# Patient Record
Sex: Male | Born: 1935 | Race: Black or African American | Hispanic: No | Smoking: Former smoker
Health system: Southern US, Community
[De-identification: ages and names within clinical notes are randomized; demographics above are authoritative.]

## PROBLEM LIST (undated history)

## (undated) DIAGNOSIS — L0390 Cellulitis, unspecified: Secondary | ICD-10-CM

## (undated) DIAGNOSIS — I639 Cerebral infarction, unspecified: Secondary | ICD-10-CM

## (undated) DIAGNOSIS — F0390 Unspecified dementia without behavioral disturbance: Secondary | ICD-10-CM

## (undated) DIAGNOSIS — C61 Malignant neoplasm of prostate: Secondary | ICD-10-CM

## (undated) DIAGNOSIS — M1990 Unspecified osteoarthritis, unspecified site: Secondary | ICD-10-CM

## (undated) DIAGNOSIS — I10 Essential (primary) hypertension: Secondary | ICD-10-CM

## (undated) HISTORY — PX: NECK SURGERY: SHX720

## (undated) HISTORY — PX: OTHER SURGICAL HISTORY: SHX169

## (undated) HISTORY — PX: FOOT SURGERY: SHX648

---

## 1999-05-28 ENCOUNTER — Encounter: Payer: Self-pay | Admitting: Neurosurgery

## 1999-05-29 ENCOUNTER — Inpatient Hospital Stay (HOSPITAL_COMMUNITY): Admission: RE | Admit: 1999-05-29 | Discharge: 1999-05-31 | Payer: Self-pay | Admitting: Neurosurgery

## 1999-05-29 ENCOUNTER — Encounter: Payer: Self-pay | Admitting: Neurosurgery

## 1999-05-29 HISTORY — PX: ANTERIOR CERVICAL DECOMP/DISCECTOMY FUSION: SHX1161

## 1999-05-31 ENCOUNTER — Inpatient Hospital Stay (HOSPITAL_COMMUNITY)
Admission: RE | Admit: 1999-05-31 | Discharge: 1999-06-11 | Payer: Self-pay | Admitting: Physical Medicine and Rehabilitation

## 1999-07-15 ENCOUNTER — Encounter: Admission: RE | Admit: 1999-07-15 | Discharge: 1999-07-15 | Payer: Self-pay | Admitting: Neurosurgery

## 1999-07-15 ENCOUNTER — Encounter: Payer: Self-pay | Admitting: Neurosurgery

## 1999-08-07 ENCOUNTER — Encounter: Admission: RE | Admit: 1999-08-07 | Discharge: 1999-09-13 | Payer: Self-pay | Admitting: Neurosurgery

## 1999-08-29 ENCOUNTER — Encounter: Payer: Self-pay | Admitting: Neurosurgery

## 1999-08-29 ENCOUNTER — Encounter: Admission: RE | Admit: 1999-08-29 | Discharge: 1999-08-29 | Payer: Self-pay | Admitting: Neurosurgery

## 1999-09-27 ENCOUNTER — Ambulatory Visit (HOSPITAL_COMMUNITY): Admission: RE | Admit: 1999-09-27 | Discharge: 1999-09-27 | Payer: Self-pay | Admitting: Neurosurgery

## 1999-09-27 HISTORY — PX: CARPAL TUNNEL RELEASE: SHX101

## 1999-12-08 ENCOUNTER — Emergency Department (HOSPITAL_COMMUNITY): Admission: EM | Admit: 1999-12-08 | Discharge: 1999-12-08 | Payer: Self-pay | Admitting: Emergency Medicine

## 2000-08-12 ENCOUNTER — Ambulatory Visit (HOSPITAL_BASED_OUTPATIENT_CLINIC_OR_DEPARTMENT_OTHER): Admission: RE | Admit: 2000-08-12 | Discharge: 2000-08-12 | Payer: Self-pay | Admitting: *Deleted

## 2000-08-12 HISTORY — PX: CARPAL TUNNEL RELEASE: SHX101

## 2002-01-13 ENCOUNTER — Ambulatory Visit (HOSPITAL_COMMUNITY): Admission: RE | Admit: 2002-01-13 | Discharge: 2002-01-13 | Payer: Self-pay | Admitting: *Deleted

## 2002-01-13 ENCOUNTER — Encounter (INDEPENDENT_AMBULATORY_CARE_PROVIDER_SITE_OTHER): Payer: Self-pay | Admitting: Specialist

## 2002-01-19 ENCOUNTER — Encounter: Payer: Self-pay | Admitting: Internal Medicine

## 2002-01-19 ENCOUNTER — Encounter: Admission: RE | Admit: 2002-01-19 | Discharge: 2002-01-19 | Payer: Self-pay | Admitting: Internal Medicine

## 2002-06-22 ENCOUNTER — Encounter: Payer: Self-pay | Admitting: Emergency Medicine

## 2002-06-22 ENCOUNTER — Emergency Department (HOSPITAL_COMMUNITY): Admission: EM | Admit: 2002-06-22 | Discharge: 2002-06-22 | Payer: Self-pay | Admitting: Emergency Medicine

## 2003-09-27 ENCOUNTER — Ambulatory Visit (HOSPITAL_BASED_OUTPATIENT_CLINIC_OR_DEPARTMENT_OTHER): Admission: RE | Admit: 2003-09-27 | Discharge: 2003-09-27 | Payer: Self-pay | Admitting: Orthopedic Surgery

## 2005-01-23 ENCOUNTER — Encounter: Admission: RE | Admit: 2005-01-23 | Discharge: 2005-01-23 | Payer: Self-pay | Admitting: Internal Medicine

## 2005-05-13 ENCOUNTER — Ambulatory Visit (HOSPITAL_COMMUNITY): Admission: RE | Admit: 2005-05-13 | Discharge: 2005-05-13 | Payer: Self-pay | Admitting: Urology

## 2005-06-13 ENCOUNTER — Ambulatory Visit: Admission: RE | Admit: 2005-06-13 | Discharge: 2005-07-18 | Payer: Self-pay | Admitting: Radiation Oncology

## 2005-06-24 ENCOUNTER — Ambulatory Visit (HOSPITAL_COMMUNITY): Admission: RE | Admit: 2005-06-24 | Discharge: 2005-06-24 | Payer: Self-pay | Admitting: Radiation Oncology

## 2005-07-15 ENCOUNTER — Encounter (INDEPENDENT_AMBULATORY_CARE_PROVIDER_SITE_OTHER): Payer: Self-pay | Admitting: *Deleted

## 2005-07-15 ENCOUNTER — Ambulatory Visit (HOSPITAL_COMMUNITY): Admission: RE | Admit: 2005-07-15 | Discharge: 2005-07-15 | Payer: Self-pay | Admitting: Otolaryngology

## 2005-07-21 ENCOUNTER — Ambulatory Visit: Admission: RE | Admit: 2005-07-21 | Discharge: 2005-10-19 | Payer: Self-pay | Admitting: Radiation Oncology

## 2005-09-02 HISTORY — PX: INSERTION PROSTATE RADIATION SEED: SUR718

## 2005-09-08 ENCOUNTER — Ambulatory Visit (HOSPITAL_BASED_OUTPATIENT_CLINIC_OR_DEPARTMENT_OTHER): Admission: RE | Admit: 2005-09-08 | Discharge: 2005-09-08 | Payer: Self-pay | Admitting: Urology

## 2005-09-09 ENCOUNTER — Emergency Department (HOSPITAL_COMMUNITY): Admission: EM | Admit: 2005-09-09 | Discharge: 2005-09-09 | Payer: Self-pay | Admitting: Emergency Medicine

## 2005-09-27 ENCOUNTER — Encounter: Admission: RE | Admit: 2005-09-27 | Discharge: 2005-09-27 | Payer: Self-pay | Admitting: Internal Medicine

## 2006-05-08 ENCOUNTER — Encounter: Admission: RE | Admit: 2006-05-08 | Discharge: 2006-07-23 | Payer: Self-pay | Admitting: Neurology

## 2006-10-20 ENCOUNTER — Encounter
Admission: RE | Admit: 2006-10-20 | Discharge: 2006-10-20 | Payer: Self-pay | Admitting: Physical Medicine and Rehabilitation

## 2008-09-04 ENCOUNTER — Inpatient Hospital Stay (HOSPITAL_COMMUNITY): Admission: EM | Admit: 2008-09-04 | Discharge: 2008-09-08 | Payer: Self-pay | Admitting: Emergency Medicine

## 2008-09-04 ENCOUNTER — Ambulatory Visit: Payer: Self-pay | Admitting: Cardiology

## 2008-09-05 ENCOUNTER — Encounter (INDEPENDENT_AMBULATORY_CARE_PROVIDER_SITE_OTHER): Payer: Self-pay | Admitting: *Deleted

## 2008-09-05 ENCOUNTER — Ambulatory Visit: Payer: Self-pay | Admitting: Vascular Surgery

## 2008-09-06 ENCOUNTER — Encounter (INDEPENDENT_AMBULATORY_CARE_PROVIDER_SITE_OTHER): Payer: Self-pay | Admitting: *Deleted

## 2008-09-20 ENCOUNTER — Encounter: Payer: Self-pay | Admitting: Internal Medicine

## 2008-09-20 ENCOUNTER — Ambulatory Visit: Payer: Self-pay

## 2008-09-28 DIAGNOSIS — I10 Essential (primary) hypertension: Secondary | ICD-10-CM | POA: Insufficient documentation

## 2008-09-28 DIAGNOSIS — Z8546 Personal history of malignant neoplasm of prostate: Secondary | ICD-10-CM

## 2008-09-29 ENCOUNTER — Ambulatory Visit: Payer: Self-pay | Admitting: Internal Medicine

## 2008-09-29 DIAGNOSIS — R5381 Other malaise: Secondary | ICD-10-CM

## 2008-09-29 DIAGNOSIS — R5383 Other fatigue: Secondary | ICD-10-CM

## 2008-09-29 DIAGNOSIS — I498 Other specified cardiac arrhythmias: Secondary | ICD-10-CM

## 2008-10-03 LAB — CONVERTED CEMR LAB
Calcium: 8.6 mg/dL (ref 8.4–10.5)
GFR calc non Af Amer: 75.54 mL/min (ref >60)
Glucose, Bld: 89 mg/dL (ref 70–99)

## 2008-10-10 ENCOUNTER — Ambulatory Visit (HOSPITAL_BASED_OUTPATIENT_CLINIC_OR_DEPARTMENT_OTHER): Admission: RE | Admit: 2008-10-10 | Discharge: 2008-10-10 | Payer: Self-pay | Admitting: Internal Medicine

## 2008-10-10 ENCOUNTER — Encounter: Payer: Self-pay | Admitting: Pulmonary Disease

## 2008-10-24 ENCOUNTER — Ambulatory Visit: Payer: Self-pay | Admitting: Pulmonary Disease

## 2008-11-16 ENCOUNTER — Telehealth (INDEPENDENT_AMBULATORY_CARE_PROVIDER_SITE_OTHER): Payer: Self-pay | Admitting: *Deleted

## 2008-11-16 DIAGNOSIS — G4730 Sleep apnea, unspecified: Secondary | ICD-10-CM | POA: Insufficient documentation

## 2010-05-25 ENCOUNTER — Encounter: Payer: Self-pay | Admitting: Internal Medicine

## 2010-08-13 LAB — CBC
HCT: 36.9 % — ABNORMAL LOW (ref 39.0–52.0)
HCT: 40.1 % (ref 39.0–52.0)
Hemoglobin: 12.1 g/dL — ABNORMAL LOW (ref 13.0–17.0)
MCHC: 32.8 g/dL (ref 30.0–36.0)
MCHC: 32.9 g/dL (ref 30.0–36.0)
MCV: 79.3 fL (ref 78.0–100.0)
MCV: 79.4 fL (ref 78.0–100.0)
Platelets: 160 10*3/uL (ref 150–400)
RBC: 4.65 MIL/uL (ref 4.22–5.81)
RBC: 5.05 MIL/uL (ref 4.22–5.81)
RDW: 18.7 % — ABNORMAL HIGH (ref 11.5–15.5)
WBC: 5.3 10*3/uL (ref 4.0–10.5)

## 2010-08-13 LAB — BASIC METABOLIC PANEL
BUN: 10 mg/dL (ref 6–23)
BUN: 7 mg/dL (ref 6–23)
CO2: 27 mEq/L (ref 19–32)
Chloride: 104 mEq/L (ref 96–112)
Chloride: 106 mEq/L (ref 96–112)
Creatinine, Ser: 1.14 mg/dL (ref 0.4–1.5)
GFR calc Af Amer: >60 mL/min (ref >60)
Glucose, Bld: 108 mg/dL — ABNORMAL HIGH (ref 70–99)
Potassium: 3.7 mEq/L (ref 3.5–5.1)
Potassium: 3.8 mEq/L (ref 3.5–5.1)

## 2010-08-13 LAB — COMPREHENSIVE METABOLIC PANEL
ALT: 9 U/L (ref 0–53)
AST: 12 U/L (ref 0–37)
Albumin: 2.9 g/dL — ABNORMAL LOW (ref 3.5–5.2)
Alkaline Phosphatase: 61 U/L (ref 39–117)
BUN: 11 mg/dL (ref 6–23)
CO2: 26 mEq/L (ref 19–32)
Calcium: 8.1 mg/dL — ABNORMAL LOW (ref 8.4–10.5)
Chloride: 103 mEq/L (ref 96–112)
Creatinine, Ser: 1.17 mg/dL (ref 0.4–1.5)
GFR calc Af Amer: >60 mL/min (ref >60)
GFR calc non Af Amer: >60 mL/min (ref >60)
Glucose, Bld: 103 mg/dL — ABNORMAL HIGH (ref 70–99)
Potassium: 3.5 mEq/L (ref 3.5–5.1)
Sodium: 135 mEq/L (ref 135–145)
Total Bilirubin: 0.9 mg/dL (ref 0.3–1.2)
Total Protein: 6.5 g/dL (ref 6.0–8.3)

## 2010-08-13 LAB — URINALYSIS, ROUTINE W REFLEX MICROSCOPIC
Bilirubin Urine: NEGATIVE
Glucose, UA: NEGATIVE mg/dL
Hgb urine dipstick: NEGATIVE
Ketones, ur: NEGATIVE mg/dL
Nitrite: NEGATIVE
Protein, ur: NEGATIVE mg/dL
Specific Gravity, Urine: 1.014 (ref 1.005–1.030)
Urobilinogen, UA: 1 mg/dL (ref 0.0–1.0)
pH: 6 (ref 5.0–8.0)

## 2010-08-13 LAB — DIFFERENTIAL
Basophils Absolute: 0.1 10*3/uL (ref 0.0–0.1)
Basophils Relative: 1 % (ref 0–1)
Eosinophils Absolute: 0.1 10*3/uL (ref 0.0–0.7)
Eosinophils Relative: 2 % (ref 0–5)
Lymphocytes Relative: 39 % (ref 12–46)
Lymphs Abs: 2.1 10*3/uL (ref 0.7–4.0)
Monocytes Absolute: 0.4 10*3/uL (ref 0.1–1.0)
Monocytes Relative: 8 % (ref 3–12)
Neutro Abs: 2.7 10*3/uL (ref 1.7–7.7)
Neutrophils Relative %: 50 % (ref 43–77)

## 2010-08-13 LAB — LIPID PANEL
LDL Cholesterol: 122 mg/dL — ABNORMAL HIGH (ref 0–99)
Total CHOL/HDL Ratio: 5.4 RATIO
Triglycerides: 57 mg/dL (ref <150)
VLDL: 11 mg/dL (ref 0–40)

## 2010-08-13 LAB — CARDIAC PANEL(CRET KIN+CKTOT+MB+TROPI): Relative Index: INVALID (ref 0.0–2.5)

## 2010-08-13 LAB — TSH: TSH: 2.263 u[IU]/mL (ref 0.350–4.500)

## 2010-08-13 LAB — CK TOTAL AND CKMB (NOT AT ARMC): Total CK: 51 U/L (ref 7–232)

## 2010-08-13 LAB — APTT: aPTT: 43 seconds — ABNORMAL HIGH (ref 24–37)

## 2010-08-13 LAB — TROPONIN I: Troponin I: 0.01 ng/mL (ref 0.00–0.06)

## 2010-08-13 LAB — PHOSPHORUS: Phosphorus: 3 mg/dL (ref 2.3–4.6)

## 2010-08-13 LAB — BRAIN NATRIURETIC PEPTIDE: Pro B Natriuretic peptide (BNP): 92 pg/mL (ref 0.0–100.0)

## 2010-09-17 NOTE — Discharge Summary (Signed)
NAMERASHIDI, LOH NO.:  [id number]   MEDICAL RECORD NO.:  [id number]          PATIENT TYPE:  INP   LOCATION:  3710                         FACILITY:  [facility]   PHYSICIAN:  Altha Harm, MDDATE OF BIRTH:  10-Jan-1930   DATE OF ADMISSION:  09/04/2008  DATE OF DISCHARGE:  09/08/2008                               DISCHARGE SUMMARY   DISCHARGE DISPOSITION:  Home.   FINAL DISCHARGE DIAGNOSES:  1. Generalized weakness, improved.  2. Uncontrolled hypertension, improving.  3. Bradycardia - likely medication-induced.  4. History of cerebrovascular accident with left-sided weakness in the      past.  5. Gait disturbance.  6. Neuropathy.  7. Benign prostatic hypertrophy.  8. History of prostate cancer.  9. Hyperlipidemia - specifically hypertriglyceridemia.  10.History of brown recluse spider bite in the past.  11.Questionable transient ischemic attack.   DISCHARGE MEDICATIONS:  1. Xanax 0.5 mg p.o. q.8 h.  2. Lisinopril 10 mg p.o. daily.  3. Hydrochlorothiazide 12.5 mg p.o. daily.  4. Norvasc 5 mg p.o. daily  5. Pravastatin 20 mg p.o. q.h.s.  6. Detrol 4 mg p.o. daily.  7. Oxybutynin 10 mg p.o. t.i.d.  8. Gabapentin 800 mg p.o. t.i.d.  9. Aspirin 81 mg p.o. daily.  10.Xanax 0.5 mg p.o. q.8 h.   Discontinued medications include the following:  1. Tenex 2 mg p.o. q.h.s.  2. Metoprolol 50 mg p.o. daily.  3. Tranxene 7.5 mg p.o. daily.   CONSULTANTS:  Dr. Dietrich Pates, cardiology.   PROCEDURES:  None.   DIAGNOSTIC STUDIES:  1. Chest x-ray 2-view done on admission which shows mild cardiomegaly      and tortuous aorta.  No active disease in 1 view.  2. CT of the head without contrast done on admission, which shows no      bleed or mass effect.  The calvarium was intact.  No fluid or      sinuses visualized.  Microvascular white matter changes, several      areas of infarction in the right basil ganglia.  Impression:  No      definite acute findings,  although we have no prior studies      available for comparison.  3. MRI of the brain without contrast, which shows no acute infarct.      Remote right basal ganglia chronic infarct with mild dilatation of      the adjacent ventricle.  Impression:  Moderate small vessel disease-      type changes.  4. MRA of the brain, which shows moderate intracranial atherosclerotic      changes.  5. Carotid duplex, which shows no ICA stenosis.  6. A 2-D echocardiogram, which shows:      a.     Preserved ejection fraction in the range of 60-65% with       increased wall thickness, increased in a pattern of moderate left       ventricular hypertrophy.  Doppler findings consistent with grade 1       systolic dysfunction.      b.     Mitral valve  mildly thickened leaflets.      c.     Left atrium mildly dilated.      d.     Right atrium appears to be extrinsically compressed.      e.     Tricuspid valve with mild regurgitation.      f.     Trivial pericardial effusion identified.   ALLERGIES:  No known drug allergies.   CODE STATUS:  Full code.   PRIMARY CARE PHYSICIAN:  [name], M.D.   CHIEF COMPLAINT:  Generalized weakness.   HISTORY OF PRESENT ILLNESS:  Please refer to the H and P by Dr.  Kathryne Hitch for details of the HPI.  However, in short, this is a  gentleman who according to his family was having progressive generalized  weakness over several weeks.  The patient's daughter stated that while  at home she observed the patient to be looking rather depressed in his  activities and when checking his pulse, she was unable to palpate a  pulse and noted that the blood pressure was elevated.  She called EMS  and the patient was transported to the hospital, where he was admitted  for the same.   HOSPITAL COURSE:  1. Generalized weakness.  The patient had some generalized weakness      and it was difficult to ascertain whether or not he was having some      one-sided weakness given the  fact that the patient does have some      residual weakness from a prior stroke.  He was observed and was not      found to have any new focal deficits and, in fact, radiologic      studies showed no evidence of an acute infarction.  The patient was      also found to be bradycardiac and his rate-limiting medications      were discontinued at that time.  The patient did return to his      baseline of function and with the discontinuation of his rate-      limiting medications.  It was felt that his weakness was likely      caused,  likely the combination of the bradycardia in conjunction      with the systemic defects of the beta and alpha-adrenergic      blockers.  The patient was seen by physical therapy, who felt that      the patient could benefit from ongoing therapy at home.  The      patient states that prior to coming to the hospital he had been      using a cane, however felt that the support he was receiving from      the cane was inadequate.  Here in the hospital the physical      therapist had recommended a rolling walker present and feels that      the patient can likely with therapy gradually to the use of a cane      as an appropriate assistive device.  2. Bradycardia.  As noted above, the patient was found to bradycardic      down into the 30s.  Even with the discontinuation of the beta      blocker and the alpha-adrenergic blocker, the patient still had      heart rates down into the 30s.  Dr. Dietrich Pates from cardiology saw      the patient in consultation and felt that there was no  further      intervention needed at this time except for the discontinuation of      those medications.  Over a period of several days the patient's      heart rate improved up into the 50s to 70s.  A 2-D echocardiogram      was done and showed no structural defects.  The recommendation from      Dr. Tenny Craw was that the patient be seen in consultation for possible      sleep study as an  outpatient.  The patient has an appointment to      follow up with her.  3. Uncontrolled hypertension.  The patient had high blood pressure in      the ranges of 200 systolic to 100 diastolic.  The patient was      started on a new regimen of medications to include lisinopril,      hydrochlorothiazide and Norvasc.  The patient has never been      investigated for secondary causes of his hypertension and will be      seen by Dr. Tenny Craw as an outpatient for renal artery stenosis      evaluation.  If this proves to be negative, a sleep study will be      performed to further evaluate the patient.  With this regimen of      medications the patient has had a moderate decrease in his blood      pressure down into the range of 170s over 90s, and the patient will      likely require further titration as an outpatient.  I would, of      course, allow an acceptable period of time for maximal effect of      these blood pressure medications before titrating the medications      again.  4. Hyperlipidemia.  The patient was found to be hyperlipidemic with      specifically hypertriglyceridemia.  The patient was started on      Zocor in the hospital and is being sent home on pravastatin as an      outpatient.  5. History of coronary artery disease.  The patient has been placed on      an aspirin and statin for independent beneficial effects for a      patient with coronary artery disease.  The patient does not have a      history of a myocardial infarction, and his beta blocker has been      discontinued due to the bradycardia and the effects it has had on      him, both systemically and with his heart rates.  6. Questionable benign prostatic hypertrophy.  The patient is on      oxybutynin and Detrol.  7. Questionable transient ischemic attack.  It is unclear as to      whether or not the patient actually had focal symptoms initially      which resolved or this was just generalized weakness which  improved      as the patient was taken off of the beta blocker.  Nevertheless,      the patient has been risk-modified and did not show any      radiological evidence of a new stroke.  8. Anxiety.  The patient had been on Tranxene for treatment of his      anxiety.  However, he endorsed that it was cost-prohibitive and he      could  not afford the medication.  Thus, Xanax 0.5 mg p.o. t.i.d.      has been substituted for the Tranxene.   DIETARY RESTRICTIONS:  The patient should be on a heart-healthy diet.   PHYSICAL RESTRICTIONS:  The patient should ambulate with a walker and  will receive home health physical therapy as an outpatient.   FOLLOWUP:  The patient is to follow up with Dr. Nicholos Johns in  approximately 1-2 weeks.  The patient also has a followup appointment  scheduled with Dr. Dietrich Pates for 5/28 at 9:45 a.m., and he has an  appointment scheduled for a renal ultrasound to be done on 5/19 at 10  a.m.   TOTAL TIME FOR DISCHARGE PROCESS:  1 hour.      Altha Harm, MD  Electronically Signed     MAM/MEDQ  D:  09/08/2008  T:  09/08/2008  Job:  [id number]

## 2010-09-17 NOTE — Procedures (Signed)
NAMECREG, GILMER NO.:  [id number]   MEDICAL RECORD NO.:  [id number]          PATIENT TYPE:  OUT   LOCATION:  SLEEP CENTER                 FACILITY:  [facility]   PHYSICIAN:  Barbaraann Share, MD,FCCPDATE OF BIRTH:  11[birth year]14[birth year]1931   DATE OF STUDY:  10/10/2008                            NOCTURNAL POLYSOMNOGRAM   REFERRING PHYSICIAN:  [name], MD, [facility]   REFERRING PHYSICIAN:  [name], MD, [facility]   INDICATIONS FOR STUDY:  Hypersomnia with sleep apnea.   EPWORTH SCORE:  9.   SLEEP ARCHITECTURE:  The patient had a total sleep time of 119 minutes  with no slow wave sleep and only 21 minutes of REM.  Sleep onset latency  was normal at 16 minutes, and REM onset was prolonged at 160 minutes.  Sleep efficiency was very poor at 33 minutes.   RESPIRATORY DATA:  The patient was found to have 28 obstructive apneas  and 34 obstructive hypopneas giving him an apnea/hypopnea index of 31  events per hour.  All of the events occurred in the supine position, and  there was moderate snoring noted throughout.  The patient did not meet  split night criteria since most of his events occurred after midnight,  and there was not enough time for appropriate CPAP titration.   OXYGEN DATA:  There was O2 desaturation as low as 82% with the patient's  obstructive events.   CARDIAC DATA:  Rare PAC and PVC, but no clinically significant  arrhythmia seen.   MOVEMENT/PARASOMNIA:  The patient was found to have 104 periodic leg  movements, but only 2 resulted in arousal or awakening.  More than  likely, his leg jerks are due to his sleep disordered breathing.   IMPRESSION/RECOMMENDATIONS:  1. Moderate obstructive sleep apnea/hypopnea syndrome with an AHI of      31 events per hour and oxygen desaturation as low as 82%.  The      patient did not meet split night criteria secondary to most of his      events occurring after midnight.  Treatment for this degree of      sleep apnea can  include a trial of weight loss alone if      applicable, upper airway surgery, dental appliance, and also CPAP.      Clinical correlation is suggested.  2. Rare PAC and PVC with no clinically significant arrhythmia seen.      Barbaraann Share, MD,FCCP  Diplomate, American Board of Sleep  Medicine  Electronically Signed     KMC/MEDQ  D:  10/24/2008 15:42:34  T:  10/25/2008 04:19:18  Job:  [id number]

## 2010-09-17 NOTE — Consult Note (Signed)
Manuel King, Manuel King NO.:  [id number]   MEDICAL RECORD NO.:  [id number]          PATIENT TYPE:  INP   LOCATION:  3710                         FACILITY:  [facility]   PHYSICIAN:  [name], MD, FACCDATE OF BIRTH:  02/03/1930   DATE OF CONSULTATION:  09/07/2008  DATE OF DISCHARGE:                                 CONSULTATION   PRIMARY CARE PHYSICIAN:  [name], MD   PRIMARY CARDIOLOGIST:  New, [name], MD, [facility]   REQUESTING PHYSICIAN:  Dr. Ashley Royalty of [facility].   REASON FOR CONSULTATION:  Bradycardia.   HISTORY OF PRESENT ILLNESS:  A 77 year old African American male with  known history of CVA, hyperlipidemia admitted secondary to generalized  weakness.  The patient states he just could not get out of bed.  He felt  weakness all over, especially in his legs.  The patient's family brought  him to the emergency room.  He was found to be very hypertensive with  blood pressures in the 190 systolic.  The patient was admitted.  He was  diagnosed with a TIA.  He did have a CT scan of his head that showed no  acute infarct.  However, he had a history of remote infarct and moderate  atherosclerosis.  The patient was also found to be bradycardic with a  heart rate in his 30s.  His primary care physician discontinued  metoprolol and Tenex, and we are asked to evaluate further for any more  recommendations.  For blood pressure control, he is now been placed on  Altace 5 mg daily and hydrochlorothiazide 12.5 mg daily.  The patient  denies any syncope, chest pain, or shortness of breath.  He admits that  his hypertension has been difficult to control as an outpatient.  Dr.  Brunilda Payor, his oncologist has been adjusting his medications as well as he  has a history of prostate cancer and has recently placed him on the  Tenex.  The patient states that with several different medication  regimens and none of them seem to have helped his blood pressure, he  has  been medically compliant.   REVIEW OF SYSTEMS:  Positive for generalized weakness, lightheadedness  when he stands up too quickly.  He was unable to stand secondary to the  weakness.  All other systems are reviewed and found to be negative.   PAST MEDICAL HISTORY:  1. Hypertension for 13 years.  2. Hypertriglyceridemia.  3. Prostate cancer, being treated by Dr. Brunilda Payor.  4. History of a CVA in 1997 with left-sided weakness.  5. He has a history of being bitten by a brown recluse spider.  6. He has chronic back pain.  7. He was diagnosed with a parotid gland Warthin tumor.   Echocardiogram dated May 2010 revealing EF of 60%, LVH with grade 1  diastolic dysfunction, and mild MV bowing.  IVPN 12 and IVS 17.   PAST SURGICAL HISTORY:  Back surgery, carpal tunnel bilaterally, foot  surgery, and right cataract surgery.   SOCIAL HISTORY:  He lives in [place] alone.  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  He is divorced with 5 children.  He does not smoke.  He  does not drink.  There is no use of drug use.   FAMILY HISTORY:  Mother deceased with CHF at age 24.  Father deceased  from an MI at age 15.  He has 4 brothers and 5 sisters.  He is uncertain  about their history.   CURRENT MEDICATIONS:  1. Clorazepate 7.5 mg p.r.n.  2. Gabapentin 800 mg t.i.d.  3. Detrol 4 mg daily.  4. Oxybutynin 10 mg t.i.d.  5. Altace 5 mg daily.  6. Hydrochlorothiazide 12.5 mg daily for blood pressure control.   ALLERGIES:  No known drug allergies.   CURRENT LABORATORY DATA:  Hemoglobin 12.1, hematocrit 36.9, white blood  cells 5.3, and platelets 160.  Sodium 136, potassium 3.7, chloride 104,  CO2 24, BUN 7, creatinine 0.98, and glucose 108.  Troponin 0.01 and 0.2.  Magnesium 1.9.  Phosphorus 3.0.  PTT 43, PT 15.6, and INR 1.1.  Chest x-  ray revealing mild cardiomegaly.  No active disease.  CT, no acute  finding.  Several areas  of infarction in the right basal ganglia.  EKG  revealing sinus bradycardia, rate of 44 beats per minute with an  hypertrophy.  No evidence of ischemia.   PHYSICAL EXAMINATION:  VITAL SIGNS:  Blood pressure 192/91 (with  systolic blood pressure running between 140 and 192), heart rate 55,  respirations 18, temperature 97.9, and O2 sat 99% on room air.  GENERAL:  He is awake, alert, oriented with a good affect.  HEENT:  Head is normocephalic and atraumatic.  The right eye droops and  closes on its own, difficult to keep his eye open.  His dentition is  poor.  NECK:  Supple with no bruit.  Mild JVD to the chin.  CARDIOVASCULAR:  Bradycardic with a soft S4.  Pulses are 2+ and equal  without bruits.  LUNGS:  Have expiratory wheezes.  ABDOMEN:  Soft, nontender with 2+ bowel sounds.  No hepatomegaly is  noted.  EXTREMITIES:  Without clubbing, cyanosis, or edema.  NEURO:  Left-sided weakness with right eyelid drop, otherwise intact.   IMPRESSION:  1. Asymptomatic bradycardia.  2. Generalized weakness.  3. Transient ischemic attack.  4. History of hypertension, not well controlled on current      medications.  5. History of cerebrovascular accident with left-sided weakness.  6. Prostate carcinoma.   PLAN:  This is a 77 year old African American male with known history of  CVAs and longstanding difficult to control hypertension who was admitted  with generalized weakness and a TIA.  He was noted to be bradycardic  with a heart rate in the 30s.  Echocardiogram was completed revealing  diastolic dysfunction with some mild wall thickening.  The patient has  had his metoprolol and Tenex stopped.  We agree with this as both can  cause bradycardia.   The patient has been seen and examined by myself and Dr. Dietrich Pates.  The bradycardia is not hemodynamically compromising.  It is narrow  complex.  The patient still has hypertension.  Okay with [place] to  discontinue the medications Tenex and  Toprol and follow.  Adding Altace  and hydrochlorothiazide is agreed with and we will add Norvasc 5 mg as  well.  We do not believe that his weakness is due to the bradycardia and  not effect  his treatment.  Tenex started 1 week ago, may have been  contributing.   His hypertension has not been well controlled and it has been adjusted  by primary and his oncologist.  We will plan a renal ultrasound in our  office to rule out renal artery stenosis.  He has a history of snoring  and may need a sleep study if renal ultrasound is negative.   We will add aspirin and increase the Zocor to 40 mg daily.  There is no  cardiac reason for him to stay in the hospital from our standpoint.  We  will arrange a followup visit in our office for the renal artery  ultrasound and a followup appointment with Dr. Tenny Craw.   Renal artery ultrasound has been scheduled for Sep 20, 2008 at 10:00  a.m. and follow up with Dr. Tenny Craw on Sep 29, 2008 at 9:45 a.m.   On behalf of the physicians and providers of [facility], we  would like to thank Dr. Ashley Royalty for allowing [place] to participate in the  care of this patient.      Bettey Mare. Lyman Bishop, NP      [name], MD, [facility]  Electronically Signed    KML/MEDQ  D:  09/07/2008  T:  09/08/2008  Job:  [id number]   cc:   [name], M.D.

## 2010-09-17 NOTE — H&P (Signed)
Manuel King, AVINO NO.:  [id number]   MEDICAL RECORD NO.:  [id number]          PATIENT TYPE:  INP   LOCATION:  3710                         FACILITY:  [facility]   PHYSICIAN:  Monte Fantasia, MD  DATE OF BIRTH:  [birth year]   DATE OF ADMISSION:  09/04/2008  DATE OF DISCHARGE:                              HISTORY & PHYSICAL   PRIMARY CARE PHYSICIAN:  [name], MD   HISTORY OF PRESENT ILLNESS:  The patient is a 77 year old African  American gentleman.  The patient is being admitted today with chief  complaints of generalized weakness.  The patient was brought in by the  daughter, history obtained from the patient.  As per the patient, he  started feeling generalized weakness and unable to get up from bed last  night at around 1 p.m.  The patient does have a history of stroke prior,  which has affected his left side, however, the weakness on the left side  had increased over the night.  The patient's family reported that the  blood pressure was elevated and the pulse rate was nil, and the patient  was given blood pressure medication prior to leaving the hospital.  The  patient denies of any history of loss of consciousness.  No history of  seizure episode.  No history of urinary incontinence or bowel or bladder  incontinence.  History of stroke in the past with significant history of  hypertension.  No complaints of fevers.  No complaints of pain on  passing urine.  No history of cough or shortness of breath.  No  complaints of chest pains or diaphoresis.   REVIEW OF SYSTEMS:  Review of all systems have been negative other than  those mentioned in the HPI.   PAST MEDICAL HISTORY:  1. Hypertension.  2. Hypertriglyceridemia.  3. Prostate CA.  4. Stroke.  5. Brown recluse spider bite.   PAST SURGICAL HISTORY:  Back surgery, carpal tunnel syndrome surgery,  and foot surgery.   SOCIAL HISTORY:  Denies of any history of smoking, drinking, or any IV  drug use.   ALLERGIES:  No known drug allergies.   MEDICATIONS AT HOME:  1. Tenex 2 mg p.o. nightly.  2. Metoprolol 50 mg p.o. once a day.  3. Clorazepate 7.5 mg p.o. p.r.n.  4. Gabapentin 800 mg p.o. 3 times a day.  5. Detrol 4 mg p.o. daily.  6. Oxybutynin chloride 10 mg p.o. 3 times a day.   TRAVEL HISTORY:  No history of recent travel.   FAMILY HISTORY:  No significant family history.   PHYSICAL EXAMINATION:  VITAL SIGNS:  Temperature of 99.0, blood pressure  of 181/82, heart rate of 42, and respiratory rate of 18.  HEENT:  Pupils equal and reacting to light.  Right-sided angle of the  mouth drooping plus no tongue deviation.  No pallor.  No icterus.  NECK:  Supple.  No JVD.  No carotid bruits heard.  CARDIOVASCULAR:  S1 and S2.  Regular rate and rhythm.  RESPIRATORY:  Air entry is bilaterally equal.  No rales, no rhonchi.  ABDOMEN:  Upper abdomen is soft.  No guarding, no rigidity, no  tenderness.  EXTREMITIES:  No edema of feet.  CNS:  The patient is alert, awake, and oriented x2.  Right upper and  lower extremities power 5/5 in all flexors and extensors.  Left upper  extremity power is 3/5 and lower extremity is 4/5 in all flexors and  extensors.  Deep tendon reflexes are equivocal on both sides.  Plantars  are downgoing.  Sensory, the patient was unable to follow commands.  SKIN:  Intact.  No evidence of any skin rashes or skin breakdown.   LABORATORIES DONE DURING STAY IN THE HOSPITAL:  1. Chest x-ray done on Sep 04, 2008.  Impression:  Mild cardiomegaly      and tortuous aorta.  No active disease.  2. CT of the head without contrast done on Sep 04, 2008.  Impression:      Microvascular white matter changes, several areas of infarction in      the right basal ganglia.  No definite acute findings although we do      not have prior studies available for comparison.   LABORATORIES DONE AT THE TIME OF ADMISSION:  Total WBC 5.3, hemoglobin  12.1, hematocrit 36.9, MCV 79.3,  and platelets of 160.  PT 15.6 and INR  1.2.  Sodium 135, potassium 3.5, chloride 103, bicarb 26, glucose 103,  BUN 11, creatinine 1.17, total bilirubin 0.9, alkaline phosphatase 61,  AST 12, ALT 9, total protein 6.5, albumin 2.9, and calcium 8.1.  Total  CK 51, CK-MB 0.5, and troponin 0.01.   ASSESSMENT:  1. Acute cerebrovascular accident.  2. Hypertension, uncontrolled.  3. Prior history of cerebrovascular accident.  4. Hyperlipidemia.   PLAN:  To admit the patient to [facility] Service Team B.  We will  admit the patient to telemetry bed, monitor for the next 24 hours.  The  patient's CT head is showing no evidence of any acute stroke.  We will  have an MRI of the brain with DWI, 2-D echo, and carotid Dopplers for  the same.  The patient at present is bradycardic, however, the patient  has received metoprolol as one of the home medications.  Would continue  to monitor, have cardiac enzymes x3 sets done.  Hold all home  medications for now.  The CT head does not show any evidence of bleed.  The patient is being started on aspirin 325 mg daily, to hold all  antihypertensive medications at present.  If blood pressure is more than  180/120, would give a p.r.n. hydralazine IV.  The patient needs to have  stroke, speech and swallow screen.  We will have physical therapy and  occupational and rehab consults as an inpatient.  Also, we will  give Zocor 40 mg daily for statins.  We will give gentle IV fluid  hydration of normal saline at 50 mL per hour.  We will have aspiration  precautions.  DVT and GI prophylaxis on Lovenox and Protonix.   Total time for admission of an hour.      Monte Fantasia, MD  Electronically Signed     MP/MEDQ  D:  09/04/2008  T:  09/05/2008  Job:  [id number]

## 2010-09-20 NOTE — Op Note (Signed)
   NAME:  DERRECK, WILTSEY NO.:  [id number]   MEDICAL RECORD NO.:  [id number]                   PATIENT TYPE:  AMB   LOCATION:  ENDO                                 FACILITY:  [facility]   PHYSICIAN:  Georgiana Spinner, M.D.                 DATE OF BIRTH:  March 09, 1930   DATE OF PROCEDURE:  01/13/2002  DATE OF DISCHARGE:                                 OPERATIVE REPORT   PROCEDURE PERFORMED:  Colonoscopy.   ENDOSCOPIST:  Georgiana Spinner, M.D.   INDICATIONS FOR PROCEDURE:  Rectal bleeding.  Hemoccult positivity.  Iron  deficiency anemia.   ANESTHESIA:  Demerol 50 mg, Versed 2 mg additional.   DESCRIPTION OF PROCEDURE:  With the patient mildly sedated in the left  lateral decubitus the Olympus video colonoscope was inserted in the rectum  after normal rectal exam and passed under direct vision to the cecum,  identified by the ileocecal valve and appendiceal orifice, both of which  were photographed.  From this point the colonoscope was slowly withdrawn  taking circumferential views of the entire colonic mucosa stopping only in  the rectum which appeared normal on direct and showed hemorrhoids on  retroflex view.  The endoscope was straightened and withdrawn.  The  patient's vital signs and pulse oximeter remained stable.  The patient  tolerated the procedure well without apparent complications.   FINDINGS:  Internal hemorrhoids.  Otherwise unremarkable examination.   PLAN:  See endoscopy note for further details.                                                 Georgiana Spinner, M.D.    GMO/MEDQ  D:  01/13/2002  T:  01/14/2002  Job:  [id number]

## 2010-09-20 NOTE — H&P (Signed)
[place]. [facility]  Patient:    Manuel King                        MRN: [id number] Adm. Date:  [id number] Attending:  Danella Penton                         History and Physical  HISTORY:  Mr. Scioneaux is a patient who was sent to me by Dr. Tomasa Rand, as an emergency last week because of cervical myelopathy.  This gentleman, in the past, had a right cerebral stroke which left him with a residual left hemiparesis. The patient had been complaining of some discomfort, neck pain, weakness in the right arm, and difficulty walking.  He feels that right now the right now the right arm is getting weaker.  He had difficulty with the right leg and he is losing strength. He also complains of numbness.  The patient had an MRI and EMG and he was sent o [place] as an emergency.  PAST MEDICAL HISTORY:  Stroke and back surgery in 1993.  ALLERGIES:  He is not allergic to any medication.  HABITS:  He smokes five cigarettes a day and he drinks socially.  MEDICATIONS:  He is taking Lasix and some medication for his arthritis.  FAMILY HISTORY:  Unremarkable.  REVIEW OF SYSTEMS:  He complains of swelling of the hands, has a history of high blood pressure, indigestion, incoordination, and anxiety.  PHYSICAL EXAMINATION:  GENERAL:  Patient who came to my office with his wife.  He was walking with a cane. His wife was holding him from the left side.  HEENT:  Normal.  NECK:  He is able to flex but extension and lateralization causes some discomfort. There are no bruits in the carotids.  LUNGS:  Rhonchi bilaterally.  HEART:  Heart sounds normal.  ABDOMEN:  Normal.  EXTREMITIES:  Normal pulses.  NEUROLOGIC:  The patient is oriented x 3.  Cranial nerves are equal and reactive. Visual fields seems to be normal, the face symmetrical.  Swallowing is normal.  Hearing is normal.  He is able to move his shoulder.  In the left upper extremity, I found 3/4  weakness of the left upper extremity, as well as the lower, with some spasticity most likely secondary to the stroke.  On the right side, I found the  deltoids are 2/5, the biceps 2/5, with the wrist extension being 2/5 and the triceps being 3/5.  He has a full grip of the right hand.  Reflexes 1+ with probable Babinski on the left side.  Sensation seems to be normal.  He complains of some tingling in the right hand.  He is walking with short steps.  DIAGNOSTIC STUDIES:  The MRI showed that he has a large herniated disk with compression of the spinal cord, already some changes in the spinal cord at the level of C3-C4.  CLINICAL IMPRESSION:  C3-C4 herniated disk with myelopathy.  Status post stroke.  RECOMMENDATIONS:  The patient is being admitted for surgery.  He and his wife knows about the risks such as paralysis and new stroke all over again, infection, need for further surgery, damage to the focal cord, damage to the esophagus, damage o the trachea. DD:  05/29/99 TD:  05/29/99 Job: [id number]

## 2010-09-20 NOTE — Op Note (Signed)
[name]. [facility]  Patient:    Manuel King                        MRN: [id number] Proc. Date: 05/29/99 Adm. Date:  [id number] Attending:  Danella Penton CC:         Mickie Kay, M.D.                           Operative Report  PREOPERATIVE DIAGNOSIS:  C3 and C4 herniated disk with cervical myelopathy.  POSTOPERATIVE DIAGNOSIS:  C3 and C4 herniated disk with cervical myelopathy.  PROCEDURE:  Anterior C3 and C4 diskectomy, decompression of the spinal cord, bone bank graft, Synthes plate and microscope.  SURGEON:  Tanya Nones. Jeral Fruit, M.D.  ASSISTANT:  Danae Orleans. Venetia Maxon, M.D.  CLINICAL HISTORY:  The patient was seen by me last week because of weakness all  over.  The patient has a history of stroke in the past, and he has some left hemiparesis.  Now, he is complaining of difficulty walking and weakness of the right upper extremity.  X-ray showed a bad case of herniated disk at the level f C3 and C4, producing myelopathy with changes within the spinal cord itself. Surgery was advised.  He and his wife knew of the risks such as infection, paralysis, no improvement whatsoever, damage to the vocal cord, damage to the esophagus, trachea, and _______.  DESCRIPTION OF PROCEDURE:  The patient was taken to the operating room and he was positioned in a supine manner.  Intubation was done in a neutral position. Then, the left side of the neck was prepped with Betadine.  A transverse incision through the skin, platysma, and down to the spine was done.  X-ray showed that indeed we were at the level of C3 and C4.  Then, we opened the anterior ligament.  We brought the microscope and total gross diskectomy was achieved.  We found a thick posterior ligament with some herniated disk going below and under the ligament. Decompression proximal and distal was done.  Foraminotomy was achieved. Hemostasis was done with Gelfoam.  Then, official graft  of 7 mm height was inserted between 3 and 4, and a Synthes plate using four screws was done.  X-ray showed good position of the graft.  From then on, the area was irrigated and closed with Vicryl and Steri-Strips. DD:  05/29/99 TD:  05/30/99 Job: [id number]

## 2010-09-20 NOTE — Op Note (Signed)
NAME:  Manuel King, Manuel King NO.:  [id number]   MEDICAL RECORD NO.:  [id number]                   PATIENT TYPE:  AMB   LOCATION:  DSC                                  FACILITY:  [facility]   PHYSICIAN:  Leonides Grills, M.D.                  DATE OF BIRTH:  December 01, 1929   DATE OF PROCEDURE:  09/27/2003  DATE OF DISCHARGE:                                 OPERATIVE REPORT   PREOPERATIVE DIAGNOSIS:  1. Left medial cuneiform spur.  2. Left first metatarsal spur.   POSTOPERATIVE DIAGNOSIS:  1. Left medial cuneiform spur.  2. Left first metatarsal spur.   OPERATION:  1. Partial excision left medial cuneiform.  2. Partial excision left first metatarsal.   ANESTHESIA:  General endotracheal anesthesia.   SURGEON:  Leonides Grills, M.D.   ASSISTANT:  None.   COMPLICATIONS:  None.   DISPOSITION:  Stable to the PR.   INDICATIONS FOR PROCEDURE:  This is a 77 year old male who has had long-  standing progressive dorsal and medial mid foot pain that was interfering  with his life due to a spur over the dorsal aspect of his foot.  All risks  which include infection, neurovascular injury, recurrence of the spur,  persistent pain, worsening pain, stiffness, arthritis of the first tarsal  metatarsal joint were all explained, questions were encouraged and answered.   OPERATION:  The patient was brought to the operating room and placed in  supine position.  After adequate general endotracheal anesthesia was  administered as well as Ancef 1 gram IV piggyback, the left lower extremity  was then prepped and draped in a sterile manner over a proximally placed  thigh tourniquet.  The limb was gravity exsanguinated and the tourniquet was  elevated to 290 mmHg.  A longitudinal incision lateral to the EHL tendon was  then made.  Dissection was carried down through the skin.  The spur was  identified immediately.  This was then meticulously dissected out with a 15  blade scalpel on  either side.  The medial cuneiform was removed with a  curved 1/4 inch osteotome as well as the spur on the base of the first  metatarsal.  Once this was removed, this was rounded off with the rongeur  flush.  This was palpated through the skin and it was adequately  decompressed.  The wound  was copiously irrigated with normal saline.  The tourniquet was deflated,  hemostasis was obtained.  The subcu was closed with 3-0 Vicryl and the skin  was closed with 4-0 nylon.  A sterile dressing was applied.  The patient was  stable to the PR.  Leonides Grills, M.D.    PB/MEDQ  D:  09/27/2003  T:  09/27/2003  Job:  [id number]

## 2010-09-20 NOTE — Op Note (Signed)
Manuel King, Manuel King                ACCOUNT NO.:  [id number]   MEDICAL RECORD NO.:  [id number]          PATIENT TYPE:  AMB   LOCATION:  NESC                         FACILITY:  [facility]   PHYSICIAN:  Lindaann Slough, M.D.  DATE OF BIRTH:  1929/10/09   DATE OF PROCEDURE:  09/08/2005  DATE OF DISCHARGE:  09/09/2005                                 OPERATIVE REPORT   PREOPERATIVE DIAGNOSIS:  Adenocarcinoma of prostate.   POSTOPERATIVE DIAGNOSIS:  Adenocarcinoma of prostate.   PROCEDURE:  I-125 seeds implantation and cystoscopy.   SURGEONS:  1.  Danae Chen, M.D.  2.  Maryln Gottron, M.D.   ANESTHESIA:  General.   INDICATIONS:  The patient is a 77 year old male with Gleason score 6 and 7  prostate cancer.  He chose to have brachytherapy.  He is scheduled at today  for the procedure.   Under general anesthesia, the patient was prepped and draped and placed in  the dorsal lithotomy position.  The transducer was then inserted in the  rectum, then the grid was attached to the transducer,  Two anchor needles  were then placed in the prostate and ultrasound planning was done by Dr.  Dayton Scrape.  Then with the Nucletron robot, a total of 20 needles were used to  insert 64 seeds in the prostate.  The total activity is 23.93 mCi.  There  appears to be good seed distribution by x-ray.   Then the Foley catheter that was previously placed in the bladder was  removed.  A flexible cystoscope was inserted in the bladder.  The anterior  urethra is normal.  There is moderate prostatic hypertrophy.  There are 2  seeds in the bladder.  There is no stone or tumor in the bladder.  The  ureteral orifices are in normal position and shape with clear  efflux.  The seeds were then grasped with a grasping forceps and removed.  There is no evidence of remaining seeds in the bladder.  The cystoscope was  then removed.  The Foley catheter was then reinserted in the bladder.   The patient tolerated the procedure  well and left the OR in satisfactory  condition to post-anesthesia care unit.      Lindaann Slough, M.D.  Electronically Signed     MN/MEDQ  D:  09/08/2005  T:  09/09/2005  Job:  [id number]   cc:   Maryln Gottron, M.D.  Fax: [phone]   [name], M.D.  Fax: [phone]

## 2010-09-20 NOTE — Op Note (Signed)
   NAME:  CURTIES, CONIGLIARO NO.:  [id number]   MEDICAL RECORD NO.:  [id number]                   PATIENT TYPE:  AMB   LOCATION:  ENDO                                 FACILITY:  [facility]   PHYSICIAN:  Georgiana Spinner, M.D.                 DATE OF BIRTH:  07-29-1929   DATE OF PROCEDURE:  01/13/2002  DATE OF DISCHARGE:                                 OPERATIVE REPORT   PROCEDURE:  Upper endoscopy.   INDICATIONS:  Hemoccult positivity.   ANESTHESIA:  Demerol 100 mg, Versed 10 mg.   DESCRIPTION OF PROCEDURE:  With the patient mildly sedated in the left  lateral decubitus position, the Olympus videoscopic endoscope was inserted  in the mouth, passed under direct vision through the esophagus, which  appeared normal, into the stomach.  Fundus, body, and antrum showed multiple  areas of patchy, splotchy erythema, which was photographed and subsequently  biopsied.  The duodenal bulb and second portion of the duodenum appeared  normal.  From this point the endoscope was slowly withdrawn, taking  circumferential views of the entire duodenal mucosa until the endoscope had  been pulled back into the stomach, placed in retroflexion to view the  stomach from below.  The endoscope was then straightened and withdrawn,  taking circumferential views of the remaining gastric and esophageal mucosa.  The patient's vital signs and pulse oximetry remained stable.  The patient  tolerated the procedure well without apparent complications.   FINDINGS:  Splotchy, diffuse, patchy erythema of stomach.   PLAN:  Await biopsy report.  The patient will call me for results and follow  up with me as an outpatient.  Proceed to colonoscopy as planned.                                                Georgiana Spinner, M.D.    GMO/MEDQ  D:  01/13/2002  T:  01/14/2002  Job:  [id number]

## 2010-09-20 NOTE — Consult Note (Signed)
NAMEJARAMIAH, Manuel King NO.:  [id number]   MEDICAL RECORD NO.:  [id number]          PATIENT TYPE:  EMS   LOCATION:  ED                           FACILITY:  [facility]   PHYSICIAN:  Lindaann Slough, M.D.  DATE OF BIRTH:  Jun 17, 1929   DATE OF CONSULTATION:  09/09/2005  DATE OF DISCHARGE:  09/09/2005                                   CONSULTATION   Mr. Jewel had seeds implantation on Sep 08, 2005.  He was instructed  how to  remove the Foley catheter; however, he pulled the Foley with the balloon  inflated, and he came to the emergency room with urethral bleeding.  The  nurses passed the Foley catheter; however, the catheter was inflated in the  urethra.  I removed the Foley catheter and attempted to pass a #16 Foley in  the bladder, but I was unable to get it into the bladder.  I then passed a  #16 coude catheter in the bladder without difficulty and obtained clear  urine.  The catheter was left to straight drainage.  The catheter will be  left in place for a few days and then removed in the office.      Lindaann Slough, M.D.  Electronically Signed     MN/MEDQ  D:  09/09/2005  T:  09/10/2005  Job:  [id number]

## 2010-09-20 NOTE — Op Note (Signed)
[place]. [facility]  Patient:    Manuel King, Manuel King                       MRN: [id number] Proc. Date: 09/27/99 Adm. Date:  [id number] Disc. Date: [id number] Attending:  Danella Penton                           Operative Report  PREOPERATIVE DIAGNOSIS:  Bilateral carpal tunnel syndrome, left worse than the right.  POSTOPERATIVE DIAGNOSIS:  Bilateral carpal tunnel syndrome, left worse than the right.  PROCEDURE:  Decompression of the left median nerve.  ANESTHESIA:  Intravenous sedation plus local.  SURGEON:  Tanya Nones. Jeral Fruit, M.D.  CLINICAL HISTORY:  The patient is a gentleman who underwent anterior cervical diskectomy because of cervical myelopathy.  Now, he is complaining of numbness in both hands.  Nerve conduction velocity showed bilateral carpal tunnel syndrome.  The nerve conduction velocity showed that the right was worse than the left one, but nevertheless the patient failed, and the left one clinically was worse.  Because of that, we proceeded to decompress the left median nerve and later on, will do a right one.  The patient new about the risks such as no improvement whatsoever, weakness, infection, hematoma, and need for further surgery.  DESCRIPTION OF PROCEDURE:  The patient was taken to the operating room and the left arm and hand was prepped with Betadine.  Infiltration along the base of the thumb with 1% Xylocaine, 10 cc was used.  Then, intravenous sedation was given.  Having done this, drapes were applied to the hand.  An incision along the base of the thumb through the skin, volar ligament was done.  We found a thick carpal ligament.  An incision was made along the ulnar aspect of the median nerve.  Decompression proximal and distal was achieved.  Having done this, the area was irrigated.  Hemostasis was done with bipolar. Before we closed, the patient was able to move the fingers including the thumb.  Then, the wound was  closed using nylon.  A bulky dressing was applied to the hand.  From then on, the patient will go to the recovery room and then I will follow him in my office in two days. DD:  09/27/99 TD:  10/01/99 Job: [id number]

## 2010-09-20 NOTE — Op Note (Signed)
[place]. [facility]  Patient:    Manuel King, Manuel King                      MRN: [id number] Proc. Date: 08/12/00 Attending:  Lowell Bouton, M.D.                           Operative Report  PREOPERATIVE DIAGNOSIS:   Right carpal tunnel syndrome.  POSTOPERATIVE DIAGNOSIS:   Right carpal tunnel syndrome.  OPERATION PERFORMED:  Decompression median nerve, right carpal tunnel.  SURGEON:  Lowell Bouton, M.D.  ANESTHESIA:  0.5% Marcaine local with sedation.  OPERATIVE FINDINGS:  The patient had an intact motor branch and no masses in the carpal canal.  DESCRIPTION OF PROCEDURE:  Under 0.5% Marcaine local anesthesia with a tourniquet on the right arm, the right hand was prepped and draped in the usual fashion and after exsanguinating the limb, the tourniquet was inflated to 250 mmHg.  A 3 cm longitudinal incision was made in the palm just ulnar to the thenar crease.  Sharp dissection was carried through the subcutaneous tissues and bleeding points were coagulated.  Blunt dissection was carried down through the superficial palmar fascia distal to the transverse carpal ligament.  A hemostat was placed in the carpal canal up against the hook of the hamate.  The transverse carpal ligament was then divided on the ulnar border of the median nerve with a knife and the proximal end of the ligament was divided with the scissors after dissecting the nerve away from the undersurface of the ligament.  The carpal canal was then palpated and was found to be adequately decompressed.  The nerve was examined and motor branch identified.  The wound was irrigated with saline and the skin was closed with 4-0 nylon sutures.  Sterile dressings were applied followed by a volar wrist splint.  The patient tolerated the procedure well and went to the recovery room awake and stable in good condition. DD:  08/12/00 TD:  08/12/00 Job: [id number]

## 2010-09-20 NOTE — Discharge Summary (Signed)
[place]. [facility]  Patient:    Manuel King                        MRN: [id number] Adm. Date:  [id number] Disc. Date: [id number] Attending:  Evern Core Dictator:   Bynum Bellows Idacavage, P.A.C. CC:         Laurier Nancy, M.D.             Kerry Kass, M.D. [facility]             Tanya Nones. Jeral Fruit, M.D.                           Discharge Summary  DIAGNOSES: 1. Status post anterior cervical diskectomy. 2. History of back surgery in the past. 3. Status post right brain cerebrovascular accident. 4. Hypertension.  HISTORY OF PRESENT ILLNESS AND HOSPITAL COURSE:  The patient is a 77 year old male who was admitted with C3-C4 herniated disk with myelopathy.  He underwent anterior cervical 3-4 diskectomy and fusion by Dr. Jeral Fruit on May 29, 1999.  It was felt that he would require a rehabilitation stay prior to returning home. Therefore, he was admitted to [facility] on May 31, 1999, where e has had the opportunity for greater than three hours per day of physical and occupational therapy.  He has been placed on Elavil and Neurontin for neuropathic pain in his bilateral upper extremities.  He also had problems with high blood pressure, for which he was placed on Cardizem.  Initially his blood pressures were running systolically in the 180s and diastolically in the 90s.  On discharge today, his blood pressure is 150/90.  It is felt that he is a suitable candidate for discharge to home at this time with close follow-up by his primary care physician to monitor his blood pressure.  His current level of activity is:  He is independent in his bed mobility and is supervisory level to minimal assist with  transfers.  He is able to ambulate 60 feet with minimal assistance and a rolling walker.  MEDICATIONS: 1. Cardizem CD 180 mg daily. 2. One enteric-coated aspirin daily. 3. Elavil 25 mg at bedtime. 4.  Neurontin 300 mg t.i.d. 5. Vasotec 10 mg daily. 6. Multivitamin with iron daily. 7. Vicodin 1 every four hours as needed.  FOLLOW-UP:  Primary care physician needs to follow up with blood pressure issues, as well as whether the patient needs to resume his Plavix, for which he is on for stroke prophylaxis.  Currently, he is on aspirin alone for stroke prophylaxis. He will be requested to follow up with Dr. Jeral Fruit in two weeks, with his primary care physician in two to four weeks.  CONDITION ON DISCHARGE:  Stable. DD:  06/11/99 TD:  06/11/99 Job: [id number]

## 2010-11-03 ENCOUNTER — Other Ambulatory Visit: Payer: Self-pay | Admitting: Internal Medicine

## 2010-12-02 ENCOUNTER — Other Ambulatory Visit: Payer: Self-pay | Admitting: Internal Medicine

## 2011-01-03 ENCOUNTER — Other Ambulatory Visit: Payer: Self-pay | Admitting: Internal Medicine

## 2011-01-03 ENCOUNTER — Ambulatory Visit
Admission: RE | Admit: 2011-01-03 | Discharge: 2011-01-03 | Disposition: A | Discharge status: Home / Self Care | Payer: Medicare Other | Source: Ambulatory Visit | Attending: Internal Medicine | Admitting: Internal Medicine

## 2011-01-03 DIAGNOSIS — R410 Disorientation, unspecified: Secondary | ICD-10-CM

## 2011-01-03 DIAGNOSIS — R51 Headache: Secondary | ICD-10-CM

## 2011-01-03 DIAGNOSIS — F0390 Unspecified dementia without behavioral disturbance: Secondary | ICD-10-CM

## 2011-05-10 ENCOUNTER — Inpatient Hospital Stay (HOSPITAL_COMMUNITY)
Admission: EM | Admit: 2011-05-10 | Discharge: 2011-05-13 | DRG: 155 | Disposition: A | Discharge status: Home / Self Care | Payer: Medicare Other | Attending: Internal Medicine | Admitting: Internal Medicine

## 2011-05-10 ENCOUNTER — Encounter: Payer: Self-pay | Admitting: Emergency Medicine

## 2011-05-10 ENCOUNTER — Emergency Department (HOSPITAL_COMMUNITY): Payer: Medicare Other

## 2011-05-10 DIAGNOSIS — K1121 Acute sialoadenitis: Secondary | ICD-10-CM | POA: Diagnosis present

## 2011-05-10 DIAGNOSIS — Z7982 Long term (current) use of aspirin: Secondary | ICD-10-CM

## 2011-05-10 DIAGNOSIS — I498 Other specified cardiac arrhythmias: Secondary | ICD-10-CM | POA: Diagnosis present

## 2011-05-10 DIAGNOSIS — L0390 Cellulitis, unspecified: Secondary | ICD-10-CM

## 2011-05-10 DIAGNOSIS — Z79899 Other long term (current) drug therapy: Secondary | ICD-10-CM

## 2011-05-10 DIAGNOSIS — F0150 Vascular dementia without behavioral disturbance: Secondary | ICD-10-CM | POA: Diagnosis present

## 2011-05-10 DIAGNOSIS — E871 Hypo-osmolality and hyponatremia: Secondary | ICD-10-CM | POA: Diagnosis present

## 2011-05-10 DIAGNOSIS — K1120 Sialoadenitis, unspecified: Secondary | ICD-10-CM

## 2011-05-10 DIAGNOSIS — I672 Cerebral atherosclerosis: Secondary | ICD-10-CM | POA: Diagnosis present

## 2011-05-10 DIAGNOSIS — I10 Essential (primary) hypertension: Secondary | ICD-10-CM | POA: Diagnosis present

## 2011-05-10 DIAGNOSIS — Z8673 Personal history of transient ischemic attack (TIA), and cerebral infarction without residual deficits: Secondary | ICD-10-CM

## 2011-05-10 DIAGNOSIS — E785 Hyperlipidemia, unspecified: Secondary | ICD-10-CM | POA: Diagnosis present

## 2011-05-10 HISTORY — DX: Essential (primary) hypertension: I10

## 2011-05-10 HISTORY — DX: Malignant neoplasm of prostate: C61

## 2011-05-10 HISTORY — DX: Cerebral infarction, unspecified: I63.9

## 2011-05-10 HISTORY — DX: Unspecified dementia, unspecified severity, without behavioral disturbance, psychotic disturbance, mood disturbance, and anxiety: F03.90

## 2011-05-10 LAB — POCT I-STAT, CHEM 8
Calcium, Ion: 1.1 mmol/L — ABNORMAL LOW (ref 1.12–1.32)
Chloride: 105 mEq/L (ref 96–112)
Glucose, Bld: 109 mg/dL — ABNORMAL HIGH (ref 70–99)
HCT: 40 % (ref 39.0–52.0)
Hemoglobin: 13.6 g/dL (ref 13.0–17.0)
TCO2: 24 mmol/L (ref 0–100)

## 2011-05-10 LAB — DIFFERENTIAL
Basophils Absolute: 0 10*3/uL (ref 0.0–0.1)
Basophils Relative: 0 % (ref 0–1)
Eosinophils Absolute: 0.2 10*3/uL (ref 0.0–0.7)
Eosinophils Relative: 3 % (ref 0–5)
Monocytes Absolute: 0.6 10*3/uL (ref 0.1–1.0)
Monocytes Relative: 8 % (ref 3–12)
Neutro Abs: 5.7 10*3/uL (ref 1.7–7.7)

## 2011-05-10 LAB — CBC
HCT: 36.1 % — ABNORMAL LOW (ref 39.0–52.0)
MCH: 26.1 pg (ref 26.0–34.0)
MCHC: 33.2 g/dL (ref 30.0–36.0)
RDW: 17.7 % — ABNORMAL HIGH (ref 11.5–15.5)

## 2011-05-10 MED ORDER — SODIUM CHLORIDE 0.9 % IJ SOLN: 3.0000 mL | Freq: Two times a day (BID) | INTRAMUSCULAR | Status: DC

## 2011-05-10 MED ORDER — SODIUM CHLORIDE 0.9 % IV BOLUS (SEPSIS)
1000.0000 mL | Freq: Once | INTRAVENOUS | Status: AC
  Administered 2011-05-10: 1000 mL via INTRAVENOUS

## 2011-05-10 MED ORDER — MORPHINE SULFATE 2 MG/ML IJ SOLN
2.0000 mg | INTRAMUSCULAR | Status: DC | PRN
  Administered 2011-05-10: 2 mg via INTRAVENOUS
  Filled 2011-05-10: qty 1

## 2011-05-10 MED ORDER — HYDROCODONE-ACETAMINOPHEN 5-325 MG PO TABS
1.0000 | ORAL_TABLET | ORAL | Status: DC | PRN
  Administered 2011-05-10 – 2011-05-11 (×2): 1 via ORAL
  Administered 2011-05-12: 2 via ORAL
  Filled 2011-05-10: qty 2
  Filled 2011-05-10 (×2): qty 1

## 2011-05-10 MED ORDER — GABAPENTIN 800 MG PO TABS
800.0000 mg | ORAL_TABLET | Freq: Three times a day (TID) | ORAL | Status: DC
Start: 2011-05-10 — End: 2011-05-10
  Administered 2011-05-10: 800 mg via ORAL
  Filled 2011-05-10 (×2): qty 1

## 2011-05-10 MED ORDER — HYDROCHLOROTHIAZIDE 25 MG PO TABS
12.5000 mg | ORAL_TABLET | Freq: Every day | ORAL | Status: DC
  Administered 2011-05-10 – 2011-05-11 (×2): 12.5 mg via ORAL
  Filled 2011-05-10 (×2): qty 0.5

## 2011-05-10 MED ORDER — IOHEXOL 300 MG/ML  SOLN
75.0000 mL | Freq: Once | INTRAMUSCULAR | Status: AC | PRN
  Administered 2011-05-10: 75 mL via INTRAVENOUS

## 2011-05-10 MED ORDER — DONEPEZIL HCL 5 MG PO TABS
5.0000 mg | ORAL_TABLET | Freq: Every day | ORAL | Status: DC
  Administered 2011-05-10: 5 mg via ORAL
  Filled 2011-05-10 (×2): qty 1

## 2011-05-10 MED ORDER — ENOXAPARIN SODIUM 40 MG/0.4ML ~~LOC~~ SOLN
40.0000 mg | SUBCUTANEOUS | Status: DC
  Administered 2011-05-10 – 2011-05-12 (×3): 40 mg via SUBCUTANEOUS
  Filled 2011-05-10 (×4): qty 0.4

## 2011-05-10 MED ORDER — AMLODIPINE BESYLATE 5 MG PO TABS
5.0000 mg | ORAL_TABLET | Freq: Every day | ORAL | Status: DC
  Administered 2011-05-10 – 2011-05-13 (×4): 5 mg via ORAL
  Filled 2011-05-10 (×4): qty 1

## 2011-05-10 MED ORDER — ASPIRIN 81 MG PO CHEW
81.0000 mg | CHEWABLE_TABLET | Freq: Every day | ORAL | Status: DC
  Administered 2011-05-10 – 2011-05-13 (×4): 81 mg via ORAL
  Filled 2011-05-10 (×4): qty 1

## 2011-05-10 MED ORDER — FENTANYL CITRATE 0.05 MG/ML IJ SOLN
100.0000 ug | Freq: Once | INTRAMUSCULAR | Status: AC
  Administered 2011-05-10: 100 ug via INTRAVENOUS
  Filled 2011-05-10: qty 2

## 2011-05-10 MED ORDER — GABAPENTIN 400 MG PO CAPS
800.0000 mg | ORAL_CAPSULE | Freq: Three times a day (TID) | ORAL | Status: DC
  Administered 2011-05-10 – 2011-05-13 (×9): 800 mg via ORAL
  Filled 2011-05-10 (×10): qty 2

## 2011-05-10 MED ORDER — SODIUM CHLORIDE 0.9 % IV SOLN
3.0000 g | Freq: Once | INTRAVENOUS | Status: AC
  Administered 2011-05-10: 3 g via INTRAVENOUS
  Filled 2011-05-10: qty 3

## 2011-05-10 MED ORDER — SODIUM CHLORIDE 0.9 % IV SOLN
1500.0000 mg | INTRAVENOUS | Status: DC
  Administered 2011-05-11 – 2011-05-13 (×3): 1500 mg via INTRAVENOUS
  Filled 2011-05-10 (×3): qty 1500

## 2011-05-10 MED ORDER — VANCOMYCIN HCL IN DEXTROSE 1-5 GM/200ML-% IV SOLN
1000.0000 mg | Freq: Once | INTRAVENOUS | Status: AC
  Administered 2011-05-10: 1000 mg via INTRAVENOUS
  Filled 2011-05-10: qty 200

## 2011-05-10 MED ORDER — LISINOPRIL 10 MG PO TABS
10.0000 mg | ORAL_TABLET | Freq: Every day | ORAL | Status: DC
  Administered 2011-05-11 – 2011-05-13 (×3): 10 mg via ORAL
  Filled 2011-05-10 (×3): qty 1

## 2011-05-10 MED ORDER — SODIUM CHLORIDE 0.9 % IJ SOLN: 3.0000 mL | INTRAMUSCULAR | Status: DC | PRN

## 2011-05-10 MED ORDER — DEXAMETHASONE SODIUM PHOSPHATE 10 MG/ML IJ SOLN
10.0000 mg | Freq: Three times a day (TID) | INTRAMUSCULAR | Status: AC
  Administered 2011-05-10 – 2011-05-11 (×3): 10 mg via INTRAVENOUS
  Filled 2011-05-10 (×3): qty 1

## 2011-05-10 MED ORDER — SIMVASTATIN 20 MG PO TABS
20.0000 mg | ORAL_TABLET | Freq: Every day | ORAL | Status: DC
  Administered 2011-05-10 – 2011-05-12 (×3): 20 mg via ORAL
  Filled 2011-05-10 (×4): qty 1

## 2011-05-10 MED ORDER — DOXYCYCLINE HYCLATE 100 MG IV SOLR: 100.0000 mg | Freq: Two times a day (BID) | INTRAVENOUS | Status: DC

## 2011-05-10 MED ORDER — OXYBUTYNIN CHLORIDE 5 MG PO TABS
5.0000 mg | ORAL_TABLET | Freq: Three times a day (TID) | ORAL | Status: DC
  Administered 2011-05-10 – 2011-05-13 (×10): 5 mg via ORAL
  Filled 2011-05-10 (×11): qty 1

## 2011-05-10 MED ORDER — SENNA 8.6 MG PO TABS
1.0000 | ORAL_TABLET | Freq: Every day | ORAL | Status: DC | PRN
  Administered 2011-05-11: 8.6 mg via ORAL
  Filled 2011-05-10: qty 1

## 2011-05-10 MED ORDER — DEXAMETHASONE SODIUM PHOSPHATE 4 MG/ML IJ SOLN
8.0000 mg | Freq: Once | INTRAMUSCULAR | Status: AC
  Administered 2011-05-10: 8 mg via INTRAVENOUS
  Filled 2011-05-10: qty 2

## 2011-05-10 MED ORDER — DOXYCYCLINE HYCLATE 100 MG IV SOLR
100.0000 mg | Freq: Two times a day (BID) | INTRAVENOUS | Status: DC
  Administered 2011-05-10: 100 mg via INTRAVENOUS
  Filled 2011-05-10 (×2): qty 100

## 2011-05-10 MED ORDER — DEXAMETHASONE SODIUM PHOSPHATE 10 MG/ML IJ SOLN: 10.0000 mg | Freq: Three times a day (TID) | INTRAMUSCULAR | Status: DC

## 2011-05-10 MED ORDER — SODIUM CHLORIDE 0.9 % IV SOLN
3.0000 g | Freq: Four times a day (QID) | INTRAVENOUS | Status: DC
  Administered 2011-05-10 – 2011-05-13 (×10): 3 g via INTRAVENOUS
  Filled 2011-05-10 (×13): qty 3

## 2011-05-10 MED ORDER — DIVALPROEX SODIUM 250 MG PO DR TAB
250.0000 mg | DELAYED_RELEASE_TABLET | Freq: Two times a day (BID) | ORAL | Status: DC
  Administered 2011-05-10 (×2): 250 mg via ORAL
  Administered 2011-05-11: 500 mg via ORAL
  Administered 2011-05-11 – 2011-05-12 (×2): 250 mg via ORAL
  Filled 2011-05-10 (×7): qty 2

## 2011-05-10 MED ORDER — ALLOPURINOL 100 MG PO TABS
100.0000 mg | ORAL_TABLET | Freq: Every day | ORAL | Status: DC
  Administered 2011-05-10 – 2011-05-13 (×4): 100 mg via ORAL
  Filled 2011-05-10 (×4): qty 1

## 2011-05-10 MED ORDER — SODIUM CHLORIDE 0.9 % IV SOLN: 250.0000 mL | INTRAVENOUS | Status: DC | PRN

## 2011-05-10 MED ORDER — SODIUM CHLORIDE 0.9 % IV SOLN
INTRAVENOUS | Status: DC
  Administered 2011-05-10 – 2011-05-11 (×2): via INTRAVENOUS
  Administered 2011-05-12: 75 mL/h via INTRAVENOUS

## 2011-05-10 MED ORDER — PREDNISONE 20 MG PO TABS
40.0000 mg | ORAL_TABLET | Freq: Every day | ORAL | Status: DC
  Administered 2011-05-11 – 2011-05-13 (×3): 40 mg via ORAL
  Filled 2011-05-10 (×4): qty 2

## 2011-05-10 NOTE — Consults (Signed)
Manuel King, Manuel King [id number] June 29, 1929 [name], MD  Reason for Consult: left acute parotitis  HPI: 78 yo male who has had decreased PO intake recently and left parotid swelling. He was treated with Augmentin and the swelling has progressed. CT neck with contrast was obtained that I personally reviewed. This demonstrates a left parotid that is inflamed and enlarged diffusely with two 1-2 cm hypodense area representing either necrotis intraparotid nodes/small abscesses or both. There is some edema of the adjacent masseter muscle and some scattered reactive left cervical lymphadenopathy. 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 ENT is consulted for recommendations for treatment of the acute parotitis.  Allergies: No Known Allergies  ROS: left facial pain and swelling, trismus, otherwise negative x 10 systems except per the HPI  PMH:  Past Medical History  Diagnosis Date  . Hypertension   . CVA (cerebral vascular accident)   . Dementia   . Prostate cancer     FH: No family history on file.  SH:  History   Social History  . Marital Status: Divorced    Spouse Name: N/A    Number of Children: N/A  . Years of Education: N/A   Occupational History  . Not on file.   Social History Main Topics  . Smoking status: Never Smoker   . Smokeless tobacco: Not on file  . Alcohol Use: No  . Drug Use: No  . Sexually Active:    Other Topics Concern  . Not on file   Social History Narrative  . No narrative on file    PSH:  Past Surgical History  Procedure Date  . Neck surgery     Physical  Exam: CN 2-12 grossly intact and symmetric. Facial nerve is House-Brackmann 1/6 bilaterally in all divisions. EAC/TMs normal BL. Oral cavity, lips, gums, ororpharynx normal with no masses or lesions. No purulence is expressed from the left Stenson's duct. He has  moderate trismus to ~ 5 cm. Skin warm and dry. Nasal cavity without polyps or purulence. External nose and ears without masses or lesions. EOMI, PERRLA. The left parotid gland is exquisitely tender with diffuse, firm edema.  A/P: acute left parotitis likely secondary to dehydration. This is quite common in elderly patients with dehydration. This is frequently due to MRSA so antibiotic coverage should cover MRSA. I recommend double coverage antibiotics with IV Vancomycin and IV or PO doxycycline BID along with IV Decadron 10 mg IV Q 8 hours x 3 doses transitioning to a PO 40 mg prednisone taper. He should be hydrated intravenously and have warm compresses/massage to the left parotid as well as sialogogues (lemon wedges/sour candy, etc.) to encourage salivary flow. The diffuse soft tissue enlargement and tenderness will take several days to resolve clinically so I advised the patient and family not to expect immediate clinical resolution. Recommend admission to the medical service and monitoring for clinical improvement.   Manuel King 05/10/2011 10:17 AM

## 2011-05-10 NOTE — H&P (Addendum)
PCP:  No primary provider on file.   DOA:  05/10/2011  5:26 AM  Chief Complaint:  Pain and swelling of left side of the face since 2 weeks  HPI: 78 y/o male with hx of HTN, CVA, vascular  dementia, prostate ca and active smoker who presented with 2 week hx of pain and swelling of left cheek with difficulty chewing and moving his jaw . Patient denies any fever or chills, denies any trauma,, denies any insect bite or recent change in medciations. He denies any difficulty breathing, nausea , vomiting . denies any headache or earache. deneis blurring or vision. A maxillofacial CT done in ED showed Left parotiditis with inflammation extending into the adjacent musculature and submandibular gland with an early developing deep tissue abscess. patient admitted to medical floor for further management.   Allergies: No Known Allergies  Prior to Admission medications   Medication Sig Start Date End Date Taking? Authorizing Provider  allopurinol (ZYLOPRIM) 100 MG tablet Take 100 mg by mouth daily.     Yes Historical Provider, MD  amLODipine (NORVASC) 5 MG tablet Take 5 mg by mouth daily.     Yes Historical Provider, MD  aspirin 81 MG chewable tablet Chew 81 mg by mouth daily.     Yes Historical Provider, MD  divalproex (DEPAKOTE) 250 MG DR tablet Take 250-500 mg by mouth 2 (two) times daily. 1 tablet every morning and 2 tablets every evening    Yes Historical Provider, MD  donepezil (ARICEPT) 5 MG tablet Take 5 mg by mouth at bedtime.     Yes Historical Provider, MD  gabapentin (NEURONTIN) 800 MG tablet Take 800 mg by mouth 3 (three) times daily.     Yes Historical Provider, MD  hydrochlorothiazide (HYDRODIURIL) 25 MG tablet Take 12.5 mg by mouth daily.     Yes Historical Provider, MD  lisinopril (PRINIVIL,ZESTRIL) 10 MG tablet Take 10 mg by mouth daily.     Yes Historical Provider, MD  oxybutynin (DITROPAN) 5 MG tablet Take 5 mg by mouth 3 (three) times daily.     Yes Historical Provider, MD    pravastatin (PRAVACHOL) 40 MG tablet TAKE ONE TABLET BY MOUTH EVERY DAY(PT NEEDS OFFICE VISIT FOR MORE REFILLS) 12/02/10  Yes [name], MD    Past Medical History  Diagnosis Date  . Hypertension   . CVA (cerebral vascular accident)   . Dementia   . Prostate cancer     Past Surgical History  Procedure Date  . Neck surgery   . Back surgery     Social History:  reports that he has been smoking 2 Cigars daily.   He reports that he drinks about .6 ounces of alcohol per week. He reports that he does not use illicit drugs.  Family History  Problem Relation Age of Onset  . Hypotension Mother   . Hypotension Father     Review of Systems:  Constitutional: Denies fever, chills, diaphoresis, appetite change and fatigue.  HEENT: positive for swelling over  left cheek with pain and difficulty opening the mouth and chewing. Denies photophobia, eye pain, redness, hearing loss, ear pain, congestion, sore throat, rhinorrhea, sneezing, mouth sores, neck pain, neck stiffness and tinnitus.   Respiratory: Denies SOB, DOE, cough, chest tightness,  and wheezing.   Cardiovascular: Denies chest pain, palpitations and leg swelling.  Gastrointestinal: Denies nausea, vomiting, abdominal pain, diarrhea, constipation, blood in stool and abdominal distention.  Genitourinary: Denies dysuria, urgency, frequency, hematuria, flank pain and difficulty urinating.  Musculoskeletal: Denies myalgias, back pain, joint swelling, arthralgias and gait problem.  Skin: Denies pallor, rash and wound.  Neurological: Denies dizziness, seizures, syncope, weakness, light-headedness, numbness and headaches.  Hematological: Denies adenopathy. Easy bruising, personal or family bleeding history  Psychiatric/Behavioral: Denies suicidal ideation, mood changes, confusion, nervousness, sleep disturbance and agitation   Physical Exam:  Filed Vitals:   05/10/11 0945 05/10/11 1000 05/10/11 1115 05/10/11 1215  BP: 155/67 154/69  151/71 139/69  Pulse: 74 71 65 72  Temp:    99.8 F (37.7 C)  TempSrc:    Oral  Resp: 16 17 18 20   Height:    6\' 4"  (1.93 m)  Weight:    95.709 kg (211 lb)  SpO2: 99% 98% 98% 97%    Constitutional: Vital signs reviewed.  Patient is a well-developed and well-nourished in no acute distress and cooperative with exam. Alert and oriented x3.  HEENT: swelling over left parotid with some warmth and tenderness. Oral cavity wnl qwith dry mucosa, no pallor, no icterus, no LAD Cardiovascular: RRR, S1 normal, S2 normal, no MRG, pulses symmetric and intact bilaterally Pulmonary/Chest: CTAB, no wheezes, rales, or rhonchi Abdominal: Soft. Non-tender, non-distended, bowel sounds are normal, no masses, organomegaly, or guarding present.  GU: no CVA tenderness Musculoskeletal: No joint deformities, erythema, or stiffness, ROM full and no nontender Ext: no edema and no cyanosis, pulses palpable bilaterally (DP and PT) Hematology: no cervical, inginal, or axillary adenopathy.  Neurological: A&O x2, Strenght is normal and symmetric bilaterally, cranial nerve II-XII are grossly intact, no focal motor deficit, sensory intact to light touch bilaterally.  Skin: Warm, dry and intact. No rash, cyanosis, or clubbing.  Psychiatric: Normal mood and affect. speech and behavior is normal. Judgment and thought content normal. Cognition and memory are normal.   Labs on Admission:  Results for orders placed during the hospital encounter of 05/10/11 (from the past 48 hour(s))  CBC     Status: Abnormal   Collection Time   05/10/11  6:05 AM      Component Value Range Comment   WBC 7.9  4.0 - 10.5 (K/uL)    RBC 4.60  4.22 - 5.81 (MIL/uL)    Hemoglobin 12.0 (*) 13.0 - 17.0 (g/dL)    HCT 78.2 (*) 95.6 - 52.0 (%)    MCV 78.5  78.0 - 100.0 (fL)    MCH 26.1  26.0 - 34.0 (pg)    MCHC 33.2  30.0 - 36.0 (g/dL)    RDW 21.3 (*) 08.6 - 15.5 (%)    Platelets 209  150 - 400 (K/uL)   DIFFERENTIAL     Status: Normal   Collection  Time   05/10/11  6:05 AM      Component Value Range Comment   Neutrophils Relative 73  43 - 77 (%)    Neutro Abs 5.7  1.7 - 7.7 (K/uL)    Lymphocytes Relative 16  12 - 46 (%)    Lymphs Abs 1.3  0.7 - 4.0 (K/uL)    Monocytes Relative 8  3 - 12 (%)    Monocytes Absolute 0.6  0.1 - 1.0 (K/uL)    Eosinophils Relative 3  0 - 5 (%)    Eosinophils Absolute 0.2  0.0 - 0.7 (K/uL)    Basophils Relative 0  0 - 1 (%)    Basophils Absolute 0.0  0.0 - 0.1 (K/uL)   POCT I-STAT, CHEM 8     Status: Abnormal   Collection Time   05/10/11  6:20 AM      Component Value Range Comment   Sodium 137  135 - 145 (mEq/L)    Potassium 4.5  3.5 - 5.1 (mEq/L)    Chloride 105  96 - 112 (mEq/L)    BUN 28 (*) 6 - 23 (mg/dL)    Creatinine, Ser 1.61  0.50 - 1.35 (mg/dL)    Glucose, Bld 096 (*) 70 - 99 (mg/dL)    Calcium, Ion 0.45 (*) 1.12 - 1.32 (mmol/L)    TCO2 24  0 - 100 (mmol/L)    Hemoglobin 13.6  13.0 - 17.0 (g/dL)    HCT 40.9  81.1 - 91.4 (%)     Radiological Exams on Admission: No results found.  Assessment/Plan 78 y/o AA male with hx of HTN, CVA with vascular demntia presnted with 2 wks hx of pain and swelling of left parotidw ith findings of acute parotitidis.  Plan:  Acute left parotitis  patient admitted to medial floor  CT maxillofaical shows left parotitidis with inflammation extending into the adjacent  musculature and submandibular gland. There are two small lesions  within the left parotid gland which represent either developing  abscesses or necrotic lymph nodes. 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   patient given a dose of IV decadron in the ED and a dose of IV vanco and unasyn  seen by ENT consult in ED  and recommend continuigng IV abx ( continued on IV vanco and unasyn) , 3 doses of IV decadron follwoed by prednisone po and taper, warm compress and sialogogues to improve salivery flow. Monitor improvement in symptoms Clear liquid diet with straw until  able to chew food properly Will order ensure supplements Cont pain control  HTN cont home meds  Hx of CVA  cont ASA and statins cotn depakote   vascular dementia Cont aricept  DVT Prophylaxis  Full code  Time Spent on Admission: 45 minutes  Asaf Elmquist 05/10/2011, 4:24 PM

## 2011-05-10 NOTE — ED Notes (Signed)
Pt was put on Amoxicillin and Lemon drops by PCP.

## 2011-05-10 NOTE — ED Notes (Signed)
Patient returned from CT

## 2011-05-10 NOTE — ED Notes (Signed)
PT. REPORTS LEFT JAW PAIN / SWELLING X 1 WEEK - SEEN BY PCP 2 DAYS AGO PRESCRIBED WITH ORAL ANTIBIOTIC " INFECTED GLAND OR MUMPS", DENIES FEVER OR CHILLS.

## 2011-05-10 NOTE — ED Provider Notes (Signed)
History     CSN: [id number]  Arrival date & time 05/10/11  0123   First MD Initiated Contact with Patient 05/10/11 0543      Chief Complaint  Patient presents with  . Jaw Pain    (Consider location/radiation/quality/duration/timing/severity/associated sxs/prior treatment) Patient is a 78 y.o. male presenting with general illness. The history is provided by the patient and the spouse. No language interpreter was used.  Illness  The current episode started more than 1 week ago. The onset was gradual. The problem occurs continuously. The problem has been gradually worsening. The problem is severe. The symptoms are relieved by nothing. The symptoms are aggravated by nothing. Associated symptoms include swollen glands and neck pain. Pertinent negatives include no fever, no decreased vision, no double vision, no eye itching, no photophobia, no abdominal pain, no constipation, no nausea, no vomiting, no congestion, no ear discharge, no ear pain, no headaches, no hearing loss, no mouth sores, no rhinorrhea, no sore throat, no stridor, no neck stiffness, no cough, no URI, no rash, no eye discharge, no eye pain and no eye redness. Associated symptoms comments: Facial and neck lesion with swelling and trismus. He has been drinking less than usual and eating less than usual. There were no sick contacts. Recently, medical care has been given by the PCP. Services received include medications given.    Past Medical History  Diagnosis Date  . Hypertension   . CVA (cerebral vascular accident)   . Dementia   . Prostate cancer     Past Surgical History  Procedure Date  . Neck surgery     No family history on file.  History  Substance Use Topics  . Smoking status: Never Smoker   . Smokeless tobacco: Not on file  . Alcohol Use: No      Review of Systems  Constitutional: Negative for fever.  HENT: Positive for facial swelling and neck pain. Negative for hearing loss, ear pain, congestion, sore  throat, rhinorrhea, mouth sores and ear discharge.   Eyes: Negative for double vision, photophobia, pain, discharge, redness and itching.  Respiratory: Negative for cough and stridor.   Cardiovascular: Negative for chest pain.  Gastrointestinal: Negative for nausea, vomiting, abdominal pain, constipation and abdominal distention.  Genitourinary: Negative.   Skin: Negative for rash.  Neurological: Negative for headaches.  Hematological: Positive for adenopathy. Does not bruise/bleed easily.  Psychiatric/Behavioral: Negative.     Allergies  Review of patient's allergies indicates no known allergies.  Home Medications   Current Outpatient Rx  Name Route Sig Dispense Refill  . ALLOPURINOL 100 MG PO TABS Oral Take 100 mg by mouth daily.      Marland Kitchen AMLODIPINE BESYLATE 5 MG PO TABS Oral Take 5 mg by mouth daily.      . ASPIRIN 81 MG PO CHEW Oral Chew 81 mg by mouth daily.      Marland Kitchen DIVALPROEX SODIUM 250 MG PO TBEC Oral Take 250-500 mg by mouth 2 (two) times daily. 1 tablet every morning and 2 tablets every evening     . DONEPEZIL HCL 5 MG PO TABS Oral Take 5 mg by mouth at bedtime.      Marland Kitchen GABAPENTIN 800 MG PO TABS Oral Take 800 mg by mouth 3 (three) times daily.      Marland Kitchen HYDROCHLOROTHIAZIDE 25 MG PO TABS Oral Take 12.5 mg by mouth daily.      Marland Kitchen LISINOPRIL 10 MG PO TABS Oral Take 10 mg by mouth daily.      Marland Kitchen  OXYBUTYNIN CHLORIDE 5 MG PO TABS Oral Take 5 mg by mouth 3 (three) times daily.      Marland Kitchen PRAVASTATIN SODIUM 40 MG PO TABS  TAKE ONE TABLET BY MOUTH EVERY DAY(PT NEEDS OFFICE VISIT FOR MORE REFILLS) 30 tablet 6    BP 145/84  Pulse 70  Temp(Src) 99.8 F (37.7 C) (Oral)  Resp 18  SpO2 97%  Physical Exam  Constitutional: He is oriented to person, place, and time. He appears well-developed and well-nourished.  HENT:  Head:    Mouth/Throat: Oropharynx is clear and moist. No oropharyngeal exudate.  Eyes: EOM are normal. Pupils are equal, round, and reactive to light.  Neck: Neck supple.    Cardiovascular: Normal rate and regular rhythm.   Pulmonary/Chest: Effort normal and breath sounds normal. No stridor. No respiratory distress.  Abdominal: Soft. Bowel sounds are normal. There is no tenderness.  Musculoskeletal: Normal range of motion.  Lymphadenopathy:    He has cervical adenopathy.  Neurological: He is alert and oriented to person, place, and time.  Skin: Skin is warm and dry. He is not diaphoretic.  Psychiatric: Thought content normal.    ED Course  Procedures (including critical care time)  Labs Reviewed  CBC - Abnormal; Notable for the following:    Hemoglobin 12.0 (*)    HCT 36.1 (*)    RDW 17.7 (*)    All other components within normal limits  POCT I-STAT, CHEM 8 - Abnormal; Notable for the following:    BUN 28 (*)    Glucose, Bld 109 (*)    Calcium, Ion 1.10 (*)    All other components within normal limits  DIFFERENTIAL  I-STAT, CHEM 8   No results found.   No diagnosis found.    MDM  Case d/w Dr. Emeline Darling who will see patient in ED MDM Reviewed: vitals and nursing note Interpretation: CT scan and labs Consults: admitting MD          Ethanael Veith K Mi Balla-Rasch, MD 05/10/11 [phone]

## 2011-05-10 NOTE — ED Notes (Signed)
2 attempts to start IV unsuccessful--IV team paged.

## 2011-05-10 NOTE — ED Notes (Signed)
Patient transported to CT 

## 2011-05-10 NOTE — ED Notes (Signed)
Pt back from out of the department; placed back on monitor, continuous pulse oximetry and blood pressure cuff; family at bedside

## 2011-05-10 NOTE — Progress Notes (Signed)
ANTIBIOTIC CONSULT NOTE - INITIAL  Pharmacy Consult for Vancomycin/Unasyn Indication: acute left parotitis  No Known Allergies  Patient Measurements: Height: 6\' 4"  (193 cm) Weight: 211 lb (95.709 kg) IBW/kg (Calculated) : 86.8   Vital Signs: Temp: 99.8 F (37.7 C) (01/05 1215) Temp src: Oral (01/05 1215) BP: 139/69 mmHg (01/05 1215) Pulse Rate: 72  (01/05 1215) Intake/Output from previous day:   Intake/Output from this shift:    Labs:  Basename 05/10/11 0620 05/10/11 0605  WBC -- 7.9  HGB 13.6 12.0*  PLT -- 209  LABCREA -- --  CREATININE 1.30 --   Estimated Creatinine Clearance: 56.6 ml/min (by C-G formula based on Cr of 1.3). No results found for this basename: VANCOTROUGH:2,VANCOPEAK:2,VANCORANDOM:2,GENTTROUGH:2,GENTPEAK:2,GENTRANDOM:2,TOBRATROUGH:2,TOBRAPEAK:2,TOBRARND:2,AMIKACINPEAK:2,AMIKACINTROU:2,AMIKACIN:2, in the last 72 hours   Microbiology: No results found for this or any previous visit (from the past 720 hour(s)).  Medical History: Past Medical History  Diagnosis Date  . Hypertension   . CVA (cerebral vascular accident)   . Dementia   . Prostate cancer     Medications:  Prescriptions prior to admission  Medication Sig Dispense Refill  . allopurinol (ZYLOPRIM) 100 MG tablet Take 100 mg by mouth daily.        Marland Kitchen amLODipine (NORVASC) 5 MG tablet Take 5 mg by mouth daily.        Marland Kitchen aspirin 81 MG chewable tablet Chew 81 mg by mouth daily.        . divalproex (DEPAKOTE) 250 MG DR tablet Take 250-500 mg by mouth 2 (two) times daily. 1 tablet every morning and 2 tablets every evening       . donepezil (ARICEPT) 5 MG tablet Take 5 mg by mouth at bedtime.        . gabapentin (NEURONTIN) 800 MG tablet Take 800 mg by mouth 3 (three) times daily.        . hydrochlorothiazide (HYDRODIURIL) 25 MG tablet Take 12.5 mg by mouth daily.        Marland Kitchen lisinopril (PRINIVIL,ZESTRIL) 10 MG tablet Take 10 mg by mouth daily.        Marland Kitchen oxybutynin (DITROPAN) 5 MG tablet Take 5 mg  by mouth 3 (three) times daily.        . pravastatin (PRAVACHOL) 40 MG tablet TAKE ONE TABLET BY MOUTH EVERY DAY(PT NEEDS OFFICE VISIT FOR MORE REFILLS)  30 tablet  6   Assessment: 78 y/o male patient admitted with acute left parotitis requiring broad spectrum antibiotics. Received vanc 1g and unasyn 3g in ED. To continue with the addition of doxy.  Goal of Therapy:  Vancomycin trough level 10-15 mcg/ml  Plan:  Vancomycin 1.5g IV q24h , unasyn 3g iv q6h. Will monitor renal fxn.  Measure antibiotic drug levels at steady state  [place], [place] M 05/10/2011,2:59 PM

## 2011-05-10 NOTE — ED Notes (Signed)
Admitting MD at bedside.

## 2011-05-11 LAB — BASIC METABOLIC PANEL
BUN: 23 mg/dL (ref 6–23)
CO2: 19 mEq/L (ref 19–32)
Chloride: 96 mEq/L (ref 96–112)
Creatinine, Ser: 1.05 mg/dL (ref 0.50–1.35)
Glucose, Bld: 144 mg/dL — ABNORMAL HIGH (ref 70–99)
Potassium: 4.4 mEq/L (ref 3.5–5.1)

## 2011-05-11 MED ORDER — MEMANTINE HCL 5 MG PO TABS
5.0000 mg | ORAL_TABLET | Freq: Every day | ORAL | Status: DC
  Administered 2011-05-11 – 2011-05-13 (×3): 5 mg via ORAL
  Filled 2011-05-11 (×3): qty 1

## 2011-05-11 MED ORDER — ENSURE PUDDING PO PUDG
1.0000 | Freq: Two times a day (BID) | ORAL | Status: DC
  Administered 2011-05-11: 1 via ORAL

## 2011-05-11 NOTE — Progress Notes (Signed)
Subjective: Patient seen and examined this am. informs his left parotid swelling to be improving slowly  Objective:  Vital signs in last 24 hours:  Filed Vitals:   05/10/11 1215 05/10/11 2100 05/11/11 0525 05/11/11 1339  BP: 139/69 155/75 122/53 124/57  Pulse: 72 55 52 45  Temp: 99.8 F (37.7 C) 97.2 F (36.2 C) 98.3 F (36.8 C) 97.6 F (36.4 C)  TempSrc: Oral Oral Oral Oral  Resp: 20 20 29 20   Height: 6\' 4"  (1.93 m)     Weight: 95.709 kg (211 lb)     SpO2: 97% 96% 100% 97%    Intake/Output from previous day:   Intake/Output Summary (Last 24 hours) at 05/11/11 1634 Last data filed at 05/11/11 1340  Gross per 24 hour  Intake   1935 ml  Output      0 ml  Net   1935 ml    Physical Exam:  HEENT: swelling over left parotid which has improved from admission. Oral cavity wnl with dry mucosa, no pallor, no icterus, no LAD  Cardiovascular: RRR, S1 normal, S2 normal, no MRG, Pulmonary/Chest: CTAB, no wheezes, rales, or rhonchi  Abdominal: Soft. Non-tender, non-distended, bowel sounds are normal, no masses, organomegaly, or guarding present.  GU: no CVA tenderness Musculoskeletal: No joint deformities, erythema, or stiffness, ROM full and no nontender Ext: no edema and no cyanosis, pulses palpable bilaterally (DP and PT)  Hematology: no cervical, inginal, or axillary adenopathy.  Neurological: A&O x3, Strenght is normal and symmetric bilaterally, cranial nerve II-XII are grossly intact, no focal motor deficit, sensory intact to light touch bilaterally.    Lab Results:  Basic Metabolic Panel:    Component Value Date/Time   NA 128* 05/11/2011 0620   K 4.4 05/11/2011 0620   CL 96 05/11/2011 0620   CO2 19 05/11/2011 0620   BUN 23 05/11/2011 0620   CREATININE 1.05 05/11/2011 0620   GLUCOSE 144* 05/11/2011 0620   CALCIUM 8.0* 05/11/2011 0620   CBC:    Component Value Date/Time   WBC 7.9 05/10/2011 0605   HGB 13.6 05/10/2011 0620   HCT 40.0 05/10/2011 0620   PLT 209 05/10/2011 0605   MCV  78.5 05/10/2011 0605   NEUTROABS 5.7 05/10/2011 0605   LYMPHSABS 1.3 05/10/2011 0605   MONOABS 0.6 05/10/2011 0605   EOSABS 0.2 05/10/2011 0605   BASOSABS 0.0 05/10/2011 0605    No results found for this or any previous visit (from the past 240 hour(s)).  Studies/Results: Ct Soft Tissue Neck W Contrast  05/10/2011  *RADIOLOGY REPORT*  Clinical Data:  left mandible and neck swelling  CT NECK WITH CONTRAST  Technique:  Multidetector CT imaging of the neck was performed with intravenous contrast.  Contrast: 75mL OMNIPAQUE IOHEXOL 300 MG/ML IV SOLN  Comparison: Head CT dated January 03, 2011  Findings: There is diffuse swelling of the left parotid gland and left submandibular gland, as well as the adjacent masseter muscle and platysma muscle, along with the subcutaneous fat.  There are two small necrotic lesions within the left parotid gland which measure 2.1 cm and 0.9 cm which could represent developing abscesses or necrotic lymph nodes.  Small reactive lymph nodes are present throughout the left neck.  There is a small amount of mass effect on the left in the left parapharyngeal space secondary to the inflammation.  No abnormal soft tissue gas or salivary gland calculi are identified.  Incidental note is made of a 1.9 cm oval soft tissue mass within the  posterior right parotid gland which is most consistent with a benign Warthin's tumor and was present and unchanged compared to the PET scan from June 24, 2005.  Small right basal ganglia lacuna infarct is again noted.  The orbits and calvarium are unremarkable.  There are emphysematous changes at the lung apices.  Cervical spondylosis is present with uncinate process and facet joint hypertrophy.  IMPRESSION: Left parotiditis with inflammation extending into the adjacent musculature and submandibular gland.  There are two small lesions within the left parotid gland which represent either developing abscesses or necrotic lymph nodes.  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  Original Report Authenticated By: Brandon Melnick, M.D.    Medications: Scheduled Meds:   . allopurinol  100 mg Oral Daily  . amLODipine  5 mg Oral Daily  . ampicillin-sulbactam (UNASYN) IV  3 g Intravenous Q6H  . aspirin  81 mg Oral Daily  . dexamethasone  10 mg Intravenous Q8H  . divalproex  250-500 mg Oral BID  . donepezil  5 mg Oral QHS  . enoxaparin  40 mg Subcutaneous Q24H  . feeding supplement  1 Container Oral BID BM  . gabapentin  800 mg Oral TID  . lisinopril  10 mg Oral Daily  . oxybutynin  5 mg Oral TID  . predniSONE  40 mg Oral Q breakfast  . simvastatin  20 mg Oral q1800  . sodium chloride  3 mL Intravenous Q12H  . vancomycin  1,500 mg Intravenous Q24H  . DISCONTD: gabapentin  800 mg Oral TID  . DISCONTD: hydrochlorothiazide  12.5 mg Oral Daily   Continuous Infusions:   . sodium chloride 75 mL/hr at 05/10/11 2000   PRN Meds:.sodium chloride, HYDROcodone-acetaminophen, morphine, senna, sodium chloride  Assessment 78 y/o AA male with hx of HTN, CVA with vascular demntia presnted with 2 wks hx of pain and swelling of left parotidw ith findings of acute parotitidis.   Plan:   Acute left parotitis  patient admitted to medial floor  CT maxillofaical shows left parotitidis with inflammation extending into the adjacent  musculature and submandibular gland. There are two small lesions  within the left parotid gland which represent either developing  abscesses or necrotic lymph nodes. 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  patient given a dose of IV decadron in the ED and a dose of IV vanco and unasyn  seen by ENT consult in ED and recommend continuigng IV abx ( continued on IV vanco and unasyn) , 3 doses of IV decadron follwoed by prednisone po and taper, warm compress and sialogogues to improve salivery flow.  Patient improving today Will advance to soft diet as  tolerated Switched to po prednisone Will order ensure supplements  Cont pain control    Hyponatremia  Na dropped to 128 today Cont  IV hydration  d/c HCTZ Recheck in am  HTN  cont amlodipine Hold HCTZ   Bradycardia  noted for HR in mid 40s to 50s. Not on BB D/c morphine i will switch aricept to namenda as  Bradycardia is one of common side effects of aricept  Hx of CVA  cont ASA and statins  cotn depakote   vascular dementia  Aricept swiched to namenda   DVT Prophylaxis  Full code    LOS: 1 day   Devlyn Retter 05/11/2011, 4:34 PM

## 2011-05-11 NOTE — Progress Notes (Signed)
Subjective: Admitted yesterday for acute left parotitis. Patient reports left facial pain and swelling improved since yesterday and trismus improved as well.  Objective: Vital signs in last 24 hours: Temp:  [97.2 F (36.2 C)-99.8 F (37.7 C)] 98.3 F (36.8 C) (01/06 0525) Pulse Rate:  [52-72] 52  (01/06 0525) Resp:  [18-29] 29  (01/06 0525) BP: (122-155)/(53-75) 122/53 mmHg (01/06 0525) SpO2:  [96 %-100 %] 100 % (01/06 0525) Weight:  [95.709 kg (211 lb)] 211 lb (95.709 kg) (01/05 1215)  Left parotid gland with continued tenderness and edema/erythema, but improved since yesterday. Trismus is decreased with mouth opening to ~ 5-6 cm. Facial nerve is House Brackmann 1/6 bilaterally in all divisions.   Basename 05/11/11 0620 05/10/11 0620  NA 128* 137  K 4.4 4.5  CL 96 105  CO2 19 --  GLUCOSE 144* 109*  BUN 23 28*  CREATININE 1.05 1.30  CALCIUM 8.0* --    Medications: IV unasyn, prednisone, IV Vancomycin  Assessment/Plan: Clinically improved left acute parotitis. Recommend IV/PO hydration, continuing IV antibiotics, warm compresses/massage to the left parotid gland. Would recommend PO doxycycline and prednisone taper when discharged.   LOS: 1 day   [name] 05/11/2011, 10:55 AM

## 2011-05-12 ENCOUNTER — Other Ambulatory Visit: Payer: Self-pay

## 2011-05-12 LAB — BASIC METABOLIC PANEL
BUN: 21 mg/dL (ref 6–23)
CO2: 22 mEq/L (ref 19–32)
Chloride: 97 mEq/L (ref 96–112)
Creatinine, Ser: 1.03 mg/dL (ref 0.50–1.35)
GFR calc Af Amer: 78 mL/min — ABNORMAL LOW (ref >90)
Glucose, Bld: 138 mg/dL — ABNORMAL HIGH (ref 70–99)

## 2011-05-12 MED ORDER — DIVALPROEX SODIUM 500 MG PO DR TAB
500.0000 mg | DELAYED_RELEASE_TABLET | Freq: Every day | ORAL | Status: DC
  Administered 2011-05-12 (×2): 500 mg via ORAL
  Filled 2011-05-12 (×2): qty 1

## 2011-05-12 MED ORDER — DIVALPROEX SODIUM 250 MG PO DR TAB
250.0000 mg | DELAYED_RELEASE_TABLET | Freq: Every day | ORAL | Status: DC
  Administered 2011-05-13: 250 mg via ORAL
  Filled 2011-05-12: qty 1

## 2011-05-12 NOTE — Progress Notes (Signed)
Utilization review completed.  

## 2011-05-12 NOTE — Progress Notes (Signed)
Subjective: Patient seen and examined  this am. Parotid swelling and pain better  Objective:  Vital signs in last 24 hours:  Filed Vitals:   05/11/11 1339 05/11/11 2225 05/12/11 0548 05/12/11 1514  BP: 124/57 153/81 142/70 141/61  Pulse: 45 62 63 49  Temp: 97.6 F (36.4 C) 97.9 F (36.6 C) 98.4 F (36.9 C) 98.2 F (36.8 C)  TempSrc: Oral Oral Oral   Resp: 20 18 18 20   Height:      Weight:      SpO2: 97% 98% 98% 97%    Intake/Output from previous day:   Intake/Output Summary (Last 24 hours) at 05/12/11 1642 Last data filed at 05/12/11 1500  Gross per 24 hour  Intake   1400 ml  Output      0 ml  Net   1400 ml    Physical Exam:  HEENT: swelling over left parotid which has much improved from admission. Oral cavity wnl with dry mucosa, no pallor, no icterus, no lymphadenopathy  Cardiovascular: RRR, S1 normal, S2 normal, no MRG,  Pulmonary/Chest: CTAB, no wheezes, rales, or rhonchi  Abdominal: Soft. Non-tender, non-distended, bowel sounds are normal, no masses, organomegaly, or guarding present.  GU: no CVA tenderness Musculoskeletal: No joint deformities, erythema, or stiffness, ROM full and no nontender Ext: no edema and no cyanosis, pulses palpable bilaterally (DP and PT)  Hematology: no cervical, inginal, or axillary adenopathy.  Neurological: A&O x3, Strenght is normal and symmetric bilaterally, cranial nerve II-XII are grossly intact, no focal motor deficit, sensory intact to light touch bilaterally.    Lab Results:  Basic Metabolic Panel:    Component Value Date/Time   NA 130* 05/12/2011 0545   K 4.1 05/12/2011 0545   CL 97 05/12/2011 0545   CO2 22 05/12/2011 0545   BUN 21 05/12/2011 0545   CREATININE 1.03 05/12/2011 0545   GLUCOSE 138* 05/12/2011 0545   CALCIUM 7.9* 05/12/2011 0545   CBC:    Component Value Date/Time   WBC 7.9 05/10/2011 0605   HGB 13.6 05/10/2011 0620   HCT 40.0 05/10/2011 0620   PLT 209 05/10/2011 0605   MCV 78.5 05/10/2011 0605   NEUTROABS 5.7 05/10/2011  0605   LYMPHSABS 1.3 05/10/2011 0605   MONOABS 0.6 05/10/2011 0605   EOSABS 0.2 05/10/2011 0605   BASOSABS 0.0 05/10/2011 0605    No results found for this or any previous visit (from the past 240 hour(s)).  Studies/Results: No results found.  Medications: Scheduled Meds:   . allopurinol  100 mg Oral Daily  . amLODipine  5 mg Oral Daily  . ampicillin-sulbactam (UNASYN) IV  3 g Intravenous Q6H  . aspirin  81 mg Oral Daily  . divalproex  250 mg Oral Daily  . divalproex  500 mg Oral QHS  . enoxaparin  40 mg Subcutaneous Q24H  . feeding supplement  1 Container Oral BID BM  . gabapentin  800 mg Oral TID  . lisinopril  10 mg Oral Daily  . memantine  5 mg Oral Daily  . oxybutynin  5 mg Oral TID  . predniSONE  40 mg Oral Q breakfast  . simvastatin  20 mg Oral q1800  . sodium chloride  3 mL Intravenous Q12H  . vancomycin  1,500 mg Intravenous Q24H  . DISCONTD: divalproex  250-500 mg Oral BID  . DISCONTD: donepezil  5 mg Oral QHS   Continuous Infusions:   . sodium chloride 75 mL/hr (05/12/11 1614)   PRN Meds:.sodium chloride, HYDROcodone-acetaminophen, senna,  sodium chloride, DISCONTD: morphine   Assessment  78 y/o AA male with hx of HTN, CVA with vascular demntia presnted with 2 wks hx of pain and swelling of left parotidw ith findings of acute parotitidis.   Plan:   Acute left parotitis  patient admitted to medial floor  CT maxillofaical shows left parotitidis with inflammation extending into the adjacent  musculature and submandibular gland. There are two small lesions  within the left parotid gland which represent either developing  abscesses or necrotic lymph nodes. 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  patient given a dose of IV decadron in the ED and a dose of IV vanco and unasyn  seen by ENT consult in ED and recommend continuigng IV abx ( continued on IV vanco and unasyn) , 3 doses of IV decadron follwoed by prednisone po and taper, warm  compress and sialogogues to improve salivery flow.  Patient improving today  Will advance to soft diet as tolerated  Switched to po prednisone  Advance diet Cont pain control   Hyponatremia  Na dropped to 128 , now improving to 130 Cont IV hydration  d/c HCTZ   HTN  cont amlodipine  Hold HCTZ   Bradycardia  noted for HR in mid 40s to 50s. Not on BB  D/c morphine  Switched  aricept to namenda as Bradycardia is one of common side effects of aricept  Will check EKG  Hx of CVA  cont ASA and statins  cotn depakote   vascular dementia  Aricept swiched to namenda   DVT Prophylaxis   Full code        LOS: 2 days   Madasyn Heath 05/12/2011, 4:42 PM

## 2011-05-13 LAB — BASIC METABOLIC PANEL
BUN: 20 mg/dL (ref 6–23)
CO2: 22 mEq/L (ref 19–32)
Calcium: 7.9 mg/dL — ABNORMAL LOW (ref 8.4–10.5)
Chloride: 102 mEq/L (ref 96–112)
Creatinine, Ser: 1.09 mg/dL (ref 0.50–1.35)
Glucose, Bld: 89 mg/dL (ref 70–99)

## 2011-05-13 MED ORDER — AMLODIPINE BESYLATE 5 MG PO TABS: 10.0000 mg | ORAL_TABLET | Freq: Every day | ORAL | Status: DC

## 2011-05-13 MED ORDER — LISINOPRIL 10 MG PO TABS: 20.0000 mg | ORAL_TABLET | Freq: Every day | ORAL | Status: DC

## 2011-05-13 MED ORDER — MEMANTINE HCL 5 MG PO TABS: 5.0000 mg | ORAL_TABLET | Freq: Every day | ORAL | Status: DC

## 2011-05-13 MED ORDER — DOXYCYCLINE HYCLATE 100 MG PO TABS: 100.0000 mg | ORAL_TABLET | Freq: Two times a day (BID) | ORAL | Status: AC

## 2011-05-13 MED ORDER — PREDNISONE 20 MG PO TABS: 20.0000 mg | ORAL_TABLET | Freq: Every day | ORAL | Status: DC

## 2011-05-13 MED ORDER — HYDROCODONE-ACETAMINOPHEN 5-325 MG PO TABS: 1.0000 | ORAL_TABLET | Freq: Four times a day (QID) | ORAL | Status: AC | PRN

## 2011-05-13 MED ORDER — PREDNISONE 20 MG PO TABS: 20.0000 mg | ORAL_TABLET | Freq: Every day | ORAL | Status: AC

## 2011-05-13 NOTE — Discharge Summary (Signed)
Patient ID: Manuel King MRN: [id number] DOB/AGE: 05-22-1929 78 y.o.  Admit date: 05/10/2011 Discharge date: 05/13/2011  Primary Care Physician:  [name], MD, MD  Discharge Diagnoses:    Present on Admission:   Principal Problem:  *Acute parotitis ( left)  Active Problems:  HYPERLIPIDEMIA-MIXED  HYPERTENSION  History of CVA (cerebrovascular accident)  Vascular dementia Hyponatremia  sinus bradycardia  Current Discharge Medication List    START taking these medications   Details  HYDROcodone-acetaminophen (NORCO) 5-325 MG per tablet Take 1 tablet by mouth every 6 (six) hours as needed. Qty: 20 tablet, Refills: 0    memantine (NAMENDA) 5 MG tablet Take 1 tablet (5 mg total) by mouth daily. Qty: 30 tablet, Refills: 0    predniSONE (DELTASONE) 20 MG tablet Take 1 tablet (20 mg total) by mouth daily with breakfast. Qty: 14 tablet, Refills: 0  Take 40 mg daily for 3 days, then 30 mg daily for next 3 days, then 20 mg daily for next 3 days, then 10 mg daily for next 3 days and then stop  Tab doxycycline 100 mg  Tablet               Take 1 tablet po 2 times a day for 10 days      CONTINUE these medications which have CHANGED   Details  amLODipine (NORVASC) 5 MG tablet Take 2 tablets (10 mg total) by mouth daily. Qty: 30 tablet, Refills: 0    lisinopril (PRINIVIL,ZESTRIL) 10 MG tablet Take 2 tablets (20 mg total) by mouth daily. Qty: 30 tablet, Refills: 0      CONTINUE these medications which have NOT CHANGED   Details  allopurinol (ZYLOPRIM) 100 MG tablet Take 100 mg by mouth daily.      aspirin 81 MG chewable tablet Chew 81 mg by mouth daily.      divalproex (DEPAKOTE) 250 MG DR tablet Take 250-500 mg by mouth 2 (two) times daily. 1 tablet every morning and 2 tablets every evening     gabapentin (NEURONTIN) 800 MG tablet Take 800 mg by mouth 3 (three) times daily.      oxybutynin (DITROPAN) 5 MG tablet Take 5 mg by mouth 3 (three) times daily.        pravastatin (PRAVACHOL) 40 MG tablet TAKE ONE TABLET BY MOUTH EVERY DAY(PT NEEDS OFFICE VISIT FOR MORE REFILLS) Qty: 30 tablet, Refills: 6      STOP taking these medications     donepezil (ARICEPT) 5 MG tablet      hydrochlorothiazide (HYDRODIURIL) 25 MG tablet         Disposition and Follow-up:  Follow up with PCP in 1 wek  follow up with ENT in 2 weeks  Consults:  [name] ( ENT)  Significant Diagnostic Studies:  Ct Soft Tissue Neck W Contrast  05/10/2011  *RADIOLOGY REPORT*  Clinical Data:  left mandible and neck swelling  CT NECK WITH CONTRAST  Technique:  Multidetector CT imaging of the neck was performed with intravenous contrast.  Contrast: 75mL OMNIPAQUE IOHEXOL 300 MG/ML IV SOLN  Comparison: Head CT dated January 03, 2011  Findings: There is diffuse swelling of the left parotid gland and left submandibular gland, as well as the adjacent masseter muscle and platysma muscle, along with the subcutaneous fat.  There are two small necrotic lesions within the left parotid gland which measure 2.1 cm and 0.9 cm which could represent developing abscesses or necrotic lymph nodes.  Small reactive lymph nodes are present throughout the  left neck.  There is a small amount of mass effect on the left in the left parapharyngeal space secondary to the inflammation.  No abnormal soft tissue gas or salivary gland calculi are identified.  Incidental note is made of a 1.9 cm oval soft tissue mass within the posterior right parotid gland which is most consistent with a benign Warthin's tumor and was present and unchanged compared to the PET scan from June 24, 2005.  Small right basal ganglia lacuna infarct is again noted.  The orbits and calvarium are unremarkable.  There are emphysematous changes at the lung apices.  Cervical spondylosis is present with uncinate process and facet joint hypertrophy.  IMPRESSION: Left parotiditis with inflammation extending into the adjacent musculature and  submandibular gland.  There are two small lesions within the left parotid gland which represent either developing abscesses or necrotic lymph nodes.  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  Original Report Authenticated By: Brandon Melnick, M.D.    Brief H and P: For complete details please refer to admission H and P, but in brief 78 y/o male with hx of HTN, CVA, vascular dementia, prostate ca and active smoker who presented with 2 week hx of pain and swelling of left cheek with difficulty chewing and moving his jaw . Patient denies any fever or chills, denies any trauma,, denies any insect bite or recent change in medciations. He denies any difficulty breathing, nausea , vomiting . denies any headache or earache. deneis blurring or vision. A maxillofacial CT done in ED showed Left parotiditis with inflammation extending into the adjacent musculature and submandibular gland with an early developing deep tissue abscess. patient admitted to medical floor for further management.      Physical Exam on Discharge:  Filed Vitals:   05/12/11 0548 05/12/11 1514 05/12/11 2200 05/13/11 0600  BP: 142/70 141/61 151/57 166/69  Pulse: 63 49 53 51  Temp: 98.4 F (36.9 C) 98.2 F (36.8 C) 98.3 F (36.8 C) 98.3 F (36.8 C)  TempSrc: Oral  Oral Oral  Resp: 18 20 18 18   Height:      Weight:      SpO2: 98% 97% 96% 98%     Intake/Output Summary (Last 24 hours) at 05/13/11 1028 Last data filed at 05/13/11 0900  Gross per 24 hour  Intake   1867 ml  Output      0 ml  Net   1867 ml    HEENT: swelling over left parotid which has much reduced and  improved from admission.  Non tender. Oral cavity wnl with dry mucosa, no pallor, no icterus, no lymphadenopathy  Cardiovascular: RRR, S1 normal, S2 normal, no MRG,  Pulmonary/Chest: CTAB, no wheezes, rales, or rhonchi  Abdominal: Soft. Non-tender, non-distended, bowel sounds are normal, no masses,  organomegaly, or guarding present.  GU: no CVA tenderness Musculoskeletal: No joint deformities, erythema, or stiffness, ROM full and no nontender Ext: no edema and no cyanosis, pulses palpable bilaterally (DP and PT)  Hematology: no cervical, inginal, or axillary adenopathy.  Neurological: A&O x3, NON FOCAL CBC:    Component Value Date/Time   WBC 7.9 05/10/2011 0605   HGB 13.6 05/10/2011 0620   HCT 40.0 05/10/2011 0620   PLT 209 05/10/2011 0605   MCV 78.5 05/10/2011 0605   NEUTROABS 5.7 05/10/2011 0605   LYMPHSABS 1.3 05/10/2011 0605   MONOABS 0.6 05/10/2011 0605   EOSABS 0.2 05/10/2011 0605   BASOSABS 0.0 05/10/2011  1610    Basic Metabolic Panel:    Component Value Date/Time   NA 135 05/13/2011 0635   K 4.1 05/13/2011 0635   CL 102 05/13/2011 0635   CO2 22 05/13/2011 0635   BUN 20 05/13/2011 0635   CREATININE 1.09 05/13/2011 0635   GLUCOSE 89 05/13/2011 0635   CALCIUM 7.9* 05/13/2011 0635    Hospital Course:   Acute left parotitis  patient admitted to medial floor  CT maxillofaical showed left parotitidis with inflammation extending into the adjacent  musculature and submandibular gland. There are two small lesions  within the left parotid gland which represent either developing  abscesses or necrotic lymph nodes. 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  patient given a dose of IV decadron in the ED and a dose of IV vanco and unasyn  seen by ENT consult in ED and recommend continuigng IV abx ( continued on IV vanco and unasyn) , 3 doses of IV decadron followed by prednisone po and taper, warm compress and sialogogues to improve salivery flow.  Patient improving and left parotid swelling much reduced and non tender while on pain medications  -he is tolerating advanced diet Switched to po prednisone and will discharged on a tapered dose He will be transitioned to po doxycycline and will be discharged on it to complete a 2 week course  Hyponatremia  Na dropped to 128 , now  improved. Given IV fluids HCTZ was held for hyponatremia. It can be resumed as outpatient, if Na level remains stable  HTN  Increased dose of amlodipine and lisinopril Hold HCTZ given hyponatremia  Bradycardia  noted for HR in mid 40s to 50s. Not on BB  D/ced morphine  Switched aricept to namenda as Bradycardia is one of common side effects of aricept  EKG shows sinus brady with PACs and LVH  Hx of CVA  cont ASA and statins  cotn depakote   vascular dementia  Aricept swiched to namenda   Patient clinically;y stable for discharge home with outpatient follow up.   Time spent on Discharge: 45 minutes  Signed: [name] 05/13/2011, 10:28 AM

## 2011-05-13 NOTE — Progress Notes (Signed)
DC home with daughter. DC instructions  Verbally understood by family

## 2011-05-13 NOTE — Progress Notes (Signed)
   CARE MANAGEMENT NOTE 05/13/2011  Patient:  Manuel King, Manuel King   Account Number:  [id number]  Date Initiated:  05/13/2011  Documentation initiated by:  Letha Cape  Subjective/Objective Assessment:   dx acute parotiis  admit     Action/Plan:   Anticipated DC Date:  05/13/2011   Anticipated DC Plan:  HOME/SELF CARE      DC Planning Services  CM consult      Choice offered to / List presented to:             Status of service:  Completed, signed off Medicare Important Message given?   (If response is "NO", the following Medicare IM given date fields will be blank) Date Medicare IM given:   Date Additional Medicare IM given:    Discharge Disposition:  HOME/SELF CARE  Per UR Regulation:    Comments:  05/13/11 15:36 Letha Cape RN, BSN [phone] patient for dc today, pta independent.

## 2011-05-13 NOTE — Progress Notes (Signed)
Subjective: Admitted for left acute left parotitis. Doing well, afebrile with significantly improved left parotid edema, erythema. Patient feels much better.  Objective: Vital signs in last 24 hours: Temp:  [98.2 F (36.8 C)-98.3 F (36.8 C)] 98.3 F (36.8 C) (01/08 0600) Pulse Rate:  [49-53] 51  (01/08 0600) Resp:  [18-20] 18  (01/08 0600) BP: (141-166)/(57-69) 166/69 mmHg (01/08 0600) SpO2:  [96 %-98 %] 98 % (01/08 0600)  Facial nerve is House-Brackmann 1/6 bilaterally in all divisions, left parotid edema/erythema is significantly improved with a residual 4-5 cm area in the tail of parotid that continues to be firm and indurated, but tenderness to palpation is greatly improved.  Basename 05/13/11 0635 05/12/11 0545  NA 135 130*  K 4.1 4.1  CL 102 97  CO2 22 22  GLUCOSE 89 138*  BUN 20 21  CREATININE 1.09 1.03  CALCIUM 7.9* 7.9*      Assessment/Plan: Improving left acute parotitis. Continue IV antibiotics and monitor for continued clinical improvement. Recommend transition to prednisone taper and PO Doxycycline when discharged.   LOS: 3 days   [name] 05/13/2011, 10:10 AM

## 2011-07-16 ENCOUNTER — Other Ambulatory Visit: Payer: Self-pay | Admitting: Internal Medicine

## 2011-07-16 NOTE — Telephone Encounter (Signed)
Patient needs appt

## 2011-07-28 ENCOUNTER — Other Ambulatory Visit: Payer: Self-pay | Admitting: Internal Medicine

## 2011-09-09 ENCOUNTER — Emergency Department (HOSPITAL_COMMUNITY)
Admission: EM | Admit: 2011-09-09 | Discharge: 2011-09-09 | Disposition: A | Discharge status: Home Health | Payer: Medicare Other | Source: Home / Self Care | Attending: Emergency Medicine | Admitting: Emergency Medicine

## 2011-09-09 ENCOUNTER — Encounter (HOSPITAL_COMMUNITY): Payer: Self-pay | Admitting: *Deleted

## 2011-09-09 ENCOUNTER — Emergency Department (HOSPITAL_COMMUNITY): Payer: Medicare Other

## 2011-09-09 DIAGNOSIS — R21 Rash and other nonspecific skin eruption: Secondary | ICD-10-CM | POA: Insufficient documentation

## 2011-09-09 DIAGNOSIS — M79609 Pain in unspecified limb: Secondary | ICD-10-CM

## 2011-09-09 DIAGNOSIS — I10 Essential (primary) hypertension: Secondary | ICD-10-CM | POA: Insufficient documentation

## 2011-09-09 DIAGNOSIS — L02419 Cutaneous abscess of limb, unspecified: Secondary | ICD-10-CM | POA: Insufficient documentation

## 2011-09-09 DIAGNOSIS — R0989 Other specified symptoms and signs involving the circulatory and respiratory systems: Secondary | ICD-10-CM

## 2011-09-09 DIAGNOSIS — M25473 Effusion, unspecified ankle: Secondary | ICD-10-CM | POA: Insufficient documentation

## 2011-09-09 DIAGNOSIS — M25579 Pain in unspecified ankle and joints of unspecified foot: Secondary | ICD-10-CM | POA: Insufficient documentation

## 2011-09-09 DIAGNOSIS — Z8546 Personal history of malignant neoplasm of prostate: Secondary | ICD-10-CM | POA: Insufficient documentation

## 2011-09-09 DIAGNOSIS — F0390 Unspecified dementia without behavioral disturbance: Secondary | ICD-10-CM | POA: Insufficient documentation

## 2011-09-09 DIAGNOSIS — Z8673 Personal history of transient ischemic attack (TIA), and cerebral infarction without residual deficits: Secondary | ICD-10-CM | POA: Insufficient documentation

## 2011-09-09 DIAGNOSIS — M7989 Other specified soft tissue disorders: Secondary | ICD-10-CM

## 2011-09-09 DIAGNOSIS — M25476 Effusion, unspecified foot: Secondary | ICD-10-CM | POA: Insufficient documentation

## 2011-09-09 DIAGNOSIS — L0390 Cellulitis, unspecified: Secondary | ICD-10-CM

## 2011-09-09 DIAGNOSIS — Z7982 Long term (current) use of aspirin: Secondary | ICD-10-CM | POA: Insufficient documentation

## 2011-09-09 DIAGNOSIS — Z79899 Other long term (current) drug therapy: Secondary | ICD-10-CM | POA: Insufficient documentation

## 2011-09-09 LAB — DIFFERENTIAL
Basophils Absolute: 0 10*3/uL (ref 0.0–0.1)
Eosinophils Absolute: 0.2 10*3/uL (ref 0.0–0.7)
Eosinophils Relative: 2 % (ref 0–5)
Monocytes Absolute: 0.7 10*3/uL (ref 0.1–1.0)

## 2011-09-09 LAB — CBC
HCT: 40 % (ref 39.0–52.0)
MCH: 26.3 pg (ref 26.0–34.0)
MCHC: 33 g/dL (ref 30.0–36.0)
MCV: 79.8 fL (ref 78.0–100.0)
Platelets: 204 10*3/uL (ref 150–400)
RDW: 18.1 % — ABNORMAL HIGH (ref 11.5–15.5)

## 2011-09-09 LAB — BASIC METABOLIC PANEL
CO2: 25 mEq/L (ref 19–32)
Calcium: 9.2 mg/dL (ref 8.4–10.5)
Creatinine, Ser: 1.34 mg/dL (ref 0.50–1.35)
GFR calc non Af Amer: 49 mL/min — ABNORMAL LOW (ref >90)
Sodium: 136 mEq/L (ref 135–145)

## 2011-09-09 MED ORDER — TRAMADOL HCL 50 MG PO TABS
50.0000 mg | ORAL_TABLET | Freq: Once | ORAL | Status: AC
  Administered 2011-09-09: 50 mg via ORAL
  Filled 2011-09-09: qty 1

## 2011-09-09 MED ORDER — SULFAMETHOXAZOLE-TMP DS 800-160 MG PO TABS
2.0000 | ORAL_TABLET | Freq: Once | ORAL | Status: AC
  Administered 2011-09-09: 2 via ORAL
  Filled 2011-09-09: qty 2

## 2011-09-09 MED ORDER — CEPHALEXIN 500 MG PO CAPS: 500.0000 mg | ORAL_CAPSULE | Freq: Four times a day (QID) | ORAL | Status: DC

## 2011-09-09 MED ORDER — TRAMADOL HCL 50 MG PO TABS: 50.0000 mg | ORAL_TABLET | Freq: Four times a day (QID) | ORAL | Status: AC | PRN

## 2011-09-09 MED ORDER — SULFAMETHOXAZOLE-TMP DS 800-160 MG PO TABS: 2.0000 | ORAL_TABLET | Freq: Two times a day (BID) | ORAL | Status: DC

## 2011-09-09 MED ORDER — CEPHALEXIN 250 MG PO CAPS
250.0000 mg | ORAL_CAPSULE | Freq: Once | ORAL | Status: AC
  Administered 2011-09-09: 250 mg via ORAL
  Filled 2011-09-09: qty 1

## 2011-09-09 NOTE — Progress Notes (Signed)
VASCULAR LAB PRELIMINARY  PRELIMINARY  PRELIMINARY  PRELIMINARY  Right lower extremity venous duplex completed.    Preliminary report:  Right:  No evidence of DVT, superficial thrombosis, or Baker's cyst.  Terance Hart, RVT 09/09/2011, 4:04 PM

## 2011-09-09 NOTE — Discharge Instructions (Signed)

## 2011-09-09 NOTE — ED Provider Notes (Addendum)
History     CSN: [id number]  Arrival date & time 09/09/11  1301   First MD Initiated Contact with Patient 09/09/11 1513      Chief Complaint  Patient presents with  . Ankle Pain  . Joint Swelling    (Consider location/radiation/quality/duration/timing/severity/associated sxs/prior treatment) HPI Patient is a 78 year old male who presents today complaining of right ankle pain and swelling since he woke this morning. Patient has a history of gout but has not been on allopurinol for some time. Family denies fevers, nausea, or vomiting. Patient has no other symptoms. Right foot is warmer to the touch and erythematous in comparison with the left foot. Patient does not have any obvious point of entry for infection but does have multiple skin cracks on the foot. Patient has no history of trauma or deformity. Patient has no history of DVT or PE. He reports pain is an 8/10. He has not taken anything for this prior to arrival. Patient is not a diabetic and has no history of immunocompromise.There are no other associated or modifying factors.  Past Medical History  Diagnosis Date  . Hypertension   . CVA (cerebral vascular accident)   . Dementia   . Prostate cancer     Past Surgical History  Procedure Date  . Neck surgery   . Back surgery     Family History  Problem Relation Age of Onset  . Hypotension Mother   . Hypotension Father     History  Substance Use Topics  . Smoking status: Current Everyday Smoker -- 15 years    Types: Cigars  . Smokeless tobacco: Not on file  . Alcohol Use: 0.6 oz/week    1 Glasses of wine per week      Review of Systems  Constitutional: Negative.   HENT: Negative.   Eyes: Negative.   Respiratory: Negative.   Cardiovascular: Negative.   Gastrointestinal: Negative.   Genitourinary: Negative.   Musculoskeletal:       See history of present illness  Skin: Positive for rash.  Neurological: Negative.   Hematological: Negative.     Psychiatric/Behavioral: Negative.   All other systems reviewed and are negative.    Allergies  Review of patient's allergies indicates no known allergies.  Home Medications   Current Outpatient Rx  Name Route Sig Dispense Refill  . AMLODIPINE BESYLATE 5 MG PO TABS Oral Take 5 mg by mouth daily.    . ASPIRIN EC 81 MG PO TBEC Oral Take 81 mg by mouth daily.    Marland Kitchen DIVALPROEX SODIUM 250 MG PO TBEC Oral Take 250 mg by mouth 3 (three) times daily.     . DONEPEZIL HCL 5 MG PO TABS Oral Take 5 mg by mouth daily.    Marland Kitchen GABAPENTIN 800 MG PO TABS Oral Take 800 mg by mouth 3 (three) times daily.      Marland Kitchen HYDROCHLOROTHIAZIDE 25 MG PO TABS Oral Take 12.5 mg by mouth daily.    Marland Kitchen LISINOPRIL 10 MG PO TABS Oral Take 10 mg by mouth daily.    . OXYBUTYNIN CHLORIDE ER 10 MG PO TB24 Oral Take 10 mg by mouth 3 (three) times daily.    Marland Kitchen PRAVASTATIN SODIUM 40 MG PO TABS  TAKE ONE TABLET BY MOUTH EVERY DAY NEEDS  OFFICE  VISIT  FOR  MORE  REFILLS 30 tablet 3    BP 134/67  Pulse 69  Temp(Src) 98 F (36.7 C) (Oral)  SpO2 100%  Physical Exam  Nursing note and vitals  reviewed. GEN: Well-developed, well-nourished male in no distress HEENT: Atraumatic, normocephalic. Oropharynx clear without erythema EYES: PERRLA BL, no scleral icterus. NECK: Trachea midline, no meningismus CV: regular rate and rhythm. No murmurs, rubs, or gallops PULM: No respiratory distress.  No crackles, wheezes, or rales. GI: soft, non-tender. No guarding, rebound, or tenderness. + bowel sounds  GU: deferred Neuro: cranial nerves 2-12 intact, no abnormalities of strength or sensation, A and O x 3 MSK: Patient moves all 4 extremities symmetrically, no deformity. Right foot and ankle are slightly more erythematous and warmer to the touch compared to left foot. Patient has 1+ DP and PT pulses. There is no palpable joint effusion or deformity. Erythema extends just proximal to the ankle by 2 inches. Patient has no calf tenderness on the  right. Skin: No rashes petechiae, purpura, or jaundice aside from findings noted in musculoskeletal exam on the right foot. Psych: no abnormality of mood   ED Course  Procedures (including critical care time)  Labs Reviewed  CBC - Abnormal; Notable for the following:    RDW 18.1 (*)    All other components within normal limits  DIFFERENTIAL  BASIC METABOLIC PANEL   Dg Ankle Complete Right  09/09/2011  *RADIOLOGY REPORT*  Clinical Data: Pain  RIGHT ANKLE - COMPLETE 3+ VIEW  Comparison: None.  Findings: There is no evidence of bone, joint, or soft tissue abnormality.  IMPRESSION: Negative right ankle.  Original Report Authenticated By: Brandon Melnick, M.D.   Dg Foot Complete Right  09/09/2011  *RADIOLOGY REPORT*  Clinical Data: Right-sided foot pain radiating through metatarsals. Ankle swelling.No injury.  RIGHT FOOT COMPLETE - 3+ VIEW  Comparison: None.  Findings: Mild hallux valgus with moderate first MTP joint osteoarthritis.  Diffuse osteopenia is present.  Dystrophic proximal phalanx of the small toe incidentally noted, of likely no clinical significance.  Mild midfoot osteoarthritis is present. The no fracture. Dystrophic great toe toenail, likely an onychomycosis.  IMPRESSION: Osteopenia and first MTP joint osteoarthritis.  No acute osseous abnormality.  Original Report Authenticated By: Andreas Newport, M.D.     1. Cellulitis       MDM  Patient had no history of trauma and negative plain films.  Left lower extremity was definitely warmer and more erythematous then left.  Doppler was performed and showed no evidence of clot.  Patient was provided with some IV hydration here in the ED given elevated BUN/Cr ratio.  Keflex and Bactrim were given and ultram was prescribed for pain.  Patient was discharged home with family in good condition and patient ad family were comfortable with plan.        Cyndra Numbers, MD 09/10/11 1113  Cyndra Numbers, MD 09/10/11 1113

## 2011-09-09 NOTE — ED Notes (Signed)
Per EMS- pt in c/o right ankle pain and swelling since this am, states he turned over in bed and pain started, denies known injury or fall

## 2011-09-12 ENCOUNTER — Encounter (HOSPITAL_COMMUNITY): Payer: Self-pay | Admitting: *Deleted

## 2011-09-12 ENCOUNTER — Emergency Department (HOSPITAL_COMMUNITY): Payer: Medicare Other

## 2011-09-12 ENCOUNTER — Inpatient Hospital Stay (HOSPITAL_COMMUNITY)
Admission: EM | Admit: 2011-09-12 | Discharge: 2011-09-17 | DRG: 194 | Disposition: A | Discharge status: Skilled Nursing Facility | Payer: Medicare Other | Attending: Internal Medicine | Admitting: Internal Medicine

## 2011-09-12 DIAGNOSIS — I739 Peripheral vascular disease, unspecified: Secondary | ICD-10-CM | POA: Diagnosis present

## 2011-09-12 DIAGNOSIS — F29 Unspecified psychosis not due to a substance or known physiological condition: Secondary | ICD-10-CM | POA: Diagnosis not present

## 2011-09-12 DIAGNOSIS — R5381 Other malaise: Secondary | ICD-10-CM

## 2011-09-12 DIAGNOSIS — I672 Cerebral atherosclerosis: Secondary | ICD-10-CM | POA: Diagnosis present

## 2011-09-12 DIAGNOSIS — J189 Pneumonia, unspecified organism: Principal | ICD-10-CM

## 2011-09-12 DIAGNOSIS — N179 Acute kidney failure, unspecified: Secondary | ICD-10-CM

## 2011-09-12 DIAGNOSIS — E860 Dehydration: Secondary | ICD-10-CM

## 2011-09-12 DIAGNOSIS — R296 Repeated falls: Secondary | ICD-10-CM

## 2011-09-12 DIAGNOSIS — E785 Hyperlipidemia, unspecified: Secondary | ICD-10-CM

## 2011-09-12 DIAGNOSIS — R6251 Failure to thrive (child): Secondary | ICD-10-CM

## 2011-09-12 DIAGNOSIS — Z8546 Personal history of malignant neoplasm of prostate: Secondary | ICD-10-CM

## 2011-09-12 DIAGNOSIS — K1121 Acute sialoadenitis: Secondary | ICD-10-CM

## 2011-09-12 DIAGNOSIS — Z9181 History of falling: Secondary | ICD-10-CM

## 2011-09-12 DIAGNOSIS — R4182 Altered mental status, unspecified: Secondary | ICD-10-CM

## 2011-09-12 DIAGNOSIS — F0150 Vascular dementia without behavioral disturbance: Secondary | ICD-10-CM | POA: Diagnosis present

## 2011-09-12 DIAGNOSIS — F17200 Nicotine dependence, unspecified, uncomplicated: Secondary | ICD-10-CM | POA: Diagnosis present

## 2011-09-12 DIAGNOSIS — M6282 Rhabdomyolysis: Secondary | ICD-10-CM | POA: Diagnosis present

## 2011-09-12 DIAGNOSIS — W19XXXA Unspecified fall, initial encounter: Secondary | ICD-10-CM

## 2011-09-12 DIAGNOSIS — M79609 Pain in unspecified limb: Secondary | ICD-10-CM | POA: Diagnosis present

## 2011-09-12 DIAGNOSIS — L02619 Cutaneous abscess of unspecified foot: Secondary | ICD-10-CM | POA: Diagnosis present

## 2011-09-12 DIAGNOSIS — M109 Gout, unspecified: Secondary | ICD-10-CM

## 2011-09-12 DIAGNOSIS — I498 Other specified cardiac arrhythmias: Secondary | ICD-10-CM

## 2011-09-12 DIAGNOSIS — Z8673 Personal history of transient ischemic attack (TIA), and cerebral infarction without residual deficits: Secondary | ICD-10-CM

## 2011-09-12 DIAGNOSIS — I10 Essential (primary) hypertension: Secondary | ICD-10-CM

## 2011-09-12 DIAGNOSIS — G4730 Sleep apnea, unspecified: Secondary | ICD-10-CM

## 2011-09-12 DIAGNOSIS — L03119 Cellulitis of unspecified part of limb: Secondary | ICD-10-CM | POA: Diagnosis present

## 2011-09-12 DIAGNOSIS — N289 Disorder of kidney and ureter, unspecified: Secondary | ICD-10-CM

## 2011-09-12 DIAGNOSIS — R627 Adult failure to thrive: Secondary | ICD-10-CM | POA: Diagnosis present

## 2011-09-12 HISTORY — DX: Cellulitis, unspecified: L03.90

## 2011-09-12 LAB — CBC
HCT: 33.1 % — ABNORMAL LOW (ref 39.0–52.0)
Hemoglobin: 11 g/dL — ABNORMAL LOW (ref 13.0–17.0)
MCH: 26.4 pg (ref 26.0–34.0)
MCH: 26.5 pg (ref 26.0–34.0)
MCHC: 33.2 g/dL (ref 30.0–36.0)
MCV: 79.6 fL (ref 78.0–100.0)
MCV: 79.4 fL (ref 78.0–100.0)
Platelets: 188 10*3/uL (ref 150–400)
RBC: 4.12 MIL/uL — ABNORMAL LOW (ref 4.22–5.81)
RDW: 18.1 % — ABNORMAL HIGH (ref 11.5–15.5)
RDW: 18 % — ABNORMAL HIGH (ref 11.5–15.5)

## 2011-09-12 LAB — COMPREHENSIVE METABOLIC PANEL
Albumin: 2.9 g/dL — ABNORMAL LOW (ref 3.5–5.2)
BUN: 48 mg/dL — ABNORMAL HIGH (ref 6–23)
Calcium: 8.2 mg/dL — ABNORMAL LOW (ref 8.4–10.5)
Chloride: 104 mEq/L (ref 96–112)
Creatinine, Ser: 2.74 mg/dL — ABNORMAL HIGH (ref 0.50–1.35)
Total Bilirubin: 0.2 mg/dL — ABNORMAL LOW (ref 0.3–1.2)

## 2011-09-12 LAB — DIFFERENTIAL
Basophils Relative: 0 % (ref 0–1)
Eosinophils Absolute: 0.1 10*3/uL (ref 0.0–0.7)
Eosinophils Relative: 1 % (ref 0–5)
Monocytes Absolute: 0.5 10*3/uL (ref 0.1–1.0)
Monocytes Relative: 6 % (ref 3–12)
Neutro Abs: 5.5 10*3/uL (ref 1.7–7.7)

## 2011-09-12 LAB — TROPONIN I: Troponin I: <0.3 ng/mL (ref <0.30)

## 2011-09-12 LAB — URINALYSIS, ROUTINE W REFLEX MICROSCOPIC
Glucose, UA: NEGATIVE mg/dL
Leukocytes, UA: NEGATIVE
Protein, ur: NEGATIVE mg/dL
Specific Gravity, Urine: 1.022 (ref 1.005–1.030)
Urobilinogen, UA: 0.2 mg/dL (ref 0.0–1.0)

## 2011-09-12 MED ORDER — SIMVASTATIN 5 MG PO TABS
5.0000 mg | ORAL_TABLET | Freq: Every day | ORAL | Status: DC
Start: 2011-09-13 — End: 2011-09-17
  Administered 2011-09-13 – 2011-09-16 (×4): 5 mg via ORAL
  Filled 2011-09-12 (×5): qty 1

## 2011-09-12 MED ORDER — TRAMADOL HCL 50 MG PO TABS
50.0000 mg | ORAL_TABLET | Freq: Two times a day (BID) | ORAL | Status: DC | PRN
  Administered 2011-09-12 – 2011-09-14 (×3): 50 mg via ORAL
  Filled 2011-09-12 (×3): qty 1

## 2011-09-12 MED ORDER — ACETAMINOPHEN 325 MG PO TABS
650.0000 mg | ORAL_TABLET | Freq: Four times a day (QID) | ORAL | Status: DC | PRN
  Administered 2011-09-13 – 2011-09-16 (×6): 650 mg via ORAL
  Filled 2011-09-12 (×6): qty 2

## 2011-09-12 MED ORDER — PREDNISONE 20 MG PO TABS
20.0000 mg | ORAL_TABLET | Freq: Two times a day (BID) | ORAL | Status: DC
  Administered 2011-09-12 – 2011-09-13 (×3): 20 mg via ORAL
  Filled 2011-09-12 (×5): qty 1

## 2011-09-12 MED ORDER — ENOXAPARIN SODIUM 40 MG/0.4ML ~~LOC~~ SOLN
30.0000 mg | SUBCUTANEOUS | Status: DC
  Administered 2011-09-12: 30 mg via SUBCUTANEOUS
  Filled 2011-09-12 (×2): qty 0.4

## 2011-09-12 MED ORDER — IPRATROPIUM BROMIDE 0.02 % IN SOLN
0.5000 mg | Freq: Four times a day (QID) | RESPIRATORY_TRACT | Status: DC
  Administered 2011-09-12: 0.5 mg via RESPIRATORY_TRACT
  Filled 2011-09-12: qty 2.5

## 2011-09-12 MED ORDER — ACETAMINOPHEN 650 MG RE SUPP: 650.0000 mg | Freq: Four times a day (QID) | RECTAL | Status: DC | PRN

## 2011-09-12 MED ORDER — SODIUM CHLORIDE 0.9 % IV SOLN
INTRAVENOUS | Status: DC
  Administered 2011-09-12 – 2011-09-15 (×4): via INTRAVENOUS

## 2011-09-12 MED ORDER — CIPROFLOXACIN IN D5W 400 MG/200ML IV SOLN
400.0000 mg | INTRAVENOUS | Status: DC
  Administered 2011-09-12: 400 mg via INTRAVENOUS
  Filled 2011-09-12: qty 200

## 2011-09-12 MED ORDER — ONDANSETRON HCL 4 MG PO TABS: 4.0000 mg | ORAL_TABLET | Freq: Four times a day (QID) | ORAL | Status: DC | PRN

## 2011-09-12 MED ORDER — SODIUM CHLORIDE 0.9 % IJ SOLN
3.0000 mL | Freq: Two times a day (BID) | INTRAMUSCULAR | Status: DC
  Administered 2011-09-12 – 2011-09-14 (×2): 3 mL via INTRAVENOUS

## 2011-09-12 MED ORDER — OXYBUTYNIN CHLORIDE ER 10 MG PO TB24
10.0000 mg | ORAL_TABLET | Freq: Three times a day (TID) | ORAL | Status: DC
  Administered 2011-09-12 – 2011-09-17 (×14): 10 mg via ORAL
  Filled 2011-09-12 (×16): qty 1

## 2011-09-12 MED ORDER — ONDANSETRON HCL 4 MG/2ML IJ SOLN: 4.0000 mg | Freq: Four times a day (QID) | INTRAMUSCULAR | Status: DC | PRN

## 2011-09-12 MED ORDER — SODIUM CHLORIDE 0.9 % IV BOLUS (SEPSIS)
1000.0000 mL | Freq: Once | INTRAVENOUS | Status: AC
  Administered 2011-09-12: 1000 mL via INTRAVENOUS

## 2011-09-12 MED ORDER — ALBUTEROL SULFATE (5 MG/ML) 0.5% IN NEBU
2.5000 mg | INHALATION_SOLUTION | RESPIRATORY_TRACT | Status: DC | PRN
  Administered 2011-09-12: 2.5 mg via RESPIRATORY_TRACT
  Filled 2011-09-12: qty 0.5

## 2011-09-12 NOTE — ED Notes (Signed)
[id number]<JY> Expected date:09/12/11<BR> Expected time:<BR> Means of arrival:<BR> Comments:<BR> EMS 33 GC - gi bleed

## 2011-09-12 NOTE — Progress Notes (Signed)
Noted CM consult from admission Rn spoke with pt, wife and daughter who states pt ordered a medication for his bladder by Dr Brunilda Payor that has cost $45 with [facility] medicare complete coverage.  Left message for Dr Brunilda Payor but no return call at this time to change medication, etc. CM reviewed [employer] medication tier program and general cost, need to have medication changed to lower tier, possible assist with "green card" from cancer center or assist from [employer] (CPR) program. All questions answered Provided written information for [employer] (CPR) Voiced understanding and appreciation of services and will follow up with Dr Brunilda Payor, cancer center, [facility] and [employer] (CPR)

## 2011-09-12 NOTE — H&P (Signed)
Manuel King MRN: [id number] DOB/AGE: Jun 17, 1929 78 y.o. Primary Care Physician:RAMACHANDRAN,[name], MD, MD Admit date: 09/12/2011  PRIMARY CARE PHYSICIAN: [name], MD  PRIMARY CARDIOLOGIST: New, [name], MD, [facility] Chief Complaint:  Weakness   HPI:78 y/o male with hx of HTN, CVA, vascular dementia, prostate ca , gout and active smoker who presented with history of feeling weak, inability to ambulate. He was in the ER on Tuesday with right foot pain, he was treated for cellulitis and was prescribed Keflex and Bactrim and Ultram for pain. He has taken it for 3 days. Over the course of the last 3 days the patient has had increasing weakness, generalized tremors, low-grade fever at home. He also has been answering questions inappropriately. He was seen by his primary care provider today, will follow the patient should be evaluated in the ED for possibility of stroke. The patient was on allopurinol up until January and it was discontinued by his primary care provider.  Past Medical History  Diagnosis Date  . Hypertension   . CVA (cerebral vascular accident)   . Dementia   . Prostate cancer   . Cellulitis currently    right foot    Past Surgical History  Procedure Date  . Neck surgery   . Back surgery   . Spider bite surgery     on left leg  . Prostate seed implants     Prior to Admission medications   Medication Sig Start Date End Date Taking? Authorizing Provider  allopurinol (ZYLOPRIM) 100 MG tablet Take 100 mg by mouth daily.   Yes Historical Provider, MD  amLODipine (NORVASC) 5 MG tablet Take 5 mg by mouth daily.   Yes Historical Provider, MD  aspirin EC 81 MG tablet Take 81 mg by mouth daily.   Yes Historical Provider, MD  divalproex (DEPAKOTE) 250 MG DR tablet Take 250 mg by mouth 2 (two) times daily. Take one tablet in the am and 2 tablets in the pm   Yes Historical Provider, MD  donepezil (ARICEPT) 5 MG tablet Take 5 mg by mouth daily.   Yes Historical Provider,  MD  gabapentin (NEURONTIN) 800 MG tablet Take 800 mg by mouth 3 (three) times daily.     Yes Historical Provider, MD  hydrochlorothiazide (HYDRODIURIL) 25 MG tablet Take 12.5 mg by mouth daily. Take 1/2 tablet by mouth once daily   Yes Historical Provider, MD  lisinopril (PRINIVIL,ZESTRIL) 10 MG tablet Take 10 mg by mouth daily.   Yes Historical Provider, MD  multivitamin [facility]) per tablet Take 1 tablet by mouth daily.   Yes Historical Provider, MD  oxybutynin (DITROPAN-XL) 10 MG 24 hr tablet Take 10 mg by mouth 3 (three) times daily.   Yes Historical Provider, MD  pravastatin (PRAVACHOL) 40 MG tablet TAKE ONE TABLET BY MOUTH EVERY DAY NEEDS  OFFICE  VISIT  FOR  MORE  REFILLS 07/28/11  Yes [name], MD  traMADol (ULTRAM) 50 MG tablet Take 1 tablet (50 mg total) by mouth every 6 (six) hours as needed for pain. 09/09/11 09/19/11 Yes [name], MD    Allergies: No Known Allergies  Family History  Problem Relation Age of Onset  . Hypotension Mother   . Hypotension Father     Social History:  reports that he has been smoking Cigars.  He has never used smokeless tobacco. He reports that he drinks about .6 ounces of alcohol per week. He reports that he does not use illicit drugs.  [id number]: Denies fever, chills, diaphoresis, appetite change and fatigue.  HEENT: HENT: Negative for neck pain, hearing loss, ear pain, congestion, sore throat, rhinorrhea, sneezing, mouth sores, neck pain, neck stiffness and tinnitus.  Respiratory: Denies SOB, DOE, cough, chest tightness, and wheezing.  Cardiovascular: Denies chest pain, palpitations and leg swelling.  Gastrointestinal: Denies nausea, vomiting, abdominal pain, diarrhea, constipation, blood in stool and abdominal distention.  Genitourinary: Denies dysuria, urgency, frequency, hematuria, flank pain and difficulty urinating.  Musculoskeletal: Denies myalgias, back pain, joint swelling, arthralgias and gait problem.  Skin:  Denies pallor, rash and wound.  Neurological: Denies dizziness, seizures, syncope, positive for weakness, light-headedness, numbness and headaches.  Hematological: Denies adenopathy. Easy bruising, personal or family bleeding history  Psychiatric/Behavioral: Denies suicidal ideation, mood changes, confusion, nervousness, sleep disturbance and agitation   PHYSICAL EXAM: Blood pressure 119/78, pulse 76, temperature 98.1 F (36.7 C), temperature source Oral, resp. rate 17, height 6\' 4"  (1.93 m), weight 91 kg (200 lb 9.9 oz), SpO2 95.00%. Nursing note and vitals reviewed.  Constitutional: He appears well-developed and well-nourished.  HENT:  Head: Atraumatic.  Nose: Nose normal.  Mouth/Throat: Oropharynx is clear and moist.  Eyes: Conjunctivae are normal. Pupils are equal, round, and reactive to light. No scleral icterus.  Neck: Normal range of motion. Neck supple. No tracheal deviation present.  Cardiovascular: Normal rate, regular rhythm, normal heart sounds and intact distal pulses.  Pulmonary/Chest: Effort normal and breath sounds normal. No accessory muscle usage. No respiratory distress.  Abdominal: Soft. Bowel sounds are normal. He exhibits no distension and no mass. There is no tenderness. There is no rebound and no guarding.  Genitourinary:  No cva tenderness  Musculoskeletal: Normal range of motion. He exhibits no edema and no tenderness.  Distal pulses palp. No obvious cellulitis of foot.  Neurological: He is alert. No cranial nerve deficit.  Oriented to person and place. Motor intact bil. No pronator drift.  Skin: Skin is warm and dry. He is not diaphoretic.  Psychiatric: He has a normal mood and affect.    No results found for this or any previous visit (from the past 240 hour(s)).   Results for orders placed during the hospital encounter of 09/12/11 (from the past 48 hour(s))  CBC     Status: Abnormal   Collection Time   09/12/11  1:29 PM      Component Value Range  Comment   WBC 7.2  4.0 - 10.5 (K/uL)    RBC 4.16 (*) 4.22 - 5.81 (MIL/uL)    Hemoglobin 11.0 (*) 13.0 - 17.0 (g/dL)    HCT 47.8 (*) 29.5 - 52.0 (%)    MCV 79.6  78.0 - 100.0 (fL)    MCH 26.4  26.0 - 34.0 (pg)    MCHC 33.2  30.0 - 36.0 (g/dL)    RDW 62.1 (*) 30.8 - 15.5 (%)    Platelets 183  150 - 400 (K/uL)   DIFFERENTIAL     Status: Normal   Collection Time   09/12/11  1:29 PM      Component Value Range Comment   Neutrophils Relative 76  43 - 77 (%)    Neutro Abs 5.5  1.7 - 7.7 (K/uL)    Lymphocytes Relative 17  12 - 46 (%)    Lymphs Abs 1.2  0.7 - 4.0 (K/uL)    Monocytes Relative 6  3 - 12 (%)    Monocytes Absolute 0.5  0.1 - 1.0 (K/uL)    Eosinophils Relative 1  0 -  5 (%)    Eosinophils Absolute 0.1  0.0 - 0.7 (K/uL)    Basophils Relative 0  0 - 1 (%)    Basophils Absolute 0.0  0.0 - 0.1 (K/uL)   COMPREHENSIVE METABOLIC PANEL     Status: Abnormal   Collection Time   09/12/11  1:29 PM      Component Value Range Comment   Sodium 137  135 - 145 (mEq/L)    Potassium 4.5  3.5 - 5.1 (mEq/L)    Chloride 104  96 - 112 (mEq/L)    CO2 21  19 - 32 (mEq/L)    Glucose, Bld 96  70 - 99 (mg/dL)    BUN 48 (*) 6 - 23 (mg/dL)    Creatinine, Ser 9.60 (*) 0.50 - 1.35 (mg/dL)    Calcium 8.2 (*) 8.4 - 10.5 (mg/dL)    Total Protein 7.2  6.0 - 8.3 (g/dL)    Albumin 2.9 (*) 3.5 - 5.2 (g/dL)    AST 48 (*) 0 - 37 (U/L)    ALT 9  0 - 53 (U/L)    Alkaline Phosphatase 55  39 - 117 (U/L)    Total Bilirubin 0.2 (*) 0.3 - 1.2 (mg/dL)    GFR calc non Af Amer 21 (*) >90 (mL/min)    GFR calc Af Amer 24 (*) >90 (mL/min)   TROPONIN I     Status: Normal   Collection Time   09/12/11  1:29 PM      Component Value Range Comment   Troponin I <0.30  <0.30 (ng/mL)   VALPROIC ACID LEVEL     Status: Normal   Collection Time   09/12/11  1:30 PM      Component Value Range Comment   Valproic Acid Lvl 52.8  50.0 - 100.0 (ug/mL)   URINALYSIS, ROUTINE W REFLEX MICROSCOPIC     Status: Abnormal   Collection Time    09/12/11  3:40 PM      Component Value Range Comment   Color, Urine YELLOW  YELLOW     APPearance CLOUDY (*) CLEAR     Specific Gravity, Urine 1.022  1.005 - 1.030     pH 6.0  5.0 - 8.0     Glucose, UA NEGATIVE  NEGATIVE (mg/dL)    Hgb urine dipstick LARGE (*) NEGATIVE     Bilirubin Urine NEGATIVE  NEGATIVE     Ketones, ur NEGATIVE  NEGATIVE (mg/dL)    Protein, ur NEGATIVE  NEGATIVE (mg/dL)    Urobilinogen, UA 0.2  0.0 - 1.0 (mg/dL)    Nitrite NEGATIVE  NEGATIVE     Leukocytes, UA NEGATIVE  NEGATIVE    URINE MICROSCOPIC-ADD ON     Status: Normal   Collection Time   09/12/11  3:40 PM      Component Value Range Comment   Urine-Other        Value: NO FORMED ELEMENTS SEEN ON URINE MICROSCOPIC EXAMINATION    Dg Chest 2 View  09/12/2011  *RADIOLOGY REPORT*  Clinical Data: Weakness, shortness of breath, smoker  CHEST - 2 VIEW  Comparison: 09/04/2008  Findings: Normal heart size and pulmonary vascularity. Calcified tortuous aorta. Lungs clear. No pleural effusion or pneumothorax. Questionable nodular density lower left chest. Bones unremarkable.  IMPRESSION: Questionable nodular density lower left chest, could potentially represent a nipple shadow; recommend upright PA chest radiograph with nipple markers to assess.  Original Report Authenticated By: Lollie Marrow, M.D.   Dg Ankle Complete Right  09/09/2011  *  RADIOLOGY REPORT*  Clinical Data: Pain  RIGHT ANKLE - COMPLETE 3+ VIEW  Comparison: None.  Findings: There is no evidence of bone, joint, or soft tissue abnormality.  IMPRESSION: Negative right ankle.  Original Report Authenticated By: Brandon Melnick, M.D.   Ct Head Wo Contrast  09/12/2011  *RADIOLOGY REPORT*  Clinical Data: Increasing weakness.  Altered level of consciousness. Dementia.  High blood pressure.  Prostate cancer.  CT HEAD WITHOUT CONTRAST  Technique:  Contiguous axial images were obtained from the base of the skull through the vertex without contrast.  Comparison: 01/03/2011 head  CT.  05/10/2011 neck CT.  Findings: No intracranial hemorrhage.  Remote infarct right caudate head/superior aspect of the right basal ganglia.  Prominent small vessel disease type changes.  No CT evidence of large acute infarct.  Global atrophy without hydrocephalus.  Vascular calcifications.  No intracranial mass lesion detected on this unenhanced exam.  No sclerotic focus of the calvarium.  Visualized paranasal sinuses and mastoid air cells clear.  IMPRESSION: No acute abnormality.  Please see above.  Original Report Authenticated By: Fuller Canada, M.D.   Dg Foot Complete Right  09/09/2011  *RADIOLOGY REPORT*  Clinical Data: Right-sided foot pain radiating through metatarsals. Ankle swelling.No injury.  RIGHT FOOT COMPLETE - 3+ VIEW  Comparison: None.  Findings: Mild hallux valgus with moderate first MTP joint osteoarthritis.  Diffuse osteopenia is present.  Dystrophic proximal phalanx of the small toe incidentally noted, of likely no clinical significance.  Mild midfoot osteoarthritis is present. The no fracture. Dystrophic great toe toenail, likely an onychomycosis.  IMPRESSION: Osteopenia and first MTP joint osteoarthritis.  No acute osseous abnormality.  Original Report Authenticated By: Andreas Newport, M.D.    Impression:  Active Problems:  SLEEP APNEA  PROSTATE CANCER, HX OF  History of CVA (cerebrovascular accident)  Vascular dementia  AKI (acute kidney injury)  Falls  possible gout     Plan: #1 falls/weakness this is multifactorial, differential diagnosis includes infection, dehydration, gout, polymyalgia rheumatica? We'll check an ESR Check CPK Uric acid level PT OT evaluation  #2 acute kidney injury in the setting of use of antihypertensive medications including ACE inhibitors, Bactrim, dehydration versus prerenal, versus post obstructive Patient has a Foley catheter in place Start IV fluids  #3 peripheral vascular disease will check ABI in the morning  #4 dementia/CVA  we'll obtain a PT OT consultation I doubt that the patient has had a CVA again. He has no focal neurologic deficits.  #5 right foot pain combination of flare of gout versus cellulitis versus claudication secondary to peripheral vascular disease, will start IV ciprofloxacin, as well as by mouth prednisone  He is a full code      [facility] 09/12/2011, 6:32 PM

## 2011-09-12 NOTE — ED Provider Notes (Signed)
History     CSN: [id number]  Arrival date & time 09/12/11  1308   First MD Initiated Contact with Patient 09/12/11 1319      Chief Complaint  Patient presents with  . Weakness    (Consider location/radiation/quality/duration/timing/severity/associated sxs/prior treatment) Patient is a 78 y.o. male presenting with weakness. The history is provided by the patient and a relative.  Weakness Primary symptoms do not include headaches, fever or vomiting.  Additional symptoms include weakness.  pt from pcp office w c/o generalized weakness for past few days. Has fallen x 2 at home, pt cannot say/recall what caused him to fall and family unsure. At baseline uses walker, mild dementia. Family notes seems slower to respond than normal. No change in speech. Pt denies headache. Denies change in speech or vision. No numbness/weakness. Pt denies gu c/o. Denies change in meds, states is compliant w meds. Pt denies pain or injury w falls. Denies fainting, but unsure. Pt was in ed 5/7 with right foot pain, had hx gout, put was felt to have cellulitis on that visit, placed on keflex and bactrim. Currently denies foot pain, no redness or increased swelling. Denies any current or recent chest discomfort.   Past Medical History  Diagnosis Date  . Hypertension   . CVA (cerebral vascular accident)   . Dementia   . Prostate cancer     Past Surgical History  Procedure Date  . Neck surgery   . Back surgery     Family History  Problem Relation Age of Onset  . Hypotension Mother   . Hypotension Father     History  Substance Use Topics  . Smoking status: Current Everyday Smoker -- 15 years    Types: Cigars  . Smokeless tobacco: Not on file  . Alcohol Use: 0.6 oz/week    1 Glasses of wine per week      Review of Systems  Constitutional: Negative for fever and chills.  HENT: Negative for neck pain.   Eyes: Negative for pain and visual disturbance.  Respiratory: Negative for cough and  shortness of breath.   Cardiovascular: Negative for chest pain.  Gastrointestinal: Negative for vomiting, abdominal pain and diarrhea.  Genitourinary: Negative for dysuria and flank pain.  Musculoskeletal: Negative for back pain.  Skin: Negative for rash.  Neurological: Positive for weakness. Negative for numbness and headaches.  Hematological: Does not bruise/bleed easily.  Psychiatric/Behavioral: Negative for agitation.    Allergies  Review of patient's allergies indicates no known allergies.  Home Medications   Current Outpatient Rx  Name Route Sig Dispense Refill  . ALLOPURINOL 100 MG PO TABS Oral Take 100 mg by mouth daily.    Marland Kitchen AMLODIPINE BESYLATE 5 MG PO TABS Oral Take 5 mg by mouth daily.    . ASPIRIN EC 81 MG PO TBEC Oral Take 81 mg by mouth daily.    Marland Kitchen DIVALPROEX SODIUM 250 MG PO TBEC Oral Take 250 mg by mouth 2 (two) times daily. Take one tablet in the am and 2 tablets in the pm    . DONEPEZIL HCL 5 MG PO TABS Oral Take 5 mg by mouth daily.    Marland Kitchen GABAPENTIN 800 MG PO TABS Oral Take 800 mg by mouth 3 (three) times daily.      Marland Kitchen HYDROCHLOROTHIAZIDE 25 MG PO TABS Oral Take 12.5 mg by mouth daily. Take 1/2 tablet by mouth once daily    . LISINOPRIL 10 MG PO TABS Oral Take 10 mg by mouth daily.    Marland Kitchen  MULTIVITAMINS PO TABS Oral Take 1 tablet by mouth daily.    . OXYBUTYNIN CHLORIDE ER 10 MG PO TB24 Oral Take 10 mg by mouth 3 (three) times daily.    Marland Kitchen PRAVASTATIN SODIUM 40 MG PO TABS  TAKE ONE TABLET BY MOUTH EVERY DAY NEEDS  OFFICE  VISIT  FOR  MORE  REFILLS 30 tablet 3  . TRAMADOL HCL 50 MG PO TABS Oral Take 1 tablet (50 mg total) by mouth every 6 (six) hours as needed for pain. 30 tablet 0    BP 107/52  Pulse 52  Temp(Src) 98.2 F (36.8 C) (Oral)  Resp 16  SpO2 96%  Physical Exam  Nursing note and vitals reviewed. Constitutional: He appears well-developed and well-nourished.  HENT:  Head: Atraumatic.  Nose: Nose normal.  Mouth/Throat: Oropharynx is clear and moist.    Eyes: Conjunctivae are normal. Pupils are equal, round, and reactive to light. No scleral icterus.  Neck: Normal range of motion. Neck supple. No tracheal deviation present.  Cardiovascular: Normal rate, regular rhythm, normal heart sounds and intact distal pulses.   Pulmonary/Chest: Effort normal and breath sounds normal. No accessory muscle usage. No respiratory distress.  Abdominal: Soft. Bowel sounds are normal. He exhibits no distension and no mass. There is no tenderness. There is no rebound and no guarding.  Genitourinary:       No cva tenderness  Musculoskeletal: Normal range of motion. He exhibits no edema and no tenderness.       Distal pulses palp. No obvious cellulitis of foot.   Neurological: He is alert. No cranial nerve deficit.       Oriented to person and place. Motor intact bil. No pronator drift.   Skin: Skin is warm and dry. He is not diaphoretic.  Psychiatric: He has a normal mood and affect.    ED Course  Procedures (including critical care time)   Labs Reviewed  CBC  DIFFERENTIAL  COMPREHENSIVE METABOLIC PANEL  URINALYSIS, ROUTINE W REFLEX MICROSCOPIC  TROPONIN I  VALPROIC ACID LEVEL   Results for orders placed during the hospital encounter of 09/12/11  CBC      Component Value Range   WBC 7.2  4.0 - 10.5 (K/uL)   RBC 4.16 (*) 4.22 - 5.81 (MIL/uL)   Hemoglobin 11.0 (*) 13.0 - 17.0 (g/dL)   HCT 45.4 (*) 09.8 - 52.0 (%)   MCV 79.6  78.0 - 100.0 (fL)   MCH 26.4  26.0 - 34.0 (pg)   MCHC 33.2  30.0 - 36.0 (g/dL)   RDW 11.9 (*) 14.7 - 15.5 (%)   Platelets 183  150 - 400 (K/uL)  DIFFERENTIAL      Component Value Range   Neutrophils Relative 76  43 - 77 (%)   Neutro Abs 5.5  1.7 - 7.7 (K/uL)   Lymphocytes Relative 17  12 - 46 (%)   Lymphs Abs 1.2  0.7 - 4.0 (K/uL)   Monocytes Relative 6  3 - 12 (%)   Monocytes Absolute 0.5  0.1 - 1.0 (K/uL)   Eosinophils Relative 1  0 - 5 (%)   Eosinophils Absolute 0.1  0.0 - 0.7 (K/uL)   Basophils Relative 0  0 - 1  (%)   Basophils Absolute 0.0  0.0 - 0.1 (K/uL)  COMPREHENSIVE METABOLIC PANEL      Component Value Range   Sodium 137  135 - 145 (mEq/L)   Potassium 4.5  3.5 - 5.1 (mEq/L)   Chloride 104  96 -  112 (mEq/L)   CO2 21  19 - 32 (mEq/L)   Glucose, Bld 96  70 - 99 (mg/dL)   BUN 48 (*) 6 - 23 (mg/dL)   Creatinine, Ser 8.29 (*) 0.50 - 1.35 (mg/dL)   Calcium 8.2 (*) 8.4 - 10.5 (mg/dL)   Total Protein 7.2  6.0 - 8.3 (g/dL)   Albumin 2.9 (*) 3.5 - 5.2 (g/dL)   AST 48 (*) 0 - 37 (U/L)   ALT 9  0 - 53 (U/L)   Alkaline Phosphatase 55  39 - 117 (U/L)   Total Bilirubin 0.2 (*) 0.3 - 1.2 (mg/dL)   GFR calc non Af Amer 21 (*) >90 (mL/min)   GFR calc Af Amer 24 (*) >90 (mL/min)  TROPONIN I      Component Value Range   Troponin I <0.30  <0.30 (ng/mL)   Dg Chest 2 View  09/12/2011  *RADIOLOGY REPORT*  Clinical Data: Weakness, shortness of breath, smoker  CHEST - 2 VIEW  Comparison: 09/04/2008  Findings: Normal heart size and pulmonary vascularity. Calcified tortuous aorta. Lungs clear. No pleural effusion or pneumothorax. Questionable nodular density lower left chest. Bones unremarkable.  IMPRESSION: Questionable nodular density lower left chest, could potentially represent a nipple shadow; recommend upright PA chest radiograph with nipple markers to assess.  Original Report Authenticated By: Lollie Marrow, M.D.   Dg Ankle Complete Right  09/09/2011  *RADIOLOGY REPORT*  Clinical Data: Pain  RIGHT ANKLE - COMPLETE 3+ VIEW  Comparison: None.  Findings: There is no evidence of bone, joint, or soft tissue abnormality.  IMPRESSION: Negative right ankle.  Original Report Authenticated By: Brandon Melnick, M.D.   Ct Head Wo Contrast  09/12/2011  *RADIOLOGY REPORT*  Clinical Data: Increasing weakness.  Altered level of consciousness. Dementia.  High blood pressure.  Prostate cancer.  CT HEAD WITHOUT CONTRAST  Technique:  Contiguous axial images were obtained from the base of the skull through the vertex without  contrast.  Comparison: 01/03/2011 head CT.  05/10/2011 neck CT.  Findings: No intracranial hemorrhage.  Remote infarct right caudate head/superior aspect of the right basal ganglia.  Prominent small vessel disease type changes.  No CT evidence of large acute infarct.  Global atrophy without hydrocephalus.  Vascular calcifications.  No intracranial mass lesion detected on this unenhanced exam.  No sclerotic focus of the calvarium.  Visualized paranasal sinuses and mastoid air cells clear.  IMPRESSION: No acute abnormality.  Please see above.  Original Report Authenticated By: Fuller Canada, M.D.   Dg Foot Complete Right  09/09/2011  *RADIOLOGY REPORT*  Clinical Data: Right-sided foot pain radiating through metatarsals. Ankle swelling.No injury.  RIGHT FOOT COMPLETE - 3+ VIEW  Comparison: None.  Findings: Mild hallux valgus with moderate first MTP joint osteoarthritis.  Diffuse osteopenia is present.  Dystrophic proximal phalanx of the small toe incidentally noted, of likely no clinical significance.  Mild midfoot osteoarthritis is present. The no fracture. Dystrophic great toe toenail, likely an onychomycosis.  IMPRESSION: Osteopenia and first MTP joint osteoarthritis.  No acute osseous abnormality.  Original Report Authenticated By: Andreas Newport, M.D.       MDM  Iv ns. Labs. Ct.   Creatinine elev compared to prior, family notes decreased po intake.  Iv ns bolus.     Date: 09/12/2011  Rate: 61  Rhythm: normal sinus rhythm  QRS Axis: normal  Intervals: normal  ST/T Wave abnormalities: nonspecific ST changes  Conduction Disutrbances:none  Narrative Interpretation:   Old EKG Reviewed: unchanged  Pt/family report pt has been urinating/wetting his diapers normally the past few days.  i placed foley catheter using sterile technique, 800 cc cloudy urine out. Will cx.   Given urine continuing to drain, ?etiol renal insuff, dehydration vs obstructive uropathy.  Given falls, weakness,  elevated creatinine/arf -  Will admit, triad paged.   Suzi Roots, MD 09/12/11 [phone]

## 2011-09-12 NOTE — ED Notes (Signed)
Per [facility] pt in from [facility] due to increased weakness, foot pain, cellulitis and changes in mental status.

## 2011-09-12 NOTE — ED Notes (Signed)
Returned from XR 

## 2011-09-12 NOTE — ED Notes (Signed)
Patient transported to CT 

## 2011-09-12 NOTE — ED Notes (Signed)
Attempted to do the in and out cath. Could not pass the catheter to the bladder.

## 2011-09-13 DIAGNOSIS — M109 Gout, unspecified: Secondary | ICD-10-CM

## 2011-09-13 DIAGNOSIS — N179 Acute kidney failure, unspecified: Secondary | ICD-10-CM

## 2011-09-13 DIAGNOSIS — R4182 Altered mental status, unspecified: Secondary | ICD-10-CM

## 2011-09-13 LAB — CARDIAC PANEL(CRET KIN+CKTOT+MB+TROPI)
CK, MB: 9.1 ng/mL (ref 0.3–4.0)
Relative Index: 0.4 (ref 0.0–2.5)
Troponin I: <0.3 ng/mL (ref <0.30)
Troponin I: <0.3 ng/mL (ref <0.30)

## 2011-09-13 LAB — CBC
Hemoglobin: 9.9 g/dL — ABNORMAL LOW (ref 13.0–17.0)
MCH: 26.4 pg (ref 26.0–34.0)
MCHC: 33.1 g/dL (ref 30.0–36.0)
MCV: 79.7 fL (ref 78.0–100.0)

## 2011-09-13 LAB — BASIC METABOLIC PANEL
BUN: 37 mg/dL — ABNORMAL HIGH (ref 6–23)
CO2: 22 mEq/L (ref 19–32)
GFR calc non Af Amer: 32 mL/min — ABNORMAL LOW (ref >90)
Glucose, Bld: 120 mg/dL — ABNORMAL HIGH (ref 70–99)
Potassium: 5.4 mEq/L — ABNORMAL HIGH (ref 3.5–5.1)

## 2011-09-13 MED ORDER — ENOXAPARIN SODIUM 40 MG/0.4ML ~~LOC~~ SOLN
40.0000 mg | SUBCUTANEOUS | Status: DC
  Administered 2011-09-13 – 2011-09-16 (×4): 40 mg via SUBCUTANEOUS
  Filled 2011-09-13 (×5): qty 0.4

## 2011-09-13 MED ORDER — COLCHICINE 0.6 MG PO TABS
0.6000 mg | ORAL_TABLET | Freq: Every day | ORAL | Status: DC
  Administered 2011-09-13: 0.6 mg via ORAL
  Filled 2011-09-13 (×2): qty 1

## 2011-09-13 MED ORDER — ALBUTEROL SULFATE (5 MG/ML) 0.5% IN NEBU: 2.5000 mg | INHALATION_SOLUTION | RESPIRATORY_TRACT | Status: DC | PRN

## 2011-09-13 MED ORDER — IPRATROPIUM BROMIDE 0.02 % IN SOLN: 0.5000 mg | RESPIRATORY_TRACT | Status: DC | PRN

## 2011-09-13 NOTE — Progress Notes (Signed)
Subjective: Significantly better than yesterday  Objective: Vital signs in last 24 hours: Filed Vitals:   09/12/11 1651 09/12/11 1805 09/12/11 2108 09/13/11 0700  BP: 156/63 119/78 113/57 146/66  Pulse: 70 76 88 74  Temp:  98.1 F (36.7 C) 97.9 F (36.6 C) 98.2 F (36.8 C)  TempSrc:  Oral Oral Axillary  Resp: 18 17 18 18   Height:  6\' 4"  (1.93 m)    Weight:  91 kg (200 lb 9.9 oz)    SpO2: 98% 95% 97% 97%    Intake/Output Summary (Last 24 hours) at 09/13/11 1159 Last data filed at 09/13/11 0900  Gross per 24 hour  Intake 1582.5 ml  Output   1950 ml  Net -367.5 ml    Weight change:  HENT:  Head: Atraumatic.  Nose: Nose normal.  Mouth/Throat: Oropharynx is clear and moist.  Eyes: Conjunctivae are normal. Pupils are equal, round, and reactive to light. No scleral icterus.  Neck: Normal range of motion. Neck supple. No tracheal deviation present.  Cardiovascular: Normal rate, regular rhythm, normal heart sounds and intact distal pulses.  Pulmonary/Chest: Effort normal and breath sounds normal. No accessory muscle usage. No respiratory distress.  Abdominal: Soft. Bowel sounds are normal. He exhibits no distension and no mass. There is no tenderness. There is no rebound and no guarding.  Genitourinary:  No cva tenderness  Musculoskeletal: Normal range of motion. He exhibits no edema and no tenderness.  Distal pulses palp. No obvious cellulitis of foot.  Neurological: He is alert. No cranial nerve deficit.  Oriented to person and place. Motor intact bil. No pronator drift.  Skin: Skin is warm and dry. He is not diaphoretic.  Psychiatric: He has a normal mood and affect.       Lab Results: Results for orders placed during the hospital encounter of 09/12/11 (from the past 24 hour(s))  CBC     Status: Abnormal   Collection Time   09/12/11  1:29 PM      Component Value Range   WBC 7.2  4.0 - 10.5 (K/uL)   RBC 4.16 (*) 4.22 - 5.81 (MIL/uL)   Hemoglobin 11.0 (*) 13.0 - 17.0  (g/dL)   HCT 16.1 (*) 09.6 - 52.0 (%)   MCV 79.6  78.0 - 100.0 (fL)   MCH 26.4  26.0 - 34.0 (pg)   MCHC 33.2  30.0 - 36.0 (g/dL)   RDW 04.5 (*) 40.9 - 15.5 (%)   Platelets 183  150 - 400 (K/uL)  DIFFERENTIAL     Status: Normal   Collection Time   09/12/11  1:29 PM      Component Value Range   Neutrophils Relative 76  43 - 77 (%)   Neutro Abs 5.5  1.7 - 7.7 (K/uL)   Lymphocytes Relative 17  12 - 46 (%)   Lymphs Abs 1.2  0.7 - 4.0 (K/uL)   Monocytes Relative 6  3 - 12 (%)   Monocytes Absolute 0.5  0.1 - 1.0 (K/uL)   Eosinophils Relative 1  0 - 5 (%)   Eosinophils Absolute 0.1  0.0 - 0.7 (K/uL)   Basophils Relative 0  0 - 1 (%)   Basophils Absolute 0.0  0.0 - 0.1 (K/uL)  COMPREHENSIVE METABOLIC PANEL     Status: Abnormal   Collection Time   09/12/11  1:29 PM      Component Value Range   Sodium 137  135 - 145 (mEq/L)   Potassium 4.5  3.5 - 5.1 (mEq/L)  Chloride 104  96 - 112 (mEq/L)   CO2 21  19 - 32 (mEq/L)   Glucose, Bld 96  70 - 99 (mg/dL)   BUN 48 (*) 6 - 23 (mg/dL)   Creatinine, Ser 1.61 (*) 0.50 - 1.35 (mg/dL)   Calcium 8.2 (*) 8.4 - 10.5 (mg/dL)   Total Protein 7.2  6.0 - 8.3 (g/dL)   Albumin 2.9 (*) 3.5 - 5.2 (g/dL)   AST 48 (*) 0 - 37 (U/L)   ALT 9  0 - 53 (U/L)   Alkaline Phosphatase 55  39 - 117 (U/L)   Total Bilirubin 0.2 (*) 0.3 - 1.2 (mg/dL)   GFR calc non Af Amer 21 (*) >90 (mL/min)   GFR calc Af Amer 24 (*) >90 (mL/min)  TROPONIN I     Status: Normal   Collection Time   09/12/11  1:29 PM      Component Value Range   Troponin I <0.30  <0.30 (ng/mL)  VALPROIC ACID LEVEL     Status: Normal   Collection Time   09/12/11  1:30 PM      Component Value Range   Valproic Acid Lvl 52.8  50.0 - 100.0 (ug/mL)  URINALYSIS, ROUTINE W REFLEX MICROSCOPIC     Status: Abnormal   Collection Time   09/12/11  3:40 PM      Component Value Range   Color, Urine YELLOW  YELLOW    APPearance CLOUDY (*) CLEAR    Specific Gravity, Urine 1.022  1.005 - 1.030    pH 6.0  5.0 - 8.0      Glucose, UA NEGATIVE  NEGATIVE (mg/dL)   Hgb urine dipstick LARGE (*) NEGATIVE    Bilirubin Urine NEGATIVE  NEGATIVE    Ketones, ur NEGATIVE  NEGATIVE (mg/dL)   Protein, ur NEGATIVE  NEGATIVE (mg/dL)   Urobilinogen, UA 0.2  0.0 - 1.0 (mg/dL)   Nitrite NEGATIVE  NEGATIVE    Leukocytes, UA NEGATIVE  NEGATIVE   URINE MICROSCOPIC-ADD ON     Status: Normal   Collection Time   09/12/11  3:40 PM      Component Value Range   Urine-Other       Value: NO FORMED ELEMENTS SEEN ON URINE MICROSCOPIC EXAMINATION  CARDIAC PANEL(CRET KIN+CKTOT+MB+TROPI)     Status: Abnormal   Collection Time   09/12/11  7:15 PM      Component Value Range   Total CK 3208 (*) 7 - 232 (U/L)   CK, MB 12.7 (*) 0.3 - 4.0 (ng/mL)   Troponin I <0.30  <0.30 (ng/mL)   Relative Index 0.4  0.0 - 2.5   VALPROIC ACID LEVEL     Status: Abnormal   Collection Time   09/12/11  7:15 PM      Component Value Range   Valproic Acid Lvl 48.1 (*) 50.0 - 100.0 (ug/mL)  CBC     Status: Abnormal   Collection Time   09/12/11  7:15 PM      Component Value Range   WBC 7.1  4.0 - 10.5 (K/uL)   RBC 4.12 (*) 4.22 - 5.81 (MIL/uL)   Hemoglobin 10.9 (*) 13.0 - 17.0 (g/dL)   HCT 09.6 (*) 04.5 - 52.0 (%)   MCV 79.4  78.0 - 100.0 (fL)   MCH 26.5  26.0 - 34.0 (pg)   MCHC 33.3  30.0 - 36.0 (g/dL)   RDW 40.9 (*) 81.1 - 15.5 (%)   Platelets 188  150 - 400 (K/uL)  MAGNESIUM  Status: Normal   Collection Time   09/12/11  7:15 PM      Component Value Range   Magnesium 2.3  1.5 - 2.5 (mg/dL)  TSH     Status: Normal   Collection Time   09/12/11  7:15 PM      Component Value Range   TSH 2.013  0.350 - 4.500 (uIU/mL)  URIC ACID     Status: Abnormal   Collection Time   09/12/11  7:15 PM      Component Value Range   Uric Acid, Serum 12.0 (*) 4.0 - 7.8 (mg/dL)  SEDIMENTATION RATE     Status: Abnormal   Collection Time   09/12/11  7:15 PM      Component Value Range   Sed Rate 58 (*) 0 - 16 (mm/hr)  CARDIAC PANEL(CRET KIN+CKTOT+MB+TROPI)      Status: Abnormal   Collection Time   09/12/11 11:32 PM      Component Value Range   Total CK 2868 (*) 7 - 232 (U/L)   CK, MB 11.1 (*) 0.3 - 4.0 (ng/mL)   Troponin I <0.30  <0.30 (ng/mL)   Relative Index 0.4  0.0 - 2.5   CARDIAC PANEL(CRET KIN+CKTOT+MB+TROPI)     Status: Abnormal   Collection Time   09/13/11  5:26 AM      Component Value Range   Total CK 2606 (*) 7 - 232 (U/L)   CK, MB 9.1 (*) 0.3 - 4.0 (ng/mL)   Troponin I <0.30  <0.30 (ng/mL)   Relative Index 0.3  0.0 - 2.5   BASIC METABOLIC PANEL     Status: Abnormal   Collection Time   09/13/11  5:26 AM      Component Value Range   Sodium 129 (*) 135 - 145 (mEq/L)   Potassium 5.4 (*) 3.5 - 5.1 (mEq/L)   Chloride 98  96 - 112 (mEq/L)   CO2 22  19 - 32 (mEq/L)   Glucose, Bld 120 (*) 70 - 99 (mg/dL)   BUN 37 (*) 6 - 23 (mg/dL)   Creatinine, Ser 1.61 (*) 0.50 - 1.35 (mg/dL)   Calcium 7.7 (*) 8.4 - 10.5 (mg/dL)   GFR calc non Af Amer 32 (*) >90 (mL/min)   GFR calc Af Amer 38 (*) >90 (mL/min)  CBC     Status: Abnormal   Collection Time   09/13/11  5:26 AM      Component Value Range   WBC 6.7  4.0 - 10.5 (K/uL)   RBC 3.75 (*) 4.22 - 5.81 (MIL/uL)   Hemoglobin 9.9 (*) 13.0 - 17.0 (g/dL)   HCT 09.6 (*) 04.5 - 52.0 (%)   MCV 79.7  78.0 - 100.0 (fL)   MCH 26.4  26.0 - 34.0 (pg)   MCHC 33.1  30.0 - 36.0 (g/dL)   RDW 40.9 (*) 81.1 - 15.5 (%)   Platelets 159  150 - 400 (K/uL)    Micro: No results found for this or any previous visit (from the past 240 hour(s)).  Studies/Results: Dg Chest 2 View  09/12/2011  *RADIOLOGY REPORT*  Clinical Data: Weakness, shortness of breath, smoker  CHEST - 2 VIEW  Comparison: 09/04/2008  Findings: Normal heart size and pulmonary vascularity. Calcified tortuous aorta. Lungs clear. No pleural effusion or pneumothorax. Questionable nodular density lower left chest. Bones unremarkable.  IMPRESSION: Questionable nodular density lower left chest, could potentially represent a nipple shadow; recommend  upright PA chest radiograph with nipple markers to assess.  Original Report  Authenticated By: Lollie Marrow, M.D.   Ct Head Wo Contrast  09/12/2011  *RADIOLOGY REPORT*  Clinical Data: Increasing weakness.  Altered level of consciousness. Dementia.  High blood pressure.  Prostate cancer.  CT HEAD WITHOUT CONTRAST  Technique:  Contiguous axial images were obtained from the base of the skull through the vertex without contrast.  Comparison: 01/03/2011 head CT.  05/10/2011 neck CT.  Findings: No intracranial hemorrhage.  Remote infarct right caudate head/superior aspect of the right basal ganglia.  Prominent small vessel disease type changes.  No CT evidence of large acute infarct.  Global atrophy without hydrocephalus.  Vascular calcifications.  No intracranial mass lesion detected on this unenhanced exam.  No sclerotic focus of the calvarium.  Visualized paranasal sinuses and mastoid air cells clear.  IMPRESSION: No acute abnormality.  Please see above.  Original Report Authenticated By: Fuller Canada, M.D.    Medications:  Scheduled Meds:   . ciprofloxacin  400 mg Intravenous Q24H  . enoxaparin  30 mg Subcutaneous Q24H  . oxybutynin  10 mg Oral TID  . predniSONE  20 mg Oral BID  . simvastatin  5 mg Oral q1800  . sodium chloride  1,000 mL Intravenous Once  . sodium chloride  3 mL Intravenous Q12H  . DISCONTD: ipratropium  0.5 mg Nebulization Q6H   Continuous Infusions:   . sodium chloride 100 mL/hr at 09/13/11 1031   PRN Meds:.acetaminophen, acetaminophen, albuterol, ipratropium, ondansetron (ZOFRAN) IV, ondansetron, traMADol, DISCONTD: albuterol   Assessment: Active Problems:  SLEEP APNEA  PROSTATE CANCER, HX OF  History of CVA (cerebrovascular accident)  Vascular dementia  AKI (acute kidney injury)  Falls   Plan:  #1 falls/weakness this is multifactorial, differential diagnosis includes infection, dehydration, gout, polymyalgia rheumatica? Rhabdomyolysis ESR is elevated Check  CPK  Uric acid elevated  PT OT evaluation   #2 acute kidney injury in the setting of use of antihypertensive medications including ACE inhibitors, Bactrim, dehydration versus prerenal, versus post obstructive  Patient has a Foley catheter in place  Start IV fluids  Improving  #3 peripheral vascular disease ABI shows moderate to severe disease on the right, discussed with family. At this time they're not interested in having a vascular surgical consult   #4 dementia/CVA we'll obtain a PT OT consultation I doubt that the patient has had a CVA again. He has no focal neurologic deficits.   #5 right foot pain most likely secondary to acute gout versus claudication secondary to peripheral vascular disease, discontinue Cipro, continue prednisone, start colchicine He is a full code      LOS: 1 day   [facility] 09/13/2011, 11:59 AM

## 2011-09-13 NOTE — Progress Notes (Signed)
VASCULAR LAB PRELIMINARY  ARTERIAL  ABI completed: Right ABI indicates moderate to severe reduction in arterial flow.  Left ABI indicates moderate reduction in arterial flow. Abnormal waveforms, bilaterally.    RIGHT    LEFT    PRESSURE WAVEFORM  PRESSURE WAVEFORM  BRACHIAL 121 T BRACHIAL 113 T  DP   DP    AT 64 DM AT 83 DM  PT 67 DM PT 84 DM  PER   PER    GREAT TOE  NA GREAT TOE  NA    RIGHT LEFT  ABI 0.55 0.69     Sherren Kerns Renee, 09/13/2011, 10:17 AM

## 2011-09-13 NOTE — Progress Notes (Signed)
Spoke with patient regarding cpap.  Pt stated he does not wear cpap at home and does not have a machine at home.  RN aware and present in room.

## 2011-09-13 NOTE — Progress Notes (Signed)
CRITICAL VALUE ALERT  Critical value received:  CK MB 12.7  Date of notification: 09-12-2011  Time of notification:  2030  Critical value read back:yes  Nurse who received alert:  C. Gerilyn Pilgrim  MD notified (1st page):  M. Lynch  Time of first page:  2045  MD notified (2nd page):  Time of second page:  Responding MD:  M. Lynch  Time MD responded:  2050

## 2011-09-14 ENCOUNTER — Inpatient Hospital Stay (HOSPITAL_COMMUNITY): Payer: Medicare Other

## 2011-09-14 DIAGNOSIS — R4182 Altered mental status, unspecified: Secondary | ICD-10-CM

## 2011-09-14 DIAGNOSIS — N179 Acute kidney failure, unspecified: Secondary | ICD-10-CM

## 2011-09-14 DIAGNOSIS — M109 Gout, unspecified: Secondary | ICD-10-CM

## 2011-09-14 LAB — BASIC METABOLIC PANEL
CO2: 19 mEq/L (ref 19–32)
Chloride: 100 mEq/L (ref 96–112)
GFR calc non Af Amer: 52 mL/min — ABNORMAL LOW (ref >90)
Glucose, Bld: 138 mg/dL — ABNORMAL HIGH (ref 70–99)
Potassium: 4.7 mEq/L (ref 3.5–5.1)
Sodium: 128 mEq/L — ABNORMAL LOW (ref 135–145)

## 2011-09-14 LAB — URINE CULTURE: Culture: NO GROWTH

## 2011-09-14 MED ORDER — MOXIFLOXACIN HCL IN NACL 400 MG/250ML IV SOLN
400.0000 mg | INTRAVENOUS | Status: DC
  Administered 2011-09-14 – 2011-09-16 (×3): 400 mg via INTRAVENOUS
  Filled 2011-09-14 (×4): qty 250

## 2011-09-14 MED ORDER — DEMECLOCYCLINE HCL 150 MG PO TABS
300.0000 mg | ORAL_TABLET | Freq: Two times a day (BID) | ORAL | Status: DC
  Administered 2011-09-14 – 2011-09-17 (×7): 300 mg via ORAL
  Filled 2011-09-14 (×8): qty 2

## 2011-09-14 MED ORDER — COLCHICINE 0.6 MG PO TABS
0.6000 mg | ORAL_TABLET | Freq: Two times a day (BID) | ORAL | Status: DC
  Administered 2011-09-14 – 2011-09-17 (×6): 0.6 mg via ORAL
  Filled 2011-09-14 (×7): qty 1

## 2011-09-14 MED ORDER — OLANZAPINE 2.5 MG PO TABS
2.5000 mg | ORAL_TABLET | Freq: Every day | ORAL | Status: DC
  Administered 2011-09-14 – 2011-09-16 (×3): 2.5 mg via ORAL
  Filled 2011-09-14 (×4): qty 1

## 2011-09-14 MED ORDER — TRAZODONE 25 MG HALF TABLET
25.0000 mg | ORAL_TABLET | Freq: Every day | ORAL | Status: DC
  Administered 2011-09-14 – 2011-09-16 (×3): 25 mg via ORAL
  Filled 2011-09-14 (×4): qty 1

## 2011-09-14 NOTE — Evaluation (Signed)
Physical Therapy Evaluation Patient Details Name: Manuel King MRN: [id number] DOB: May 21, 1929 Today's Date: 09/14/2011 Time: 8657-8469 PT Time Calculation (min): 32 min  PT Assessment / Plan / Recommendation Clinical Impression  Pt presents with diagnosis of falls, weakness. Pt was living alone PTA. Family very supportive. Long discussion with family memebers about d/c options. Explained to family that pt will require 24 hour supervison with +2 assist for safety with mobility. Pt is not safe to d/c home alone. Recommended ST rehab at SNF however family does not want this and neither does pt (per family).  Family is undecided at time of eval and will benefit from CM and SW consult. If pt discharges home he will likely need HHPT, home health aide, hospital bed, [facility] and ambulance transport home.      PT Assessment  Patient needs continued PT services    Follow Up Recommendations  Skilled nursing facility;Supervision/Assistance - 24 hour (HHPT if family decides to take pt home)    Barriers to Discharge        lEquipment Recommendations  Hospital bed;3 in 1 bedside comode (Home Health Aide);ambulance transport home likely    Recommendations for Other Services OT consult   Frequency Min 3X/week    Precautions / Restrictions Precautions Precautions: Fall Restrictions Weight Bearing Restrictions: No   Pertinent Vitals/Pain       Mobility  Bed Mobility Bed Mobility: Supine to Sit Supine to Sit: HOB elevated;2: Max assist Details for Bed Mobility Assistance: Assist for trunk to upright and bil LEs off bed. Increased time. Multimodal cues for safety, technique, hand placement Transfers Transfers: Sit to Stand;Stand to Sit Sit to Stand: From elevated surface;With upper extremity assist;From bed;1: +2 Total assist Sit to Stand: Patient Percentage: 70% Stand to Sit: With upper extremity assist;To chair/3-in-1 Details for Transfer Assistance: Multimodal cues for safety, technique, hand  placement. Assist to rise, stabilize, control descent. Sat EOB for 2-3 minutes so pt could stabilize and relax. Ambulation/Gait Ambulation/Gait Assistance: 1: +2 Total assist Ambulation/Gait: Patient Percentage: 70% Ambulation Distance (Feet): 20 Feet Assistive device: Rolling walker Ambulation/Gait Assistance Details: Assist to stabilize throughout ambulation and to navigate with RW. Very unsteady.  Gait Pattern: Step-through pattern;Decreased step length - left;Decreased step length - right;Decreased stride length    Exercises     PT Diagnosis: Difficulty walking;Abnormality of gait;Generalized weakness;Altered mental status  PT Problem List: Decreased strength;Decreased activity tolerance;Decreased balance;Decreased cognition;Decreased knowledge of use of DME;Decreased safety awareness;Decreased knowledge of precautions PT Treatment Interventions: DME instruction;Gait training;Functional mobility training;Therapeutic activities;Therapeutic exercise;Balance training;Patient/family education   PT Goals Acute Rehab PT Goals PT Goal Formulation: With family Time For Goal Achievement: 09/28/11 Potential to Achieve Goals: Fair Pt will go Supine/Side to Sit: with min assist PT Goal: Supine/Side to Sit - Progress: Goal set today Pt will go Sit to Supine/Side: with min assist PT Goal: Sit to Supine/Side - Progress: Goal set today Pt will go Sit to Stand: with min assist PT Goal: Sit to Stand - Progress: Goal set today Pt will go Stand to Sit: with min assist PT Goal: Stand to Sit - Progress: Goal set today Pt will Transfer Bed to Chair/Chair to Bed: with min assist PT Transfer Goal: Bed to Chair/Chair to Bed - Progress: Goal set today Pt will Ambulate: 51 - 150 feet;with min assist;with least restrictive assistive device PT Goal: Ambulate - Progress: Goal set today  Visit Information  Last PT Received On: 09/14/11 Assistance Needed: +2    Subjective Data  Subjective: "Lying  here....nothing is hurting" Patient Stated Goal: Home. Family undecided about d/c plans at this time.    Prior Functioning  Home Living Lives With: Alone Available Help at Discharge: Family Type of Home: House Home Access: Level entry Home Layout: One level Firefighter: Standard Home Adaptive Equipment: Walker - rolling;Straight cane;Wheelchair - manual Additional Comments: will need hospital bed, BSC. Will also likely need ambulance transport home if family decides to take pt home. Prior Function Level of Independence: Independent Able to Take Stairs?: No Driving: No Comments: Living alone and modified independent up until 1 week ago (was ambulating with cane) Communication Communication: No difficulties    Cognition  Overall Cognitive Status: History of cognitive impairments - at baseline Area of Impairment: Attention;Following commands;Safety/judgement;Awareness of deficits;Problem solving Arousal/Alertness: Awake/alert Orientation Level: Disoriented to;Time;Situation Behavior During Session: [facility] for tasks performed Current Attention Level: Alternating Following Commands: Follows one step commands with increased time Safety/Judgement: Decreased safety judgement for tasks assessed;Decreased awareness of need for assistance Problem Solving: requires cues/increased time for problem-solving Cognition - Other Comments: can become easily agitated, especially during some discussion with family    Extremity/Trunk Assessment Right Lower Extremity Assessment RLE ROM/Strength/Tone: Deficits RLE ROM/Strength/Tone Deficits: Strength: 3+ knee ext, 3- hip flex, 3 DF/PF Left Lower Extremity Assessment LLE ROM/Strength/Tone: Deficits LLE ROM/Strength/Tone Deficits: Strength 3 hip flex, 3+ knee ext, 2 DF/PF Trunk Assessment Trunk Assessment: Normal   Balance Balance Balance Assessed: Yes Static Sitting Balance Static Sitting - Balance Support: Bilateral upper extremity supported;Feet  supported Static Sitting - Level of Assistance: 4: Min assist Static Sitting - Comment/# of Minutes: Pt leaning posteriorly and demonstrating difficulty maintaining static stability.  Dynamic Sitting Balance Dynamic Sitting - Balance Support: Bilateral upper extremity supported;Feet supported Dynamic Sitting - Level of Assistance: 3: Mod assist Dynamic Sitting - Comments: Difficulty with anterior weight-shifting and with mobilizing LEs for MMT(falls posteriorly)  End of Session PT - End of Session Equipment Utilized During Treatment: Gait belt Activity Tolerance: Patient tolerated treatment well Patient left: in chair;with call bell/phone within reach;with family/visitor present   Rebeca Alert [facility] 09/14/2011, 3:30 PM [phone]

## 2011-09-14 NOTE — Progress Notes (Signed)
Subjective: Confused last night Pulled out his IV  alert now  Objective: Vital signs in last 24 hours: Filed Vitals:   09/13/11 0700 09/13/11 1427 09/13/11 2327 09/14/11 0525  BP: 146/66 126/50 141/57 156/64  Pulse: 74 54 54 51  Temp: 98.2 F (36.8 C) 98.1 F (36.7 C) 97.9 F (36.6 C) 98.1 F (36.7 C)  TempSrc: Axillary Oral Oral Oral  Resp: 18 19 18 18   Height:      Weight:      SpO2: 97% 98% 95% 96%    Intake/Output Summary (Last 24 hours) at 09/14/11 1318 Last data filed at 09/14/11 1610  Gross per 24 hour  Intake 2427.08 ml  Output   5280 ml  Net -2852.92 ml    Weight change:   HENT:  Head: Atraumatic.  Nose: Nose normal.  Mouth/Throat: Oropharynx is clear and moist.  Eyes: Conjunctivae are normal. Pupils are equal, round, and reactive to light. No scleral icterus.  Neck: Normal range of motion. Neck supple. No tracheal deviation present.  Cardiovascular: Normal rate, regular rhythm, normal heart sounds and intact distal pulses.  Pulmonary/Chest: Effort normal and breath sounds normal. No accessory muscle usage. No respiratory distress.  Abdominal: Soft. Bowel sounds are normal. He exhibits no distension and no mass. There is no tenderness. There is no rebound and no guarding.  Genitourinary:  No cva tenderness  Musculoskeletal: Normal range of motion. He exhibits no edema and no tenderness.  Distal pulses palp. No obvious cellulitis of foot.  Neurological: He is alert. No cranial nerve deficit.  Oriented to person and place. Motor intact bil. No pronator drift.  Skin: Skin is warm and dry. He is not diaphoretic.  Psychiatric: He has a normal mood and affect.       Lab Results: Results for orders placed during the hospital encounter of 09/12/11 (from the past 24 hour(s))  BASIC METABOLIC PANEL     Status: Abnormal   Collection Time   09/14/11  5:30 AM      Component Value Range   Sodium 128 (*) 135 - 145 (mEq/L)   Potassium 4.7  3.5 - 5.1 (mEq/L)   Chloride 100  96 - 112 (mEq/L)   CO2 19  19 - 32 (mEq/L)   Glucose, Bld 138 (*) 70 - 99 (mg/dL)   BUN 27 (*) 6 - 23 (mg/dL)   Creatinine, Ser 9.60  0.50 - 1.35 (mg/dL)   Calcium 7.9 (*) 8.4 - 10.5 (mg/dL)   GFR calc non Af Amer 52 (*) >90 (mL/min)   GFR calc Af Amer 60 (*) >90 (mL/min)     Micro: Recent Results (from the past 240 hour(s))  CULTURE, BLOOD (ROUTINE X 2)     Status: Normal (Preliminary result)   Collection Time   09/12/11  7:00 PM      Component Value Range Status Comment   Specimen Description BLOOD RIGHT HAND   Final    Special Requests BOTTLES DRAWN AEROBIC AND ANAEROBIC 10CC   Final    Culture  Setup Time [id number]   Final    Culture     Final    Value:        BLOOD CULTURE RECEIVED NO GROWTH TO DATE CULTURE WILL BE HELD FOR 5 DAYS BEFORE ISSUING A FINAL NEGATIVE REPORT   Report Status PENDING   Incomplete   CULTURE, BLOOD (ROUTINE X 2)     Status: Normal (Preliminary result)   Collection Time   09/12/11  7:15 PM  Component Value Range Status Comment   Specimen Description BLOOD RIGHT ARM   Final    Special Requests BOTTLES DRAWN AEROBIC ONLY   Final    Culture  Setup Time [id number]   Final    Culture     Final    Value:        BLOOD CULTURE RECEIVED NO GROWTH TO DATE CULTURE WILL BE HELD FOR 5 DAYS BEFORE ISSUING A FINAL NEGATIVE REPORT   Report Status PENDING   Incomplete     Studies/Results: Dg Chest 2 View  09/12/2011  *RADIOLOGY REPORT*  Clinical Data: Weakness, shortness of breath, smoker  CHEST - 2 VIEW  Comparison: 09/04/2008  Findings: Normal heart size and pulmonary vascularity. Calcified tortuous aorta. Lungs clear. No pleural effusion or pneumothorax. Questionable nodular density lower left chest. Bones unremarkable.  IMPRESSION: Questionable nodular density lower left chest, could potentially represent a nipple shadow; recommend upright PA chest radiograph with nipple markers to assess.  Original Report Authenticated By: Lollie Marrow, M.D.     Ct Head Wo Contrast  09/12/2011  *RADIOLOGY REPORT*  Clinical Data: Increasing weakness.  Altered level of consciousness. Dementia.  High blood pressure.  Prostate cancer.  CT HEAD WITHOUT CONTRAST  Technique:  Contiguous axial images were obtained from the base of the skull through the vertex without contrast.  Comparison: 01/03/2011 head CT.  05/10/2011 neck CT.  Findings: No intracranial hemorrhage.  Remote infarct right caudate head/superior aspect of the right basal ganglia.  Prominent small vessel disease type changes.  No CT evidence of large acute infarct.  Global atrophy without hydrocephalus.  Vascular calcifications.  No intracranial mass lesion detected on this unenhanced exam.  No sclerotic focus of the calvarium.  Visualized paranasal sinuses and mastoid air cells clear.  IMPRESSION: No acute abnormality.  Please see above.  Original Report Authenticated By: Fuller Canada, M.D.    Medications:  Scheduled Meds:   . colchicine  0.6 mg Oral BID  . demeclocycline  300 mg Oral Q12H  . enoxaparin (LOVENOX) injection  40 mg Subcutaneous Q24H  . oxybutynin  10 mg Oral TID  . simvastatin  5 mg Oral q1800  . sodium chloride  3 mL Intravenous Q12H  . DISCONTD: colchicine  0.6 mg Oral Daily  . DISCONTD: enoxaparin  30 mg Subcutaneous Q24H  . DISCONTD: predniSONE  20 mg Oral BID   Continuous Infusions:   . sodium chloride 100 mL/hr at 09/14/11 1145   PRN Meds:.acetaminophen, acetaminophen, albuterol, ipratropium, ondansetron (ZOFRAN) IV, ondansetron, traMADol  Assessment: Active Problems:  SLEEP APNEA  PROSTATE CANCER, HX OF  History of CVA (cerebrovascular accident)  Vascular dementia  AKI (acute kidney injury)  Falls   Plan: #1 falls/weakness this is multifactorial, differential diagnosis includes infection, dehydration, gout, polymyalgia rheumatica? Rhabdomyolysis  ESR is elevated  CBC elevated Uric acid elevated  PT OT evaluation  , refuses placement  #2 acute  kidney injury in the setting of use of antihypertensive medications including ACE inhibitors, Bactrim, dehydration versus prerenal, versus post obstructive  Patient has a Foley catheter in place  Start IV fluids  Improving   #3 peripheral vascular disease ABI shows moderate to severe disease on the right, discussed with family. At this time they're not interested in having a vascular surgical consult   #4 dementia/CVA we'll obtain a PT OT consultation I doubt that the patient has had a CVA again. He has no focal neurologic deficits.   #5 right foot pain most likely  secondary to acute gout primarily versus claudication secondary to peripheral vascular disease, discontinue Cipro, discontinue prednisone do to confusion, started colchicine  He is a full code        LOS: 2 days   [facility] 09/14/2011, 1:18 PM

## 2011-09-14 NOTE — Progress Notes (Signed)
*  PRELIMINARY RESULTS* Echocardiogram 2D Echocardiogram has been performed.  Al Corpus 09/14/2011, 10:08 AM

## 2011-09-14 NOTE — Progress Notes (Signed)
Called to patient room because he pulled foley catheter out w/ bleeding.  Pt bed changed, cleaned up and penis cleansed with NS.  Small amount blood continues to leak out of penis.  MD notified and order received for bilateral wrist restraints.  Bil wrist restraints applied and family notified.  Family intends to send someone up to sit with him overnight.  Pt remains w/ episodes of confusion but is a&o and appropriate with responses when asked direct questions.

## 2011-09-15 DIAGNOSIS — N179 Acute kidney failure, unspecified: Secondary | ICD-10-CM

## 2011-09-15 DIAGNOSIS — R4182 Altered mental status, unspecified: Secondary | ICD-10-CM

## 2011-09-15 DIAGNOSIS — M109 Gout, unspecified: Secondary | ICD-10-CM

## 2011-09-15 LAB — BASIC METABOLIC PANEL
CO2: 20 mEq/L (ref 19–32)
Chloride: 102 mEq/L (ref 96–112)
GFR calc non Af Amer: 55 mL/min — ABNORMAL LOW (ref >90)
Glucose, Bld: 93 mg/dL (ref 70–99)
Potassium: 3.6 mEq/L (ref 3.5–5.1)
Sodium: 133 mEq/L — ABNORMAL LOW (ref 135–145)

## 2011-09-15 NOTE — Progress Notes (Signed)
Physical Therapy Treatment Patient Details Name: Manuel King MRN: [id number] DOB: [birth year] Today's Date: 09/15/2011 Time: 2130-8657 PT Time Calculation (min): 18 min  PT Assessment / Plan / Recommendation Comments on Treatment Session  Pt continues to demonstrate poor cognition and overall deconditioning. Family present and state they plan to take pt home. Continue to recommend  24 hour supervision/assist with +2 for mobility. Made all equipment recommendations in evaluation. Family may need to consider ambulance transport home.     Follow Up Recommendations  Skilled nursing facility (family refusing);Supervision/Assistance - 24 hour with Home health PT    Barriers to Discharge        Equipment Recommendations       Recommendations for Other Services OT consult  Frequency Min 3X/week   Plan Discharge plan remains appropriate    Precautions / Restrictions Precautions Precautions: Fall Restrictions Weight Bearing Restrictions: No   Pertinent Vitals/Pain     Mobility  Bed Mobility Bed Mobility: Supine to Sit Supine to Sit: 3: Mod assist Details for Bed Mobility Assistance: Assist for trunk to upright. Multimodal cues for safety, technique, hand placement. Increased time.  Transfers Transfers: Sit to Stand;Stand to Sit Sit to Stand: From elevated surface;With upper extremity assist;From bed Sit to Stand: Patient Percentage: 70% Stand to Sit: To chair/3-in-1;With upper extremity assist;With armrests;1: +2 Total assist Stand to Sit: Patient Percentage: 70% Details for Transfer Assistance: Multimodal cues for safety, technique, hand placement. Repeated  cues for pt to wait for therapist's instructions. Assist to rise, stabilize, control descent.  Ambulation/Gait Ambulation/Gait Assistance: 1: +2 Total assist Ambulation/Gait: Patient Percentage: 70% Ambulation Distance (Feet): 100 Feet Assistive device: Rolling walker Ambulation/Gait Assistance Details: Assist to stabiilze  throughout ambulation and to navigate with RW. Remains unsteady. Intermittent leaning to R side. Followed with recliner.  Gait Pattern: Step-through pattern;Trunk flexed;Decreased step length - right;Decreased stride length;Decreased step length - left    Exercises     PT Diagnosis:    PT Problem List:   PT Treatment Interventions:     PT Goals Acute Rehab PT Goals PT Goal: Supine/Side to Sit - Progress: Progressing toward goal PT Goal: Sit to Stand - Progress: Progressing toward goal PT Goal: Stand to Sit - Progress: Progressing toward goal PT Goal: Ambulate - Progress: Progressing toward goal  Visit Information  Last PT Received On: 09/15/11 Assistance Needed: +2    Subjective Data  Subjective: "Alright...Marland KitchenI'll get up" Patient Stated Goal: None stated by pt. Family present-they will take pt home   Cognition  Overall Cognitive Status: Impaired Area of Impairment: Attention;Following commands;Safety/judgement;Awareness of errors;Awareness of deficits;Problem solving Arousal/Alertness: Awake/alert Orientation Level: Disoriented to;Situation;Time Behavior During Session: [facility] for tasks performed Current Attention Level: Alternating Following Commands: Follows one step commands with increased time Safety/Judgement: Decreased awareness of safety precautions;Decreased safety judgement for tasks assessed;Decreased awareness of need for assistance;Impulsive    Balance     End of Session PT - End of Session Equipment Utilized During Treatment: Gait belt Activity Tolerance: Patient tolerated treatment well Patient left: in chair;with call bell/phone within reach;with chair alarm set;with family/visitor present    Rebeca Alert [facility] 09/15/2011, 3:20 PM [phone]

## 2011-09-15 NOTE — Progress Notes (Signed)
Talked to patient's daughter about DCP; patient wants to go home at discharge and family members wants to arrange for [facility] to see the patient at home; family member Elease Hashimoto chose [facility]; Norberta Keens RN with [facility] called for arrangements; Attending MD please write orders for HHRN/ PT/ OT/ nurses aide/ SW; DME - hospital bed, over bed table, bedside commode; [employer], BSN, [place].

## 2011-09-15 NOTE — Progress Notes (Signed)
DR. Susie Cassette, HAS BEEN NOTIFIED GAVE NEW ORDER TO D/C RESTRAINTS.  WILL CONTINUE TO MONITOR AND OBSERVE PT.

## 2011-09-15 NOTE — Progress Notes (Addendum)
Confused last night  pna cxr Notified family in the room  Objective: Vital signs in last 24 hours: Filed Vitals:   09/14/11 1442 09/14/11 2230 09/15/11 0719 09/15/11 1456  BP: 169/79 166/80 172/90 157/96  Pulse: 67 81 100 112  Temp: 99.1 F (37.3 C) 97.6 F (36.4 C) 97.8 F (36.6 C) 98.8 F (37.1 C)  TempSrc:  Oral Oral Oral  Resp: 19 19 19 20   Height:      Weight:      SpO2: 99% 98% 96% 98%    Intake/Output Summary (Last 24 hours) at 09/15/11 1500 Last data filed at 09/15/11 1351  Gross per 24 hour  Intake   5360 ml  Output    700 ml  Net   4660 ml    Weight change:    Head: Atraumatic.  Nose: Nose normal.  Mouth/Throat: Oropharynx is clear and moist.  Eyes: Conjunctivae are normal. Pupils are equal, round, and reactive to light. No scleral icterus.  Neck: Normal range of motion. Neck supple. No tracheal deviation present.  Cardiovascular: Normal rate, regular rhythm, normal heart sounds and intact distal pulses.  Pulmonary/Chest: Effort normal and breath sounds normal. No accessory muscle usage. No respiratory distress.  Abdominal: Soft. Bowel sounds are normal. He exhibits no distension and no mass. There is no tenderness. There is no rebound and no guarding.  Genitourinary:  No cva tenderness  Musculoskeletal: Normal range of motion. He exhibits no edema and no tenderness.  Distal pulses palp. No obvious cellulitis of foot.  Neurological: He is alert. No cranial nerve deficit.  Oriented to person and place. Motor intact bil. No pronator drift.  Skin: Skin is warm and dry. He is not diaphoretic.  Psychiatric: He has a normal mood and affect.      Lab Results: Results for orders placed during the hospital encounter of 09/12/11 (from the past 24 hour(s))  CK     Status: Abnormal   Collection Time   09/15/11  5:10 AM      Component Value Range   Total CK 555 (*) 7 - 232 (U/L)  BASIC METABOLIC PANEL     Status: Abnormal   Collection Time   09/15/11  5:10 AM        Component Value Range   Sodium 133 (*) 135 - 145 (mEq/L)   Potassium 3.6  3.5 - 5.1 (mEq/L)   Chloride 102  96 - 112 (mEq/L)   CO2 20  19 - 32 (mEq/L)   Glucose, Bld 93  70 - 99 (mg/dL)   BUN 19  6 - 23 (mg/dL)   Creatinine, Ser 4.54  0.50 - 1.35 (mg/dL)   Calcium 8.2 (*) 8.4 - 10.5 (mg/dL)   GFR calc non Af Amer 55 (*) >90 (mL/min)   GFR calc Af Amer 64 (*) >90 (mL/min)     Micro: Recent Results (from the past 240 hour(s))  URINE CULTURE     Status: Normal   Collection Time   09/12/11  4:17 PM      Component Value Range Status Comment   Specimen Description URINE, CATHETERIZED   Final    Special Requests NONE   Final    Culture  Setup Time [id number]   Final    Colony Count NO GROWTH   Final    Culture NO GROWTH   Final    Report Status 09/14/2011 FINAL   Final   CULTURE, BLOOD (ROUTINE X 2)     Status: Normal (Preliminary result)  Collection Time   09/12/11  7:00 PM      Component Value Range Status Comment   Specimen Description BLOOD RIGHT HAND   Final    Special Requests BOTTLES DRAWN AEROBIC AND ANAEROBIC 10CC   Final    Culture  Setup Time [id number]   Final    Culture     Final    Value:        BLOOD CULTURE RECEIVED NO GROWTH TO DATE CULTURE WILL BE HELD FOR 5 DAYS BEFORE ISSUING A FINAL NEGATIVE REPORT   Report Status PENDING   Incomplete   CULTURE, BLOOD (ROUTINE X 2)     Status: Normal (Preliminary result)   Collection Time   09/12/11  7:15 PM      Component Value Range Status Comment   Specimen Description BLOOD RIGHT ARM   Final    Special Requests BOTTLES DRAWN AEROBIC ONLY   Final    Culture  Setup Time [id number]   Final    Culture     Final    Value:        BLOOD CULTURE RECEIVED NO GROWTH TO DATE CULTURE WILL BE HELD FOR 5 DAYS BEFORE ISSUING A FINAL NEGATIVE REPORT   Report Status PENDING   Incomplete     Studies/Results: Dg Chest Port 1 View  09/14/2011  *RADIOLOGY REPORT*  Clinical Data: Pneumonia.Short of breath.  PORTABLE CHEST - 1  VIEW  Comparison: 09/12/2011.  Findings: Stable tortuosity of the thoracic aorta. Cardiopericardial silhouette is within normal limits for projection.  There is diffuse interstitial prominence and more focal opacity in the right upper lobe.  This may represent multifocal pneumonia.  The symmetry suggests an element of interstitial pulmonary edema.  The more focal right upper lobe opacity potentially could represent asymmetric/atypical pulmonary edema. Monitoring leads are projected over the chest.  Previously seen tiny pulmonary nodule in the left lung is not visualized.  Follow-up to ensure radiographic clearing recommended.  Clearing is usually observed at 4-6 weeks.  IMPRESSION: New interstitial and alveolar airspace opacity, most prominent in the right upper lobe.  This may represent pulmonary edema with an asymmetric/atypical component or pneumonia or both.  Original Report Authenticated By: Andreas Newport, M.D.    Medications:  Scheduled Meds:   . colchicine  0.6 mg Oral BID  . demeclocycline  300 mg Oral Q12H  . enoxaparin (LOVENOX) injection  40 mg Subcutaneous Q24H  . moxifloxacin  400 mg Intravenous Q24H  . OLANZapine  2.5 mg Oral QHS  . oxybutynin  10 mg Oral TID  . simvastatin  5 mg Oral q1800  . sodium chloride  3 mL Intravenous Q12H  . traZODone  25 mg Oral QHS   Continuous Infusions:   . sodium chloride 50 mL/hr at 09/15/11 1000   PRN Meds:.acetaminophen, acetaminophen, albuterol, ipratropium, ondansetron (ZOFRAN) IV, ondansetron, traMADol   Assessment: Active Problems:  SLEEP APNEA  PROSTATE CANCER, HX OF  History of CVA (cerebrovascular accident)  Vascular dementia  AKI (acute kidney injury)  Falls   Plan: #1 falls/weakness this is multifactorial, differential diagnosis includes PNA , dehydration, gout, polymyalgia rheumatica? Rhabdomyolysis  ESR is elevated  CBC elevated  Uric acid elevated  PT OT evaluation , refuses placement   #2 acute kidney injury in the  setting of use of antihypertensive medications including ACE inhibitors, Bactrim, dehydration versus prerenal, versus post obstructive  Patient has a Foley catheter in place  Decrease  IV fluids  Improving   #3 peripheral vascular  disease ABI shows moderate to severe disease on the right, discussed with family. At this time they're not interested in having a vascular surgical consult   #4 dementia/CVA we'll obtain a PT OT consultation I doubt that the patient has had a CVA again. He has no focal neurologic deficits.   #5 right foot pain most likely secondary to acute gout primarily versus claudication secondary to peripheral vascular disease, discontinue Cipro, discontinue prednisone do to confusion, started colchicine   6. PNA likely present upon admission, did not show up due to dehydration ,will teach as CAP   He is a full code         LOS: 3 days   [facility] 09/15/2011, 3:00 PM

## 2011-09-15 NOTE — Progress Notes (Signed)
09-15-11 NSG:  Pt remains very confused, restless, vss, family has remained at bedside.  Pt has been able to void, mostly incontinently bloody urine QS, denies urinary pain but has been noted to grimace when voiding.  VSS.  Bed alarm remains on, pt is now in a room close to the RN station.  Will continue to monitor

## 2011-09-15 NOTE — Progress Notes (Signed)
INITIAL ADULT NUTRITION ASSESSMENT Date: 09/15/2011   Time: 6:56 PM Reason for Assessment: Low Braden  ASSESSMENT: Male 78 y.o.  Dx: PNA, acute gout,PVD, Acute Kidney Injury, Falls and weekness  Hx:  Past Medical History  Diagnosis Date  . Hypertension   . CVA (cerebral vascular accident)   . Dementia   . Prostate cancer   . Cellulitis currently    right foot   Past Surgical History  Procedure Date  . Neck surgery   . Back surgery   . Spider bite surgery     on left leg  . Prostate seed implants     Related Meds:  Scheduled Meds:   . colchicine  0.6 mg Oral BID  . demeclocycline  300 mg Oral Q12H  . enoxaparin (LOVENOX) injection  40 mg Subcutaneous Q24H  . moxifloxacin  400 mg Intravenous Q24H  . OLANZapine  2.5 mg Oral QHS  . oxybutynin  10 mg Oral TID  . simvastatin  5 mg Oral q1800  . sodium chloride  3 mL Intravenous Q12H  . traZODone  25 mg Oral QHS   Continuous Infusions:   . sodium chloride 50 mL/hr at 09/15/11 1000   PRN Meds:.acetaminophen, acetaminophen, albuterol, ipratropium, ondansetron (ZOFRAN) IV, ondansetron, traMADol   Ht: 6\' 4"  (193 cm)  Wt: 200 lb 9.9 oz (91 kg)  Ideal Wt: 86kg % Ideal Wt: 106  Usual Wt:  Wt Readings from Last 15 Encounters:  09/12/11 200 lb 9.9 oz (91 kg)  05/10/11 211 lb (95.709 kg)  09/29/08 192 lb (87.091 kg)    % Usual Wt: 95 from 4 months ago  Body mass index is 24.42 kg/(m^2).  Food/Nutrition Related Hx: Spoke with grandson who cares for pt.  Pt usually ate very well at home.  Had an occasional day of not eating much.  Dislikes milk but tolerates dairy products.  Labs:  CMP     Component Value Date/Time   NA 133* 09/15/2011 0510   K 3.6 09/15/2011 0510   CL 102 09/15/2011 0510   CO2 20 09/15/2011 0510   GLUCOSE 93 09/15/2011 0510   BUN 19 09/15/2011 0510   CREATININE 1.21 09/15/2011 0510   CALCIUM 8.2* 09/15/2011 0510   PROT 7.2 09/12/2011 1329   ALBUMIN 2.9* 09/12/2011 1329   AST 48* 09/12/2011 1329   ALT 9 09/12/2011 1329   ALKPHOS 55 09/12/2011 1329   BILITOT 0.2* 09/12/2011 1329   GFRNONAA 55* 09/15/2011 0510   GFRAA 64* 09/15/2011 0510    I/O last 3 completed shifts: In: 5760 [P.O.:1960; I.V.:3600; IV Piggyback:200] Out: 5980 [Urine:5980] Total I/O In: 1230 [P.O.:480; I.V.:500; IV Piggyback:250] Out: -    Diet Order: Dysphagia  3- eating 25-40% meals  Supplements/Tube Feeding:none IVF:    sodium chloride Last Rate: 50 mL/hr at 09/15/11 1000    Estimated Nutritional Needs:p[er day   Kcal: 2100-2200 Protein: 100-110g  Fluid: >2.1L  NUTRITION DIAGNOSIS: -Inadequate oral intake (NI-2.1).  Status: Ongoing  RELATED TO: illness  AS EVIDENCE BY: documented poor intake  MONITORING/EVALUATION(Goals): Monitor:  Intake, weight, labs Goal:  Meals and supplements to meet >75% estimated needs.  EDUCATION NEEDS: -No education needs identified at this time  INTERVENTION: Continue Current diet.  Add Magic cup bid to supplement calories and protein 280 kcal, 8 g protein/4 oz.  Dietitian [phone]  DOCUMENTATION CODES Per approved criteria  -Not Applicable    Jeoffrey Massed 09/15/2011, 6:56 PM

## 2011-09-15 NOTE — Progress Notes (Signed)
PT. IS STILL OUT OF RESTRAINTS AND DOING WELL, FAMILY IS STAYING WITH PT. AT THIS TIME, WILL CONTINUE TO MONITOR AND OBSERVE PT.

## 2011-09-15 NOTE — Progress Notes (Signed)
PT/OT/ST Cancellation Note  ___Treatment cancelled today due to medical issues with patient which prohibited therapy  ___ Treatment cancelled today due to patient receiving procedure or test   ___ Treatment cancelled today due to patient's refusal to participate   _x__ Treatment cancelled today due to family member declining PT/OT at this time-"He's sleeping. He needs to rest. He was up all night." Will check back another day. Thanks.     Signature: Rebeca Alert, PT [phone]  Judithann Sauger OTR/L [phone] 09/15/2011

## 2011-09-15 NOTE — Progress Notes (Signed)
GOING TO TRY PT. OUT OF RESTRAINTS, FAMILY IS AVAILABLE AT THIS TIME, PT. IS VERY CALM AND COOPERATIVE, NOTIFIED DR. ABROL AWAITING A CALL BACK.  WILL CONTINUE TO MONITOR AND OBSERVE PT.

## 2011-09-15 NOTE — Progress Notes (Signed)
   CARE MANAGEMENT NOTE 09/15/2011  Patient:  Manuel King, Manuel King   Account Number:  [id number]  Date Initiated:  09/15/2011  Documentation initiated by:  Jiles Crocker  Subjective/Objective Assessment:   ADMITTED WITH WEAKNESS, AKI, PROSTATE CA     Action/Plan:   Primary Care Physician:RAMACHANDRAN, LIVES AT HOME ALONE   Anticipated DC Date:  09/22/2011   Anticipated DC Plan:  SKILLED NURSING FACILITY  In-house referral  Clinical Social Worker                  Status of service:  In process, will continue to follow Medicare Important Message given?  NA - LOS <3 / Initial given by admissions (If response is "NO", the following Medicare IM given date fields will be blank)  Per UR Regulation:  Reviewed for med. necessity/level of care/duration of stay  Comments:  09/15/2011- B Lilinoe Acklin RN,BSN,MHA

## 2011-09-16 DIAGNOSIS — N179 Acute kidney failure, unspecified: Secondary | ICD-10-CM

## 2011-09-16 DIAGNOSIS — J189 Pneumonia, unspecified organism: Secondary | ICD-10-CM

## 2011-09-16 DIAGNOSIS — R4182 Altered mental status, unspecified: Secondary | ICD-10-CM

## 2011-09-16 DIAGNOSIS — M109 Gout, unspecified: Secondary | ICD-10-CM

## 2011-09-16 LAB — COMPREHENSIVE METABOLIC PANEL
ALT: 14 U/L (ref 0–53)
Alkaline Phosphatase: 61 U/L (ref 39–117)
CO2: 20 mEq/L (ref 19–32)
Calcium: 7.7 mg/dL — ABNORMAL LOW (ref 8.4–10.5)
GFR calc Af Amer: 73 mL/min — ABNORMAL LOW (ref >90)
GFR calc non Af Amer: 63 mL/min — ABNORMAL LOW (ref >90)
Glucose, Bld: 109 mg/dL — ABNORMAL HIGH (ref 70–99)
Potassium: 3.7 mEq/L (ref 3.5–5.1)
Sodium: 133 mEq/L — ABNORMAL LOW (ref 135–145)

## 2011-09-16 LAB — CBC
Hemoglobin: 11.9 g/dL — ABNORMAL LOW (ref 13.0–17.0)
MCH: 26.4 pg (ref 26.0–34.0)
RBC: 4.5 MIL/uL (ref 4.22–5.81)

## 2011-09-16 MED ORDER — GUAIFENESIN-DM 100-10 MG/5ML PO SYRP: 5.0000 mL | ORAL_SOLUTION | Freq: Three times a day (TID) | ORAL | Status: AC | PRN

## 2011-09-16 MED ORDER — GABAPENTIN 600 MG PO TABS: 300.0000 mg | ORAL_TABLET | Freq: Two times a day (BID) | ORAL | Status: DC

## 2011-09-16 MED ORDER — IPRATROPIUM-ALBUTEROL 18-103 MCG/ACT IN AERO: 2.0000 | INHALATION_SPRAY | Freq: Four times a day (QID) | RESPIRATORY_TRACT | Status: DC | PRN

## 2011-09-16 MED ORDER — COLCHICINE 0.6 MG PO TABS: 0.6000 mg | ORAL_TABLET | Freq: Two times a day (BID) | ORAL | Status: DC

## 2011-09-16 MED ORDER — MOXIFLOXACIN HCL 400 MG PO TABS: 400.0000 mg | ORAL_TABLET | Freq: Every day | ORAL | Status: AC

## 2011-09-16 NOTE — Plan of Care (Signed)
Problem: Phase I Progression Outcomes Goal: Voiding-avoid urinary catheter unless indicated Outcome: Completed/Met Date Met:  09/16/11 Applied condom catheter

## 2011-09-16 NOTE — Progress Notes (Signed)
HHC arranged with [facility] also DME; see note from 09/15/2011; Abelino Derrick RN, BSN, [employer]

## 2011-09-16 NOTE — Progress Notes (Signed)
Clinical Social Work Department BRIEF PSYCHOSOCIAL ASSESSMENT 09/16/2011  Patient:  Manuel King, Manuel King     Account Number:  [id number]     Admit date:  09/14/2011  Clinical Social Worker:  Orpah Greek  Date/Time:  09/16/2011 04:36 PM  Referred by:  Physician  Date Referred:  09/16/2011 Referred for  SNF Placement   Other Referral:   Interview type:  Family Other interview type:    PSYCHOSOCIAL DATA Living Status:  ALONE Admitted from facility:   Level of care:   Primary support name:  Wylie Hail (daughter) h#: [phone] c#: [phone] Primary support relationship to patient:  CHILD, ADULT Degree of support available:   good    CURRENT CONCERNS Current Concerns  Post-Acute Placement   Other Concerns:    SOCIAL WORK ASSESSMENT / PLAN CSW spoke with patient's daughters, Wylie Hail & Sharion Dove (h#: [phone] c#: [phone]) re: discharge planning. Patient was set to discharge home but family feels that he would benefit from a short stay at [facility].   Assessment/plan status:  Information/Referral to [facility] Other assessment/ plan:   Information/referral to community resources:   CSW completed FL2 and faxed information to [employer], as requested by family as they have other family members currently @ [facility].    PATIENT'S/FAMILY'S RESPONSE TO PLAN OF CARE: Family seemed agreeable to SNF plan, stated they don't want him thinking it's a "nursing home" or long-term/permanent, just short-term for strength building to return home.        Unice Bailey, LCSWA [phone]

## 2011-09-16 NOTE — Discharge Summary (Signed)
Physician Discharge Summary  Manuel King MRN: [id number] DOB/AGE: 1929/11/06 78 y.o.  PCP: [name], MD, MD   Admit date: 09/12/2011 Discharge date: 09/16/2011  Discharge Diagnoses:   Community-acquired pneumonia Acute gout  SLEEP APNEA  PROSTATE CANCER, HX OF  History of CVA (cerebrovascular accident)  Vascular dementia  AKI (acute kidney injury)  Falls   Medication List  As of 09/16/2011  2:31 PM   STOP taking these medications         lisinopril 10 MG tablet         TAKE these medications         albuterol-ipratropium 18-103 MCG/ACT inhaler   Commonly known as: COMBIVENT   Inhale 2 puffs into the lungs every 6 (six) hours as needed for wheezing.      allopurinol 100 MG tablet   Commonly known as: ZYLOPRIM   Take 100 mg by mouth daily.      amLODipine 5 MG tablet   Commonly known as: NORVASC   Take 5 mg by mouth daily.      aspirin EC 81 MG tablet   Take 81 mg by mouth daily.      colchicine 0.6 MG tablet   Take 1 tablet (0.6 mg total) by mouth 2 (two) times daily.      divalproex 250 MG DR tablet   Commonly known as: DEPAKOTE   Take 250 mg by mouth 2 (two) times daily. Take one tablet in the am and 2 tablets in the pm      donepezil 5 MG tablet   Commonly known as: ARICEPT   Take 5 mg by mouth daily.      gabapentin 600 MG tablet   Commonly known as: NEURONTIN   Take 0.5 tablets (300 mg total) by mouth 2 (two) times daily.      guaiFENesin-dextromethorphan 100-10 MG/5ML syrup   Commonly known as: ROBITUSSIN DM   Take 5 mLs by mouth 3 (three) times daily as needed for cough.      hydrochlorothiazide 25 MG tablet   Commonly known as: HYDRODIURIL   Take 12.5 mg by mouth daily. Take 1/2 tablet by mouth once daily      moxifloxacin 400 MG tablet   Commonly known as: AVELOX   Take 1 tablet (400 mg total) by mouth daily.      multivitamin per tablet   Take 1 tablet by mouth daily.      oxybutynin 10 MG 24 hr tablet   Commonly  known as: DITROPAN-XL   Take 10 mg by mouth 3 (three) times daily.      pravastatin 40 MG tablet   Commonly known as: PRAVACHOL   TAKE ONE TABLET BY MOUTH EVERY DAY NEEDS  OFFICE  VISIT  FOR  MORE  REFILLS      traMADol 50 MG tablet   Commonly known as: ULTRAM   Take 1 tablet (50 mg total) by mouth every 6 (six) hours as needed for pain.            Discharge Condition: Stable Disposition: 06-Home-Health Care Svc   Consults: PT OT   Significant Diagnostic Studies: Dg Chest 2 View  09/12/2011  *RADIOLOGY REPORT*  Clinical Data: Weakness, shortness of breath, smoker  CHEST - 2 VIEW  Comparison: 09/04/2008  Findings: Normal heart size and pulmonary vascularity. Calcified tortuous aorta. Lungs clear. No pleural effusion or pneumothorax. Questionable nodular density lower left chest. Bones unremarkable.  IMPRESSION: Questionable nodular density lower left chest,  could potentially represent a nipple shadow; recommend upright PA chest radiograph with nipple markers to assess.  Original Report Authenticated By: Lollie Marrow, M.D.   Dg Ankle Complete Right  09/09/2011  *RADIOLOGY REPORT*  Clinical Data: Pain  RIGHT ANKLE - COMPLETE 3+ VIEW  Comparison: None.  Findings: There is no evidence of bone, joint, or soft tissue abnormality.  IMPRESSION: Negative right ankle.  Original Report Authenticated By: Brandon Melnick, M.D.   Ct Head Wo Contrast  09/12/2011  *RADIOLOGY REPORT*  Clinical Data: Increasing weakness.  Altered level of consciousness. Dementia.  High blood pressure.  Prostate cancer.  CT HEAD WITHOUT CONTRAST  Technique:  Contiguous axial images were obtained from the base of the skull through the vertex without contrast.  Comparison: 01/03/2011 head CT.  05/10/2011 neck CT.  Findings: No intracranial hemorrhage.  Remote infarct right caudate head/superior aspect of the right basal ganglia.  Prominent small vessel disease type changes.  No CT evidence of large acute infarct.  Global  atrophy without hydrocephalus.  Vascular calcifications.  No intracranial mass lesion detected on this unenhanced exam.  No sclerotic focus of the calvarium.  Visualized paranasal sinuses and mastoid air cells clear.  IMPRESSION: No acute abnormality.  Please see above.  Original Report Authenticated By: Fuller Canada, M.D.   Dg Chest Port 1 View  09/14/2011  *RADIOLOGY REPORT*  Clinical Data: Pneumonia.Short of breath.  PORTABLE CHEST - 1 VIEW  Comparison: 09/12/2011.  Findings: Stable tortuosity of the thoracic aorta. Cardiopericardial silhouette is within normal limits for projection.  There is diffuse interstitial prominence and more focal opacity in the right upper lobe.  This may represent multifocal pneumonia.  The symmetry suggests an element of interstitial pulmonary edema.  The more focal right upper lobe opacity potentially could represent asymmetric/atypical pulmonary edema. Monitoring leads are projected over the chest.  Previously seen tiny pulmonary nodule in the left lung is not visualized.  Follow-up to ensure radiographic clearing recommended.  Clearing is usually observed at 4-6 weeks.  IMPRESSION: New interstitial and alveolar airspace opacity, most prominent in the right upper lobe.  This may represent pulmonary edema with an asymmetric/atypical component or pneumonia or both.  Original Report Authenticated By: Andreas Newport, M.D.   Dg Foot Complete Right  09/09/2011  *RADIOLOGY REPORT*  Clinical Data: Right-sided foot pain radiating through metatarsals. Ankle swelling.No injury.  RIGHT FOOT COMPLETE - 3+ VIEW  Comparison: None.  Findings: Mild hallux valgus with moderate first MTP joint osteoarthritis.  Diffuse osteopenia is present.  Dystrophic proximal phalanx of the small toe incidentally noted, of likely no clinical significance.  Mild midfoot osteoarthritis is present. The no fracture. Dystrophic great toe toenail, likely an onychomycosis.  IMPRESSION: Osteopenia and first MTP  joint osteoarthritis.  No acute osseous abnormality.  Original Report Authenticated By: Andreas Newport, M.D.     Microbiology: Recent Results (from the past 240 hour(s))  URINE CULTURE     Status: Normal   Collection Time   09/12/11  4:17 PM      Component Value Range Status Comment   Specimen Description URINE, CATHETERIZED   Final    Special Requests NONE   Final    Culture  Setup Time [id number]   Final    Colony Count NO GROWTH   Final    Culture NO GROWTH   Final    Report Status 09/14/2011 FINAL   Final   CULTURE, BLOOD (ROUTINE X 2)     Status: Normal (Preliminary result)  Collection Time   09/12/11  7:00 PM      Component Value Range Status Comment   Specimen Description BLOOD RIGHT HAND   Final    Special Requests BOTTLES DRAWN AEROBIC AND ANAEROBIC 10CC   Final    Culture  Setup Time [id number]   Final    Culture     Final    Value:        BLOOD CULTURE RECEIVED NO GROWTH TO DATE CULTURE WILL BE HELD FOR 5 DAYS BEFORE ISSUING A FINAL NEGATIVE REPORT   Report Status PENDING   Incomplete   CULTURE, BLOOD (ROUTINE X 2)     Status: Normal (Preliminary result)   Collection Time   09/12/11  7:15 PM      Component Value Range Status Comment   Specimen Description BLOOD RIGHT ARM   Final    Special Requests BOTTLES DRAWN AEROBIC ONLY   Final    Culture  Setup Time [id number]   Final    Culture     Final    Value:        BLOOD CULTURE RECEIVED NO GROWTH TO DATE CULTURE WILL BE HELD FOR 5 DAYS BEFORE ISSUING A FINAL NEGATIVE REPORT   Report Status PENDING   Incomplete      Labs: Results for orders placed during the hospital encounter of 09/12/11 (from the past 48 hour(s))  CK     Status: Abnormal   Collection Time   09/15/11  5:10 AM      Component Value Range Comment   Total CK 555 (*) 7 - 232 (U/L)   BASIC METABOLIC PANEL     Status: Abnormal   Collection Time   09/15/11  5:10 AM      Component Value Range Comment   Sodium 133 (*) 135 - 145 (mEq/L)    Potassium  3.6  3.5 - 5.1 (mEq/L)    Chloride 102  96 - 112 (mEq/L)    CO2 20  19 - 32 (mEq/L)    Glucose, Bld 93  70 - 99 (mg/dL)    BUN 19  6 - 23 (mg/dL)    Creatinine, Ser 2.13  0.50 - 1.35 (mg/dL)    Calcium 8.2 (*) 8.4 - 10.5 (mg/dL)    GFR calc non Af Amer 55 (*) >90 (mL/min)    GFR calc Af Amer 64 (*) >90 (mL/min)   CBC     Status: Abnormal   Collection Time   09/16/11  9:55 AM      Component Value Range Comment   WBC 8.7  4.0 - 10.5 (K/uL)    RBC 4.50  4.22 - 5.81 (MIL/uL)    Hemoglobin 11.9 (*) 13.0 - 17.0 (g/dL)    HCT 08.6 (*) 57.8 - 52.0 (%)    MCV 77.3 (*) 78.0 - 100.0 (fL)    MCH 26.4  26.0 - 34.0 (pg)    MCHC 34.2  30.0 - 36.0 (g/dL)    RDW 46.9 (*) 62.9 - 15.5 (%)    Platelets 214  150 - 400 (K/uL)   COMPREHENSIVE METABOLIC PANEL     Status: Abnormal   Collection Time   09/16/11  9:55 AM      Component Value Range Comment   Sodium 133 (*) 135 - 145 (mEq/L)    Potassium 3.7  3.5 - 5.1 (mEq/L)    Chloride 101  96 - 112 (mEq/L)    CO2 20  19 - 32 (mEq/L)    Glucose,  Bld 109 (*) 70 - 99 (mg/dL)    BUN 16  6 - 23 (mg/dL)    Creatinine, Ser 4.09  0.50 - 1.35 (mg/dL)    Calcium 7.7 (*) 8.4 - 10.5 (mg/dL)    Total Protein 6.7  6.0 - 8.3 (g/dL)    Albumin 2.6 (*) 3.5 - 5.2 (g/dL)    AST 22  0 - 37 (U/L)    ALT 14  0 - 53 (U/L)    Alkaline Phosphatase 61  39 - 117 (U/L)    Total Bilirubin 0.5  0.3 - 1.2 (mg/dL)    GFR calc non Af Amer 63 (*) >90 (mL/min)    GFR calc Af Amer 73 (*) >90 (mL/min)      HPI 78 y/o male with hx of HTN, CVA, vascular dementia, prostate ca , gout and active smoker who presented with history of feeling weak, inability to ambulate. He was in the ER on Tuesday with right foot pain, he was treated for cellulitis and was prescribed Keflex and Bactrim and Ultram for pain. He has taken it for 3 days. Over the course of the last 3 days the patient has had increasing weakness, generalized tremors, low-grade fever at home. He also has been answering questions  inappropriately. He was seen by his primary care provider and was referred to be evaluated in the ED for possibility of stroke.  The patient was on allopurinol up until January and it was discontinued by his primary care provider.   HOSPITAL COURSE:   #1 acute gout the patient was initially treated with prednisone in the setting of his renal insufficiency, subsequently changed quarter seen twice a day His allopurinol has been resumed. His uric acid was 12.   #2 acute kidney injury the setting of rhabdomyolysis with a CK of 3208 upon admission. Could also be prerenal,cr 2.74, cr 1.08, not improved. Repeat CK was 555  #3 community-acquired pneumonia the patient was started on Avelox his pulmonary status has been stable. We will continue with Avelox for another 7 days  #4 disposition advanced dementia the patient has declined SNF placement home health PT and OT is being set up   Discharge Exam:  Blood pressure 169/94, pulse 108, temperature 98.5 F (36.9 C), temperature source Oral, resp. rate 20, height 6\' 4"  (1.93 m), weight 91 kg (200 lb 9.9 oz), SpO2 96.00%.   Head: Atraumatic.  Nose: Nose normal.  Mouth/Throat: Oropharynx is clear and moist.  Eyes: Conjunctivae are normal. Pupils are equal, round, and reactive to light. No scleral icterus.  Neck: Normal range of motion. Neck supple. No tracheal deviation present.  Cardiovascular: Normal rate, regular rhythm, normal heart sounds and intact distal pulses.  Pulmonary/Chest: Effort normal and breath sounds normal. No accessory muscle usage. No respiratory distress.  Abdominal: Soft. Bowel sounds are normal. He exhibits no distension and no mass. There is no tenderness. There is no rebound and no guarding.  Genitourinary:  No cva tenderness  Musculoskeletal: Normal range of motion. He exhibits no edema and no tenderness.  Distal pulses palp. No obvious cellulitis of foot.  Neurological: He is alert. No cranial nerve deficit.  Oriented  to person and place. Motor intact bil. No pronator drift.  Skin: Skin is warm and dry. He is not diaphoretic.  Psychiatric: He has a normal mood and affect.      Follow-up Information    Follow up with [facility], [place]. (bmp, cbc one week, neurology referral for dementia)    Contact  information:   [address], [address] [place] [address] [phone]          Signed: Richarda Overlie 09/16/2011, 2:31 PM

## 2011-09-16 NOTE — Evaluation (Signed)
Occupational Therapy Evaluation Patient Details Name: Manuel King MRN: [id number] DOB: 1929-12-31 Today's Date: 09/16/2011 Time: 1035-1100 OT Time Calculation (min): 25 min  OT Assessment / Plan / Recommendation Clinical Impression  78 y/o male with hx of HTN, CVA, vascular dementia, prostate ca , gout and active smoker who presented with history of feeling weak, inability to ambulate. Displays decreased strength, functional mobility and will benefit from skilled OT services to improver his independence with self care tasks.     OT Assessment  Patient needs continued OT Services    Follow Up Recommendations  Would benefit from Skilled nursing facility; per notes, family wanting to take pt home. If home, recommend Home health OT and [facility] aide.   Barriers to Discharge      Equipment Recommendations  Hospital bed;3 in 1 bedside comode;Other (comment) (HH aide)    Recommendations for Other Services    Frequency  Min 2X/week    Precautions / Restrictions Precautions Precautions: Fall Restrictions Weight Bearing Restrictions: No        ADL  Eating/Feeding: Not assessed Grooming: Simulated;Wash/dry hands;Supervision/safety;Set up Where Assessed - Grooming: Unsupported sitting Upper Body Bathing: Simulated;Chest;Right arm;Left arm;Abdomen;Supervision/safety;Set up Where Assessed - Upper Body Bathing: Sitting, bed;Unsupported Lower Body Bathing: Simulated;+2 Total assistance;Comment for patient %;Other (comment) (pt 60%) Where Assessed - Lower Body Bathing: Sit to stand from bed Upper Body Dressing: Simulated;Minimal assistance Where Assessed - Upper Body Dressing: Sitting, bed;Unsupported Lower Body Dressing: Simulated;+2 Total assistance;Comment for patient %;Other (comment) (pt 50%) Where Assessed - Lower Body Dressing: Sit to stand from bed Toilet Transfer: Simulated;+2 Total assistance;Comment for patient %;Other (comment) (pt 70%) Toilet Transfer Method: Stand  pivot Toileting - Clothing Manipulation: Simulated;+2 Total assistance;Comment for patient %;Other (comment) (pt 60%) Where Assessed - Toileting Clothing Manipulation: Standing Toileting - Hygiene: Simulated;+2 Total assistance;Comment for patient %;Other (comment) (pt 60%) Where Assessed - Toileting Hygiene: Standing Tub/Shower Transfer: Not assessed Tub/Shower Transfer Method: Not assessed Equipment Used: Gait belt    OT Diagnosis: Generalized weakness  OT Problem List: Decreased strength;Decreased activity tolerance;Decreased knowledge of use of DME or AE OT Treatment Interventions: Self-care/ADL training;Therapeutic activities;DME and/or AE instruction;Patient/family education   OT Goals Acute Rehab OT Goals OT Goal Formulation: With patient/family Time For Goal Achievement: 09/30/11 Potential to Achieve Goals: Good ADL Goals Pt Will Perform Grooming: with mod assist;Standing at sink ADL Goal: Grooming - Progress: Goal set today Pt Will Perform Lower Body Bathing: with mod assist;Sit to stand from chair;Sit to stand from bed ADL Goal: Lower Body Bathing - Progress: Goal set today Pt Will Perform Lower Body Dressing: with mod assist;Sit to stand from chair;Sit to stand from bed ADL Goal: Lower Body Dressing - Progress: Goal set today Pt Will Transfer to Toilet: with mod assist;3-in-1;with DME;Ambulation ADL Goal: Toilet Transfer - Progress: Goal set today Pt Will Perform Toileting - Clothing Manipulation: with mod assist;Standing ADL Goal: Toileting - Clothing Manipulation - Progress: Goal set today  Visit Information  Last OT Received On: 09/16/11 Assistance Needed: +2    Subjective Data  Subjective: pt says hi when greeted Patient Stated Goal: agreeable up to chair. none stated independently. "I'll try"   Prior Functioning  Home Living Lives With: Alone Available Help at Discharge: Family Type of Home: House Home Access: Level entry Home Layout: One level Bathroom  Shower/Tub: Engineer, manufacturing systems: Standard Home Adaptive Equipment: Walker - rolling;Straight cane;Wheelchair - manual;Shower chair with back Additional Comments: will need hospital bed, BSC. Will also likely need ambulance transport  home if family decides to take pt home. Prior Function Level of Independence: Independent;Needs assistance Needs Assistance: Light Housekeeping;Meal Prep Meal Prep: Total Light Housekeeping: Total Able to Take Stairs?: No Driving: No Communication Communication: No difficulties    Cognition  Overall Cognitive Status: Impaired Area of Impairment: Attention;Following commands;Safety/judgement;Awareness of deficits Arousal/Alertness: Awake/alert Orientation Level: Time;Situation Behavior During Session: [facility] for tasks performed Following Commands: Follows one step commands with increased time Safety/Judgement: Decreased awareness of safety precautions;Decreased safety judgement for tasks assessed    Extremity/Trunk Assessment Right Upper Extremity Assessment RUE ROM/Strength/Tone: Within functional levels Left Upper Extremity Assessment LUE ROM/Strength/Tone: Within functional levels   Mobility Bed Mobility Bed Mobility: Supine to Sit Supine to Sit: 3: Mod assist;With rails;HOB elevated Details for Bed Mobility Assistance: assist for trunk to upright, multimodal cues for hand placement, technique. Increased time and several commands to initiate. Transfers Transfers: Sit to Stand;Stand to Sit Sit to Stand: 1: +2 Total assist;With upper extremity assist Sit to Stand: Patient Percentage: 70% Stand to Sit: 1: +2 Total assist;With upper extremity assist Stand to Sit: Patient Percentage: 70% Details for Transfer Assistance: multimodal cues for safety, hand placement, backing up to chair completely before sitting, aligning self with chair properly   Exercise    Balance    End of Session OT - End of Session Equipment Utilized During Treatment:  Gait belt Activity Tolerance: Patient tolerated treatment well Patient left: in chair;with call bell/phone within reach;with chair alarm set;with family/visitor present   Lennox Laity [phone] 09/16/2011, 11:44 AM

## 2011-09-16 NOTE — Progress Notes (Signed)
Patient set to discharge to [facility] tomorrow. Patient's daughters agreeable & Dr. Susie Cassette aware.   Clinical Social Work Department CLINICAL SOCIAL WORK PLACEMENT NOTE 09/16/2011  Patient:  Manuel King, Manuel King  Account Number:  [id number] Admit date:  09/14/2011  Clinical Social Worker:  Orpah Greek  Date/time:  09/16/2011 04:40 PM  Clinical Social Work is seeking post-discharge placement for this patient at the following level of care:   SKILLED NURSING   (*CSW will update this form in Epic as items are completed)   09/16/2011  Patient/family provided with [facility] Department of Clinical Social Work's list of facilities offering this level of care within the geographic area requested by the patient (or if unable, by the patient's family).  09/16/2011  Patient/family informed of their freedom to choose among providers that offer the needed level of care, that participate in Medicare, Medicaid or managed care program needed by the patient, have an available bed and are willing to accept the patient.  09/16/2011  Patient/family informed of [facility]' ownership interest in [facility], as well as of the fact that they are under no obligation to receive care at this facility.  PASARR submitted to EDS on 09/16/2011 PASARR number received from [employer] on 09/17/2011  FL2 transmitted to all facilities in geographic area requested by pt/family on  09/16/2011 FL2 transmitted to all facilities within larger geographic area on   Patient informed that his/her managed care company has contracts with or will negotiate with  certain facilities, including the following:     Patient/family informed of bed offers received:  09/16/2011 Patient chooses bed at [facility] Physician recommends and patient chooses bed at    Patient to be transferred to [facility] on  09/17/2011 Patient to be transferred to facility by [facility]  The following  physician request were entered in Epic:   Additional Comments:    Unice Bailey, LCSWA [phone]

## 2011-09-16 NOTE — Progress Notes (Signed)
Pt has safety mitts on at time.  Pt's brother and nephew are at the bedside and monitoring pt as well.

## 2011-09-17 DIAGNOSIS — M109 Gout, unspecified: Secondary | ICD-10-CM

## 2011-09-17 DIAGNOSIS — N179 Acute kidney failure, unspecified: Secondary | ICD-10-CM

## 2011-09-17 DIAGNOSIS — R4182 Altered mental status, unspecified: Secondary | ICD-10-CM

## 2011-09-17 NOTE — Progress Notes (Signed)
Patient set to discharge to [facility] today. Patient & daughter at bedside aware. [facility] called for transport.   Unice Bailey, LCSWA [phone]

## 2011-09-17 NOTE — Discharge Instructions (Signed)

## 2011-09-17 NOTE — Discharge Summary (Signed)
Patient ID: Manuel King MRN: [id number] DOB/AGE: January 11, 1930 78 y.o.  Admit date: 09/12/2011 Discharge date: 09/17/2011  Primary Care Physician:  [name], MD, MD  Discharge Diagnoses:  Community acquired pneumonia, high risk fall with recurrent falls, vascular dementia, progressive failure to thrive  Present on Admission:  Active Problems:   Community acquired pneumonia   Falls   AKI (acute kidney injury)   PROSTATE CANCER, HX OF   History of CVA (cerebrovascular accident)   Vascular dementia   Acute gout   SLEEP APNEA  Medication List  As of 09/17/2011  8:53 AM   STOP taking these medications         lisinopril 10 MG tablet         TAKE these medications         albuterol-ipratropium 18-103 MCG/ACT inhaler   Commonly known as: COMBIVENT   Inhale 2 puffs into the lungs every 6 (six) hours as needed for wheezing.      allopurinol 100 MG tablet   Commonly known as: ZYLOPRIM   Take 100 mg by mouth daily.      amLODipine 5 MG tablet   Commonly known as: NORVASC   Take 5 mg by mouth daily.      aspirin EC 81 MG tablet   Take 81 mg by mouth daily.      colchicine 0.6 MG tablet   Take 1 tablet (0.6 mg total) by mouth 2 (two) times daily.      divalproex 250 MG DR tablet   Commonly known as: DEPAKOTE   Take 250 mg by mouth 2 (two) times daily. Take one tablet in the am and 2 tablets in the pm      donepezil 5 MG tablet   Commonly known as: ARICEPT   Take 5 mg by mouth daily.      gabapentin 600 MG tablet   Commonly known as: NEURONTIN   Take 0.5 tablets (300 mg total) by mouth 2 (two) times daily.      guaiFENesin-dextromethorphan 100-10 MG/5ML syrup   Commonly known as: ROBITUSSIN DM   Take 5 mLs by mouth 3 (three) times daily as needed for cough.      hydrochlorothiazide 25 MG tablet   Commonly known as: HYDRODIURIL   Take 12.5 mg by mouth daily. Take 1/2 tablet by mouth once daily      moxifloxacin 400 MG tablet   Commonly known as:  AVELOX   Take 1 tablet (400 mg total) by mouth daily.      multivitamin per tablet   Take 1 tablet by mouth daily.      oxybutynin 10 MG 24 hr tablet   Commonly known as: DITROPAN-XL   Take 10 mg by mouth 3 (three) times daily.      pravastatin 40 MG tablet   Commonly known as: PRAVACHOL   TAKE ONE TABLET BY MOUTH EVERY DAY NEEDS  OFFICE  VISIT  FOR  MORE  REFILLS      traMADol 50 MG tablet   Commonly known as: ULTRAM   Take 1 tablet (50 mg total) by mouth every 6 (six) hours as needed for pain.            Disposition and Follow-up: Pt stable for discharge and will need to see PCP in 1-2 weeks post discharge. Will need to have BMP, CBC obtained to make sure that electrolytes and Hg/Hct are stable and at pt's baseline.  Consults:  none  Significant Diagnostic Studies:  CXR 09/14/2011   IMPRESSION:  New interstitial and alveolar airspace opacity, most prominent in the right upper lobe.  This may represent pulmonary edema with an asymmetric/atypical component or pneumonia or both.   Dg Chest 2 View 09/12/2011    IMPRESSION:  Questionable nodular density lower left chest, could potentially represent a nipple shadow; recommend upright PA chest radiograph with nipple markers to assess.    Ct Head Wo Contrast 09/12/2011     IMPRESSION:  No acute abnormality.    Dg Ankle Complete Right  09/09/2011   IMPRESSION:  Negative right ankle.   Dg Foot Complete Right  09/09/2011   IMPRESSION:  Osteopenia and first MTP joint osteoarthritis.  No acute osseous abnormality.   Brief H and P: Pt is 78 y/o male with hx of HTN, CVA, vascular dementia, prostate ca , gout and active smoker who presented with history of feeling weak, inability to ambulate. He was in the ER 3 days prior to admission with right foot pain, he was treated for cellulitis and was prescribed Keflex and Bactrim and Ultram for pain. He has taken it for 3 days. Over the course of the last 3 days the patient has had  increasing weakness, generalized tremors, low-grade fever at home. He also has been answering questions inappropriately. He was seen by his primary care provider earlier in the day of the admission, and was sent back to the hospital ED to be evaluated for possibility of stroke.   Physical Exam on Discharge:  Filed Vitals:   09/16/11 0500 09/16/11 1440 09/16/11 2215 09/17/11 0636  BP: 169/94 146/89 164/76 171/84  Pulse: 97 97 76 69  Temp: 98.5 F (36.9 C) 99.8 F (37.7 C) 98.9 F (37.2 C) 99.6 F (37.6 C)  SpO2: 96% 98% 98% 98%    Intake/Output Summary (Last 24 hours) at 09/17/11 0853 Last data filed at 09/17/11 0630  Gross per 24 hour  Intake   2240 ml  Output   1176 ml  Net   1064 ml    General: Alert, awake, oriented x 2 (name and place), in no acute distress. HEENT: No bruits, no goiter. Heart: Regular rate and rhythm, without murmurs, rubs, gallops. Lungs: Clear to auscultation bilaterally with slightly decreased breath sounds at bases Abdomen: Soft, nontender, nondistended, positive bowel sounds. Extremities: No clubbing cyanosis or edema with positive pedal pulses. Neuro: Grossly intact, nonfocal.  Lab Results:  Lab 09/16/11 0955 09/13/11 0526 09/12/11 1915 09/12/11 1329  WBC 8.7 6.7 7.1 7.2  HGB 11.9* 9.9* 10.9* 11.0*  HCT 34.8* 29.9* 32.7* 33.1*  PLT 214 159 188 183   Lab 09/16/11 0955 09/15/11 0510 09/14/11 0530 09/13/11 0526 09/12/11 1329  NA 133* 133* 128* 129* 137  K 3.7 3.6 4.7 5.4* 4.5  CL 101 102 100 98 104  CO2 20 20 19 22 21   GLUCOSE 109* 93 138* 120* 96  BUN 16 19 27* 37* 48*  CREATININE 1.08 1.21 1.28 1.88* 2.74*  CALCIUM 7.7* 8.2* 7.9* 7.7* 8.2*  MG -- -- -- -- 2.3   Cardiac markers:  Lab 09/13/11 0526 09/12/11 2332 09/12/11 1915  CKMB 9.1* 11.1* 12.7*  TROPONINI <0.30 <0.30 <0.30  MYOGLOBIN -- -- --    Urine Culture: 09/12/2011 --> negative Blood Culture: 09/12/5011 --> no growth to date  Hospital Course:  HOSPITAL COURSE:   #1  acute gout - the patient was initially treated with prednisone, he has responded well to the medical therapy and this is now at baseline  and resolved. His allopurinol has been resumed. His uric acid was 12.  #2 acute kidney injury - the setting of rhabdomyolysis with a CK of 3208 upon admission. Could also be prerenal,cr 2.74, cr 1.08 currently and now this is within normal limits. Repeat CK was 555  #3 community-acquired pneumonia - the patient was started on Avelox and has responded well to the therapy,  his pulmonary status has been stable. We will continue with Avelox for another 7 days  #4 disposition advanced dementia - this is progressive in nature and pt will be discharged to SNF for further rehabilitation.  Time spent on Discharge: Over 30 minutes  Signed: Debbora Presto 09/17/2011, 8:53 AM  [facility], pager #: [phone] Main office number: [phone]

## 2011-09-19 LAB — CULTURE, BLOOD (ROUTINE X 2)
Culture  Setup Time: 201305110301
Culture: NO GROWTH

## 2011-12-18 ENCOUNTER — Other Ambulatory Visit: Payer: Self-pay | Admitting: Internal Medicine

## 2012-04-08 ENCOUNTER — Other Ambulatory Visit: Payer: Self-pay | Admitting: Internal Medicine

## 2012-04-14 NOTE — Telephone Encounter (Signed)
Called the pt, and his daughter as requested to make him an appointment before refill can be sent

## 2012-04-24 ENCOUNTER — Other Ambulatory Visit: Payer: Self-pay | Admitting: Internal Medicine

## 2012-07-13 ENCOUNTER — Other Ambulatory Visit: Payer: Self-pay | Admitting: Internal Medicine

## 2012-08-10 ENCOUNTER — Other Ambulatory Visit: Payer: Self-pay | Admitting: Internal Medicine

## 2012-09-12 ENCOUNTER — Other Ambulatory Visit: Payer: Self-pay | Admitting: Internal Medicine

## 2012-10-14 ENCOUNTER — Other Ambulatory Visit: Payer: Medicare Other

## 2012-10-14 ENCOUNTER — Other Ambulatory Visit: Payer: Self-pay | Admitting: Internal Medicine

## 2012-10-14 ENCOUNTER — Ambulatory Visit
Admission: RE | Admit: 2012-10-14 | Discharge: 2012-10-14 | Disposition: A | Discharge status: Home / Self Care | Payer: Medicare Other | Source: Ambulatory Visit | Attending: Internal Medicine | Admitting: Internal Medicine

## 2012-10-14 DIAGNOSIS — R531 Weakness: Secondary | ICD-10-CM

## 2012-10-14 DIAGNOSIS — R410 Disorientation, unspecified: Secondary | ICD-10-CM

## 2012-10-19 ENCOUNTER — Other Ambulatory Visit (HOSPITAL_COMMUNITY): Payer: Self-pay | Admitting: Internal Medicine

## 2012-10-19 ENCOUNTER — Ambulatory Visit (HOSPITAL_COMMUNITY): Payer: Medicare Other | Attending: Internal Medicine | Admitting: Radiology

## 2012-10-19 DIAGNOSIS — R55 Syncope and collapse: Secondary | ICD-10-CM

## 2012-10-19 NOTE — Progress Notes (Signed)
Echocardiogram performed.  

## 2012-10-21 ENCOUNTER — Encounter (INDEPENDENT_AMBULATORY_CARE_PROVIDER_SITE_OTHER): Payer: Medicare Other

## 2012-10-21 DIAGNOSIS — I6529 Occlusion and stenosis of unspecified carotid artery: Secondary | ICD-10-CM

## 2014-02-01 ENCOUNTER — Inpatient Hospital Stay (HOSPITAL_COMMUNITY)
Admission: EM | Admit: 2014-02-01 | Discharge: 2014-02-04 | DRG: 683 | Disposition: A | Discharge status: Home / Self Care | Payer: Medicare Other | Attending: Internal Medicine | Admitting: Internal Medicine

## 2014-02-01 ENCOUNTER — Encounter (HOSPITAL_COMMUNITY): Payer: Self-pay | Admitting: Emergency Medicine

## 2014-02-01 ENCOUNTER — Emergency Department (HOSPITAL_COMMUNITY): Payer: Medicare Other

## 2014-02-01 DIAGNOSIS — N390 Urinary tract infection, site not specified: Secondary | ICD-10-CM | POA: Diagnosis present

## 2014-02-01 DIAGNOSIS — Z87891 Personal history of nicotine dependence: Secondary | ICD-10-CM | POA: Diagnosis not present

## 2014-02-01 DIAGNOSIS — Z7982 Long term (current) use of aspirin: Secondary | ICD-10-CM | POA: Diagnosis not present

## 2014-02-01 DIAGNOSIS — F0390 Unspecified dementia without behavioral disturbance: Secondary | ICD-10-CM | POA: Diagnosis present

## 2014-02-01 DIAGNOSIS — E785 Hyperlipidemia, unspecified: Secondary | ICD-10-CM | POA: Diagnosis present

## 2014-02-01 DIAGNOSIS — K5900 Constipation, unspecified: Secondary | ICD-10-CM | POA: Diagnosis present

## 2014-02-01 DIAGNOSIS — I10 Essential (primary) hypertension: Secondary | ICD-10-CM | POA: Diagnosis present

## 2014-02-01 DIAGNOSIS — I129 Hypertensive chronic kidney disease with stage 1 through stage 4 chronic kidney disease, or unspecified chronic kidney disease: Secondary | ICD-10-CM | POA: Diagnosis present

## 2014-02-01 DIAGNOSIS — D500 Iron deficiency anemia secondary to blood loss (chronic): Secondary | ICD-10-CM | POA: Diagnosis present

## 2014-02-01 DIAGNOSIS — N179 Acute kidney failure, unspecified: Secondary | ICD-10-CM | POA: Diagnosis present

## 2014-02-01 DIAGNOSIS — L03115 Cellulitis of right lower limb: Secondary | ICD-10-CM | POA: Diagnosis present

## 2014-02-01 DIAGNOSIS — N182 Chronic kidney disease, stage 2 (mild): Secondary | ICD-10-CM | POA: Diagnosis present

## 2014-02-01 DIAGNOSIS — R627 Adult failure to thrive: Secondary | ICD-10-CM | POA: Diagnosis present

## 2014-02-01 DIAGNOSIS — R5381 Other malaise: Secondary | ICD-10-CM | POA: Diagnosis present

## 2014-02-01 DIAGNOSIS — Z79899 Other long term (current) drug therapy: Secondary | ICD-10-CM

## 2014-02-01 DIAGNOSIS — R001 Bradycardia, unspecified: Secondary | ICD-10-CM | POA: Diagnosis present

## 2014-02-01 DIAGNOSIS — Z8673 Personal history of transient ischemic attack (TIA), and cerebral infarction without residual deficits: Secondary | ICD-10-CM

## 2014-02-01 DIAGNOSIS — E440 Moderate protein-calorie malnutrition: Secondary | ICD-10-CM | POA: Insufficient documentation

## 2014-02-01 DIAGNOSIS — I498 Other specified cardiac arrhythmias: Secondary | ICD-10-CM

## 2014-02-01 DIAGNOSIS — Z8546 Personal history of malignant neoplasm of prostate: Secondary | ICD-10-CM | POA: Diagnosis not present

## 2014-02-01 HISTORY — DX: Unspecified osteoarthritis, unspecified site: M19.90

## 2014-02-01 LAB — URINE MICROSCOPIC-ADD ON

## 2014-02-01 LAB — CBC WITH DIFFERENTIAL/PLATELET
Basophils Absolute: 0 10*3/uL (ref 0.0–0.1)
Basophils Relative: 0 % (ref 0–1)
Eosinophils Absolute: 0.2 10*3/uL (ref 0.0–0.7)
Eosinophils Relative: 4 % (ref 0–5)
HCT: 33.2 % — ABNORMAL LOW (ref 39.0–52.0)
HEMOGLOBIN: 11.1 g/dL — AB (ref 13.0–17.0)
LYMPHS ABS: 2 10*3/uL (ref 0.7–4.0)
Lymphocytes Relative: 37 % (ref 12–46)
MCH: 27 pg (ref 26.0–34.0)
MCHC: 33.4 g/dL (ref 30.0–36.0)
MCV: 80.8 fL (ref 78.0–100.0)
MONOS PCT: 6 % (ref 3–12)
Monocytes Absolute: 0.3 10*3/uL (ref 0.1–1.0)
NEUTROS ABS: 2.8 10*3/uL (ref 1.7–7.7)
NEUTROS PCT: 53 % (ref 43–77)
Platelets: 188 10*3/uL (ref 150–400)
RBC: 4.11 MIL/uL — AB (ref 4.22–5.81)
RDW: 17.6 % — ABNORMAL HIGH (ref 11.5–15.5)
WBC: 5.5 10*3/uL (ref 4.0–10.5)

## 2014-02-01 LAB — URINALYSIS, ROUTINE W REFLEX MICROSCOPIC
BILIRUBIN URINE: NEGATIVE
Glucose, UA: NEGATIVE mg/dL
KETONES UR: NEGATIVE mg/dL
Nitrite: NEGATIVE
Protein, ur: 30 mg/dL — AB
Specific Gravity, Urine: 1.017 (ref 1.005–1.030)
UROBILINOGEN UA: 1 mg/dL (ref 0.0–1.0)
pH: 7 (ref 5.0–8.0)

## 2014-02-01 LAB — APTT: aPTT: 34 seconds (ref 24–37)

## 2014-02-01 LAB — VALPROIC ACID LEVEL: Valproic Acid Lvl: 38.9 ug/mL — ABNORMAL LOW (ref 50.0–100.0)

## 2014-02-01 LAB — BASIC METABOLIC PANEL
Anion gap: 15 (ref 5–15)
BUN: 38 mg/dL — AB (ref 6–23)
CHLORIDE: 103 meq/L (ref 96–112)
CO2: 20 mEq/L (ref 19–32)
Calcium: 8.8 mg/dL (ref 8.4–10.5)
Creatinine, Ser: 1.8 mg/dL — ABNORMAL HIGH (ref 0.50–1.35)
GFR calc Af Amer: 39 mL/min — ABNORMAL LOW (ref >90)
GFR calc non Af Amer: 33 mL/min — ABNORMAL LOW (ref >90)
GLUCOSE: 130 mg/dL — AB (ref 70–99)
POTASSIUM: 4.3 meq/L (ref 3.7–5.3)
Sodium: 138 mEq/L (ref 137–147)

## 2014-02-01 LAB — RAPID URINE DRUG SCREEN, HOSP PERFORMED
Amphetamines: NOT DETECTED
Barbiturates: NOT DETECTED
Benzodiazepines: NOT DETECTED
Cocaine: NOT DETECTED
Opiates: NOT DETECTED
Tetrahydrocannabinol: NOT DETECTED

## 2014-02-01 LAB — I-STAT CHEM 8, ED
BUN: 38 mg/dL — ABNORMAL HIGH (ref 6–23)
Calcium, Ion: 1.13 mmol/L (ref 1.13–1.30)
Chloride: 107 mEq/L (ref 96–112)
Creatinine, Ser: 1.9 mg/dL — ABNORMAL HIGH (ref 0.50–1.35)
Glucose, Bld: 127 mg/dL — ABNORMAL HIGH (ref 70–99)
HEMATOCRIT: 35 % — AB (ref 39.0–52.0)
HEMOGLOBIN: 11.9 g/dL — AB (ref 13.0–17.0)
Potassium: 4.1 mEq/L (ref 3.7–5.3)
SODIUM: 138 meq/L (ref 137–147)
TCO2: 22 mmol/L (ref 0–100)

## 2014-02-01 LAB — PROTIME-INR
INR: 1.18 (ref 0.00–1.49)
Prothrombin Time: 15 seconds (ref 11.6–15.2)

## 2014-02-01 LAB — ETHANOL

## 2014-02-01 LAB — I-STAT TROPONIN, ED: TROPONIN I, POC: 0.01 ng/mL (ref 0.00–0.08)

## 2014-02-01 MED ORDER — DEXTROSE 5 % IV SOLN
1.0000 g | Freq: Once | INTRAVENOUS | Status: AC
  Administered 2014-02-01: 1 g via INTRAVENOUS
  Filled 2014-02-01: qty 10

## 2014-02-01 MED ORDER — SODIUM CHLORIDE 0.9 % IV BOLUS (SEPSIS)
1000.0000 mL | Freq: Once | INTRAVENOUS | Status: AC
  Administered 2014-02-01: 1000 mL via INTRAVENOUS

## 2014-02-01 NOTE — ED Provider Notes (Signed)
Patient presents to the emergency room because of weakness. The family states over the last day the patient has had increasing weakness. He had difficulty with standing. The patient himself denies any pain.  He states he is feeling weak in the knees when he tries to stand Physical Exam  BP 128/57  Pulse 70  Temp(Src) 98.3 F (36.8 C) (Oral)  Resp 18  SpO2 94%  Physical Exam  Nursing note and vitals reviewed. Constitutional: He appears well-developed and well-nourished. No distress.  HENT:  Head: Normocephalic and atraumatic.  Right Ear: External ear normal.  Left Ear: External ear normal.  Eyes: Conjunctivae are normal. Right eye exhibits no discharge. Left eye exhibits no discharge. No scleral icterus.  Neck: Neck supple. No tracheal deviation present.  Cardiovascular: Normal rate.   Pulmonary/Chest: Effort normal. No stridor. No respiratory distress.  Musculoskeletal: He exhibits no edema.  Neurological: He is alert. Cranial nerve deficit: no gross deficits.  The patient attempted to stand with assistance. He was very tremulous. He was unable to stand at the bedside  Skin: Skin is warm and dry. No rash noted.  Psychiatric: He has a normal mood and affect.  EKG Interpretation  Date/Time:  Wednesday February 01 2014 20:12:08 EDT Ventricular Rate:  57 PR Interval:  193 QRS Duration: 85 QT Interval:  419 QTC Calculation: 408 R Axis:   28 Text Interpretation:  Sinus rhythm Atrial premature complex Baseline wander in lead(s) V2 No significant change since last tracing Confirmed by Ralphael Southgate  MD-J, Taesha Goodell ([id number]) on 02/01/2014 8:15:34 PM   ED Course  Procedures  MDM Patient describes generalized weakness and questionable ataxia. We'll plan on checking labs and CT scan.      Dorie Rank, MD 02/01/14 2018

## 2014-02-01 NOTE — ED Provider Notes (Signed)
CSN: [id number]     Arrival date & time 02/01/14  1844 History   First MD Initiated Contact with Patient 02/01/14 1850     Chief Complaint  Patient presents with  . Arthritis     (Consider location/radiation/quality/duration/timing/severity/associated sxs/prior Treatment) HPI Comments: Patient with a history of Dementia, CVA, or Prostate Cancer brought in today by family members due to difficulty standing.  Patient reports that he was having difficulty standing due to feeling off balance.  Symptoms began earlier today.  Family report that the patient is able to ambulate with a walker at baseline.  He reports that he is having bilateral knee pain, but does not feel that his difficulty standing was due to pain.  Knee pain has been present for years.  He is not currently taking anything for pain.  He denies fever, chills, chest pain, SOB, nausea, or vomiting.  Denies any new weakness, numbness, tingling, or vision changes.    The history is provided by the patient.    Past Medical History  Diagnosis Date  . Hypertension   . CVA (cerebral vascular accident)   . Dementia   . Prostate cancer   . Cellulitis currently    right foot  . Arthritis    Past Surgical History  Procedure Laterality Date  . Neck surgery    . Back surgery    . Spider bite surgery      on left leg  . Prostate seed implants     Family History  Problem Relation Age of Onset  . Hypotension Mother   . Hypotension Father    History  Substance Use Topics  . Smoking status: Former Smoker -- 15 years  . Smokeless tobacco: Never Used  . Alcohol Use: 0.6 oz/week    1 Glasses of wine per week     Comment: none in 2 years or more    Review of Systems  All other systems reviewed and are negative.     Allergies  Review of patient's allergies indicates no known allergies.  Home Medications   Prior to Admission medications   Medication Sig Start Date End Date Taking? Authorizing Provider   albuterol-ipratropium (COMBIVENT) 18-103 MCG/ACT inhaler Inhale 2 puffs into the lungs every 6 (six) hours as needed for wheezing. 09/16/11 09/15/12  Reyne Dumas, MD  allopurinol (ZYLOPRIM) 100 MG tablet Take 100 mg by mouth daily.    Historical Provider, MD  amLODipine (NORVASC) 5 MG tablet Take 5 mg by mouth daily.    Historical Provider, MD  aspirin EC 81 MG tablet Take 81 mg by mouth daily.    Historical Provider, MD  colchicine 0.6 MG tablet Take 1 tablet (0.6 mg total) by mouth 2 (two) times daily. 09/16/11 09/23/11  Reyne Dumas, MD  divalproex (DEPAKOTE) 250 MG DR tablet Take 250 mg by mouth 2 (two) times daily. Take one tablet in the am and 2 tablets in the pm    Historical Provider, MD  donepezil (ARICEPT) 5 MG tablet Take 5 mg by mouth daily.    Historical Provider, MD  gabapentin (NEURONTIN) 600 MG tablet Take 0.5 tablets (300 mg total) by mouth 2 (two) times daily. 09/16/11   Reyne Dumas, MD  hydrochlorothiazide (HYDRODIURIL) 25 MG tablet Take 12.5 mg by mouth daily. Take 1/2 tablet by mouth once daily    Historical Provider, MD  multivitamin [facility]) per tablet Take 1 tablet by mouth daily.    Historical Provider, MD  oxybutynin (DITROPAN-XL) 10 MG 24 hr tablet  Take 10 mg by mouth 3 (three) times daily.    Historical Provider, MD  pravastatin (PRAVACHOL) 40 MG tablet TAKE ONE TABLET BY MOUTH EVERY DAY (  NEEDS  OFFICE  VISIT  FOR  MORE  REFILLS) 07/13/12   Fay Records, MD   BP 128/57  Pulse 70  Temp(Src) 98.7 F (37.1 C) (Oral)  Resp 18  SpO2 94% Physical Exam  Nursing note and vitals reviewed. Constitutional: He appears well-developed and well-nourished.  HENT:  Head: Normocephalic and atraumatic.  Mouth/Throat: Oropharynx is clear and moist.  Eyes: EOM are normal. Pupils are equal, round, and reactive to light.  Neck: Normal range of motion. Neck supple.  Cardiovascular: Normal rate, regular rhythm and normal heart sounds.   Pulmonary/Chest: Effort normal and breath  sounds normal.  Musculoskeletal: Normal range of motion.       Right knee: He exhibits normal range of motion, no effusion and no erythema.       Left knee: He exhibits normal range of motion, no effusion and no erythema.  No erythema, edema, or warmth of the knees bilaterally.  Neurological: He is alert. No cranial nerve deficit or sensory deficit.  Muscle strength 5/5 RLE Muscle strength 4/5 LLE, which patient reports is baseline. Grip strength 5/5 bilaterally  Skin: Skin is warm and dry. No erythema.  Psychiatric: He has a normal mood and affect.    ED Course  Procedures (including critical care time) Labs Review Labs Reviewed - No data to display  Imaging Review Ct Head Wo Contrast  02/01/2014   CLINICAL DATA:  Vertigo, gait disturbance  EXAM: CT HEAD WITHOUT CONTRAST  TECHNIQUE: Contiguous axial images were obtained from the base of the skull through the vertex without intravenous contrast.  COMPARISON:  10/14/2012  FINDINGS: No evidence of parenchymal hemorrhage or extra-axial fluid collection. No mass lesion, mass effect, or midline shift.  No CT evidence of acute infarction.  Right basal ganglia lacunar infarct.  Subcortical white matter and periventricular small vessel ischemic changes. Intracranial atherosclerosis.  Age related atrophy.  No ventriculomegaly.  The visualized paranasal sinuses are essentially clear. The mastoid air cells are unopacified.  No evidence of calvarial fracture.  IMPRESSION: No evidence of acute intracranial abnormality.  Old right basal ganglia lacunar infarct.  Atrophy with small vessel ischemic changes and intracranial atherosclerosis.   Electronically Signed   By: Julian Hy M.D.   On: 02/01/2014 21:41     EKG Interpretation   Date/Time:  Wednesday February 01 2014 20:12:08 EDT Ventricular Rate:  57 PR Interval:  193 QRS Duration: 85 QT Interval:  419 QTC Calculation: 408 R Axis:   28 Text Interpretation:  Sinus rhythm Atrial premature  complex Baseline  wander in lead(s) V2 No significant change since last tracing Confirmed by  KNAPP  MD-J, JON ([id number]) on 02/01/2014 8:15:34 PM      MDM   Final diagnoses:  None   Patient presents today with a chief complaint of feeling like he is off balance and weakness.  He is unable to ambulate at this time.  He is normally able to ambulate with a walker at baseline.  No focal neurological deficits on exam.  Labs unremarkable.  No acute findings on Head CT.  UA showing a UTI.  Patient given Rocephin IV in the ED and urine sent for culture.  Patient admitted to [facility] for further management of UTI and weakness.    Hyman Bible, PA-C 02/02/14 [phone]

## 2014-02-01 NOTE — ED Notes (Signed)
Bed: XJ88 Expected date:  Expected time:  Means of arrival:  Comments: EMS 80 yo male bilateral arthritic knee pain/chronic-increased difficulty walking

## 2014-02-01 NOTE — H&P (Signed)
PCP:  Merrilee Seashore, MD  [facility]  Chief Complaint:  Fatigue, could not get up  HPI: Manuel King is a 80 y.o. male   has a past medical history of Hypertension; CVA (cerebral vascular accident); Dementia; Prostate cancer; Cellulitis (currently); and Arthritis.   Presented with  Patient was noted to be unstable on his feet he had hard time getting. Patient has dementia but able to walk with a walker at baseline and eat his meal. Family noted that he appeared shaky. Denies any fever. He is able to get dressed up at baseline. Today he haven't had much PO intake. Patient unable to provide hx family is at bedside. In ER he was found to have UTI. He has been coughing some. Family feels that the urine has been smelling strong patient is incontinent. He was noted to be bradycardic and has been in the past.   Hospitalist was called for admission for  UTI debility  Review of Systems:    Pertinent positives include:  fatigue constipation  Constitutional:  No weight loss, night sweats, Fevers, chills, weight loss  HEENT:  No headaches, Difficulty swallowing,Tooth/dental problems,Sore throat,  No sneezing, itching, ear ache, nasal congestion, post nasal drip,  Cardio-vascular:  No chest pain, Orthopnea, PND, anasarca, dizziness, palpitations.no Bilateral lower extremity swelling  GI:  No heartburn, indigestion, abdominal pain, nausea, vomiting, diarrhea, change in bowel habits, loss of appetite, melena, blood in stool, hematemesis Resp:  no shortness of breath at rest. No dyspnea on exertion, No excess mucus, no productive cough, No non-productive cough, No coughing up of blood.No change in color of mucus.No wheezing. Skin:  no rash or lesions. No jaundice GU:  no dysuria, change in color of urine, no urgency or frequency. No straining to urinate.  No flank pain.  Musculoskeletal:  No joint pain or no joint swelling. No decreased range of motion. No back pain.    Psych:  No change in mood or affect. No depression or anxiety. No memory loss.  Neuro: no localizing neurological complaints, no tingling, no weakness, no double vision, no gait abnormality, no slurred speech, no confusion  Otherwise ROS are negative except for above, 10 systems were reviewed  Past Medical History: Past Medical History  Diagnosis Date  . Hypertension   . CVA (cerebral vascular accident)   . Dementia   . Prostate cancer   . Cellulitis currently    right foot  . Arthritis    Past Surgical History  Procedure Laterality Date  . Neck surgery    . Back surgery    . Spider bite surgery      on left leg  . Prostate seed implants       Medications: Prior to Admission medications   Medication Sig Start Date End Date Taking? Authorizing Provider  allopurinol (ZYLOPRIM) 100 MG tablet Take 100 mg by mouth daily.   Yes Historical Provider, MD  amLODipine (NORVASC) 5 MG tablet Take 5 mg by mouth daily.   Yes Historical Provider, MD  aspirin EC 81 MG tablet Take 81 mg by mouth daily.   Yes Historical Provider, MD  divalproex (DEPAKOTE) 250 MG DR tablet Take 250 mg by mouth See admin instructions. Take one tablet in the am and 2 tablets in the pm   Yes Historical Provider, MD  donepezil (ARICEPT) 10 MG tablet Take 10 mg by mouth at bedtime.   Yes Historical Provider, MD  gabapentin (NEURONTIN) 800 MG tablet Take 800 mg by mouth 3 (  three) times daily.   Yes Historical Provider, MD  hydrochlorothiazide (MICROZIDE) 12.5 MG capsule Take 12.5 mg by mouth daily.   Yes Historical Provider, MD  irbesartan (AVAPRO) 150 MG tablet Take 150 mg by mouth daily.   Yes Historical Provider, MD  oxybutynin (DITROPAN) 5 MG tablet Take 5 mg by mouth 3 (three) times daily.   Yes Historical Provider, MD  pravastatin (PRAVACHOL) 40 MG tablet Take 40 mg by mouth daily.   Yes Historical Provider, MD    Allergies:  No Known Allergies  Social History:  Ambulatory  Walker  Lives at home alone       reports that he has quit smoking. He has never used smokeless tobacco. He reports that he does not drink alcohol or use illicit drugs.    Family History: family history includes CAD in his father, mother, and sister; Diabetes type II in his mother; Hypotension in his father and mother.    Physical Exam: Patient Vitals for the past 24 hrs:  BP Temp Temp src Pulse Resp SpO2  02/01/14 2255 147/58 mmHg - - 47 13 95 %  02/01/14 2200 - - - 51 33 96 %  02/01/14 2109 139/54 mmHg 98.1 F (36.7 C) Oral 57 20 93 %  02/01/14 2015 - - - 57 19 93 %  02/01/14 2005 - 98.3 F (36.8 C) - - - -  02/01/14 1856 128/57 mmHg 98.7 F (37.1 C) Oral 70 18 94 %    1. General:  in No Acute distress 2. Psychological: Alert and  Oriented to place, time, people and self but not situation.  3. Head/ENT:     Dry Mucous Membranes                          Head Non traumatic, neck supple                           Poor Dentition 4. SKIN:  decreased Skin turgor,  Skin clean Dry and intact no rash 5. Heart: Regular rate and rhythm no Murmur, Rub or gallop 6. Lungs:   no wheezes but occasional crackles   7. Abdomen: Soft, non-tender, slightly distended 8. Lower extremities: no clubbing, cyanosis, or edema 9. Neurologically diminished strength on the left, chronic since the CVA 10. MSK: Normal range of motion  body mass index is unknown because there is no weight on file.   Labs on Admission:   Recent Labs  02/01/14 1938 02/01/14 2012  NA 138 138  K 4.3 4.1  CL 103 107  CO2 20  --   GLUCOSE 130* 127*  BUN 38* 38*  CREATININE 1.80* 1.90*  CALCIUM 8.8  --    No results found for this basename: AST, ALT, ALKPHOS, BILITOT, PROT, ALBUMIN,  in the last 72 hours No results found for this basename: LIPASE, AMYLASE,  in the last 72 hours  Recent Labs  02/01/14 1938 02/01/14 2012  WBC 5.5  --   NEUTROABS 2.8  --   HGB 11.1* 11.9*  HCT 33.2* 35.0*  MCV 80.8  --   PLT 188  --    No results found  for this basename: CKTOTAL, CKMB, CKMBINDEX, TROPONINI,  in the last 72 hours No results found for this basename: TSH, T4TOTAL, FREET3, T3FREE, THYROIDAB,  in the last 72 hours No results found for this basename: VITAMINB12, FOLATE, FERRITIN, TIBC, IRON, RETICCTPCT,  in the  last 72 hours No results found for this basename: HGBA1C    The CrCl is unknown because both a height and weight (above a minimum accepted value) are required for this calculation. ABG    Component Value Date/Time   TCO2 22 02/01/2014 2012     No results found for this basename: DDIMER     Other results:  I have pearsonaly reviewed this: ECG REPORT  Rate: 57  Rhythm: SR with PAC ST&T Change: no ischemia   UA evidence of UTI  BNP (last 3 results) No results found for this basename: PROBNP,  in the last 8760 hours  There were no vitals filed for this visit.   Cultures:    Component Value Date/Time   SDES BLOOD RIGHT ARM 09/12/2011 1915   SPECREQUEST BOTTLES DRAWN AEROBIC ONLY 09/12/2011 1915   CULT NO GROWTH 5 DAYS 09/12/2011 1915   REPTSTATUS 09/19/2011 FINAL 09/12/2011 1915      Radiological Exams on Admission: Ct Head Wo Contrast  02/01/2014   CLINICAL DATA:  Vertigo, gait disturbance  EXAM: CT HEAD WITHOUT CONTRAST  TECHNIQUE: Contiguous axial images were obtained from the base of the skull through the vertex without intravenous contrast.  COMPARISON:  10/14/2012  FINDINGS: No evidence of parenchymal hemorrhage or extra-axial fluid collection. No mass lesion, mass effect, or midline shift.  No CT evidence of acute infarction.  Right basal ganglia lacunar infarct.  Subcortical white matter and periventricular small vessel ischemic changes. Intracranial atherosclerosis.  Age related atrophy.  No ventriculomegaly.  The visualized paranasal sinuses are essentially clear. The mastoid air cells are unopacified.  No evidence of calvarial fracture.  IMPRESSION: No evidence of acute intracranial abnormality.  Old  right basal ganglia lacunar infarct.  Atrophy with small vessel ischemic changes and intracranial atherosclerosis.   Electronically Signed   By: Julian Hy M.D.   On: 02/01/2014 21:41    Chart has been reviewed  Assessment/Plan  80 year old gentleman with past history of stroke and dementia he presents with increased fatigue was found to have urinary tract infection acute renal failure and intermittent bradycardia.  Present on Admission:  . UTI (lower urinary tract infection)  - treat with Rocephin await results of urine culture  . Essential hypertension- continue home medications hold hydrochlorothiazide  . Bradycardia - family states he has had this problem in the past. Will monitor on telemetry check TSH and echo  . Acute renal failurelikely secondary to poor by mouth in take unsure what his current baseline is last creatinine in the system from 2013. Will hold ARB, obtain urine electrolytes check orthostatics  . Constipation - Bowel regimen as needed   debility will have PT OT evaluation prior to discharge for safe discharge to home   Prophylaxis:  Lovenox, Protonix  CODE STATUS:  FULL CODE    Other plan as per orders.  I have spent a total of  55 min on this admission  Jaret Coppedge 02/01/2014, 11:19 PM  [facility]  Pager [phone]   after 2 AM please page floor coverage PA If 7AM-7PM, please contact the day team taking care of the patient  [email or web address]  Password [email or web address]

## 2014-02-01 NOTE — ED Notes (Addendum)
Per EMS-c/o bilateral arthritic knee pain. Reports feeling shaky when he walks due to pain. He hasn't been up and moving around as well reported by family. Family was about to leave home and did not want him to be home alone. Patient denies any new symptoms. EMS reports patient was ambulatory at scene with walker. VS: BP 160/70 HR 80 RR 16 SpO2 96%. No other complaints/concerns.

## 2014-02-02 ENCOUNTER — Observation Stay (HOSPITAL_COMMUNITY): Payer: Medicare Other

## 2014-02-02 DIAGNOSIS — I369 Nonrheumatic tricuspid valve disorder, unspecified: Secondary | ICD-10-CM

## 2014-02-02 LAB — TROPONIN I
Troponin I: <0.3 ng/mL (ref <0.30)
Troponin I: <0.3 ng/mL (ref <0.30)

## 2014-02-02 LAB — COMPREHENSIVE METABOLIC PANEL
AST: 8 U/L (ref 0–37)
Albumin: 2.7 g/dL — ABNORMAL LOW (ref 3.5–5.2)
Alkaline Phosphatase: 66 U/L (ref 39–117)
Anion gap: 14 (ref 5–15)
BUN: 34 mg/dL — ABNORMAL HIGH (ref 6–23)
CHLORIDE: 108 meq/L (ref 96–112)
CO2: 21 meq/L (ref 19–32)
Calcium: 8.4 mg/dL (ref 8.4–10.5)
Creatinine, Ser: 1.61 mg/dL — ABNORMAL HIGH (ref 0.50–1.35)
GFR calc Af Amer: 44 mL/min — ABNORMAL LOW (ref >90)
GFR calc non Af Amer: 38 mL/min — ABNORMAL LOW (ref >90)
Glucose, Bld: 100 mg/dL — ABNORMAL HIGH (ref 70–99)
Potassium: 4 mEq/L (ref 3.7–5.3)
Sodium: 143 mEq/L (ref 137–147)
Total Bilirubin: 0.3 mg/dL (ref 0.3–1.2)
Total Protein: 6.8 g/dL (ref 6.0–8.3)

## 2014-02-02 LAB — TSH: TSH: 8.28 u[IU]/mL — AB (ref 0.350–4.500)

## 2014-02-02 LAB — CBC
HCT: 32.3 % — ABNORMAL LOW (ref 39.0–52.0)
HEMATOCRIT: 35 % — AB (ref 39.0–52.0)
Hemoglobin: 10.5 g/dL — ABNORMAL LOW (ref 13.0–17.0)
Hemoglobin: 10.9 g/dL — ABNORMAL LOW (ref 13.0–17.0)
MCH: 25.8 pg — ABNORMAL LOW (ref 26.0–34.0)
MCH: 26.3 pg (ref 26.0–34.0)
MCHC: 31.1 g/dL (ref 30.0–36.0)
MCHC: 32.5 g/dL (ref 30.0–36.0)
MCV: 80.8 fL (ref 78.0–100.0)
MCV: 82.7 fL (ref 78.0–100.0)
PLATELETS: 177 10*3/uL (ref 150–400)
Platelets: 173 10*3/uL (ref 150–400)
RBC: 4 MIL/uL — ABNORMAL LOW (ref 4.22–5.81)
RBC: 4.23 MIL/uL (ref 4.22–5.81)
RDW: 17.5 % — AB (ref 11.5–15.5)
RDW: 17.6 % — AB (ref 11.5–15.5)
WBC: 5.1 10*3/uL (ref 4.0–10.5)
WBC: 6.1 10*3/uL (ref 4.0–10.5)

## 2014-02-02 LAB — PHOSPHORUS: Phosphorus: 4.6 mg/dL (ref 2.3–4.6)

## 2014-02-02 LAB — HEMOGLOBIN A1C
Hgb A1c MFr Bld: 6.3 % — ABNORMAL HIGH (ref <5.7)
MEAN PLASMA GLUCOSE: 134 mg/dL — AB (ref <117)

## 2014-02-02 LAB — CREATININE, SERUM
CREATININE: 1.68 mg/dL — AB (ref 0.50–1.35)
GFR calc Af Amer: 42 mL/min — ABNORMAL LOW (ref >90)
GFR calc non Af Amer: 36 mL/min — ABNORMAL LOW (ref >90)

## 2014-02-02 LAB — CREATININE, URINE, RANDOM: CREATININE, URINE: 131.4 mg/dL

## 2014-02-02 LAB — MAGNESIUM: Magnesium: 2.1 mg/dL (ref 1.5–2.5)

## 2014-02-02 LAB — SODIUM, URINE, RANDOM: SODIUM UR: 107 meq/L

## 2014-02-02 MED ORDER — SODIUM CHLORIDE 0.9 % IJ SOLN
3.0000 mL | Freq: Two times a day (BID) | INTRAMUSCULAR | Status: DC
  Administered 2014-02-03 – 2014-02-04 (×3): 3 mL via INTRAVENOUS

## 2014-02-02 MED ORDER — ASPIRIN EC 81 MG PO TBEC
81.0000 mg | DELAYED_RELEASE_TABLET | Freq: Every day | ORAL | Status: DC
Start: 2014-02-02 — End: 2014-02-04
  Administered 2014-02-02 – 2014-02-04 (×3): 81 mg via ORAL
  Filled 2014-02-02 (×3): qty 1

## 2014-02-02 MED ORDER — ZOLPIDEM TARTRATE 5 MG PO TABS
5.0000 mg | ORAL_TABLET | Freq: Once | ORAL | Status: AC
  Administered 2014-02-02: 5 mg via ORAL
  Filled 2014-02-02: qty 1

## 2014-02-02 MED ORDER — HYDRALAZINE HCL 20 MG/ML IJ SOLN
10.0000 mg | Freq: Four times a day (QID) | INTRAMUSCULAR | Status: DC | PRN
  Filled 2014-02-02: qty 1

## 2014-02-02 MED ORDER — ENOXAPARIN SODIUM 30 MG/0.3ML ~~LOC~~ SOLN
30.0000 mg | SUBCUTANEOUS | Status: DC
  Administered 2014-02-02: 30 mg via SUBCUTANEOUS
  Filled 2014-02-02: qty 0.3

## 2014-02-02 MED ORDER — DIVALPROEX SODIUM 250 MG PO DR TAB
250.0000 mg | DELAYED_RELEASE_TABLET | Freq: Every day | ORAL | Status: DC
  Administered 2014-02-02 – 2014-02-04 (×3): 250 mg via ORAL
  Filled 2014-02-02 (×3): qty 1

## 2014-02-02 MED ORDER — ALLOPURINOL 100 MG PO TABS
100.0000 mg | ORAL_TABLET | Freq: Every day | ORAL | Status: DC
  Administered 2014-02-02 – 2014-02-04 (×3): 100 mg via ORAL
  Filled 2014-02-02 (×3): qty 1

## 2014-02-02 MED ORDER — SODIUM CHLORIDE 0.9 % IV SOLN
INTRAVENOUS | Status: AC
  Administered 2014-02-02: via INTRAVENOUS

## 2014-02-02 MED ORDER — FLEET ENEMA 7-19 GM/118ML RE ENEM: 1.0000 | ENEMA | Freq: Once | RECTAL | Status: AC | PRN

## 2014-02-02 MED ORDER — ENOXAPARIN SODIUM 40 MG/0.4ML ~~LOC~~ SOLN
40.0000 mg | SUBCUTANEOUS | Status: DC
  Administered 2014-02-03 – 2014-02-04 (×2): 40 mg via SUBCUTANEOUS
  Filled 2014-02-02 (×2): qty 0.4

## 2014-02-02 MED ORDER — SENNA 8.6 MG PO TABS
1.0000 | ORAL_TABLET | Freq: Two times a day (BID) | ORAL | Status: DC
  Administered 2014-02-02 – 2014-02-04 (×6): 8.6 mg via ORAL
  Filled 2014-02-02 (×5): qty 1

## 2014-02-02 MED ORDER — ONDANSETRON HCL 4 MG/2ML IJ SOLN: 4.0000 mg | Freq: Four times a day (QID) | INTRAMUSCULAR | Status: DC | PRN

## 2014-02-02 MED ORDER — SIMVASTATIN 20 MG PO TABS
20.0000 mg | ORAL_TABLET | Freq: Every day | ORAL | Status: DC
  Administered 2014-02-02 – 2014-02-03 (×2): 20 mg via ORAL
  Filled 2014-02-02 (×4): qty 1

## 2014-02-02 MED ORDER — DEXTROSE 5 % IV SOLN
1.0000 g | INTRAVENOUS | Status: DC
  Administered 2014-02-02 – 2014-02-03 (×2): 1 g via INTRAVENOUS
  Filled 2014-02-02 (×2): qty 10

## 2014-02-02 MED ORDER — OXYBUTYNIN CHLORIDE 5 MG PO TABS
5.0000 mg | ORAL_TABLET | Freq: Three times a day (TID) | ORAL | Status: DC
  Administered 2014-02-02 – 2014-02-04 (×7): 5 mg via ORAL
  Filled 2014-02-02 (×9): qty 1

## 2014-02-02 MED ORDER — HYDRALAZINE HCL 10 MG PO TABS
10.0000 mg | ORAL_TABLET | Freq: Three times a day (TID) | ORAL | Status: DC
  Administered 2014-02-02 – 2014-02-04 (×6): 10 mg via ORAL
  Filled 2014-02-02 (×9): qty 1

## 2014-02-02 MED ORDER — DONEPEZIL HCL 10 MG PO TABS
10.0000 mg | ORAL_TABLET | Freq: Every day | ORAL | Status: DC
  Administered 2014-02-02 – 2014-02-03 (×3): 10 mg via ORAL
  Filled 2014-02-02 (×4): qty 1

## 2014-02-02 MED ORDER — POLYETHYLENE GLYCOL 3350 17 G PO PACK
17.0000 g | PACK | Freq: Every day | ORAL | Status: DC | PRN
  Filled 2014-02-02: qty 1

## 2014-02-02 MED ORDER — DIVALPROEX SODIUM 500 MG PO DR TAB
500.0000 mg | DELAYED_RELEASE_TABLET | Freq: Every day | ORAL | Status: DC
  Administered 2014-02-02 – 2014-02-03 (×2): 500 mg via ORAL
  Filled 2014-02-02 (×3): qty 1

## 2014-02-02 MED ORDER — AMLODIPINE BESYLATE 5 MG PO TABS
5.0000 mg | ORAL_TABLET | Freq: Every day | ORAL | Status: DC
  Administered 2014-02-02 – 2014-02-04 (×3): 5 mg via ORAL
  Filled 2014-02-02 (×3): qty 1

## 2014-02-02 MED ORDER — ACETAMINOPHEN 650 MG RE SUPP: 650.0000 mg | Freq: Four times a day (QID) | RECTAL | Status: DC | PRN

## 2014-02-02 MED ORDER — ONDANSETRON HCL 4 MG PO TABS: 4.0000 mg | ORAL_TABLET | Freq: Four times a day (QID) | ORAL | Status: DC | PRN

## 2014-02-02 MED ORDER — GABAPENTIN 400 MG PO CAPS
800.0000 mg | ORAL_CAPSULE | Freq: Three times a day (TID) | ORAL | Status: DC
  Administered 2014-02-02 – 2014-02-04 (×7): 800 mg via ORAL
  Filled 2014-02-02 (×9): qty 2

## 2014-02-02 MED ORDER — BISACODYL 10 MG RE SUPP: 10.0000 mg | Freq: Every day | RECTAL | Status: DC | PRN

## 2014-02-02 MED ORDER — DOCUSATE SODIUM 100 MG PO CAPS
100.0000 mg | ORAL_CAPSULE | Freq: Two times a day (BID) | ORAL | Status: DC
Start: 2014-02-02 — End: 2014-02-04
  Administered 2014-02-02 – 2014-02-04 (×5): 100 mg via ORAL
  Filled 2014-02-02 (×6): qty 1

## 2014-02-02 MED ORDER — HYDROCODONE-ACETAMINOPHEN 5-325 MG PO TABS: 1.0000 | ORAL_TABLET | ORAL | Status: DC | PRN

## 2014-02-02 MED ORDER — ACETAMINOPHEN 325 MG PO TABS
650.0000 mg | ORAL_TABLET | Freq: Four times a day (QID) | ORAL | Status: DC | PRN
  Administered 2014-02-02 (×2): 650 mg via ORAL
  Filled 2014-02-02 (×2): qty 2

## 2014-02-02 NOTE — Evaluation (Signed)
Occupational Therapy Evaluation Patient Details Name: Manuel King MRN: [id number] DOB: 02-Jun-1929 Today's Date: 02/02/2014    History of Present Illness Manuel King is a 80 y.o. male  who presents to hospital with weakness and UTI   Clinical Impression   Patient is s/p admission to [facility] with UTI resulting in functional limitations due to the deficits listed below (see OT Problem List). Patient will benefit from skilled OT to increase their safety and independence with ADL and functional mobility for ADL to allow facilitate discharge to venue listed below.    Follow Up Recommendations  Home health OT;SNF;Supervision/Assistance - 24 hour;Other (comment) (depending on family A)    Equipment Recommendations  None recommended by OT       Precautions / Restrictions Precautions Precautions: Fall Restrictions Weight Bearing Restrictions: No      Mobility Bed Mobility Overal bed mobility: Needs Assistance Bed Mobility: Supine to Sit     Supine to sit: Min assist        Transfers Overall transfer level: Needs assistance Equipment used: 2 person hand held assist Transfers: Sit to/from Stand;Stand Pivot Transfers Sit to Stand: +2 safety/equipment;Mod assist Stand pivot transfers: +2 safety/equipment;Mod assist       General transfer comment: verbal cues needed to stand up right.           ADL Overall ADL's : Needs assistance/impaired Eating/Feeding: Set up;Sitting   Grooming: Set up;Sitting   Upper Body Bathing: Minimal assitance;Sitting   Lower Body Bathing: Sit to/from stand;Maximal assistance       Lower Body Dressing: Sit to/from stand;Maximal assistance   Toilet Transfer: Moderate assistance Toilet Transfer Details (indicate cue type and reason): bed to chair Toileting- Clothing Manipulation and Hygiene: Sit to/from stand;Maximal assistance       Functional mobility during ADLs: Moderate assistance General ADL Comments: Explained pt will need A upon  DC. He states family can provide.  If familly cant- pt will need SNF               Pertinent Vitals/Pain Pain Assessment: No/denies pain        Extremity/Trunk Assessment Upper Extremity Assessment Upper Extremity Assessment: Generalized weakness           Communication Communication Communication: No difficulties   Cognition Arousal/Alertness: Awake/alert Behavior During Therapy: WFL for tasks assessed/performed Overall Cognitive Status: Within Functional Limits for tasks assessed                     General Comments    pt weak and will need A at home! No family present for eval.            Home Living Family/patient expects to be discharged to:: Private residence Living Arrangements: Alone Available Help at Discharge: Family Type of Home: House Home Access: Level entry     [place]: One level     Bathroom Shower/Tub: Teacher, early years/pre: [place]: Environmental consultant - 2 wheels;Shower seat          Prior Functioning/Environment Level of Independence: Independent        Comments: Pt was I at home with walker with daugthers checking on pt daily    OT Diagnosis: Generalized weakness   OT Problem List: Decreased strength;Decreased activity tolerance   OT Treatment/Interventions: Self-care/ADL training;Patient/family education;DME and/or AE instruction    OT Goals(Current goals can be found in the care plan section) Acute Rehab OT Goals Patient Stated Goal: get  back home and watch TV OT Goal Formulation: With patient Time For Goal Achievement: 02/16/14 Potential to Achieve Goals: Good ADL Goals Pt Will Perform Grooming: with supervision;standing Pt Will Perform Lower Body Dressing: with supervision;sit to/from stand Pt Will Transfer to Toilet: with supervision;ambulating Pt Will Perform Toileting - Clothing Manipulation and hygiene: with supervision;sit to/from stand Pt Will Perform Tub/Shower Transfer: with  supervision;ambulating;shower seat;rolling walker  OT Frequency: Min 2X/week              End of Session Equipment Utilized During Treatment: Gait belt Nurse Communication: Mobility status  Activity Tolerance: Patient tolerated treatment well Patient left: in chair;with call bell/phone within reach;with chair alarm set   Time: [phone] OT Time Calculation (min): 33 min Charges:  OT General Charges $OT Visit: 1 Procedure OT Evaluation $Initial OT Evaluation Tier I: 1 Procedure OT Treatments $Self Care/Home Management : 23-37 mins G-Codes:    [name] 2014-02-21, 9:52 AM

## 2014-02-02 NOTE — Progress Notes (Signed)
PT Cancellation Note  Patient Details Name: Manuel King MRN: [id number] DOB: 03-Jun-1929   Cancelled Treatment:    Reason Eval/Treat Not Completed: Medical issues which prohibited therapy (pt just back to bed with nsg and with high BP)   Rocky Rishel,[name] 02/02/2014, 10:44 AM [name], PT, DPT 02/02/2014 Pager: [phone]

## 2014-02-02 NOTE — Progress Notes (Signed)
Echocardiogram 2D Echocardiogram has been performed.  Bilaal, Leib 02/02/2014, 4:17 PM

## 2014-02-02 NOTE — Evaluation (Signed)
Physical Therapy Evaluation Patient Details Name: Manuel King MRN: [id number] DOB: 1929/12/14 Today's Date: 02/02/2014   History of Present Illness  Pt is an 80 y.o. male admitted with weakness and UTI with hx of cellulits, dementia, prostate cancer, hypertension, CVA.  Clinical Impression  Pt currently with functional limitations due to the deficits listed below (see PT Problem List).  Pt will benefit from skilled PT to increase their independence and safety with mobility to allow discharge to the venue listed below.  Pt assisted OOB to [facility] and then back to bed, deferred further mobility due to elevated BP upon transfer (see below).  Pt reports living alone and would recommend assist for mobility at home for safety at this time.  No family present and pt would benefit from SNF if family is not able to assist.     Follow Up Recommendations SNF;Supervision for mobility/OOB    Equipment Recommendations  None recommended by PT    Recommendations for Other Services       Precautions / Restrictions Precautions Precautions: Fall Precaution Comments: monitor BP      Mobility  Bed Mobility Overal bed mobility: Needs Assistance Bed Mobility: Supine to Sit;Sit to Supine     Supine to sit: Min assist Sit to supine: Min guard   General bed mobility comments: verbal cues for self assist  Transfers Overall transfer level: Needs assistance Equipment used: Rolling walker (2 wheeled) Transfers: Sit to/from [facility] Sit to Stand: Min assist;+2 safety/equipment Stand pivot transfers: +2 safety/equipment;Min assist       General transfer comment: verbal cues for technique and safety, runny BM upon standing so transferred to [facility] and BP elevated so then assisted back to bed  Ambulation/Gait Ambulation/Gait assistance:  (deferred due to elevated BP with transfers)              Stairs            Wheelchair Mobility    Modified Rankin (Stroke Patients  Only)       Balance                                             Pertinent Vitals/Pain Pain Assessment: No/denies pain At rest BP 167/61 mmHg Upon sitting after transfer: 203/74 mmHg HR 62 bpm Upon return to supine: 177/71 mmHg HR 57    Home Living Family/patient expects to be discharged to:: Private residence Living Arrangements: Alone Available Help at Discharge: Family Type of Home: House Home Access: Level entry     Home Layout: One level Home Equipment: Environmental consultant - 2 wheels;Shower seat      Prior Function Level of Independence: Independent with assistive device(s)               Hand Dominance        Extremity/Trunk Assessment               Lower Extremity Assessment: Generalized weakness         Communication   Communication: No difficulties  Cognition Arousal/Alertness: Awake/alert Behavior During Therapy: WFL for tasks assessed/performed Overall Cognitive Status: Within Functional Limits for tasks assessed                      General Comments      Exercises        Assessment/Plan    PT Assessment Patient  needs continued PT services  PT Diagnosis Difficulty walking   PT Problem List Decreased strength;Decreased activity tolerance;Decreased mobility  PT Treatment Interventions Gait training;DME instruction;Functional mobility training;Patient/family education;Therapeutic activities;Therapeutic exercise   PT Goals (Current goals can be found in the Care Plan section) Acute Rehab PT Goals PT Goal Formulation: With patient Time For Goal Achievement: 02/09/14 Potential to Achieve Goals: Good    Frequency Min 3X/week   Barriers to discharge        Co-evaluation               End of Session Equipment Utilized During Treatment: Gait belt Activity Tolerance: Patient tolerated treatment well Patient left: in bed;with call bell/phone within reach           Time: 1448-1510 PT Time Calculation  (min): 22 min   Charges:   PT Evaluation $Initial PT Evaluation Tier I: 1 Procedure PT Treatments $Therapeutic Activity: 8-22 mins   PT G Codes:          Manuel King,Manuel King 02/02/2014, 3:22 PM Manuel King, PT, DPT 02/02/2014 Pager: [phone]

## 2014-02-02 NOTE — Progress Notes (Signed)
Patient ID: Manuel King, male   DOB: [birth year], 80 y.o.   MRN: [id number]  [facility] PROGRESS NOTE  JASIN BRAZEL YWV:[id number] DOB: Apr 17, 1930 DOA: 02/01/2014 PCP: Merrilee Seashore, MD  Brief narrative: 80 y.o. male with HTN, CVA, dementia, presented with generalized weakness, at baseline uses walker for ambulating but has not been able to walk in the past 48 hours due to weakness. Pt was not able to provide much history at the time of the admission. In the ED, pt noted to have possible UTI and [facility] asked to admit for further evaluation and management.   Assessment and Plan:    Active Problems:   UTI (lower urinary tract infection) - clinically stable this AM, denies any urinary concerns - continue Rocephin day #2 - follow up on urine culture    Bradycardia - asymptomatic, continue to monitor    Acute renal failure imposed on chronic kidney disease, stage II - III - pre renal etiology imposed on CKD - IVF provided and Cr continues trending down - repeat BMP in AM   Acute on chronic blood loss anemia - drop in hg since admission likely dilutional - no signs of active bleeding - repeat CBC in AM   Failure to thrive - request nutrition consultation - advance diet as pt able to tolerate    Dementia - continue donepezil    HLD - continue statin    Accelerated HTN - continue Norvasc, add Hydralazine scheduled and as needed   DVT prophylaxis  Lovenox SQ while pt is in hospital  Code Status: Full Family Communication: Pt at bedside Disposition Plan: Home when medically stable   IV Access:   Peripheral IV Procedures and diagnostic studies:   Dg Chest 2 View  02/02/2014   No acute abnormalities.  08:52  Ct Head Wo Contrast  02/01/2014   No evidence of acute intracranial abnormality.  Old right basal ganglia lacunar infarct.  Atrophy with small vessel ischemic changes and intracranial atherosclerosis.     Medical Consultants:   None  Other Consultants:   Physical  therapy  Nutritionist  Anti-Infectives:   Rocephin 9/30 -->  Faye Ramsay, MD  [facility] Pager [phone]  If 7PM-7AM, please contact night-coverage [email or web address] Password [email or web address] 02/02/2014, 10:57 AM   LOS: 1 day   HPI/Subjective: No events overnight.   Objective: Filed Vitals:   02/02/14 0002 02/02/14 0037 02/02/14 0530 02/02/14 0914  BP: 172/58 178/70 151/62 168/71  Pulse: 52 60 51   Temp:  98.1 F (36.7 C) 98.2 F (36.8 C)   TempSrc:  Oral Oral   Resp: 16 18 18    Height:  6\' 4"  (1.93 m)    Weight:  103.057 kg (227 lb 3.2 oz)    SpO2: 99% 100%      Intake/Output Summary (Last 24 hours) at 02/02/14 1057 Last data filed at 02/02/14 1046  Gross per 24 hour  Intake   1020 ml  Output    550 ml  Net    470 ml    Exam:   General:  Pt is alert, follows commands appropriately, not in acute distress  Cardiovascular: Regular rate and rhythm, no rubs, no gallops  Respiratory: Clear to auscultation bilaterally, no wheezing, no crackles, no rhonchi  Abdomen: Soft, non tender, non distended, bowel sounds present, no guarding  Extremities: No edema, pulses DP and PT palpable bilaterally  Data Reviewed: Basic Metabolic Panel:  Recent Labs Lab 02/01/14 1938 02/01/14 2012 02/02/14 0030 02/02/14 0640  NA 138 138  --  143  K 4.3 4.1  --  4.0  CL 103 107  --  108  CO2 20  --   --  21  GLUCOSE 130* 127*  --  100*  BUN 38* 38*  --  34*  CREATININE 1.80* 1.90* 1.68* 1.61*  CALCIUM 8.8  --   --  8.4  MG  --   --   --  2.1  PHOS  --   --   --  4.6   Liver Function Tests:  Recent Labs Lab 02/02/14 0640  AST 8  ALT <5  ALKPHOS 66  BILITOT 0.3  PROT 6.8  ALBUMIN 2.7*   CBC:  Recent Labs Lab 02/01/14 1938 02/01/14 2012 02/02/14 0030 02/02/14 0640  WBC 5.5  --  6.1 5.1  NEUTROABS 2.8  --   --   --   HGB 11.1* 11.9* 10.9* 10.5*  HCT 33.2* 35.0* 35.0* 32.3*  MCV 80.8  --  82.7 80.8  PLT 188  --  173 177   Cardiac Enzymes:  Recent Labs Lab  02/02/14 0030 02/02/14 0640  TROPONINI <0.30 <0.30    Scheduled Meds: . allopurinol  100 mg Oral Daily  . amLODipine  5 mg Oral Daily  . aspirin EC  81 mg Oral Daily  . cefTRIAXone  IV  1 g Intravenous Q24H  . divalproex  250 mg Oral Daily  . divalproex  500 mg Oral QHS  . docusate sodium  100 mg Oral BID  . donepezil  10 mg Oral QHS  . enoxaparin  injection  30 mg Subcutaneous Q24H  . gabapentin  800 mg Oral TID  . oxybutynin  5 mg Oral TID  . senna  1 tablet Oral BID  . simvastatin  20 mg Oral q1800   Continuous Infusions:

## 2014-02-03 DIAGNOSIS — E440 Moderate protein-calorie malnutrition: Secondary | ICD-10-CM | POA: Insufficient documentation

## 2014-02-03 DIAGNOSIS — N390 Urinary tract infection, site not specified: Secondary | ICD-10-CM

## 2014-02-03 LAB — CBC
HCT: 33.5 % — ABNORMAL LOW (ref 39.0–52.0)
HEMOGLOBIN: 10.5 g/dL — AB (ref 13.0–17.0)
MCH: 25.5 pg — ABNORMAL LOW (ref 26.0–34.0)
MCHC: 31.3 g/dL (ref 30.0–36.0)
MCV: 81.3 fL (ref 78.0–100.0)
PLATELETS: 170 10*3/uL (ref 150–400)
RBC: 4.12 MIL/uL — ABNORMAL LOW (ref 4.22–5.81)
RDW: 17.1 % — ABNORMAL HIGH (ref 11.5–15.5)
WBC: 5.9 10*3/uL (ref 4.0–10.5)

## 2014-02-03 LAB — BASIC METABOLIC PANEL
ANION GAP: 14 (ref 5–15)
BUN: 27 mg/dL — ABNORMAL HIGH (ref 6–23)
CO2: 21 mEq/L (ref 19–32)
Calcium: 8.4 mg/dL (ref 8.4–10.5)
Chloride: 97 mEq/L (ref 96–112)
Creatinine, Ser: 1.35 mg/dL (ref 0.50–1.35)
GFR calc Af Amer: 55 mL/min — ABNORMAL LOW (ref >90)
GFR, EST NON AFRICAN AMERICAN: 47 mL/min — AB (ref >90)
GLUCOSE: 120 mg/dL — AB (ref 70–99)
Potassium: 3.9 mEq/L (ref 3.7–5.3)
SODIUM: 132 meq/L — AB (ref 137–147)

## 2014-02-03 MED ORDER — BOOST PLUS PO LIQD
237.0000 mL | ORAL | Status: DC
  Administered 2014-02-03: 237 mL via ORAL
  Filled 2014-02-03 (×2): qty 237

## 2014-02-03 NOTE — Progress Notes (Signed)
Clinical Social Work Department BRIEF PSYCHOSOCIAL ASSESSMENT 02/03/2014  Patient:  Manuel King,Manuel King     Account Number:  [id number]     Admit date:  02/01/2014  Clinical Social Worker:  ,, LCSW  Date/Time:  02/03/2014 10:52 AM  Referred by:  Physician  Date Referred:  02/03/2014 Referred for  SNF Placement   Other Referral:   Interview type:  Patient Other interview type:    PSYCHOSOCIAL DATA Living Status:  ALONE Admitted from facility:   Level of care:   Primary support name:  Manuel King / Manuel King  Primary support relationship to patient:  CHILD, ADULT Degree of support available:   supportive    CURRENT CONCERNS Current Concerns  Post-Acute Placement   Other Concerns:    SOCIAL WORK ASSESSMENT / PLAN Pt is an 80 yr old gentleman living  home, alone, prior to hospitalization. CSW has been consulted to assist with d/c planning. PN reviewed. PT OT are recommending SNF vs 24/7 supervision at home. CSW met with pt on 10/1 and spoke with daughter, Manuel King ( [phone] ) to assist with d/c planning. Family provides assistance / supervision overnight and most of the day. Pt is alone for no more than hrs during the day. Pt has been to rehab in the past . Daughter states pt did not want to be in rehab and did not progress well. At this time family feels comfortable having pt return home with HH services and continued support from family. RNCN has been contacted and she will assist pt/family with HH services.   Assessment/plan status:  No Further Intervention Required Other assessment/ plan:   Information/referral to community resources:   [facility] Rehab vs HH services    PATIENT'S/FAMILY'S RESPONSE TO PLAN OF CARE: Pt would like to return home and family supports this plan.Family feels HH services would be helpful. Family is looking forward to pt returning home, hopefully tomorrow.     LCSW [phone]   

## 2014-02-03 NOTE — Progress Notes (Addendum)
Occupational Therapy Treatment Patient Details Name: Manuel King MRN: [id number] DOB: [birth year] Today's Date: 02/03/2014    History of present illness Pt is an 80 y.o. male admitted with weakness and UTI with hx of cellulits, dementia, prostate cancer, hypertension, CVA.   OT comments  Pt requires mod assist to stand from EOB but then does well with pivot only requiring min assist but cues for safe walker use. Feel pt would benefit from short SNF stay and unsure how much family assist is available. If pt were to d/c home, would need 24/7 assist. Explained to pt to discuss d/c plan with family. Note BP increases with EOB 167/82 to 180/69 once in chair. Nursing made aware.   Follow Up Recommendations  SNF;Supervision/Assistance - 24 hour;Other (comment) (if pt d/c home, would need HHOT and 24/7 assist. )    Equipment Recommendations  None recommended by OT    Recommendations for Other Services      Precautions / Restrictions Precautions Precautions: Fall Precaution Comments: monitor BP Restrictions Weight Bearing Restrictions: No       Mobility Bed Mobility Overal bed mobility: Needs Assistance Bed Mobility: Supine to Sit     Supine to sit: Min assist        Transfers Overall transfer level: Needs assistance Equipment used: Rolling walker (2 wheeled) Transfers: Sit to/from Stand Sit to Stand: Mod assist Stand pivot transfers: Min assist       General transfer comment: verbal cues for hand placement, mod assist to boost to standing but did well once in standing to pivot around to chair. Needs constant cues for posture, hand placement and safe walker  use.    Balance                                   ADL                       Lower Body Dressing: Minimal assistance;Sitting/lateral leans Lower Body Dressing Details (indicate cue type and reason): don socks Toilet Transfer: Moderate assistance;Stand-pivot;RW             General ADL  Comments: Pt stating he has a son and daughter that can help at d/c. Explained to pt that he needs to discuss his assistance required with family and determine if SNF best option. He currently requires mod assist to stand and pivot to chair. BP sitting EOB 167/82 and after transfer to chair 180/69. Nursing made aware and in room once pt in chair. Pt only needed min assist to don socks to help straighten sock at the toe which was caught on one toe.       Vision                     Perception     Praxis      Cognition   Behavior During Therapy: WFL for tasks assessed/performed Overall Cognitive Status: Within Functional Limits for tasks assessed                       Extremity/Trunk Assessment               Exercises     Shoulder Instructions       General Comments      Pertinent Vitals/ Pain       Pain Assessment: No/denies pain  Home Living  Prior Functioning/Environment              Frequency Min 2X/week     Progress Toward Goals  OT Goals(current goals can now be found in the care plan section)  Progress towards OT goals: Progressing toward goals     Plan Discharge plan remains appropriate    Co-evaluation                 End of Session Equipment Utilized During Treatment: Gait belt;Rolling walker   Activity Tolerance Patient tolerated treatment well   Patient Left in chair;with call bell/phone within reach;with nursing/sitter in room;with chair alarm set   Nurse Communication Mobility status        Time: 7014-1030 OT Time Calculation (min): 28 min  Charges: OT General Charges $OT Visit: 1 Procedure OT Treatments $Self Care/Home Management : 8-22 mins $Therapeutic Activity: 8-22 mins  Jules Schick [phone] 02/03/2014, 10:37 AM

## 2014-02-03 NOTE — Progress Notes (Signed)
Patient ID: Manuel King, male   DOB: 13-May-1929, 80 y.o.   MRN: [id number]  [facility] PROGRESS NOTE  Manuel King [id number]:[id number] DOB: [birth year] DOA: 02/01/2014 PCP: Merrilee Seashore, MD  Brief narrative:  80 y.o. male with HTN, CVA, dementia, presented with generalized weakness, at baseline uses walker for ambulating but has not been able to walk in the past 48 hours due to weakness. Pt was not able to provide much history at the time of the admission. In the ED, pt noted to have possible UTI and [facility] asked to admit for further evaluation and management.   Assessment and Plan:   Active Problems:  UTI (lower urinary tract infection)  - clinically stable this AM, denies any urinary concerns  - continue Rocephin day #3 - follow up on urine culture,preliminary report with gram - rods Bradycardia  - asymptomatic, d/c telemetry  - pt denies chest pain  Acute renal failure imposed on chronic kidney disease, stage II - III  - pre renal etiology imposed on CKD  - IVF provided and Cr continues trending down, WNL this AM - repeat BMP in AM  Acute on chronic blood loss anemia  - drop in hg since admission likely dilutional  - no signs of active bleeding  - repeat CBC in AM  Failure to thrive  - request nutrition consultation  - advance diet as pt able to tolerate  Dementia  - continue donepezil  HLD  - continue statin  Accelerated HTN  - continue Norvasc, add Hydralazine scheduled and as needed   DVT prophylaxis  Lovenox SQ while pt is in hospital  Code Status: Full  Family Communication: Pt at bedside  Disposition Plan: SNF in 24-48 hours   IV Access:   Peripheral IV Procedures and diagnostic studies:   Dg Chest 2 View 02/02/2014 No acute abnormalities. 08:52  Ct Head Wo Contrast 02/01/2014 No evidence of acute intracranial abnormality. Old right basal ganglia lacunar infarct. Atrophy with small vessel ischemic changes and intracranial atherosclerosis.  Medical  Consultants:   None  Other Consultants:   Physical therapy  Nutritionist  Anti-Infectives:   Rocephin 9/30 -->  Faye Ramsay, MD  [facility] Pager [phone]  If 7PM-7AM, please contact night-coverage [email or web address] Password [email or web address] 02/03/2014, 8:10 AM   LOS: 2 days   HPI/Subjective: No events overnight.   Objective: Filed Vitals:   02/02/14 1450 02/02/14 2201 02/02/14 2315 02/03/14 0459  BP: 167/61 173/62 162/80 149/68  Pulse: 52 50  50  Temp:  98.7 F (37.1 C)  97.8 F (36.6 C)  TempSrc:  Oral  Oral  Resp:  20  18  Height:      Weight:      SpO2:  100%  99%    Intake/Output Summary (Last 24 hours) at 02/03/14 0810 Last data filed at 02/02/14 2300  Gross per 24 hour  Intake   1370 ml  Output   1400 ml  Net    -30 ml    Exam:   General:  Pt is alert, follows commands appropriately, not in acute distress  Cardiovascular: Regular rhythm, bradycardic, no rubs, no gallops  Respiratory: Clear to auscultation bilaterally, no wheezing, no crackles, no rhonchi  Abdomen: Soft, non tender, non distended, bowel sounds present, no guarding  Extremities: pulses DP and PT palpable bilaterally   Data Reviewed: Basic Metabolic Panel:  Recent Labs Lab 02/01/14 1938 02/01/14 2012 02/02/14 0030 02/02/14 0640 02/03/14 0455  NA 138 138  --  143 132*  K  4.3 4.1  --  4.0 3.9  CL 103 107  --  108 97  CO2 20  --   --  21 21  GLUCOSE 130* 127*  --  100* 120*  BUN 38* 38*  --  34* 27*  CREATININE 1.80* 1.90* 1.68* 1.61* 1.35  CALCIUM 8.8  --   --  8.4 8.4  MG  --   --   --  2.1  --   PHOS  --   --   --  4.6  --    Liver Function Tests:  Recent Labs Lab 02/02/14 0640  AST 8  ALT <5  ALKPHOS 66  BILITOT 0.3  PROT 6.8  ALBUMIN 2.7*   CBC:  Recent Labs Lab 02/01/14 1938 02/01/14 2012 02/02/14 0030 02/02/14 0640 02/03/14 0455  WBC 5.5  --  6.1 5.1 5.9  NEUTROABS 2.8  --   --   --   --   HGB 11.1* 11.9* 10.9* 10.5* 10.5*  HCT 33.2* 35.0* 35.0* 32.3*  33.5*  MCV 80.8  --  82.7 80.8 81.3  PLT 188  --  173 177 170   Cardiac Enzymes:  Recent Labs Lab 02/02/14 0030 02/02/14 0640 02/02/14 1204  TROPONINI <0.30 <0.30 <0.30    Recent Results (from the past 240 hour(s))  URINE CULTURE     Status: None   Collection Time    02/01/14  9:02 PM      Result Value Ref Range Status   Specimen Description URINE, CATHETERIZED   Final   Special Requests NONE   Final   Culture  Setup Time     Final   Value: 02/02/2014 01:25     Performed at [facility]     Final   Value: >=100,000 COLONIES/ML     Performed at [facility]   Culture     Final   Value: GRAM NEGATIVE RODS     Performed at [facility]   Report Status PENDING   Incomplete     Scheduled Meds: . allopurinol  100 mg Oral Daily  . amLODipine  5 mg Oral Daily  . aspirin EC  81 mg Oral Daily  . cefTRIAXone (ROCEPHIN)  IV  1 g Intravenous Q24H  . divalproex  250 mg Oral Daily  . divalproex  500 mg Oral QHS  . docusate sodium  100 mg Oral BID  . donepezil  10 mg Oral QHS  . enoxaparin (LOVENOX) injection  40 mg Subcutaneous Q24H  . gabapentin  800 mg Oral TID  . hydrALAZINE  10 mg Oral 3 times per day  . oxybutynin  5 mg Oral TID  . senna  1 tablet Oral BID  . simvastatin  20 mg Oral q1800  . sodium chloride  3 mL Intravenous Q12H   Continuous Infusions:

## 2014-02-03 NOTE — Progress Notes (Signed)
INITIAL NUTRITION ASSESSMENT  DOCUMENTATION CODES Per approved criteria  -Non-severe (moderate) malnutrition in the context of chronic illness  Pt meets criteria for moderate MALNUTRITION in the context of chronic illness as evidenced by moderate subcutaneous fat and moderate muscle wasting   INTERVENTION: -Recommend Boost Plus once daily -Encouraged intake of balanced meals and snacks -RD to continue to monitor  NUTRITION DIAGNOSIS: Inadequate oral intake related to dementia/weakness as evidenced by decreased PO intake, moderate muscle and fat wasting.   Goal: Pt to meet >/= 90% of their estimated nutrition needs    Monitor:  Total protein/energy intake, labs, weights  Reason for Assessment: Consult   80 y.o. male  Admitting Dx: <principal problem not specified>  ASSESSMENT: Patient was noted to be unstable on his feet he had hard time getting. Patient has dementia but able to walk with a walker at baseline and eat his meal. Family noted that he appeared shaky. Denies any fever. He is able to get dressed up at baseline. Today he haven't had much PO intake. Patient unable to provide hx family is at bedside. In ER he was found to have UTI.  -Pt reported fair appetite pta. Normally eats two meals daily and snacks in between. Breakfast consists of four hard boiled eggs, and coffee. Daughter provides pt with dinner meal of various proteins and sides, pt noted that his family discourages him from cooking when he is home alone -Has tried Ensure once before, but disliked taste and caused him GI upset. Was willing to trial Boost as afternoon snack if he continues to skip lunch meal -Denied any unintentional wt loss, reported that he had hx of weight loss when he was younger; however has been able to maintain current weight. Previous medical records indicate pt weight has fluctuated 35 lbs over past 5 years (192-227 lb, non-significant for time frame) -Did not eat well for 1 days pta per  H&P; however appetite improving during admit. Current PO intake 75%. Denied any nausea, vomiting or abd pain post meals -Nutrition Focused Physical Exam:  Subcutaneous Fat:  Orbital Region: moderate wasting Upper Arm Region: WDL Thoracic and Lumbar Region: n/a  Muscle:  Temple Region: moderate wasting Clavicle Bone Region: moderate wasting Clavicle and Acromion Bone Region: moderate wasting Scapular Bone Region: n/a Dorsal Hand: WDL Patellar Region:WDL Anterior Thigh Region: WDL Posterior Calf Region: WDL  Edema: none noted    Height: Ht Readings from Last 1 Encounters:  02/02/14 6\' 4"  (1.93 m)    Weight: Wt Readings from Last 1 Encounters:  02/02/14 227 lb 3.2 oz (103.057 kg)    Ideal Body Weight: 202 lb  % Ideal Body Weight: 112%  Wt Readings from Last 10 Encounters:  02/02/14 227 lb 3.2 oz (103.057 kg)  09/12/11 200 lb 9.9 oz (91 kg)  05/10/11 211 lb (95.709 kg)  09/29/08 192 lb (87.091 kg)    Usual Body Weight: unable to determine  % Usual Body Weight: unable to determine  BMI:  Body mass index is 27.67 kg/(m^2).  Estimated Nutritional Needs: Kcal: 2000-2200 Protein: 100-110 gram Fluid: >/= 2000 ml daily  Skin: WDL  Diet Order: General  EDUCATION NEEDS: -No education needs identified at this time   Intake/Output Summary (Last 24 hours) at 02/03/14 1100 Last data filed at 02/02/14 2300  Gross per 24 hour  Intake   1010 ml  Output   1050 ml  Net    -40 ml    Last BM: 10/01   Labs:   Recent  Labs Lab 02/01/14 1938 02/01/14 2012 02/02/14 0030 02/02/14 0640 02/03/14 0455  NA 138 138  --  143 132*  K 4.3 4.1  --  4.0 3.9  CL 103 107  --  108 97  CO2 20  --   --  21 21  BUN 38* 38*  --  34* 27*  CREATININE 1.80* 1.90* 1.68* 1.61* 1.35  CALCIUM 8.8  --   --  8.4 8.4  MG  --   --   --  2.1  --   PHOS  --   --   --  4.6  --   GLUCOSE 130* 127*  --  100* 120*    CBG (last 3)  No results found for this basename: GLUCAP,  in the  last 72 hours  Scheduled Meds: . allopurinol  100 mg Oral Daily  . amLODipine  5 mg Oral Daily  . aspirin EC  81 mg Oral Daily  . cefTRIAXone (ROCEPHIN)  IV  1 g Intravenous Q24H  . divalproex  250 mg Oral Daily  . divalproex  500 mg Oral QHS  . docusate sodium  100 mg Oral BID  . donepezil  10 mg Oral QHS  . enoxaparin (LOVENOX) injection  40 mg Subcutaneous Q24H  . gabapentin  800 mg Oral TID  . hydrALAZINE  10 mg Oral 3 times per day  . oxybutynin  5 mg Oral TID  . senna  1 tablet Oral BID  . simvastatin  20 mg Oral q1800  . sodium chloride  3 mL Intravenous Q12H    Continuous Infusions:   Past Medical History  Diagnosis Date  . Hypertension   . CVA (cerebral vascular accident)   . Dementia   . Prostate cancer   . Cellulitis currently    right foot  . Arthritis     Past Surgical History  Procedure Laterality Date  . Neck surgery    . Back surgery    . Spider bite surgery      on left leg  . Prostate seed implants     Atlee Abide MS RD LDN Clinical Dietitian [phone]

## 2014-02-03 NOTE — Progress Notes (Signed)
Changed to observation status   Faye Ramsay, MD  [facility] Pager [phone]  If 7PM-7AM, please contact night-coverage [email or web address] Password [email or web address]

## 2014-02-04 LAB — CBC
HCT: 34.9 % — ABNORMAL LOW (ref 39.0–52.0)
HEMOGLOBIN: 11.4 g/dL — AB (ref 13.0–17.0)
MCH: 26.1 pg (ref 26.0–34.0)
MCHC: 32.7 g/dL (ref 30.0–36.0)
MCV: 79.9 fL (ref 78.0–100.0)
Platelets: 196 10*3/uL (ref 150–400)
RBC: 4.37 MIL/uL (ref 4.22–5.81)
RDW: 17.2 % — ABNORMAL HIGH (ref 11.5–15.5)
WBC: 5.5 10*3/uL (ref 4.0–10.5)

## 2014-02-04 LAB — URINE CULTURE: Colony Count: >=100000

## 2014-02-04 LAB — BASIC METABOLIC PANEL
ANION GAP: 14 (ref 5–15)
BUN: 25 mg/dL — ABNORMAL HIGH (ref 6–23)
CO2: 22 meq/L (ref 19–32)
CREATININE: 1.28 mg/dL (ref 0.50–1.35)
Calcium: 8.7 mg/dL (ref 8.4–10.5)
Chloride: 99 mEq/L (ref 96–112)
GFR calc Af Amer: 58 mL/min — ABNORMAL LOW (ref >90)
GFR calc non Af Amer: 50 mL/min — ABNORMAL LOW (ref >90)
GLUCOSE: 118 mg/dL — AB (ref 70–99)
Potassium: 4.2 mEq/L (ref 3.7–5.3)
Sodium: 135 mEq/L — ABNORMAL LOW (ref 137–147)

## 2014-02-04 MED ORDER — HYDROCODONE-ACETAMINOPHEN 5-325 MG PO TABS: 1.0000 | ORAL_TABLET | ORAL | Status: DC | PRN

## 2014-02-04 MED ORDER — CIPROFLOXACIN HCL 250 MG PO TABS: 250.0000 mg | ORAL_TABLET | Freq: Two times a day (BID) | ORAL | Status: DC

## 2014-02-04 MED ORDER — HYDRALAZINE HCL 10 MG PO TABS: 10.0000 mg | ORAL_TABLET | Freq: Three times a day (TID) | ORAL | Status: DC

## 2014-02-04 NOTE — Discharge Summary (Signed)
Physician Discharge Summary  Manuel King QHU:[id number] DOB: 17-Jun-1929 DOA: 02/01/2014  PCP: Merrilee Seashore, MD  Admit date: 02/01/2014 Discharge date: 02/04/2014  Recommendations for Outpatient Follow-up:  1. Pt will need to follow up with PCP in 2-3 weeks post discharge 2. Please obtain BMP to evaluate electrolytes and kidney function 3. Please also check CBC to evaluate Hg and Hct levels 4. cipro for 5 days upon discharge  5. Started hydralazine 10 mg TID for better BP control   Discharge Diagnoses:  Active Problems:   Essential hypertension   UTI (lower urinary tract infection)   Bradycardia   Acute renal failure   Constipation   Malnutrition of moderate degree  Discharge Condition: Stable  Diet recommendation: Heart healthy diet discussed in details   Brief narrative:  80 y.o. male with HTN, CVA, dementia, presented with generalized weakness, at baseline uses walker for ambulating but has not been able to walk in the past 48 hours due to weakness. Pt was not able to provide much history at the time of the admission. In the ED, pt noted to have possible UTI and [facility] asked to admit for further evaluation and management.   Assessment and Plan:   Active Problems:  UTI (lower urinary tract infection)  - clinically stable this AM, denies any urinary concerns  - continue Rocephin day #4, transition to Cipro for 5 more days post discharge  - urine culture preliminary report with gram - rods  Bradycardia  - asymptomatic, d/c telemetry  - pt denies chest pain  Acute renal failure imposed on chronic kidney disease, stage II - III  - pre renal etiology imposed on CKD  - IVF provided and Cr continues trending down, WNL this AM  Acute on chronic blood loss anemia  - drop in hg since admission likely dilutional  - no signs of active bleeding  - repeat CBC in AM  Failure to thrive  - request nutrition consultation  - pt tolerating regular diet well  Dementia  - continue  donepezil  HLD  - continue statin  Accelerated HTN  - continue Norvasc, added Hydralazine for better BP control    Code Status: Full  Family Communication: Pt and family at bedside  Disposition Plan: Home  IV Access:   Peripheral IV Procedures and diagnostic studies:   Dg Chest 2 View 02/02/2014 No acute abnormalities. 08:52  Ct Head Wo Contrast 02/01/2014 No evidence of acute intracranial abnormality. Old right basal ganglia lacunar infarct. Atrophy with small vessel ischemic changes and intracranial atherosclerosis.  Medical Consultants:   None  Other Consultants:   Physical therapy  Nutritionist  Anti-Infectives:   Rocephin 9/30 --> 10/03 Cipro 10/03 --> 5 more days post discharge   Discharge Exam: Filed Vitals:   02/04/14 0513  BP: 134/72  Pulse: 65  Temp: 98.4 F (36.9 C)  Resp: 17   Filed Vitals:   02/03/14 1020 02/03/14 1456 02/03/14 2153 02/04/14 0513  BP: 180/69 128/59 150/65 134/72  Pulse:  57 52 65  Temp:  98.1 F (36.7 C) 97.9 F (36.6 C) 98.4 F (36.9 C)  TempSrc:  Oral Oral Oral  Resp:  16 18 17   Height:      Weight:      SpO2:  100% 100% 100%    General: Pt is alert, follows commands appropriately, not in acute distress Cardiovascular: Regular rate and rhythm, no rubs, no gallops Respiratory: Clear to auscultation bilaterally, no wheezing, no crackles, no rhonchi Abdominal: Soft, non tender, non  distended, bowel sounds +, no guarding Extremities: no edema, no cyanosis, pulses palpable bilaterally DP and PT Neuro: Grossly nonfocal  Discharge Instructions  Discharge Instructions   Diet - low sodium heart healthy    Complete by:  As directed      Increase activity slowly    Complete by:  As directed             Medication List         allopurinol 100 MG tablet  Commonly known as:  ZYLOPRIM  Take 100 mg by mouth daily.     amLODipine 5 MG tablet  Commonly known as:  NORVASC  Take 5 mg by mouth daily.     aspirin EC 81 MG tablet   Take 81 mg by mouth daily.     ciprofloxacin 250 MG tablet  Commonly known as:  CIPRO  Take 1 tablet (250 mg total) by mouth 2 (two) times daily.     divalproex 250 MG DR tablet  Commonly known as:  DEPAKOTE  Take 250 mg by mouth See admin instructions. Take one tablet in the am and 2 tablets in the pm     donepezil 10 MG tablet  Commonly known as:  ARICEPT  Take 10 mg by mouth at bedtime.     gabapentin 800 MG tablet  Commonly known as:  NEURONTIN  Take 800 mg by mouth 3 (three) times daily.     hydrALAZINE 10 MG tablet  Commonly known as:  APRESOLINE  Take 1 tablet (10 mg total) by mouth every 8 (eight) hours.     hydrochlorothiazide 12.5 MG capsule  Commonly known as:  MICROZIDE  Take 12.5 mg by mouth daily.     HYDROcodone-acetaminophen 5-325 MG per tablet  Commonly known as:  NORCO/VICODIN  Take 1-2 tablets by mouth every 4 (four) hours as needed for moderate pain.     irbesartan 150 MG tablet  Commonly known as:  AVAPRO  Take 150 mg by mouth daily.     oxybutynin 5 MG tablet  Commonly known as:  DITROPAN  Take 5 mg by mouth 3 (three) times daily.     pravastatin 40 MG tablet  Commonly known as:  PRAVACHOL  Take 40 mg by mouth daily.           Follow-up Information   Schedule an appointment as soon as possible for a visit with [facility], MD.   Specialty:  Internal Medicine   Contact information:   [address] [place] [address] [phone]       Follow up with Faye Ramsay, MD. (As needed call my cell phone [phone])    Specialty:  Internal Medicine   Contact information:   [address] [place] [address] [phone]        The results of significant diagnostics from this hospitalization (including imaging, microbiology, ancillary and laboratory) are listed below for reference.     Microbiology: Recent Results (from the past 240 hour(s))  URINE CULTURE     Status: None    Collection Time    02/01/14  9:02 PM      Result Value Ref Range Status   Specimen Description URINE, CATHETERIZED   Final   Special Requests NONE   Final   Culture  Setup Time     Final   Value: 02/02/2014 01:25     Performed at [place]     Final  Value: >=100,000 COLONIES/ML     Performed at [facility]   Culture     Final   Value: [place]     Performed at [facility]   Report Status PENDING   Incomplete     Labs: Basic Metabolic Panel:  Recent Labs Lab 02/01/14 1938 02/01/14 2012 02/02/14 0030 02/02/14 0640 02/03/14 0455 02/04/14 0455  NA 138 138  --  143 132* 135*  K 4.3 4.1  --  4.0 3.9 4.2  CL 103 107  --  108 97 99  CO2 20  --   --  21 21 22   GLUCOSE 130* 127*  --  100* 120* 118*  BUN 38* 38*  --  34* 27* 25*  CREATININE 1.80* 1.90* 1.68* 1.61* 1.35 1.28  CALCIUM 8.8  --   --  8.4 8.4 8.7  MG  --   --   --  2.1  --   --   PHOS  --   --   --  4.6  --   --    Liver Function Tests:  Recent Labs Lab 02/02/14 0640  AST 8  ALT <5  ALKPHOS 66  BILITOT 0.3  PROT 6.8  ALBUMIN 2.7*   No results found for this basename: LIPASE, AMYLASE,  in the last 168 hours No results found for this basename: AMMONIA,  in the last 168 hours CBC:  Recent Labs Lab 02/01/14 1938 02/01/14 2012 02/02/14 0030 02/02/14 0640 02/03/14 0455 02/04/14 0455  WBC 5.5  --  6.1 5.1 5.9 5.5  NEUTROABS 2.8  --   --   --   --   --   HGB 11.1* 11.9* 10.9* 10.5* 10.5* 11.4*  HCT 33.2* 35.0* 35.0* 32.3* 33.5* 34.9*  MCV 80.8  --  82.7 80.8 81.3 79.9  PLT 188  --  173 177 170 196   Cardiac Enzymes:  Recent Labs Lab 02/02/14 0030 02/02/14 0640 02/02/14 1204  TROPONINI <0.30 <0.30 <0.30   BNP: BNP (last 3 results) No results found for this basename: PROBNP,  in the last 8760 hours CBG: No results found for this basename: GLUCAP,  in the last 168 hours   SIGNED: Time coordinating discharge: Over 30  minutes  Faye Ramsay, MD  [facility] 02/04/2014, 9:13 AM Pager [phone]  If 7PM-7AM, please contact night-coverage [email or web address] Password [email or web address]

## 2014-02-04 NOTE — Progress Notes (Signed)
Occupational Therapy Treatment Patient Details Name: Manuel King MRN: [id number] DOB: October 23, 1929 Today's Date: 02/04/2014    History of present illness Pt. is 26. y.o. male admitted with UTI and weakness.    OT comments  Pt. Very pleasant during therapy and states he is going to d/c home with family support. Pt. Will need 24 hour care at this point secondary to pt. Decreased I with ADLs and mobility. Pt. Requires Mod A with sit to stand from bed and Min/ModA with sit to stand from 3-1 commode. Pt. Would benefit from continued OT to address goals and decrease caregiver burden at D/C. Pt. Had no c/o pain during session.   Follow Up Recommendations  Home health OT;Supervision/Assistance - 24 hour    Equipment Recommendations       Recommendations for Other Services      Precautions / Restrictions Precautions Precautions: Fall Restrictions Weight Bearing Restrictions: No       Mobility Bed Mobility         Supine to sit: Min guard        Transfers       Sit to Stand: Mod assist         General transfer comment: Pt. required Mod A to stand from bed with several attempts needed.     Balance                                   ADL                       Lower Body Dressing: Minimal assistance Lower Body Dressing Details (indicate cue type and reason):  (Pt. required Min A for balance to don/doff socks in sitting ) Toilet Transfer: Minimal assistance;Moderate assistance;BSC           Functional mobility during ADLs: Minimal assistance;Rolling walker General ADL Comments:  (Pt. states he will have family to A as needed.)      Vision                     Perception     Praxis      Cognition   Behavior During Therapy: WFL for tasks assessed/performed Overall Cognitive Status: Within Functional Limits for tasks assessed                       Extremity/Trunk Assessment               Exercises      Shoulder Instructions       General Comments      Pertinent Vitals/ Pain          Home Living                                          Prior Functioning/Environment              Frequency Min 2X/week     Progress Toward Goals  OT Goals(current goals can now be found in the care plan section)  Progress towards OT goals: Progressing toward goals     Plan Discharge plan remains appropriate    Co-evaluation                 End of Session Equipment Utilized During Treatment: Gait belt;Rolling  walker   Activity Tolerance Patient tolerated treatment well   Patient Left in chair;with call bell/phone within reach;with chair alarm set   Nurse Communication          Time: [phone] OT Time Calculation (min): 40 min  Charges: OT General Charges $OT Visit: 1 Procedure OT Treatments $Self Care/Home Management : 23-37 mins $Therapeutic Activity: 8-22 mins  Carnie Bruemmer 02/04/2014, 9:21 AM

## 2014-02-04 NOTE — Discharge Instructions (Signed)

## 2014-02-04 NOTE — Progress Notes (Addendum)
CARE MANAGEMENT NOTE 02/04/2014  Patient:  Manuel King, Manuel King   Account Number:  [id number]  Date Initiated:  02/04/2014  Documentation initiated by:  [facility]  Subjective/Objective Assessment:   UTI     Action/Plan:   lives alone   Anticipated DC Date:  02/04/2014   Anticipated DC Plan:  [place]  CM consult      [facility] Choice  HOME HEALTH   Choice offered to / List presented to:  C-4 Adult Children        HH arranged  HH-2 PT  HH-1 RN  [place].   Status of service:  Completed, signed off Medicare Important Message given?  YES (If response is "NO", the following Medicare IM given date fields will be blank) Date Medicare IM given:  02/04/2014 Medicare IM given by:  [facility] Date Additional Medicare IM given:   Additional Medicare IM given by:    Discharge Disposition:  [place]  Per UR Regulation:    If discussed at Long Length of Stay Meetings, dates discussed:    Comments:  Erenest Rasher, RN Case Manager Addendum CASE MANAGEMENT Progress Notes Service date: 02/04/2014 10:31 AM NCM spoke to pt and gave permission to speak to Leslie Andrea Roddey # [phone]. States he had [employer] in the past but not sure what agency came out. He has RW at home. Pt states he lives at home alone, but his dtrs assist him at home. NCM spoke to dtr, Ms. Casey Burkitt and she requested [facility] for [facility]. NCM contacted [facility] for [facility] for scheduled dc home today. Jonnie Finner RN CCM Case Mgmt phone [phone]

## 2014-05-18 DIAGNOSIS — H00016 Hordeolum externum left eye, unspecified eyelid: Secondary | ICD-10-CM | POA: Diagnosis not present

## 2014-05-18 DIAGNOSIS — F0391 Unspecified dementia with behavioral disturbance: Secondary | ICD-10-CM | POA: Diagnosis not present

## 2014-05-18 DIAGNOSIS — H6123 Impacted cerumen, bilateral: Secondary | ICD-10-CM | POA: Diagnosis not present

## 2014-05-18 DIAGNOSIS — M150 Primary generalized (osteo)arthritis: Secondary | ICD-10-CM | POA: Diagnosis not present

## 2014-06-15 DIAGNOSIS — C61 Malignant neoplasm of prostate: Secondary | ICD-10-CM | POA: Diagnosis not present

## 2014-06-22 DIAGNOSIS — C61 Malignant neoplasm of prostate: Secondary | ICD-10-CM | POA: Diagnosis not present

## 2014-06-22 DIAGNOSIS — N390 Urinary tract infection, site not specified: Secondary | ICD-10-CM | POA: Diagnosis not present

## 2014-06-22 DIAGNOSIS — R32 Unspecified urinary incontinence: Secondary | ICD-10-CM | POA: Diagnosis not present

## 2014-07-17 ENCOUNTER — Emergency Department (HOSPITAL_COMMUNITY)
Admission: EM | Admit: 2014-07-17 | Discharge: 2014-07-18 | Disposition: A | Discharge status: Home / Self Care | Payer: Medicare Other | Attending: Emergency Medicine | Admitting: Emergency Medicine

## 2014-07-17 ENCOUNTER — Emergency Department (HOSPITAL_COMMUNITY): Payer: Medicare Other

## 2014-07-17 DIAGNOSIS — Z79899 Other long term (current) drug therapy: Secondary | ICD-10-CM | POA: Insufficient documentation

## 2014-07-17 DIAGNOSIS — Z8673 Personal history of transient ischemic attack (TIA), and cerebral infarction without residual deficits: Secondary | ICD-10-CM | POA: Diagnosis not present

## 2014-07-17 DIAGNOSIS — Z792 Long term (current) use of antibiotics: Secondary | ICD-10-CM | POA: Insufficient documentation

## 2014-07-17 DIAGNOSIS — Z872 Personal history of diseases of the skin and subcutaneous tissue: Secondary | ICD-10-CM | POA: Diagnosis not present

## 2014-07-17 DIAGNOSIS — W1839XA Other fall on same level, initial encounter: Secondary | ICD-10-CM | POA: Diagnosis not present

## 2014-07-17 DIAGNOSIS — F0390 Unspecified dementia without behavioral disturbance: Secondary | ICD-10-CM | POA: Insufficient documentation

## 2014-07-17 DIAGNOSIS — Y998 Other external cause status: Secondary | ICD-10-CM | POA: Diagnosis not present

## 2014-07-17 DIAGNOSIS — R404 Transient alteration of awareness: Secondary | ICD-10-CM | POA: Diagnosis not present

## 2014-07-17 DIAGNOSIS — R251 Tremor, unspecified: Secondary | ICD-10-CM | POA: Diagnosis not present

## 2014-07-17 DIAGNOSIS — I10 Essential (primary) hypertension: Secondary | ICD-10-CM | POA: Diagnosis present

## 2014-07-17 DIAGNOSIS — Y9301 Activity, walking, marching and hiking: Secondary | ICD-10-CM | POA: Diagnosis not present

## 2014-07-17 DIAGNOSIS — H01009 Unspecified blepharitis unspecified eye, unspecified eyelid: Secondary | ICD-10-CM

## 2014-07-17 DIAGNOSIS — W19XXXA Unspecified fall, initial encounter: Secondary | ICD-10-CM

## 2014-07-17 DIAGNOSIS — Z043 Encounter for examination and observation following other accident: Secondary | ICD-10-CM | POA: Diagnosis present

## 2014-07-17 DIAGNOSIS — Y92009 Unspecified place in unspecified non-institutional (private) residence as the place of occurrence of the external cause: Secondary | ICD-10-CM | POA: Diagnosis not present

## 2014-07-17 DIAGNOSIS — Z7982 Long term (current) use of aspirin: Secondary | ICD-10-CM | POA: Insufficient documentation

## 2014-07-17 DIAGNOSIS — R531 Weakness: Secondary | ICD-10-CM | POA: Diagnosis not present

## 2014-07-17 DIAGNOSIS — M1990 Unspecified osteoarthritis, unspecified site: Secondary | ICD-10-CM | POA: Diagnosis not present

## 2014-07-17 DIAGNOSIS — Z8546 Personal history of malignant neoplasm of prostate: Secondary | ICD-10-CM | POA: Diagnosis not present

## 2014-07-17 DIAGNOSIS — Z87891 Personal history of nicotine dependence: Secondary | ICD-10-CM | POA: Diagnosis not present

## 2014-07-17 DIAGNOSIS — M6281 Muscle weakness (generalized): Secondary | ICD-10-CM | POA: Insufficient documentation

## 2014-07-17 DIAGNOSIS — F0150 Vascular dementia without behavioral disturbance: Secondary | ICD-10-CM | POA: Diagnosis present

## 2014-07-17 DIAGNOSIS — H01006 Unspecified blepharitis left eye, unspecified eyelid: Secondary | ICD-10-CM | POA: Diagnosis not present

## 2014-07-17 DIAGNOSIS — H00015 Hordeolum externum left lower eyelid: Secondary | ICD-10-CM | POA: Insufficient documentation

## 2014-07-17 LAB — URINALYSIS, ROUTINE W REFLEX MICROSCOPIC
BILIRUBIN URINE: NEGATIVE
Glucose, UA: NEGATIVE mg/dL
HGB URINE DIPSTICK: NEGATIVE
KETONES UR: NEGATIVE mg/dL
NITRITE: NEGATIVE
PROTEIN: NEGATIVE mg/dL
Specific Gravity, Urine: 1.018 (ref 1.005–1.030)
UROBILINOGEN UA: 1 mg/dL (ref 0.0–1.0)
pH: 5 (ref 5.0–8.0)

## 2014-07-17 LAB — COMPREHENSIVE METABOLIC PANEL
ALBUMIN: 3.2 g/dL — AB (ref 3.5–5.2)
ALT: 8 U/L (ref 0–53)
ANION GAP: 9 (ref 5–15)
AST: 15 U/L (ref 0–37)
Alkaline Phosphatase: 61 U/L (ref 39–117)
BUN: 30 mg/dL — ABNORMAL HIGH (ref 6–23)
CALCIUM: 8.5 mg/dL (ref 8.4–10.5)
CHLORIDE: 108 mmol/L (ref 96–112)
CO2: 21 mmol/L (ref 19–32)
CREATININE: 1.59 mg/dL — AB (ref 0.50–1.35)
GFR calc Af Amer: 45 mL/min — ABNORMAL LOW (ref >90)
GFR, EST NON AFRICAN AMERICAN: 39 mL/min — AB (ref >90)
Glucose, Bld: 123 mg/dL — ABNORMAL HIGH (ref 70–99)
Potassium: 3.7 mmol/L (ref 3.5–5.1)
Sodium: 138 mmol/L (ref 135–145)
Total Bilirubin: 0.5 mg/dL (ref 0.3–1.2)
Total Protein: 6.9 g/dL (ref 6.0–8.3)

## 2014-07-17 LAB — CBC WITH DIFFERENTIAL/PLATELET
BASOS ABS: 0 10*3/uL (ref 0.0–0.1)
Basophils Relative: 0 % (ref 0–1)
Eosinophils Absolute: 0.2 10*3/uL (ref 0.0–0.7)
Eosinophils Relative: 4 % (ref 0–5)
HEMATOCRIT: 33.1 % — AB (ref 39.0–52.0)
Hemoglobin: 10.5 g/dL — ABNORMAL LOW (ref 13.0–17.0)
LYMPHS PCT: 36 % (ref 12–46)
Lymphs Abs: 1.9 10*3/uL (ref 0.7–4.0)
MCH: 25.5 pg — ABNORMAL LOW (ref 26.0–34.0)
MCHC: 31.7 g/dL (ref 30.0–36.0)
MCV: 80.5 fL (ref 78.0–100.0)
MONO ABS: 0.4 10*3/uL (ref 0.1–1.0)
Monocytes Relative: 7 % (ref 3–12)
NEUTROS ABS: 2.8 10*3/uL (ref 1.7–7.7)
Neutrophils Relative %: 53 % (ref 43–77)
PLATELETS: 186 10*3/uL (ref 150–400)
RBC: 4.11 MIL/uL — AB (ref 4.22–5.81)
RDW: 18.7 % — ABNORMAL HIGH (ref 11.5–15.5)
WBC: 5.3 10*3/uL (ref 4.0–10.5)

## 2014-07-17 LAB — URINE MICROSCOPIC-ADD ON

## 2014-07-17 LAB — TROPONIN I: Troponin I: <0.03 ng/mL (ref <0.031)

## 2014-07-17 NOTE — ED Provider Notes (Signed)
CSN: [id number]     Arrival date & time 07/17/14  2044 History   First MD Initiated Contact with Patient 07/17/14 2054     Chief Complaint  Patient presents with  . Fall  . Extremity Weakness     (Consider location/radiation/quality/duration/timing/severity/associated sxs/prior Treatment) HPI Comments: The patient is an 81 year old male, who presents with a complaint of weakness in his legs which occurred just prior to arrival when he was trying to get up from the table and walked into the living room. He was trying to do this without the assistance of another person or without the assistance of his walker with which she usually ambulates. When he got several steps from the table he felt as though his legs were becoming weak and he gradually collapsed to the ground with the assistance of a family member. The symptoms have been gradually worsening as of late, he has had a slight increase in confusion, there has been no vomiting, fever, chills, cough, shortness of breath, swelling, rashes or diarrhea. The family members report approximately one week of a progressive mild gradually worsening weakness. They state that he has had a prior stroke which has left him with left leg weakness, some left arm weakness but no difficulty with mental status or confusion.  Patient is a 81 y.o. male presenting with fall and extremity weakness. The history is provided by the patient.  Fall  Extremity Weakness    Past Medical History  Diagnosis Date  . Hypertension   . CVA (cerebral vascular accident)   . Dementia   . Prostate cancer   . Cellulitis currently    right foot  . Arthritis    Past Surgical History  Procedure Laterality Date  . Neck surgery    . Back surgery    . Spider bite surgery      on left leg  . Prostate seed implants     Family History  Problem Relation Age of Onset  . Hypotension Mother   . Diabetes type II Mother   . CAD Mother   . Hypotension Father   . CAD Father   .  CAD Sister    History  Substance Use Topics  . Smoking status: Former Smoker -- 15 years  . Smokeless tobacco: Never Used  . Alcohol Use: No     Comment: none in 2 years or more    Review of Systems  Musculoskeletal: Positive for extremity weakness.  All other systems reviewed and are negative.     Allergies  Review of patient's allergies indicates no known allergies.  Home Medications   Prior to Admission medications   Medication Sig Start Date End Date Taking? Authorizing Provider  allopurinol (ZYLOPRIM) 100 MG tablet Take 100 mg by mouth daily.   Yes Historical Provider, MD  amLODipine (NORVASC) 5 MG tablet Take 5 mg by mouth daily.   Yes Historical Provider, MD  aspirin EC 81 MG tablet Take 81 mg by mouth daily.   Yes Historical Provider, MD  divalproex (DEPAKOTE) 250 MG DR tablet Take 250-500 mg by mouth 2 (two) times daily. Take one tablet in the am and 2 tablets in the pm   Yes Historical Provider, MD  donepezil (ARICEPT) 10 MG tablet Take 10 mg by mouth at bedtime.   Yes Historical Provider, MD  gabapentin (NEURONTIN) 800 MG tablet Take 800 mg by mouth 3 (three) times daily.   Yes Historical Provider, MD  hydrochlorothiazide (MICROZIDE) 12.5 MG capsule Take 12.5 mg by  mouth daily.   Yes Historical Provider, MD  irbesartan (AVAPRO) 150 MG tablet Take 150 mg by mouth daily.   Yes Historical Provider, MD  oxybutynin (DITROPAN) 5 MG tablet Take 5 mg by mouth 3 (three) times daily.   Yes Historical Provider, MD  pravastatin (PRAVACHOL) 40 MG tablet Take 40 mg by mouth daily.   Yes Historical Provider, MD  ciprofloxacin (CIPRO) 250 MG tablet Take 1 tablet (250 mg total) by mouth 2 (two) times daily. Patient not taking: Reported on 07/17/2014 02/04/14   Theodis Blaze, MD  hydrALAZINE (APRESOLINE) 10 MG tablet Take 1 tablet (10 mg total) by mouth every 8 (eight) hours. Patient not taking: Reported on 07/17/2014 02/04/14   Theodis Blaze, MD  HYDROcodone-acetaminophen (NORCO/VICODIN)  5-325 MG per tablet Take 1-2 tablets by mouth every 4 (four) hours as needed for moderate pain. Patient not taking: Reported on 07/17/2014 02/04/14   Theodis Blaze, MD   BP 134/58 mmHg  Pulse 50  Temp(Src) 98.8 F (37.1 C) (Oral)  Resp 17  SpO2 94% Physical Exam  Constitutional: He appears well-developed and well-nourished. No distress.  HENT:  Head: Normocephalic and atraumatic.  Mouth/Throat: Oropharynx is clear and moist. No oropharyngeal exudate.  Eyes: Conjunctivae and EOM are normal. Pupils are equal, round, and reactive to light. Right eye exhibits no discharge. Left eye exhibits no discharge. No scleral icterus.  Stye underneath the left eye, left lower lid  Neck: Normal range of motion. Neck supple. No JVD present. No thyromegaly present.  Cardiovascular: Normal rate, regular rhythm, normal heart sounds and intact distal pulses.  Exam reveals no gallop and no friction rub.   No murmur heard. Pulmonary/Chest: Effort normal and breath sounds normal. No respiratory distress. He has no wheezes. He has no rales.  Abdominal: Soft. Bowel sounds are normal. He exhibits no distension and no mass. There is no tenderness.  Musculoskeletal: Normal range of motion. He exhibits no edema or tenderness.  Lymphadenopathy:    He has no cervical adenopathy.  Neurological: He is alert. Coordination normal.  Normal strength in the bilateral upper extremities, normal grip, normal coordination. Cranial nerves III through XII appear to be intact except for mild left facial droop. Left lower extremity with 4 out of 5 strength compared to 5 out of 5 on the right, this is a finding which she has had from prior strokes  Skin: Skin is warm and dry. No rash noted. No erythema.  Psychiatric: He has a normal mood and affect. His behavior is normal.  Nursing note and vitals reviewed.   ED Course  Procedures (including critical care time) Labs Review Labs Reviewed  URINALYSIS, ROUTINE W REFLEX MICROSCOPIC -  Abnormal; Notable for the following:    Leukocytes, UA SMALL (*)    All other components within normal limits  CBC WITH DIFFERENTIAL/PLATELET - Abnormal; Notable for the following:    RBC 4.11 (*)    Hemoglobin 10.5 (*)    HCT 33.1 (*)    MCH 25.5 (*)    RDW 18.7 (*)    All other components within normal limits  COMPREHENSIVE METABOLIC PANEL - Abnormal; Notable for the following:    Glucose, Bld 123 (*)    BUN 30 (*)    Creatinine, Ser 1.59 (*)    Albumin 3.2 (*)    GFR calc non Af Amer 39 (*)    GFR calc Af Amer 45 (*)    All other components within normal limits  URINE MICROSCOPIC-ADD  ON - Abnormal; Notable for the following:    Bacteria, UA FEW (*)    All other components within normal limits  URINE CULTURE  TROPONIN I    Imaging Review Ct Head Wo Contrast  07/17/2014   CLINICAL DATA:  Initial evaluation for bilateral lower extremity weakness and tremors, personal history of stroke  EXAM: CT HEAD WITHOUT CONTRAST  TECHNIQUE: Contiguous axial images were obtained from the base of the skull through the vertex without intravenous contrast.  COMPARISON:  02/01/2014, 10/14/2012  FINDINGS: Severe atrophy. Severe low attenuation in the deep periventricular white matter. Chronic lacunar infarct right basal ganglia. No evidence of acute vascular territory infarct. No evidence of mass. No hemorrhage or extra-axial fluid. Calvarium intact. Visualized portions of the paranasal sinuses well aerated.  IMPRESSION: No acute intracranial abnormality. Severe chronic small vessel ischemic change and chronic lacunar infarct right basal ganglia stable.   Electronically Signed   By: Skipper Cliche M.D.   On: 07/17/2014 21:43     EKG Interpretation   Date/Time:  Monday July 17 2014 22:21:59 EDT Ventricular Rate:  57 PR Interval:  194 QRS Duration: 92 QT Interval:  434 QTC Calculation: 423 R Axis:   43 Text Interpretation:  Sinus bradycardia ECG OTHERWISE WITHIN NORMAL LIMITS  since last  tracing no significant change Confirmed by Shelsie Tijerino  MD, Ajahni Nay  [phone]) on 07/17/2014 10:41:00 PM      MDM   Final diagnoses:  Weakness    At this time the patient's vital signs do not explain his symptoms, I would suspect that he has had either a neurologic event or possibly a metabolic or infectious event. He has had a recent urinary infection, has completed a course of ciprofloxacin, recheck urine, labs, CT scan of the head.  I have discussed the patient's care with the hospitalist, Dr. Barbaraann Faster, he will see the patient in consultation for possible admission versus outpatient therapy, the patient does not have any focal new deficits on exam, CT scan of labs are rather unremarkable, the family was informed that the patient may or may not need admission to the hospital.  Noemi Chapel, MD 07/18/14 [phone]

## 2014-07-17 NOTE — Progress Notes (Signed)
Pt from home getting up from the table when pt had bilateral leg weakness. Daughter at home with pt and assisted pt to the ground. EMS assisted to stand and noticed some bilateral leg tremors. Past CVA with left arm weakness. No LOC. Stroke scale negative. 138/82, 84 16 CBG 144 95-ra

## 2014-07-17 NOTE — ED Notes (Signed)
Bed: WA20 Expected date:  Expected time:  Means of arrival:  Comments: EMS assisted fall, gen weakness

## 2014-07-18 DIAGNOSIS — H01009 Unspecified blepharitis unspecified eye, unspecified eyelid: Secondary | ICD-10-CM

## 2014-07-18 DIAGNOSIS — I10 Essential (primary) hypertension: Secondary | ICD-10-CM

## 2014-07-18 DIAGNOSIS — R531 Weakness: Secondary | ICD-10-CM | POA: Diagnosis not present

## 2014-07-18 DIAGNOSIS — W19XXXA Unspecified fall, initial encounter: Secondary | ICD-10-CM

## 2014-07-18 DIAGNOSIS — F0150 Vascular dementia without behavioral disturbance: Secondary | ICD-10-CM

## 2014-07-18 DIAGNOSIS — H01006 Unspecified blepharitis left eye, unspecified eyelid: Secondary | ICD-10-CM

## 2014-07-18 DIAGNOSIS — Z8673 Personal history of transient ischemic attack (TIA), and cerebral infarction without residual deficits: Secondary | ICD-10-CM | POA: Diagnosis not present

## 2014-07-18 MED ORDER — ERYTHROMYCIN 2 % EX OINT: TOPICAL_OINTMENT | CUTANEOUS | Status: DC

## 2014-07-18 NOTE — ED Provider Notes (Signed)
I discussed case with Dr. Barbaraann Faster who has evaluated the patient.  He does not see a need for admission at this time.  Patient was instructed by Dr. Barbaraann Faster to call in tomorrow for further help with social work and also see their PCP for outpatient placement.  He discussed outpatient follow up for placement and family agrees with plan.  I will refill antibiotic eye ointment for a stye the patient is complaining of.  His VS remain within his normal limits and he is safe for DC.  Everlene Balls, MD 07/18/14 [phone]

## 2014-07-18 NOTE — Discharge Instructions (Signed)
Weakness Mr. Scogin, follow up with your primary care doctor tomorrow for help getting placement for your weakness.  Also, call this hospital and ask for Dr. Barbaraann Faster so he can give you further resources.  Use eye antibiotics as prescribed.  Come back to the ED for any worsening. Thank you. Weakness is a lack of strength. You may feel weak all over your body or just in one part of your body. Weakness can be serious. In some cases, you may need more medical tests. HOME [place] a well-balanced diet.  Try to exercise every day.  Only take medicines as told by your doctor. GET HELP RIGHT AWAY IF:   You cannot do your normal daily activities.  You cannot walk up and down stairs, or you feel very tired when you do so.  You have shortness of breath or chest pain.  You have trouble moving parts of your body.  You have weakness in only one body part or on only one side of the body.  You have a fever.  You have trouble speaking or swallowing.  You cannot control when you pee (urinate) or poop (bowel movement).  You have black or bloody throw up (vomit) or poop.  Your weakness gets worse or spreads to other body parts.  You have new aches or pains. MAKE SURE YOU:   Understand these instructions.  Will watch your condition.  Will get help right away if you are not doing well or get worse. Document Released: 04/03/2008 Document Revised: 10/21/2011 Document Reviewed: 06/20/2011 [facility] Patient Information 2015 [place], [place]. This information is not intended to replace advice given to you by your health care provider. Make sure you discuss any questions you have with your health care provider.

## 2014-07-18 NOTE — Consult Note (Signed)
[facility] Consult note  Manuel King BJS:[id number] DOB: 28-Feb-1930 DOA: 07/17/2014  Referring physician: Dr Sabra Heck Gabriel Cirri PCP: Merrilee Seashore, MD   Chief Complaint: Fall  HPI: Manuel King is a 81 y.o. male  Patient fell today while trying to get up from the table to go into his living room. Patient was assisted in this attempt and did not have his walker at the time. Patient states that after a couple of steps his leg started to become very weak and slowly fell to the ground. Family noticed this and were able to assist him slowly to the floor. Patient states that over the last several weeks there is been a slow progression in his overall weakness. Denies unilateral weakness, slurred speech, headache, chest pain, palpitations, syncope, fevers, shortness of breath, palpitations, lightheadedness, dizziness. No head trauma.. Patient is attended in the emergency room by his 3 daughters who care for him. Patient lives in his home by himself. Family reports the patient as of late has been eating and drinking very little at main meals and is primarily snacking on crackers. Patient with baseline left sided weakness from previous stroke.   Review of Systems:  Constitutional: No weight loss, night sweats, Fevers, chills, HEENT:  No headaches, Difficulty swallowing,Tooth/dental problems,Sore throat,  No sneezing, itching, ear ache, nasal congestion, post nasal drip,  Cardio-vascular:  No chest pain, Orthopnea, PND, swelling in lower extremities, anasarca, dizziness, palpitations  GI:  No heartburn, indigestion, abdominal pain, nausea, vomiting, diarrhea, change in bowel habits, Resp:   No shortness of breath with exertion or at rest. No excess mucus, no productive cough, No non-productive cough, No coughing up of blood.No change in color of mucus.No wheezing.No chest wall deformity  Skin:  no rash or lesions.  GU:  no dysuria, change in color of urine, no urgency or frequency. No  flank pain.  Musculoskeletal:   No joint pain or swelling.No back pain.  Psych:  No change in mood or affect. No depression or anxiety. No memory loss.   Past Medical History  Diagnosis Date  . Hypertension   . CVA (cerebral vascular accident)   . Dementia   . Prostate cancer   . Cellulitis currently    right foot  . Arthritis    Past Surgical History  Procedure Laterality Date  . Neck surgery    . Back surgery    . Spider bite surgery      on left leg  . Prostate seed implants     Social History:  reports that he has quit smoking. He has never used smokeless tobacco. He reports that he does not drink alcohol or use illicit drugs.  No Known Allergies  Family History  Problem Relation Age of Onset  . Hypotension Mother   . Diabetes type II Mother   . CAD Mother   . Hypotension Father   . CAD Father   . CAD Sister      Prior to Admission medications   Medication Sig Start Date End Date Taking? Authorizing Provider  allopurinol (ZYLOPRIM) 100 MG tablet Take 100 mg by mouth daily.   Yes Historical Provider, MD  amLODipine (NORVASC) 5 MG tablet Take 5 mg by mouth daily.   Yes Historical Provider, MD  aspirin EC 81 MG tablet Take 81 mg by mouth daily.   Yes Historical Provider, MD  divalproex (DEPAKOTE) 250 MG DR tablet Take 250-500 mg by mouth 2 (two) times daily. Take one tablet in the am and 2  tablets in the pm   Yes Historical Provider, MD  donepezil (ARICEPT) 10 MG tablet Take 10 mg by mouth at bedtime.   Yes Historical Provider, MD  gabapentin (NEURONTIN) 800 MG tablet Take 800 mg by mouth 3 (three) times daily.   Yes Historical Provider, MD  hydrochlorothiazide (MICROZIDE) 12.5 MG capsule Take 12.5 mg by mouth daily.   Yes Historical Provider, MD  irbesartan (AVAPRO) 150 MG tablet Take 150 mg by mouth daily.   Yes Historical Provider, MD  oxybutynin (DITROPAN) 5 MG tablet Take 5 mg by mouth 3 (three) times daily.   Yes Historical Provider, MD  pravastatin (PRAVACHOL)  40 MG tablet Take 40 mg by mouth daily.   Yes Historical Provider, MD  ciprofloxacin (CIPRO) 250 MG tablet Take 1 tablet (250 mg total) by mouth 2 (two) times daily. Patient not taking: Reported on 07/17/2014 02/04/14   Theodis Blaze, MD  Erythromycin 2 % ointment Apply to affected area 2 times daily 07/18/14   Everlene Balls, MD  hydrALAZINE (APRESOLINE) 10 MG tablet Take 1 tablet (10 mg total) by mouth every 8 (eight) hours. Patient not taking: Reported on 07/17/2014 02/04/14   Theodis Blaze, MD  HYDROcodone-acetaminophen (NORCO/VICODIN) 5-325 MG per tablet Take 1-2 tablets by mouth every 4 (four) hours as needed for moderate pain. Patient not taking: Reported on 07/17/2014 02/04/14   Theodis Blaze, MD   Physical Exam: Filed Vitals:   07/17/14 2046 07/17/14 2051 07/17/14 2313  BP: 150/69  134/58  Pulse: 73  50  Temp: 98.8 F (37.1 C)    TempSrc: Oral    Resp: 14  17  SpO2: 94% 95% 94%    Wt Readings from Last 3 Encounters:  02/02/14 103.057 kg (227 lb 3.2 oz)  09/12/11 91 kg (200 lb 9.9 oz)  05/10/11 95.709 kg (211 lb)    General:  Appears calm and comfortable Eyes: Left lower eyelid swollen without tenderness in central area of marked swelling and erythema without purulence., EOMI, PERRLA, conjunctiva normal.  ENT:  grossly normal hearing, lips & tongue Neck:  no LAD, masses or thyromegaly Cardiovascular:  RRR, II/VI systolic murmur. No LE edema. Telemetry:  SR, no arrhythmias  Respiratory:  CTA bilaterally, no w/r/r. Normal respiratory effort. Abdomen:  soft, ntnd Skin:  no rash or induration seen on limited exam Musculoskeletal: 4-5 strength right upper extremity, 3 out of 5 left upper extremity strength 4-5 right lower extremity should 3 out of 5 left lower extremity strength, no edema/effusions Psychiatric: Alert and oriented to person. Answers questions properly. Participate. Neurologic: Cranial nerves II through XII intact, moves all extremities in coordinated fashion. Able to  sit unassisted.          Labs on Admission:  Basic Metabolic Panel:  Recent Labs Lab 07/17/14 2149  NA 138  K 3.7  CL 108  CO2 21  GLUCOSE 123*  BUN 30*  CREATININE 1.59*  CALCIUM 8.5   Liver Function Tests:  Recent Labs Lab 07/17/14 2149  AST 15  ALT 8  ALKPHOS 61  BILITOT 0.5  PROT 6.9  ALBUMIN 3.2*   No results for input(s): LIPASE, AMYLASE in the last 168 hours. No results for input(s): AMMONIA in the last 168 hours. CBC:  Recent Labs Lab 07/17/14 2149  WBC 5.3  NEUTROABS 2.8  HGB 10.5*  HCT 33.1*  MCV 80.5  PLT 186   Cardiac Enzymes:  Recent Labs Lab 07/17/14 2149  TROPONINI <0.03    BNP (last  3 results) No results for input(s): BNP in the last 8760 hours.  ProBNP (last 3 results) No results for input(s): PROBNP in the last 8760 hours.  CBG: No results for input(s): GLUCAP in the last 168 hours.  Radiological Exams on Admission: Ct Head Wo Contrast  07/17/2014   CLINICAL DATA:  Initial evaluation for bilateral lower extremity weakness and tremors, personal history of stroke  EXAM: CT HEAD WITHOUT CONTRAST  TECHNIQUE: Contiguous axial images were obtained from the base of the skull through the vertex without intravenous contrast.  COMPARISON:  02/01/2014, 10/14/2012  FINDINGS: Severe atrophy. Severe low attenuation in the deep periventricular white matter. Chronic lacunar infarct right basal ganglia. No evidence of acute vascular territory infarct. No evidence of mass. No hemorrhage or extra-axial fluid. Calvarium intact. Visualized portions of the paranasal sinuses well aerated.  IMPRESSION: No acute intracranial abnormality. Severe chronic small vessel ischemic change and chronic lacunar infarct right basal ganglia stable.   Electronically Signed   By: Skipper Cliche M.D.   On: 07/17/2014 21:43    EKG: Independently reviewed. Sinus, no sign of ACS  Assessment/Plan Active Problems:   Essential hypertension   History of CVA (cerebrovascular  accident)   Vascular dementia   Generalized weakness   Fall   Blepharitis  Consultation by emergency room staff to come and evaluate the patient for generalized weakness and fall. Of note patient lives at home but has significant family support. Patient with history of dementia and is expressing progressive loss of appetite and weakness. Patient's fall today seems mechanical in nature without any signs or symptoms of seizure, ortho stasis, arrhythmia, CVA. There is no apparent infection after reviewing patient's history and lab work here in the emergency room. Patient benefited significantly in the past from physical therapy and I would strongly recommend that the patient seek for this again. The family states that the physical therapy benefited the patient until the patient decided not to do his exercises anymore. The family was encouraged to continue to to encourage patient to eat healthfully and to perform home exercises. Reiterated that this is likely progression of patient's dementia and that there is no sign of acute illness. Of note patient seems to have an understanding of his medical condition but is very independent and does not wish to have significant medical intervention or be placed in a nursing home. This would be a very difficult matter which will need to be worked up between the patient and the family. I would recommend a social work consult unfortunately social work is not available at the time. If possible I will try to have them contact the patient's family tomorrow to see if there additional benefit secondly provided.  Left eye blepharitis is improving though this has been an ongoing issue for several weeks. We will refill the anabolic ointment for the patient at this time and he will continue to do warm compresses and follow-up with his primary care physician or an ophthalmologist if needed.  Patient to continue all other medical regimens. Family Communication: 3 daughters and other  family members Disposition Plan: DC home  Solae Norling Lenna Sciara, MD Family Medicine [facility] [email or web address] Password [email or web address]

## 2014-07-20 LAB — URINE CULTURE
Colony Count: 70000
Special Requests: NORMAL

## 2014-07-21 NOTE — Progress Notes (Signed)
ED Antimicrobial Stewardship Positive Culture Follow Up   Manuel King is an 81 y.o. male who presented to [facility] on 07/17/2014 with a chief complaint of  Chief Complaint  Patient presents with  . Fall  . Extremity Weakness    Recent Results (from the past 720 hour(s))  Urine culture     Status: None   Collection Time: 07/17/14 10:29 PM  Result Value Ref Range Status   Specimen Description URINE, CATHETERIZED  Final   Special Requests Normal  Final   Colony Count   Final    70,000 COLONIES/ML Performed at [facility]    Culture   Final    ENTEROCOCCUS SPECIES Performed at [facility]    Report Status 07/20/2014 FINAL  Final   Organism ID, Bacteria ENTEROCOCCUS SPECIES  Final      Susceptibility   Enterococcus species - MIC*    AMPICILLIN <=2 SENSITIVE Sensitive     LEVOFLOXACIN >=8 RESISTANT Resistant     NITROFURANTOIN <=16 SENSITIVE Sensitive     VANCOMYCIN 1 SENSITIVE Sensitive     TETRACYCLINE >=16 RESISTANT Resistant     * ENTEROCOCCUS SPECIES    [x]  Patient discharged originally without antimicrobial agent and treatment is now indicated 81 yo who came in with sore throat. Now cx positive for strep.  New antibiotic prescription:   Amoxicillin 500mg  PO BID x 10 days  ED Provider: Glendell Docker, NP  Onnie Boer, PharmD Pager: [phone] Infectious Diseases Pharmacist Phone# [phone]

## 2014-07-28 DIAGNOSIS — R2689 Other abnormalities of gait and mobility: Secondary | ICD-10-CM | POA: Diagnosis not present

## 2014-07-28 DIAGNOSIS — M150 Primary generalized (osteo)arthritis: Secondary | ICD-10-CM | POA: Diagnosis not present

## 2014-07-28 DIAGNOSIS — R531 Weakness: Secondary | ICD-10-CM | POA: Diagnosis not present

## 2014-07-28 DIAGNOSIS — F0391 Unspecified dementia with behavioral disturbance: Secondary | ICD-10-CM | POA: Diagnosis not present

## 2014-07-31 DIAGNOSIS — R531 Weakness: Secondary | ICD-10-CM | POA: Diagnosis not present

## 2014-07-31 DIAGNOSIS — M150 Primary generalized (osteo)arthritis: Secondary | ICD-10-CM | POA: Diagnosis not present

## 2014-07-31 DIAGNOSIS — F0391 Unspecified dementia with behavioral disturbance: Secondary | ICD-10-CM | POA: Diagnosis not present

## 2014-07-31 DIAGNOSIS — R2689 Other abnormalities of gait and mobility: Secondary | ICD-10-CM | POA: Diagnosis not present

## 2014-08-02 DIAGNOSIS — M150 Primary generalized (osteo)arthritis: Secondary | ICD-10-CM | POA: Diagnosis not present

## 2014-08-02 DIAGNOSIS — R531 Weakness: Secondary | ICD-10-CM | POA: Diagnosis not present

## 2014-08-02 DIAGNOSIS — F0391 Unspecified dementia with behavioral disturbance: Secondary | ICD-10-CM | POA: Diagnosis not present

## 2014-08-02 DIAGNOSIS — R2689 Other abnormalities of gait and mobility: Secondary | ICD-10-CM | POA: Diagnosis not present

## 2014-08-04 DIAGNOSIS — M150 Primary generalized (osteo)arthritis: Secondary | ICD-10-CM | POA: Diagnosis not present

## 2014-08-04 DIAGNOSIS — R531 Weakness: Secondary | ICD-10-CM | POA: Diagnosis not present

## 2014-08-04 DIAGNOSIS — R2689 Other abnormalities of gait and mobility: Secondary | ICD-10-CM | POA: Diagnosis not present

## 2014-08-04 DIAGNOSIS — F0391 Unspecified dementia with behavioral disturbance: Secondary | ICD-10-CM | POA: Diagnosis not present

## 2014-08-07 DIAGNOSIS — M150 Primary generalized (osteo)arthritis: Secondary | ICD-10-CM | POA: Diagnosis not present

## 2014-08-07 DIAGNOSIS — F0391 Unspecified dementia with behavioral disturbance: Secondary | ICD-10-CM | POA: Diagnosis not present

## 2014-08-07 DIAGNOSIS — R2689 Other abnormalities of gait and mobility: Secondary | ICD-10-CM | POA: Diagnosis not present

## 2014-08-07 DIAGNOSIS — R531 Weakness: Secondary | ICD-10-CM | POA: Diagnosis not present

## 2014-08-10 DIAGNOSIS — R531 Weakness: Secondary | ICD-10-CM | POA: Diagnosis not present

## 2014-08-10 DIAGNOSIS — R2689 Other abnormalities of gait and mobility: Secondary | ICD-10-CM | POA: Diagnosis not present

## 2014-08-10 DIAGNOSIS — M150 Primary generalized (osteo)arthritis: Secondary | ICD-10-CM | POA: Diagnosis not present

## 2014-08-10 DIAGNOSIS — F0391 Unspecified dementia with behavioral disturbance: Secondary | ICD-10-CM | POA: Diagnosis not present

## 2014-08-11 DIAGNOSIS — M150 Primary generalized (osteo)arthritis: Secondary | ICD-10-CM | POA: Diagnosis not present

## 2014-08-11 DIAGNOSIS — R531 Weakness: Secondary | ICD-10-CM | POA: Diagnosis not present

## 2014-08-11 DIAGNOSIS — F0391 Unspecified dementia with behavioral disturbance: Secondary | ICD-10-CM | POA: Diagnosis not present

## 2014-08-11 DIAGNOSIS — R2689 Other abnormalities of gait and mobility: Secondary | ICD-10-CM | POA: Diagnosis not present

## 2014-08-15 DIAGNOSIS — R531 Weakness: Secondary | ICD-10-CM | POA: Diagnosis not present

## 2014-08-15 DIAGNOSIS — F0391 Unspecified dementia with behavioral disturbance: Secondary | ICD-10-CM | POA: Diagnosis not present

## 2014-08-15 DIAGNOSIS — M150 Primary generalized (osteo)arthritis: Secondary | ICD-10-CM | POA: Diagnosis not present

## 2014-08-15 DIAGNOSIS — R2689 Other abnormalities of gait and mobility: Secondary | ICD-10-CM | POA: Diagnosis not present

## 2014-08-17 DIAGNOSIS — R2689 Other abnormalities of gait and mobility: Secondary | ICD-10-CM | POA: Diagnosis not present

## 2014-08-17 DIAGNOSIS — M150 Primary generalized (osteo)arthritis: Secondary | ICD-10-CM | POA: Diagnosis not present

## 2014-08-17 DIAGNOSIS — R531 Weakness: Secondary | ICD-10-CM | POA: Diagnosis not present

## 2014-08-17 DIAGNOSIS — F0391 Unspecified dementia with behavioral disturbance: Secondary | ICD-10-CM | POA: Diagnosis not present

## 2014-08-22 DIAGNOSIS — F0391 Unspecified dementia with behavioral disturbance: Secondary | ICD-10-CM | POA: Diagnosis not present

## 2014-08-22 DIAGNOSIS — R2689 Other abnormalities of gait and mobility: Secondary | ICD-10-CM | POA: Diagnosis not present

## 2014-08-22 DIAGNOSIS — M150 Primary generalized (osteo)arthritis: Secondary | ICD-10-CM | POA: Diagnosis not present

## 2014-08-22 DIAGNOSIS — R531 Weakness: Secondary | ICD-10-CM | POA: Diagnosis not present

## 2014-08-25 DIAGNOSIS — R531 Weakness: Secondary | ICD-10-CM | POA: Diagnosis not present

## 2014-08-25 DIAGNOSIS — M150 Primary generalized (osteo)arthritis: Secondary | ICD-10-CM | POA: Diagnosis not present

## 2014-08-25 DIAGNOSIS — F0391 Unspecified dementia with behavioral disturbance: Secondary | ICD-10-CM | POA: Diagnosis not present

## 2014-08-25 DIAGNOSIS — R2689 Other abnormalities of gait and mobility: Secondary | ICD-10-CM | POA: Diagnosis not present

## 2014-08-29 DIAGNOSIS — R531 Weakness: Secondary | ICD-10-CM | POA: Diagnosis not present

## 2014-08-29 DIAGNOSIS — F0391 Unspecified dementia with behavioral disturbance: Secondary | ICD-10-CM | POA: Diagnosis not present

## 2014-08-29 DIAGNOSIS — M150 Primary generalized (osteo)arthritis: Secondary | ICD-10-CM | POA: Diagnosis not present

## 2014-08-29 DIAGNOSIS — R2689 Other abnormalities of gait and mobility: Secondary | ICD-10-CM | POA: Diagnosis not present

## 2014-08-31 DIAGNOSIS — R531 Weakness: Secondary | ICD-10-CM | POA: Diagnosis not present

## 2014-08-31 DIAGNOSIS — M150 Primary generalized (osteo)arthritis: Secondary | ICD-10-CM | POA: Diagnosis not present

## 2014-08-31 DIAGNOSIS — F0391 Unspecified dementia with behavioral disturbance: Secondary | ICD-10-CM | POA: Diagnosis not present

## 2014-08-31 DIAGNOSIS — R2689 Other abnormalities of gait and mobility: Secondary | ICD-10-CM | POA: Diagnosis not present

## 2014-09-04 DIAGNOSIS — R531 Weakness: Secondary | ICD-10-CM | POA: Diagnosis not present

## 2014-09-04 DIAGNOSIS — F0391 Unspecified dementia with behavioral disturbance: Secondary | ICD-10-CM | POA: Diagnosis not present

## 2014-09-04 DIAGNOSIS — R2689 Other abnormalities of gait and mobility: Secondary | ICD-10-CM | POA: Diagnosis not present

## 2014-09-04 DIAGNOSIS — M150 Primary generalized (osteo)arthritis: Secondary | ICD-10-CM | POA: Diagnosis not present

## 2014-09-29 ENCOUNTER — Emergency Department (HOSPITAL_COMMUNITY)
Admission: EM | Admit: 2014-09-29 | Discharge: 2014-09-29 | Disposition: A | Discharge status: Home / Self Care | Payer: Medicare Other | Attending: Emergency Medicine | Admitting: Emergency Medicine

## 2014-09-29 ENCOUNTER — Encounter (HOSPITAL_COMMUNITY): Payer: Self-pay | Admitting: *Deleted

## 2014-09-29 ENCOUNTER — Emergency Department (HOSPITAL_COMMUNITY): Payer: Medicare Other

## 2014-09-29 DIAGNOSIS — I10 Essential (primary) hypertension: Secondary | ICD-10-CM | POA: Insufficient documentation

## 2014-09-29 DIAGNOSIS — N390 Urinary tract infection, site not specified: Secondary | ICD-10-CM | POA: Diagnosis not present

## 2014-09-29 DIAGNOSIS — R404 Transient alteration of awareness: Secondary | ICD-10-CM | POA: Diagnosis not present

## 2014-09-29 DIAGNOSIS — R531 Weakness: Secondary | ICD-10-CM | POA: Diagnosis not present

## 2014-09-29 DIAGNOSIS — Z872 Personal history of diseases of the skin and subcutaneous tissue: Secondary | ICD-10-CM | POA: Insufficient documentation

## 2014-09-29 DIAGNOSIS — Z87891 Personal history of nicotine dependence: Secondary | ICD-10-CM | POA: Diagnosis not present

## 2014-09-29 DIAGNOSIS — F0390 Unspecified dementia without behavioral disturbance: Secondary | ICD-10-CM | POA: Insufficient documentation

## 2014-09-29 DIAGNOSIS — Z79899 Other long term (current) drug therapy: Secondary | ICD-10-CM | POA: Insufficient documentation

## 2014-09-29 DIAGNOSIS — Z8673 Personal history of transient ischemic attack (TIA), and cerebral infarction without residual deficits: Secondary | ICD-10-CM | POA: Insufficient documentation

## 2014-09-29 DIAGNOSIS — S0990XA Unspecified injury of head, initial encounter: Secondary | ICD-10-CM | POA: Diagnosis not present

## 2014-09-29 DIAGNOSIS — Z8546 Personal history of malignant neoplasm of prostate: Secondary | ICD-10-CM | POA: Insufficient documentation

## 2014-09-29 DIAGNOSIS — B9689 Other specified bacterial agents as the cause of diseases classified elsewhere: Secondary | ICD-10-CM | POA: Diagnosis not present

## 2014-09-29 DIAGNOSIS — M1990 Unspecified osteoarthritis, unspecified site: Secondary | ICD-10-CM | POA: Diagnosis not present

## 2014-09-29 DIAGNOSIS — A499 Bacterial infection, unspecified: Secondary | ICD-10-CM | POA: Insufficient documentation

## 2014-09-29 DIAGNOSIS — Z7982 Long term (current) use of aspirin: Secondary | ICD-10-CM | POA: Diagnosis not present

## 2014-09-29 LAB — CBC
HCT: 31.1 % — ABNORMAL LOW (ref 39.0–52.0)
Hemoglobin: 10.1 g/dL — ABNORMAL LOW (ref 13.0–17.0)
MCH: 25.3 pg — AB (ref 26.0–34.0)
MCHC: 32.5 g/dL (ref 30.0–36.0)
MCV: 77.9 fL — ABNORMAL LOW (ref 78.0–100.0)
Platelets: 164 10*3/uL (ref 150–400)
RBC: 3.99 MIL/uL — ABNORMAL LOW (ref 4.22–5.81)
RDW: 19.3 % — ABNORMAL HIGH (ref 11.5–15.5)
WBC: 5.4 10*3/uL (ref 4.0–10.5)

## 2014-09-29 LAB — URINALYSIS, ROUTINE W REFLEX MICROSCOPIC
BILIRUBIN URINE: NEGATIVE
Glucose, UA: NEGATIVE mg/dL
HGB URINE DIPSTICK: NEGATIVE
KETONES UR: NEGATIVE mg/dL
Nitrite: NEGATIVE
PROTEIN: NEGATIVE mg/dL
SPECIFIC GRAVITY, URINE: 1.016 (ref 1.005–1.030)
UROBILINOGEN UA: 0.2 mg/dL (ref 0.0–1.0)
pH: 5 (ref 5.0–8.0)

## 2014-09-29 LAB — URINE MICROSCOPIC-ADD ON

## 2014-09-29 LAB — COMPREHENSIVE METABOLIC PANEL
ALK PHOS: 59 U/L (ref 38–126)
ALT: 8 U/L — ABNORMAL LOW (ref 17–63)
ANION GAP: 11 (ref 5–15)
AST: 12 U/L — AB (ref 15–41)
Albumin: 3 g/dL — ABNORMAL LOW (ref 3.5–5.0)
BUN: 22 mg/dL — ABNORMAL HIGH (ref 6–20)
CALCIUM: 8.5 mg/dL — AB (ref 8.9–10.3)
CO2: 19 mmol/L — AB (ref 22–32)
Chloride: 107 mmol/L (ref 101–111)
Creatinine, Ser: 1.55 mg/dL — ABNORMAL HIGH (ref 0.61–1.24)
GFR calc Af Amer: 46 mL/min — ABNORMAL LOW (ref >60)
GFR calc non Af Amer: 40 mL/min — ABNORMAL LOW (ref >60)
Glucose, Bld: 98 mg/dL (ref 65–99)
POTASSIUM: 4.3 mmol/L (ref 3.5–5.1)
SODIUM: 137 mmol/L (ref 135–145)
Total Bilirubin: 0.4 mg/dL (ref 0.3–1.2)
Total Protein: 6.8 g/dL (ref 6.5–8.1)

## 2014-09-29 LAB — VALPROIC ACID LEVEL: VALPROIC ACID LVL: 59 ug/mL (ref 50.0–100.0)

## 2014-09-29 MED ORDER — CIPROFLOXACIN HCL 500 MG PO TABS: 500.0000 mg | ORAL_TABLET | Freq: Two times a day (BID) | ORAL | Status: DC

## 2014-09-29 MED ORDER — ERYTHROMYCIN 5 MG/GM OP OINT: TOPICAL_OINTMENT | OPHTHALMIC | Status: DC

## 2014-09-29 NOTE — Discharge Instructions (Signed)
Cipro as prescribed.  Ilotycin ointment 3 times daily in the left eye. Also be sure to perform frequent warm soaks to the eyelid.   Urinary Tract Infection Urinary tract infections (UTIs) can develop anywhere along your urinary tract. Your urinary tract is your body's drainage system for removing wastes and extra water. Your urinary tract includes two kidneys, two ureters, a bladder, and a urethra. Your kidneys are a pair of bean-shaped organs. Each kidney is about the size of your fist. They are located below your ribs, one on each side of your spine. CAUSES Infections are caused by microbes, which are microscopic organisms, including fungi, viruses, and bacteria. These organisms are so small that they can only be seen through a microscope. Bacteria are the microbes that most commonly cause UTIs. SYMPTOMS  Symptoms of UTIs may vary by age and gender of the patient and by the location of the infection. Symptoms in young women typically include a frequent and intense urge to urinate and a painful, burning feeling in the bladder or urethra during urination. Older women and men are more likely to be tired, shaky, and weak and have muscle aches and abdominal pain. A fever may mean the infection is in your kidneys. Other symptoms of a kidney infection include pain in your back or sides below the ribs, nausea, and vomiting. DIAGNOSIS To diagnose a UTI, your caregiver will ask you about your symptoms. Your caregiver also will ask to provide a urine sample. The urine sample will be tested for bacteria and white blood cells. White blood cells are made by your body to help fight infection. TREATMENT  Typically, UTIs can be treated with medication. Because most UTIs are caused by a bacterial infection, they usually can be treated with the use of antibiotics. The choice of antibiotic and length of treatment depend on your symptoms and the type of bacteria causing your infection. HOME CARE INSTRUCTIONS  If you  were prescribed antibiotics, take them exactly as your caregiver instructs you. Finish the medication even if you feel better after you have only taken some of the medication.  Drink enough water and fluids to keep your urine clear or pale yellow.  Avoid caffeine, tea, and carbonated beverages. They tend to irritate your bladder.  Empty your bladder often. Avoid holding urine for long periods of time.  Empty your bladder before and after sexual intercourse.  After a bowel movement, women should cleanse from front to back. Use each tissue only once. SEEK MEDICAL CARE IF:   You have back pain.  You develop a fever.  Your symptoms do not begin to resolve within 3 days. SEEK IMMEDIATE MEDICAL CARE IF:   You have severe back pain or lower abdominal pain.  You develop chills.  You have nausea or vomiting.  You have continued burning or discomfort with urination. MAKE SURE YOU:   Understand these instructions.  Will watch your condition.  Will get help right away if you are not doing well or get worse. Document Released: 01/29/2005 Document Revised: 10/21/2011 Document Reviewed: 05/30/2011 [facility] Patient Information 2015 [place], [place]. This information is not intended to replace advice given to you by your health care provider. Make sure you discuss any questions you have with your health care provider.

## 2014-09-29 NOTE — ED Provider Notes (Signed)
CSN: [id number]     Arrival date & time 09/29/14  1252 History   First MD Initiated Contact with Patient 09/29/14 1557     Chief Complaint  Patient presents with  . Weakness     (Consider location/radiation/quality/duration/timing/severity/associated sxs/prior Treatment) HPI Comments: Patient is an 81 year old male with history of vascular dementia, high cholesterol, hypertension, recurrent UTIs. He presents for evaluation of weakness that has been worsening over the past week. Family member at bedside states that he has had urinary tract infections that present in the same fashion. The patient has no symptoms, however has never had symptoms with his UTIs before there there've been no fevers or chills. He denies any nausea, vomiting, or diarrhea. He denies any other complaints. He did have a fall one week ago after he became very weak, but denies any specific injury.  Patient is a 81 y.o. male presenting with weakness. The history is provided by the patient.  Weakness This is a recurrent problem. Episode onset: One week ago. The problem occurs constantly. The problem has been gradually worsening. Pertinent negatives include no chest pain, no abdominal pain, no headaches and no shortness of breath. Nothing aggravates the symptoms. Nothing relieves the symptoms. He has tried nothing for the symptoms. The treatment provided no relief.    Past Medical History  Diagnosis Date  . Hypertension   . CVA (cerebral vascular accident)   . Dementia   . Prostate cancer   . Cellulitis currently    right foot  . Arthritis    Past Surgical History  Procedure Laterality Date  . Neck surgery    . Back surgery    . Spider bite surgery      on left leg  . Prostate seed implants     Family History  Problem Relation Age of Onset  . Hypotension Mother   . Diabetes type II Mother   . CAD Mother   . Hypotension Father   . CAD Father   . CAD Sister    History  Substance Use Topics  . Smoking  status: Former Smoker -- 15 years  . Smokeless tobacco: Never Used  . Alcohol Use: No     Comment: none in 2 years or more    Review of Systems  Constitutional: Positive for fatigue.  Respiratory: Negative for shortness of breath.   Cardiovascular: Negative for chest pain.  Gastrointestinal: Negative for abdominal pain.  Neurological: Positive for weakness. Negative for headaches.  All other systems reviewed and are negative.     Allergies  Review of patient's allergies indicates no known allergies.  Home Medications   Prior to Admission medications   Medication Sig Start Date End Date Taking? Authorizing Provider  allopurinol (ZYLOPRIM) 100 MG tablet Take 100 mg by mouth daily.    Historical Provider, MD  amLODipine (NORVASC) 5 MG tablet Take 5 mg by mouth daily.    Historical Provider, MD  aspirin EC 81 MG tablet Take 81 mg by mouth daily.    Historical Provider, MD  ciprofloxacin (CIPRO) 250 MG tablet Take 1 tablet (250 mg total) by mouth 2 (two) times daily. Patient not taking: Reported on 07/17/2014 02/04/14   Theodis Blaze, MD  divalproex (DEPAKOTE) 250 MG DR tablet Take 250-500 mg by mouth 2 (two) times daily. Take one tablet in the am and 2 tablets in the pm    Historical Provider, MD  donepezil (ARICEPT) 10 MG tablet Take 10 mg by mouth at bedtime.  Historical Provider, MD  Erythromycin 2 % ointment Apply to affected area 2 times daily 07/18/14   Everlene Balls, MD  gabapentin (NEURONTIN) 800 MG tablet Take 800 mg by mouth 3 (three) times daily.    Historical Provider, MD  hydrALAZINE (APRESOLINE) 10 MG tablet Take 1 tablet (10 mg total) by mouth every 8 (eight) hours. Patient not taking: Reported on 07/17/2014 02/04/14   Theodis Blaze, MD  hydrochlorothiazide (MICROZIDE) 12.5 MG capsule Take 12.5 mg by mouth daily.    Historical Provider, MD  HYDROcodone-acetaminophen (NORCO/VICODIN) 5-325 MG per tablet Take 1-2 tablets by mouth every 4 (four) hours as needed for moderate  pain. Patient not taking: Reported on 07/17/2014 02/04/14   Theodis Blaze, MD  irbesartan (AVAPRO) 150 MG tablet Take 150 mg by mouth daily.    Historical Provider, MD  oxybutynin (DITROPAN) 5 MG tablet Take 5 mg by mouth 3 (three) times daily.    Historical Provider, MD  pravastatin (PRAVACHOL) 40 MG tablet Take 40 mg by mouth daily.    Historical Provider, MD   BP 145/57 mmHg  Pulse 63  Temp(Src) 99.2 F (37.3 C) (Oral)  Resp 16  SpO2 96% Physical Exam  Constitutional: He is oriented to person, place, and time. He appears well-developed and well-nourished. No distress.  Patient is an elderly male in no acute distress. He is somewhat slow to respond, however this is his baseline.  HENT:  Head: Normocephalic and atraumatic.  Mouth/Throat: Oropharynx is clear and moist.  Eyes: EOM are normal. Pupils are equal, round, and reactive to light.  He has what appears to be a stye to the left lower eyelid.  Neck: Normal range of motion. Neck supple.  Cardiovascular: Normal rate, regular rhythm and normal heart sounds.   No murmur heard. Pulmonary/Chest: Effort normal and breath sounds normal. No respiratory distress. He has no wheezes.  Abdominal: Soft. Bowel sounds are normal. He exhibits no distension. There is no tenderness.  Musculoskeletal: Normal range of motion. He exhibits edema.  There is trace bilateral lower extremity edema.  Lymphadenopathy:    He has no cervical adenopathy.  Neurological: He is alert and oriented to person, place, and time. No cranial nerve deficit. He exhibits normal muscle tone. Coordination normal.  Skin: Skin is warm and dry. He is not diaphoretic.  Nursing note and vitals reviewed.   ED Course  Procedures (including critical care time) Labs Review Labs Reviewed  CBC - Abnormal; Notable for the following:    RBC 3.99 (*)    Hemoglobin 10.1 (*)    HCT 31.1 (*)    MCV 77.9 (*)    MCH 25.3 (*)    RDW 19.3 (*)    All other components within normal  limits  COMPREHENSIVE METABOLIC PANEL - Abnormal; Notable for the following:    CO2 19 (*)    BUN 22 (*)    Creatinine, Ser 1.55 (*)    Calcium 8.5 (*)    Albumin 3.0 (*)    AST 12 (*)    ALT 8 (*)    GFR calc non Af Amer 40 (*)    GFR calc Af Amer 46 (*)    All other components within normal limits  URINALYSIS, ROUTINE W REFLEX MICROSCOPIC (NOT AT [facility])  URINALYSIS, ROUTINE W REFLEX MICROSCOPIC (NOT AT [facility])    Imaging Review No results found.   EKG Interpretation   Date/Time:  Friday Sep 29 2014 16:37:46 EDT Ventricular Rate:  56 PR Interval:  206 QRS Duration: 91 QT Interval:  447 QTC Calculation: 431 R Axis:   51 Text Interpretation:  Sinus rhythm Atrial premature complex Confirmed by  Mercer Stallworth  MD, Etter Royall ([id number]) on 09/29/2014 4:57:53 PM      MDM   Final diagnoses:  None    Patient presents with complaints of weakness. He has a history of recurrent UTIs. His UA today is suggestive of a urinary tract infection. Remainder the workup reveals a normal head CT, therapeutic Depakote level, and laboratory studies which are basically consistent with his baseline. I will treat him for a UTI and he is to return as needed if his symptoms worsen or change.    Veryl Speak, MD 09/29/14 [phone]

## 2014-09-29 NOTE — ED Notes (Signed)
Per EMS- family reports increased bilateral weakness for the last week. Pt has hx of stroke with left side residual. Pt has hx of recurrent UTI as well.

## 2014-10-30 DIAGNOSIS — R634 Abnormal weight loss: Secondary | ICD-10-CM | POA: Diagnosis not present

## 2014-10-30 DIAGNOSIS — F329 Major depressive disorder, single episode, unspecified: Secondary | ICD-10-CM | POA: Diagnosis not present

## 2014-10-30 DIAGNOSIS — R531 Weakness: Secondary | ICD-10-CM | POA: Diagnosis not present

## 2014-12-21 ENCOUNTER — Emergency Department (HOSPITAL_COMMUNITY): Payer: Medicare Other

## 2014-12-21 ENCOUNTER — Encounter (HOSPITAL_COMMUNITY): Payer: Self-pay | Admitting: *Deleted

## 2014-12-21 ENCOUNTER — Inpatient Hospital Stay (HOSPITAL_COMMUNITY)
Admission: EM | Admit: 2014-12-21 | Discharge: 2014-12-25 | DRG: 690 | Disposition: A | Discharge status: Skilled Nursing Facility | Payer: Medicare Other | Attending: Internal Medicine | Admitting: Internal Medicine

## 2014-12-21 DIAGNOSIS — R2681 Unsteadiness on feet: Secondary | ICD-10-CM | POA: Diagnosis not present

## 2014-12-21 DIAGNOSIS — F0390 Unspecified dementia without behavioral disturbance: Secondary | ICD-10-CM | POA: Diagnosis not present

## 2014-12-21 DIAGNOSIS — N390 Urinary tract infection, site not specified: Principal | ICD-10-CM | POA: Diagnosis present

## 2014-12-21 DIAGNOSIS — R262 Difficulty in walking, not elsewhere classified: Secondary | ICD-10-CM | POA: Diagnosis not present

## 2014-12-21 DIAGNOSIS — M25562 Pain in left knee: Secondary | ICD-10-CM | POA: Diagnosis not present

## 2014-12-21 DIAGNOSIS — M7989 Other specified soft tissue disorders: Secondary | ICD-10-CM | POA: Diagnosis not present

## 2014-12-21 DIAGNOSIS — N179 Acute kidney failure, unspecified: Secondary | ICD-10-CM | POA: Diagnosis present

## 2014-12-21 DIAGNOSIS — Z79899 Other long term (current) drug therapy: Secondary | ICD-10-CM | POA: Diagnosis not present

## 2014-12-21 DIAGNOSIS — Z7982 Long term (current) use of aspirin: Secondary | ICD-10-CM | POA: Diagnosis not present

## 2014-12-21 DIAGNOSIS — I129 Hypertensive chronic kidney disease with stage 1 through stage 4 chronic kidney disease, or unspecified chronic kidney disease: Secondary | ICD-10-CM | POA: Diagnosis present

## 2014-12-21 DIAGNOSIS — S93402A Sprain of unspecified ligament of left ankle, initial encounter: Secondary | ICD-10-CM | POA: Diagnosis not present

## 2014-12-21 DIAGNOSIS — N183 Chronic kidney disease, stage 3 (moderate): Secondary | ICD-10-CM | POA: Diagnosis present

## 2014-12-21 DIAGNOSIS — B9561 Methicillin susceptible Staphylococcus aureus infection as the cause of diseases classified elsewhere: Secondary | ICD-10-CM | POA: Diagnosis not present

## 2014-12-21 DIAGNOSIS — Z8673 Personal history of transient ischemic attack (TIA), and cerebral infarction without residual deficits: Secondary | ICD-10-CM

## 2014-12-21 DIAGNOSIS — R001 Bradycardia, unspecified: Secondary | ICD-10-CM | POA: Diagnosis not present

## 2014-12-21 DIAGNOSIS — M25572 Pain in left ankle and joints of left foot: Secondary | ICD-10-CM | POA: Diagnosis not present

## 2014-12-21 DIAGNOSIS — M79605 Pain in left leg: Secondary | ICD-10-CM | POA: Diagnosis not present

## 2014-12-21 DIAGNOSIS — E785 Hyperlipidemia, unspecified: Secondary | ICD-10-CM | POA: Diagnosis present

## 2014-12-21 DIAGNOSIS — M25669 Stiffness of unspecified knee, not elsewhere classified: Secondary | ICD-10-CM | POA: Diagnosis not present

## 2014-12-21 DIAGNOSIS — R627 Adult failure to thrive: Secondary | ICD-10-CM | POA: Diagnosis present

## 2014-12-21 DIAGNOSIS — Z87891 Personal history of nicotine dependence: Secondary | ICD-10-CM

## 2014-12-21 DIAGNOSIS — Z5189 Encounter for other specified aftercare: Secondary | ICD-10-CM | POA: Diagnosis not present

## 2014-12-21 DIAGNOSIS — S99912A Unspecified injury of left ankle, initial encounter: Secondary | ICD-10-CM | POA: Diagnosis not present

## 2014-12-21 DIAGNOSIS — W19XXXA Unspecified fall, initial encounter: Secondary | ICD-10-CM | POA: Diagnosis not present

## 2014-12-21 DIAGNOSIS — N182 Chronic kidney disease, stage 2 (mild): Secondary | ICD-10-CM | POA: Diagnosis not present

## 2014-12-21 DIAGNOSIS — M1990 Unspecified osteoarthritis, unspecified site: Secondary | ICD-10-CM | POA: Diagnosis not present

## 2014-12-21 DIAGNOSIS — Z9181 History of falling: Secondary | ICD-10-CM | POA: Diagnosis not present

## 2014-12-21 DIAGNOSIS — R2689 Other abnormalities of gait and mobility: Secondary | ICD-10-CM | POA: Diagnosis not present

## 2014-12-21 DIAGNOSIS — I10 Essential (primary) hypertension: Secondary | ICD-10-CM | POA: Diagnosis not present

## 2014-12-21 DIAGNOSIS — R278 Other lack of coordination: Secondary | ICD-10-CM | POA: Diagnosis not present

## 2014-12-21 DIAGNOSIS — F0280 Dementia in other diseases classified elsewhere without behavioral disturbance: Secondary | ICD-10-CM | POA: Diagnosis not present

## 2014-12-21 DIAGNOSIS — Z8546 Personal history of malignant neoplasm of prostate: Secondary | ICD-10-CM

## 2014-12-21 DIAGNOSIS — M6281 Muscle weakness (generalized): Secondary | ICD-10-CM | POA: Diagnosis not present

## 2014-12-21 DIAGNOSIS — S8992XA Unspecified injury of left lower leg, initial encounter: Secondary | ICD-10-CM | POA: Diagnosis not present

## 2014-12-21 DIAGNOSIS — S93402D Sprain of unspecified ligament of left ankle, subsequent encounter: Secondary | ICD-10-CM | POA: Diagnosis not present

## 2014-12-21 LAB — URINALYSIS, ROUTINE W REFLEX MICROSCOPIC
BILIRUBIN URINE: NEGATIVE
Glucose, UA: NEGATIVE mg/dL
Ketones, ur: NEGATIVE mg/dL
Nitrite: POSITIVE — AB
PH: 5.5 (ref 5.0–8.0)
Protein, ur: NEGATIVE mg/dL
Specific Gravity, Urine: 1.013 (ref 1.005–1.030)
UROBILINOGEN UA: 0.2 mg/dL (ref 0.0–1.0)

## 2014-12-21 LAB — BASIC METABOLIC PANEL
Anion gap: 9 (ref 5–15)
BUN: 35 mg/dL — AB (ref 6–20)
CO2: 23 mmol/L (ref 22–32)
CREATININE: 1.69 mg/dL — AB (ref 0.61–1.24)
Calcium: 8.6 mg/dL — ABNORMAL LOW (ref 8.9–10.3)
Chloride: 103 mmol/L (ref 101–111)
GFR calc Af Amer: 41 mL/min — ABNORMAL LOW (ref >60)
GFR, EST NON AFRICAN AMERICAN: 36 mL/min — AB (ref >60)
Glucose, Bld: 97 mg/dL (ref 65–99)
Potassium: 4.8 mmol/L (ref 3.5–5.1)
Sodium: 135 mmol/L (ref 135–145)

## 2014-12-21 LAB — CBC
HCT: 30 % — ABNORMAL LOW (ref 39.0–52.0)
Hemoglobin: 9.5 g/dL — ABNORMAL LOW (ref 13.0–17.0)
MCH: 25.1 pg — ABNORMAL LOW (ref 26.0–34.0)
MCHC: 31.7 g/dL (ref 30.0–36.0)
MCV: 79.4 fL (ref 78.0–100.0)
Platelets: 210 10*3/uL (ref 150–400)
RBC: 3.78 MIL/uL — ABNORMAL LOW (ref 4.22–5.81)
RDW: 20.1 % — AB (ref 11.5–15.5)
WBC: 5.5 10*3/uL (ref 4.0–10.5)

## 2014-12-21 LAB — URINE MICROSCOPIC-ADD ON

## 2014-12-21 MED ORDER — PAROXETINE HCL 20 MG PO TABS
20.0000 mg | ORAL_TABLET | Freq: Every day | ORAL | Status: DC
  Administered 2014-12-22 – 2014-12-25 (×4): 20 mg via ORAL
  Filled 2014-12-21 (×4): qty 1

## 2014-12-21 MED ORDER — SODIUM CHLORIDE 0.9 % IV SOLN
INTRAVENOUS | Status: DC
  Administered 2014-12-21: 16:00:00 via INTRAVENOUS

## 2014-12-21 MED ORDER — OXYBUTYNIN CHLORIDE 5 MG PO TABS
5.0000 mg | ORAL_TABLET | Freq: Three times a day (TID) | ORAL | Status: DC
  Administered 2014-12-21 – 2014-12-25 (×11): 5 mg via ORAL
  Filled 2014-12-21 (×14): qty 1

## 2014-12-21 MED ORDER — ONDANSETRON HCL 4 MG/2ML IJ SOLN: 4.0000 mg | Freq: Four times a day (QID) | INTRAMUSCULAR | Status: DC | PRN

## 2014-12-21 MED ORDER — ALLOPURINOL 100 MG PO TABS
100.0000 mg | ORAL_TABLET | Freq: Every day | ORAL | Status: DC
  Administered 2014-12-22 – 2014-12-25 (×4): 100 mg via ORAL
  Filled 2014-12-21 (×4): qty 1

## 2014-12-21 MED ORDER — DONEPEZIL HCL 10 MG PO TABS
10.0000 mg | ORAL_TABLET | Freq: Every day | ORAL | Status: DC
  Administered 2014-12-21 – 2014-12-24 (×4): 10 mg via ORAL
  Filled 2014-12-21 (×5): qty 1

## 2014-12-21 MED ORDER — HYDRALAZINE HCL 10 MG PO TABS
10.0000 mg | ORAL_TABLET | Freq: Three times a day (TID) | ORAL | Status: DC
  Administered 2014-12-21 – 2014-12-24 (×8): 10 mg via ORAL
  Filled 2014-12-21 (×11): qty 1

## 2014-12-21 MED ORDER — DEXTROSE 5 % IV SOLN
1.0000 g | INTRAVENOUS | Status: DC
  Administered 2014-12-21 – 2014-12-24 (×4): 1 g via INTRAVENOUS
  Filled 2014-12-21 (×4): qty 10

## 2014-12-21 MED ORDER — LEVOFLOXACIN 500 MG PO TABS
500.0000 mg | ORAL_TABLET | Freq: Once | ORAL | Status: AC
  Administered 2014-12-21: 500 mg via ORAL
  Filled 2014-12-21: qty 1

## 2014-12-21 MED ORDER — CETYLPYRIDINIUM CHLORIDE 0.05 % MT LIQD
7.0000 mL | Freq: Two times a day (BID) | OROMUCOSAL | Status: DC
  Administered 2014-12-21 – 2014-12-25 (×8): 7 mL via OROMUCOSAL

## 2014-12-21 MED ORDER — ASPIRIN EC 81 MG PO TBEC
81.0000 mg | DELAYED_RELEASE_TABLET | Freq: Every day | ORAL | Status: DC
Start: 2014-12-22 — End: 2014-12-25
  Administered 2014-12-22 – 2014-12-25 (×4): 81 mg via ORAL
  Filled 2014-12-21 (×4): qty 1

## 2014-12-21 MED ORDER — ENOXAPARIN SODIUM 40 MG/0.4ML ~~LOC~~ SOLN
40.0000 mg | SUBCUTANEOUS | Status: DC
  Administered 2014-12-21 – 2014-12-24 (×4): 40 mg via SUBCUTANEOUS
  Filled 2014-12-21 (×5): qty 0.4

## 2014-12-21 MED ORDER — DIVALPROEX SODIUM 500 MG PO DR TAB
500.0000 mg | DELAYED_RELEASE_TABLET | Freq: Every day | ORAL | Status: DC
  Administered 2014-12-21 – 2014-12-24 (×4): 500 mg via ORAL
  Filled 2014-12-21 (×5): qty 1

## 2014-12-21 MED ORDER — AMLODIPINE BESYLATE 5 MG PO TABS
5.0000 mg | ORAL_TABLET | Freq: Every day | ORAL | Status: DC
  Administered 2014-12-22 – 2014-12-25 (×4): 5 mg via ORAL
  Filled 2014-12-21 (×4): qty 1

## 2014-12-21 MED ORDER — SODIUM CHLORIDE 0.9 % IV SOLN
INTRAVENOUS | Status: DC
  Administered 2014-12-21 – 2014-12-23 (×3): via INTRAVENOUS

## 2014-12-21 MED ORDER — DIVALPROEX SODIUM 250 MG PO DR TAB
250.0000 mg | DELAYED_RELEASE_TABLET | Freq: Two times a day (BID) | ORAL | Status: DC
  Filled 2014-12-21: qty 2

## 2014-12-21 MED ORDER — ONDANSETRON HCL 4 MG PO TABS: 4.0000 mg | ORAL_TABLET | Freq: Four times a day (QID) | ORAL | Status: DC | PRN

## 2014-12-21 MED ORDER — OXYCODONE-ACETAMINOPHEN 5-325 MG PO TABS
1.0000 | ORAL_TABLET | ORAL | Status: DC | PRN
  Administered 2014-12-21 (×2): 1 via ORAL
  Administered 2014-12-22 – 2014-12-24 (×6): 2 via ORAL
  Filled 2014-12-21 (×5): qty 2
  Filled 2014-12-21: qty 1
  Filled 2014-12-21: qty 2
  Filled 2014-12-21: qty 1

## 2014-12-21 MED ORDER — DIVALPROEX SODIUM 250 MG PO DR TAB
250.0000 mg | DELAYED_RELEASE_TABLET | Freq: Every day | ORAL | Status: DC
  Administered 2014-12-22 – 2014-12-25 (×4): 250 mg via ORAL
  Filled 2014-12-21 (×4): qty 1

## 2014-12-21 MED ORDER — PRAVASTATIN SODIUM 40 MG PO TABS
40.0000 mg | ORAL_TABLET | Freq: Every day | ORAL | Status: DC
  Administered 2014-12-22 – 2014-12-25 (×4): 40 mg via ORAL
  Filled 2014-12-21 (×5): qty 1

## 2014-12-21 MED ORDER — GABAPENTIN 400 MG PO CAPS
800.0000 mg | ORAL_CAPSULE | Freq: Three times a day (TID) | ORAL | Status: DC
  Administered 2014-12-21 – 2014-12-25 (×11): 800 mg via ORAL
  Filled 2014-12-21 (×13): qty 2

## 2014-12-21 MED ORDER — MORPHINE SULFATE (PF) 2 MG/ML IV SOLN
1.0000 mg | INTRAVENOUS | Status: DC | PRN
  Administered 2014-12-21: 1 mg via INTRAVENOUS
  Filled 2014-12-21: qty 1

## 2014-12-21 NOTE — ED Provider Notes (Signed)
CSN: [id number]     Arrival date & time 12/21/14  1249 History   First MD Initiated Contact with Patient 12/21/14 1412     Chief Complaint  Patient presents with  . Fall  . Weakness     (Consider location/radiation/quality/duration/timing/severity/associated sxs/prior Treatment) HPI Patient reports he was going out of his bathroom yesterday and his foot slipped causing him to fall in the bathroom. His leg kind of split out from below him and ever since she's had a lot of pain in his left ankle. It is very difficult for him to walk because of pain in that ankle. His family members found him reportedly within about 15 minutes. He has not had fever, chest pain, cough or shortness of breath. He has a history of a CVA with some pre-existing weakness in the left leg. Family members note that when he gets this weakness, often times he has had a UTI. They report is always asymptomatic with it and just starts to get more weak and less functional. At this point the patient is having difficulty getting up from his chair and managing his basic needs. He does live alone. Family members check in frequently but cannot be there at all times.  Past Medical History  Diagnosis Date  . Hypertension   . CVA (cerebral vascular accident)   . Dementia   . Prostate cancer   . Cellulitis currently    right foot  . Arthritis    Past Surgical History  Procedure Laterality Date  . Neck surgery    . Back surgery    . Spider bite surgery      on left leg  . Prostate seed implants     Family History  Problem Relation Age of Onset  . Hypotension Mother   . Diabetes type II Mother   . CAD Mother   . Hypotension Father   . CAD Father   . CAD Sister    Social History  Substance Use Topics  . Smoking status: Former Smoker -- 15 years  . Smokeless tobacco: Never Used  . Alcohol Use: No     Comment: none in 2 years or more    Review of Systems  10 Systems reviewed and are negative for acute change except  as noted in the HPI.   Allergies  Review of patient's allergies indicates no known allergies.  Home Medications   Prior to Admission medications   Medication Sig Start Date End Date Taking? Authorizing Provider  allopurinol (ZYLOPRIM) 100 MG tablet Take 100 mg by mouth daily.   Yes Historical Provider, MD  amLODipine (NORVASC) 5 MG tablet Take 5 mg by mouth daily.   Yes Historical Provider, MD  aspirin EC 81 MG tablet Take 81 mg by mouth daily.   Yes Historical Provider, MD  divalproex (DEPAKOTE) 250 MG DR tablet Take 250-500 mg by mouth 2 (two) times daily. Take one tablet in the am and 2 tablets in the pm   Yes Historical Provider, MD  donepezil (ARICEPT) 10 MG tablet Take 10 mg by mouth at bedtime.   Yes Historical Provider, MD  gabapentin (NEURONTIN) 800 MG tablet Take 800 mg by mouth 3 (three) times daily.   Yes Historical Provider, MD  hydrALAZINE (APRESOLINE) 10 MG tablet Take 1 tablet (10 mg total) by mouth every 8 (eight) hours. 02/04/14  Yes Theodis Blaze, MD  hydrochlorothiazide (MICROZIDE) 12.5 MG capsule Take 12.5 mg by mouth daily.   Yes Historical Provider, MD  irbesartan (  AVAPRO) 150 MG tablet Take 150 mg by mouth daily.   Yes Historical Provider, MD  oxybutynin (DITROPAN) 5 MG tablet Take 5 mg by mouth 3 (three) times daily.   Yes Historical Provider, MD  PARoxetine (PAXIL) 20 MG tablet Take 20 mg by mouth daily.   Yes Historical Provider, MD  pravastatin (PRAVACHOL) 40 MG tablet Take 40 mg by mouth daily.   Yes Historical Provider, MD  ciprofloxacin (CIPRO) 500 MG tablet Take 1 tablet (500 mg total) by mouth 2 (two) times daily. One po bid x 7 days Patient not taking: Reported on 12/21/2014 09/29/14   Veryl Speak, MD  Erythromycin 2 % ointment Apply to affected area 2 times daily Patient not taking: Reported on 09/29/2014 07/18/14   Everlene Balls, MD  erythromycin ophthalmic ointment Place a 1/2 inch ribbon of ointment into the lower eyelid. Patient not taking: Reported on  12/21/2014 09/29/14   Veryl Speak, MD   BP 124/58 mmHg  Pulse 83  Temp(Src) 99.4 F (37.4 C) (Oral)  Resp 18  SpO2 100% Physical Exam  Constitutional: He is oriented to person, place, and time. He appears well-developed and well-nourished.  Patient is pleasant and shows no signs of distress. No respiratory distress. He is nontoxic.  HENT:  Head: Normocephalic and atraumatic.  Nose: Nose normal.  Mouth/Throat: Oropharynx is clear and moist.  Eyes: EOM are normal. Pupils are equal, round, and reactive to light.  The patient has ectropion of the left eye. He has some diffuse scleral injection associated.  Neck: Neck supple.  Cardiovascular: Normal rate, regular rhythm, normal heart sounds and intact distal pulses.   Pulmonary/Chest: Effort normal. He has no wheezes (occasional wheeze on the right lower lung field.).  Abdominal: Soft. Bowel sounds are normal. He exhibits no distension. There is no tenderness.  Musculoskeletal: Normal range of motion. He exhibits tenderness. He exhibits no edema.  Patient ports pain to palpation or movement of the lateral malleolus and any range of motion at the ankle on the left. Mild swelling. Dorsalis pedis pulse 2+ and strong. No general edema of the foot. No pain to the calf or the lower leg or the knee.  Neurological: He is alert and oriented to person, place, and time. He has normal strength. No cranial nerve deficit. He exhibits normal muscle tone. Coordination normal. GCS eye subscore is 4. GCS verbal subscore is 5. GCS motor subscore is 6.  Skin: Skin is warm, dry and intact.  Psychiatric: He has a normal mood and affect.    ED Course  Procedures (including critical care time) Labs Review Labs Reviewed  BASIC METABOLIC PANEL - Abnormal; Notable for the following:    BUN 35 (*)    Creatinine, Ser 1.69 (*)    Calcium 8.6 (*)    GFR calc non Af Amer 36 (*)    GFR calc Af Amer 41 (*)    All other components within normal limits  CBC - Abnormal;  Notable for the following:    RBC 3.78 (*)    Hemoglobin 9.5 (*)    HCT 30.0 (*)    MCH 25.1 (*)    RDW 20.1 (*)    All other components within normal limits  URINALYSIS, ROUTINE W REFLEX MICROSCOPIC (NOT AT [facility]) - Abnormal; Notable for the following:    APPearance CLOUDY (*)    Hgb urine dipstick TRACE (*)    Nitrite POSITIVE (*)    Leukocytes, UA LARGE (*)    All other components  within normal limits  URINE MICROSCOPIC-ADD ON - Abnormal; Notable for the following:    Bacteria, UA MANY (*)    All other components within normal limits  URINE CULTURE    Imaging Review No results found. I have personally reviewed and evaluated these images and lab results as part of my medical decision-making.   EKG Interpretation None      MDM   Final diagnoses:  UTI (lower urinary tract infection)  Fall, initial encounter  Ambulatory dysfunction   At this time patient does have UTI and generalized weakness. He has laboratory dysfunction and failure of ADLs at home. Patient will be hospitalized at this time for ongoing treatment of infection as underlying etiology. He also has an ankle injury from a fall yesterday. This is neurovascularly stable and appropriate for splinting and nonweightbearing.    Charlesetta Shanks, MD 12/21/14 1535

## 2014-12-21 NOTE — Progress Notes (Signed)
CSW received consult. Pt pcp has been assisting with placement however pt family has limited funds.   CSW met with pt daughter, who requested to speak privatley and not in front of patient. Patient daughter states that she and her brother have been staying with patient in shifts due to dementia and needing assistance and prompting. p thas not been wandering but struggles with time of day, making sure he gets all of his meals, and overall safety. Pt family states that pt had a semi fall and unable to bear weight.   Pt states that patient family have attempted to ge tpatient mediciad but did not qualify however now he may due to patient having filed for bankruptcy due to house balloon mortgage. Patient family shared that they would like patient to go to a short term rehab and then hoepfully go to assisted living but understand that it may not be available immediately.   Pt pending further medical work up to determine pt disposition. CSW consulted with Rn cm for possible home health needs and referrral to [facility].   CSW can assist with pasarr to aid with process of thn if patient agreeable to snf placement for short term rehab.   Belia Heman, [place] Work  [employer] [phone]

## 2014-12-21 NOTE — Progress Notes (Addendum)
81 Pt visited with pt who is alert to person, place, situation and time Pt able to tell Cm his visitors were his daughter and son. Before Cm could completely tell pt why Cm was in the room, pt's daughter stopped CM and said "Don;t talk to him about that facility." Cm informed her she did not know what she was referring to but CM wanted to check with pt to see if he was satisfied with previous services from [facility] and if the EDP decides he needs [facility] services, could Cm contact them Pt stated he did want to use the services of [facility] if needed.  Cm inquired if son and daughter had questions They did not and Cm left copy of [place] list containing [facility] contact information and private duty (PDN) list with pt's daughter  79 81 yr old [employer] medicare pt from home, reports he lives alone, hx of dementia. Hx of stroke that caused left sided weakness. yseterday pt semi fell. Family heard a noise and went into room. Pt had fallen into a split with left leg forward. Pt did not hit head, no LOC. Pt no complaining of left ankle/foot pain 8/10. Family reports increased weakness in left leg, not able to bear pressure on leg, which is not normal.  ED CM consulted by ED SW  CM noted this pt was followed by [facility] in [facility] No admissions but 3 ED visits in the last 6 months Pending resulting of any [facility] ED labs/imaging

## 2014-12-21 NOTE — ED Notes (Addendum)
Pt from home, reports he lives alone, hx of dementia. Hx of stroke that caused left sided weakness. yseterday pt semi fell. Family heard a noise and went into room. Pt had fallen into a split with left leg forward. Pt did not hit head, no LOC. Pt no complaining of left ankle/foot pain 8/10. Family reports increased weakness in left leg, not able to bear pressure on leg, which is not normal.   Family report hx of UTI, and that normally pt gets weak when he has a UTI, family requesting he be tested for UTI.

## 2014-12-21 NOTE — H&P (Addendum)
[facility] History and Physical  Manuel King ZHG:[id number] DOB: 03/26/1930 DOA: 12/21/2014  Referring physician: ED physician, Dr. Colvin Caroli PCP: Merrilee Seashore, MD   Chief Complaint: weakness   HPI:  Pt is 81 yo male with HTN, CVA, dementia, presents to [facility] ED with main concern of several days duration of progressive weakness afer recent fall and subsequent left ankle sprain. Pt also reported some urinary urgency and frequency several days in duration associated with subjective fevers, chills, poor oral intake. Pt denies chest pain, shortness of breath, no specific abd concerns.    In ED, pt noted to be hemodynamically stable, VSS, blood work notable for Cr 1.69, UA suggestive of UTI, pt started on rocephin in ED and [facility] asked to admit for further evaluation.   Assessment and Plan:   Active Problems:  Recent fall - with subsequent left ankle sprain - will need PT once more medically stable  UTI (lower urinary tract infection)  - started on Rocephin  - follow up on urine culture and readjust the regimen as indicated   Bradycardia  - has been noted on last admission in 2015 - please note that pt is asymptomatic  - pt denies chest pain   Acute renal failure imposed on chronic kidney disease, stage II - III  - baseline Cr 1.5  - pre renal etiology imposed on CKD  - IVF started, repeat BMP in AM  Failure to thrive  - request nutrition consultation   Dementia  - continue donepezil   HLD  - continue statin   Accelerated HTN  - continue Norvasc, Hydralazine    Radiological Exams on Admission: No results found.   Code Status: Full Family Communication: Pt and family at bedside Disposition Plan: Admit for further evaluation    Mart Piggs [facility] [phone]   Review of Systems:  Constitutional: Negative for diaphoresis.  HENT: Negative for hearing loss, ear pain, nosebleeds, congestion, sore throat, neck pain, tinnitus and ear discharge.    Eyes: Negative for blurred vision, double vision, photophobia, pain, discharge and redness.  Respiratory: Negative for cough, hemoptysis, sputum production, shortness of breath, wheezing and stridor.   Cardiovascular: Negative for chest pain, palpitations, orthopnea, claudication and leg swelling.  Gastrointestinal: Negative for nausea, vomiting and abdominal pain.  Genitourinary: Negative for hematuria and flank pain.  Musculoskeletal: Negative for myalgias, back pain, joint pain and falls.  Skin: Negative for itching and rash.  Neurological: Negative for dizziness Endo/Heme/Allergies: Negative for environmental allergies and polydipsia. Does not bruise/bleed easily.  Psychiatric/Behavioral: Negative for suicidal ideas. The patient is not nervous/anxious.      Past Medical History  Diagnosis Date  . Hypertension   . CVA (cerebral vascular accident)   . Dementia   . Prostate cancer   . Cellulitis currently    right foot  . Arthritis     Past Surgical History  Procedure Laterality Date  . Neck surgery    . Back surgery    . Spider bite surgery      on left leg  . Prostate seed implants      Social History:  reports that he has quit smoking. He has never used smokeless tobacco. He reports that he does not drink alcohol or use illicit drugs.  No Known Allergies  Family History  Problem Relation Age of Onset  . Hypotension Mother   . Diabetes type II Mother   . CAD Mother   . Hypotension Father   . CAD Father   . CAD  Sister     Prior to Admission medications   Medication Sig Start Date End Date Taking? Authorizing Provider  allopurinol (ZYLOPRIM) 100 MG tablet Take 100 mg by mouth daily.   Yes Historical Provider, MD  amLODipine (NORVASC) 5 MG tablet Take 5 mg by mouth daily.   Yes Historical Provider, MD  aspirin EC 81 MG tablet Take 81 mg by mouth daily.   Yes Historical Provider, MD  divalproex (DEPAKOTE) 250 MG DR tablet Take 250-500 mg by mouth 2 (two) times  daily. Take one tablet in the am and 2 tablets in the pm   Yes Historical Provider, MD  donepezil (ARICEPT) 10 MG tablet Take 10 mg by mouth at bedtime.   Yes Historical Provider, MD  gabapentin (NEURONTIN) 800 MG tablet Take 800 mg by mouth 3 (three) times daily.   Yes Historical Provider, MD  hydrALAZINE (APRESOLINE) 10 MG tablet Take 1 tablet (10 mg total) by mouth every 8 (eight) hours. 02/04/14  Yes Theodis Blaze, MD  hydrochlorothiazide (MICROZIDE) 12.5 MG capsule Take 12.5 mg by mouth daily.   Yes Historical Provider, MD  irbesartan (AVAPRO) 150 MG tablet Take 150 mg by mouth daily.   Yes Historical Provider, MD  oxybutynin (DITROPAN) 5 MG tablet Take 5 mg by mouth 3 (three) times daily.   Yes Historical Provider, MD  PARoxetine (PAXIL) 20 MG tablet Take 20 mg by mouth daily.   Yes Historical Provider, MD  pravastatin (PRAVACHOL) 40 MG tablet Take 40 mg by mouth daily.   Yes Historical Provider, MD  ciprofloxacin (CIPRO) 500 MG tablet Take 1 tablet (500 mg total) by mouth 2 (two) times daily. One po bid x 7 days Patient not taking: Reported on 12/21/2014 09/29/14   Veryl Speak, MD  Erythromycin 2 % ointment Apply to affected area 2 times daily Patient not taking: Reported on 09/29/2014 07/18/14   Everlene Balls, MD  erythromycin ophthalmic ointment Place a 1/2 inch ribbon of ointment into the lower eyelid. Patient not taking: Reported on 12/21/2014 09/29/14   Veryl Speak, MD    Physical Exam: Filed Vitals:   12/21/14 1309  BP: 124/58  Pulse: 83  Temp: 99.4 F (37.4 C)  TempSrc: Oral  Resp: 18  SpO2: 100%    Physical Exam  Constitutional: Appears well-developed and well-nourished. No distress.  HENT: Normocephalic. External right and left ear normal. Oropharynx is clear and moist.  Eyes: Conjunctivae and EOM are normal. PERRLA, no scleral icterus.  Neck: Normal ROM. Neck supple. No JVD. No tracheal deviation. No thyromegaly.  CVS: RRR, S1/S2 +, no gallops, no carotid bruit.   Pulmonary: Effort and breath sounds normal, no stridor, rhonchi, wheezes, rales.  Abdominal: Soft. BS +,  no distension, tenderness, rebound or guarding.  Musculoskeletal: Normal range of motion. No edema and no tenderness.  Lymphadenopathy: No lymphadenopathy noted, cervical, inguinal. Neuro: Alert. Normal reflexes, muscle tone coordination. No cranial nerve deficit. Skin: Skin is warm and dry. No rash noted. Not diaphoretic. No erythema. No pallor.  Psychiatric: Normal mood and affect. Behavior, judgment, thought content normal.   Labs on Admission:  Basic Metabolic Panel:  Recent Labs Lab 12/21/14 1414  NA 135  K 4.8  CL 103  CO2 23  GLUCOSE 97  BUN 35*  CREATININE 1.69*  CALCIUM 8.6*   CBC:  Recent Labs Lab 12/21/14 1414  WBC 5.5  HGB 9.5*  HCT 30.0*  MCV 79.4  PLT 210   EKG: pending   If 7PM-7AM, please contact  night-coverage [email or web address] Password [facility] 12/21/2014, 3:39 PM

## 2014-12-21 NOTE — ED Notes (Signed)
Fall yesterday at home, found by family.LLE pain. No rotation or deformity.   In process of placing patient at a nursing home.

## 2014-12-21 NOTE — ED Notes (Signed)
Bed: IO27 Expected date:  Expected time:  Means of arrival:  Comments: Hold HALL A

## 2014-12-21 NOTE — Progress Notes (Signed)
Pt is elligible for [facility] services.   Belia Heman, [place] Work  [employer] [phone]

## 2014-12-22 DIAGNOSIS — R262 Difficulty in walking, not elsewhere classified: Secondary | ICD-10-CM

## 2014-12-22 LAB — CBC
HEMATOCRIT: 30.1 % — AB (ref 39.0–52.0)
HEMOGLOBIN: 9.4 g/dL — AB (ref 13.0–17.0)
MCH: 24.8 pg — AB (ref 26.0–34.0)
MCHC: 31.2 g/dL (ref 30.0–36.0)
MCV: 79.4 fL (ref 78.0–100.0)
Platelets: 192 10*3/uL (ref 150–400)
RBC: 3.79 MIL/uL — ABNORMAL LOW (ref 4.22–5.81)
RDW: 19.7 % — AB (ref 11.5–15.5)
WBC: 5.6 10*3/uL (ref 4.0–10.5)

## 2014-12-22 LAB — BASIC METABOLIC PANEL
ANION GAP: 8 (ref 5–15)
BUN: 30 mg/dL — AB (ref 6–20)
CHLORIDE: 101 mmol/L (ref 101–111)
CO2: 25 mmol/L (ref 22–32)
Calcium: 8.3 mg/dL — ABNORMAL LOW (ref 8.9–10.3)
Creatinine, Ser: 1.55 mg/dL — ABNORMAL HIGH (ref 0.61–1.24)
GFR calc Af Amer: 46 mL/min — ABNORMAL LOW (ref >60)
GFR, EST NON AFRICAN AMERICAN: 40 mL/min — AB (ref >60)
GLUCOSE: 90 mg/dL (ref 65–99)
POTASSIUM: 4 mmol/L (ref 3.5–5.1)
Sodium: 134 mmol/L — ABNORMAL LOW (ref 135–145)

## 2014-12-22 MED ORDER — ACETAMINOPHEN 325 MG PO TABS
650.0000 mg | ORAL_TABLET | Freq: Once | ORAL | Status: AC
  Administered 2014-12-23: 650 mg via ORAL
  Filled 2014-12-22: qty 2

## 2014-12-22 MED ORDER — HYDRALAZINE HCL 20 MG/ML IJ SOLN
10.0000 mg | Freq: Once | INTRAMUSCULAR | Status: AC
  Administered 2014-12-22: 10 mg via INTRAVENOUS
  Filled 2014-12-22: qty 1

## 2014-12-22 NOTE — Progress Notes (Signed)
Patient ID: Manuel King, male   DOB: [birth year], 81 y.o.   MRN: [id number]  [facility] PROGRESS NOTE  AABAN GRIEP QIW:[id number] DOB: 04-Dec-1929 DOA: 12/21/2014 PCP: Merrilee Seashore, MD   Brief narrative:    Pt is 81 yo male with HTN, CVA, dementia, presents to [facility] ED with main concern of several days duration of progressive weakness afer recent fall and subsequent left ankle sprain. Pt also reported some urinary urgency and frequency several days in duration associated with subjective fevers, chills, poor oral intake. Pt denies chest pain, shortness of breath, no specific abd concerns.   In ED, pt noted to be hemodynamically stable, VSS, blood work notable for Cr 1.69, UA suggestive of UTI, pt started on rocephin in ED and [facility] asked to admit for further evaluation.  Assessment/Plan:    Active Problems:  Recent fall - with subsequent left ankle sprain - will need PT once more medically stable, will try today   UTI (lower urinary tract infection)  - continue Rocephin day #2 pending final cultures  - follow up on urine culture and readjust the regimen as indicated   Bradycardia  - has been noted on last admission in 2015 - please note that pt is asymptomatic   Acute renal failure imposed on chronic kidney disease, stage II - III  - baseline Cr 1.5  - pre renal etiology imposed on CKD  - IVF started, will continue today and repeat BMP in AM  Failure to thrive  - request nutrition consultation   Dementia  - continue donepezil   HLD  - continue statin   Accelerated HTN  - continue Norvasc, Hydralazine   DVT prophylaxis - Lovenox SQ  Code Status: Full.  Family Communication:  plan of care discussed with the patient Disposition Plan: Home in 1-2 days   IV access:  Peripheral IV  Procedures and diagnostic studies:    Dg Ankle Complete Left 12/21/2014  No fracture.     Dg Knee Complete 4 Views Left 12/21/2014  No fracture or effusion. 2.  Mild degenerative changes. 3. Mild chondrocalcinosis.     Medical Consultants:  None  Other Consultants:  PT   IAnti-Infectives:   Rocephin 8/18 -->  Faye Ramsay, MD  [facility] Pager [phone]  If 7PM-7AM, please contact night-coverage [email or web address] Password [email or web address] 12/22/2014, 9:28 AM   LOS: 1 day   HPI/Subjective: No events overnight. Left ankle pain 4/10 in severity.   Objective: Filed Vitals:   12/21/14 1946 12/21/14 2107 12/22/14 0426 12/22/14 0515  BP:  146/53  167/81  Pulse:  54    Temp:  98.1 F (36.7 C)  97.5 F (36.4 C)  TempSrc:  Oral  Oral  Resp:  18  18  Height: 6\' 4"  (1.93 m)     Weight: 99.2 kg (218 lb 11.1 oz)  99.3 kg (218 lb 14.7 oz)   SpO2:  93%  97%    Intake/Output Summary (Last 24 hours) at 12/22/14 4481 Last data filed at 12/22/14 0425  Gross per 24 hour  Intake    240 ml  Output    100 ml  Net    140 ml    Exam:   General:  Pt is alert, follows commands appropriately, not in acute distress  Cardiovascular: Regular rhythm, bradycardia, no rubs, no gallops  Respiratory: Clear to auscultation bilaterally, no wheezing, no crackles, no rhonchi  Abdomen: Soft, non tender, non distended, bowel sounds present, no guarding  Extremities: No edema, pulses DP and  PT palpable bilaterally, TTP in the left ankle area   Neuro: Grossly nonfocal  Data Reviewed: Basic Metabolic Panel:  Recent Labs Lab 12/21/14 1414 12/22/14 0455  NA 135 134*  K 4.8 4.0  CL 103 101  CO2 23 25  GLUCOSE 97 90  BUN 35* 30*  CREATININE 1.69* 1.55*  CALCIUM 8.6* 8.3*   CBC:  Recent Labs Lab 12/21/14 1414 12/22/14 0455  WBC 5.5 5.6  HGB 9.5* 9.4*  HCT 30.0* 30.1*  MCV 79.4 79.4  PLT 210 192   Scheduled Meds: . allopurinol  100 mg Oral Daily  . amLODipine  5 mg Oral Daily  . antiseptic oral rinse  7 mL Mouth Rinse BID  . aspirin EC  81 mg Oral Daily  . cefTRIAXone (ROCEPHIN)  IV  1 g Intravenous Q24H  . divalproex  250 mg Oral Daily   And  .  divalproex  500 mg Oral QHS  . donepezil  10 mg Oral QHS  . enoxaparin (LOVENOX) injection  40 mg Subcutaneous Q24H  . gabapentin  800 mg Oral TID  . hydrALAZINE  10 mg Oral 3 times per day  . oxybutynin  5 mg Oral TID  . PARoxetine  20 mg Oral Daily  . pravastatin  40 mg Oral Daily   Continuous Infusions: . sodium chloride 75 mL/hr at 12/21/14 1700

## 2014-12-22 NOTE — Progress Notes (Signed)
Pt complaining of new onset HA and BP is 180s/60s HR 54. Pt with no neurological deficits at this time. No PRN antihypertensives ordered at this time. Will contact NP on call and update NP on pt condition. Will continue to monitor patient's condition.

## 2014-12-22 NOTE — Care Management Note (Signed)
Case Management Note  Patient Details  Name: JAIDON ELLERY MRN: [id number] Date of Birth: [birth year]  Subjective/Objective:           aki and sepsis         Action/Plan:Date:  December 22, 2014 U.R. performed for needs and level of care. Will continue to follow for Case Management needs.  Velva Harman, RN, BSN, [place]   [phone]   Expected Discharge Date:   (unknown)               Expected Discharge Plan:  Home/Self Care  In-House Referral:  NA  Discharge planning Services  CM Consult  Post Acute Care Choice:  NA Choice offered to:  NA  DME Arranged:  N/A DME Agency:  NA  HH Arranged:  NA HH Agency:  NA  Status of Service:  In process, will continue to follow  Medicare Important Message Given:    Date Medicare IM Given:    Medicare IM give by:    Date Additional Medicare IM Given:    Additional Medicare Important Message give by:     If discussed at [place] of Stay Meetings, dates discussed:    Additional Comments:  Leeroy Cha, RN 12/22/2014, 10:15 AM

## 2014-12-22 NOTE — Evaluation (Signed)
Physical Therapy Evaluation Patient Details Name: Manuel King MRN: [id number] DOB: Feb 01, 1930 Today's Date: 12/22/2014   History of Present Illness  81 yo male with hx of HTN, CVA, dementia, admitted after progressive weakness afer recent fall and subsequent left ankle sprain, UTI  Clinical Impression  Pt admitted with above diagnosis. Pt currently with functional limitations due to the deficits listed below (see PT Problem List).  Pt will benefit from skilled PT to increase their independence and safety with mobility to allow discharge to the venue listed below.   Pt with L air cast ankle brace in place in bed and continues to reports increased pain at rest and during mobility.  May benefit from more supportive brace with compression component.  Pt assisted to recliner with RW for pain control and stability.  Pt currently requiring assist for transfers and lives alone.  Recommend SNF upon d/c.     Follow Up Recommendations SNF;Supervision/Assistance - 24 hour    Equipment Recommendations  None recommended by PT    Recommendations for Other Services       Precautions / Restrictions Precautions Precautions: Fall Precaution Comments: urinary incontinence Required Braces or Orthoses: Other Brace/Splint Other Brace/Splint: air cast splint on L ankle      Mobility  Bed Mobility Overal bed mobility: Needs Assistance Bed Mobility: Supine to Sit     Supine to sit: Min assist;HOB elevated     General bed mobility comments: verbal cues for self assist, HOB elevated and utilized rail, assist for trunk stability/support  Transfers Overall transfer level: Needs assistance Equipment used: Rolling walker (2 wheeled) Transfers: Sit to/from [facility] Sit to Stand: Min assist;From elevated surface Stand pivot transfers: Min assist       General transfer comment: verbal cues for hand placement, UE WBing to assist with L ankle pain, assisted to recliner, pt declined  ambulation due to pain  Ambulation/Gait                Stairs            Wheelchair Mobility    Modified Rankin (Stroke Patients Only)       Balance                                             Pertinent Vitals/Pain Pain Assessment: 0-10 Pain Score: 8  Pain Location: L ankle Pain Descriptors / Indicators: Sore Pain Intervention(s): Limited activity within patient's tolerance;Monitored during session;Ice applied (elevated)    Home Living Family/patient expects to be discharged to:: Private residence Living Arrangements: Alone Available Help at Discharge: Family Type of Home: House Home Access: Level entry     Home Layout: One level Home Equipment: Environmental consultant - 2 wheels;Shower seat      Prior Function Level of Independence: Independent with assistive device(s)         Comments: Pt was I at home with walker with family checking on pt daily     Hand Dominance        Extremity/Trunk Assessment   Upper Extremity Assessment: Generalized weakness           Lower Extremity Assessment: Generalized weakness;LLE deficits/detail   LLE Deficits / Details: residual L sided weakness after CVA per pt, able to wiggle toes and move LE against gravity     Communication   Communication: Receptive difficulties (appears to need increased time  to process ?baseline)  Cognition Arousal/Alertness: Awake/alert Behavior During Therapy: WFL for tasks assessed/performed Overall Cognitive Status: History of cognitive impairments - at baseline (hx dementia)                      General Comments      Exercises        Assessment/Plan    PT Assessment Patient needs continued PT services  PT Diagnosis Difficulty walking;Acute pain   PT Problem List Decreased strength;Decreased activity tolerance;Decreased mobility;Decreased knowledge of use of DME;Decreased cognition;Decreased balance;Decreased safety awareness;Pain  PT Treatment  Interventions DME instruction;Gait training;Functional mobility training;Patient/family education;Therapeutic activities;Therapeutic exercise   PT Goals (Current goals can be found in the Care Plan section) Acute Rehab PT Goals PT Goal Formulation: With patient/family Time For Goal Achievement: 01/05/15 Potential to Achieve Goals: Good    Frequency Min 3X/week   Barriers to discharge        Co-evaluation               End of Session Equipment Utilized During Treatment: Gait belt Activity Tolerance: Patient limited by pain Patient left: in chair;with call bell/phone within reach;with chair alarm set Nurse Communication: Mobility status         Time: 8325-4982 PT Time Calculation (min) (ACUTE ONLY): 24 min   Charges:   PT Evaluation $Initial PT Evaluation Tier I: 1 Procedure     PT G Codes:        Skyeler Scalese,[name] 12/22/2014, 12:53 PM [name], PT, DPT 12/22/2014 Pager: [phone]

## 2014-12-22 NOTE — Progress Notes (Signed)
Nutrition Brief Note  Consult received for assessment of nutrition requirements and status.  Wt Readings from Last 15 Encounters:  12/22/14 218 lb 14.7 oz (99.3 kg)  02/02/14 227 lb 3.2 oz (103.057 kg)  09/12/11 200 lb 9.9 oz (91 kg)  05/10/11 211 lb (95.709 kg)  09/29/08 192 lb (87.091 kg)    Body mass index is 26.66 kg/(m^2). Patient meets criteria for overweight based on current BMI.   Pt seen for consult. Spoke with pt's wife who reports all information. She states pt has a good appetite since hospitalization and ate very well with no issues PTA. She states that his weight was stable PTA. He was not drinking nutrition supplements as he was eating well.   Current diet order is Regular, patient is consuming approximately 75% of meals at this time. Labs and medications reviewed.   No nutrition interventions warranted at this time. If nutrition issues arise, please consult RD.      Jarome Matin, RD, LDN Inpatient Clinical Dietitian Pager # [phone] After hours/weekend pager # [phone]

## 2014-12-23 LAB — BASIC METABOLIC PANEL
Anion gap: 7 (ref 5–15)
BUN: 25 mg/dL — AB (ref 6–20)
CO2: 24 mmol/L (ref 22–32)
CREATININE: 1.22 mg/dL (ref 0.61–1.24)
Calcium: 8.1 mg/dL — ABNORMAL LOW (ref 8.9–10.3)
Chloride: 100 mmol/L — ABNORMAL LOW (ref 101–111)
GFR calc Af Amer: >60 mL/min (ref >60)
GFR, EST NON AFRICAN AMERICAN: 53 mL/min — AB (ref >60)
GLUCOSE: 101 mg/dL — AB (ref 65–99)
POTASSIUM: 3.7 mmol/L (ref 3.5–5.1)
SODIUM: 131 mmol/L — AB (ref 135–145)

## 2014-12-23 LAB — CBC
HCT: 30.2 % — ABNORMAL LOW (ref 39.0–52.0)
Hemoglobin: 9.8 g/dL — ABNORMAL LOW (ref 13.0–17.0)
MCH: 25.6 pg — AB (ref 26.0–34.0)
MCHC: 32.5 g/dL (ref 30.0–36.0)
MCV: 78.9 fL (ref 78.0–100.0)
PLATELETS: 190 10*3/uL (ref 150–400)
RBC: 3.83 MIL/uL — AB (ref 4.22–5.81)
RDW: 19.6 % — AB (ref 11.5–15.5)
WBC: 6.4 10*3/uL (ref 4.0–10.5)

## 2014-12-23 MED ORDER — TRAMADOL HCL 50 MG PO TABS
50.0000 mg | ORAL_TABLET | Freq: Once | ORAL | Status: AC
  Administered 2014-12-23: 50 mg via ORAL
  Filled 2014-12-23: qty 1

## 2014-12-23 MED ORDER — HYDRALAZINE HCL 20 MG/ML IJ SOLN
10.0000 mg | Freq: Four times a day (QID) | INTRAMUSCULAR | Status: DC | PRN
  Administered 2014-12-25: 10 mg via INTRAVENOUS
  Filled 2014-12-23: qty 1

## 2014-12-23 NOTE — Progress Notes (Signed)
Patient ID: Manuel King, male   DOB: May 21, 1929, 81 y.o.   MRN: [id number]  [facility] PROGRESS NOTE  Manuel King:[id number] DOB: [birth year] DOA: 12/21/2014 PCP: Merrilee Seashore, MD   Brief narrative:    Pt is 81 yo male with HTN, CVA, dementia, presents to [facility] ED with main concern of several days duration of progressive weakness afer recent fall and subsequent left ankle sprain. Pt also reported some urinary urgency and frequency several days in duration associated with subjective fevers, chills, poor oral intake. Pt denies chest pain, shortness of breath, no specific abd concerns.   In ED, pt noted to be hemodynamically stable, VSS, blood work notable for Cr 1.69, UA suggestive of UTI, pt started on rocephin in ED and [facility] asked to admit for further evaluation.  Assessment/Plan:    Active Problems:  Recent fall - with subsequent left ankle sprain - PT evaluation done and recommend SNF, daughter in agreement but pt wants to go home  UTI (lower urinary tract infection)  - continue Rocephin day #3 pending final cultures  - follow up on urine culture and readjust the regimen as indicated   Bradycardia  - has been noted on last admission in 2015 - please note that pt is asymptomatic   Acute renal failure imposed on chronic kidney disease, stage II - III  - baseline Cr 1.5  - pre renal etiology imposed on CKD  - IVF provided and Cr is now WNL - stop IVF  Failure to thrive  - tolerating diet well   Dementia  - continue donepezil   HLD  - continue statin   Accelerated HTN  - continue Norvasc, Hydralazine  - add hydralazine as needed   DVT prophylaxis - Lovenox SQ  Code Status: Full.  Family Communication:  plan of care discussed with the patient Disposition Plan: SNF on Monday    IV access:  Peripheral IV  Procedures and diagnostic studies:    Dg Ankle Complete Left 12/21/2014  No fracture.     Dg Knee Complete 4 Views Left 12/21/2014   No fracture or effusion. 2. Mild degenerative changes. 3. Mild chondrocalcinosis.     Medical Consultants:  None  Other Consultants:  PT   IAnti-Infectives:   Rocephin 8/18 -->  Manuel Ramsay, MD  [facility] Pager [phone]  If 7PM-7AM, please contact night-coverage [email or web address] Password [email or web address] 12/23/2014, 12:01 PM   LOS: 2 days   HPI/Subjective: No events overnight. Left ankle pain 2/10 in severity.   Objective: Filed Vitals:   12/22/14 2312 12/22/14 2319 12/23/14 0000 12/23/14 0518  BP: 184/63  161/67 121/64  Pulse: 57  61 52  Temp: 97.9 F (36.6 C) 100.2 F (37.9 C)  97.5 F (36.4 C)  TempSrc: Oral Oral  Oral  Resp: 18     Height:      Weight:      SpO2: 98%   96%    Intake/Output Summary (Last 24 hours) at 12/23/14 1201 Last data filed at 12/23/14 1025  Gross per 24 hour  Intake   2100 ml  Output   1951 ml  Net    149 ml    Exam:   General:  Pt is alert, follows commands appropriately, not in acute distress  Cardiovascular: Regular rhythm, bradycardia, no rubs, no gallops  Respiratory: Clear to auscultation bilaterally, no wheezing, no crackles, no rhonchi  Abdomen: Soft, non tender, non distended, bowel sounds present, no guarding  Data Reviewed: Basic Metabolic Panel:  Recent Labs  Lab 12/21/14 1414 12/22/14 0455 12/23/14 0501  NA 135 134* 131*  K 4.8 4.0 3.7  CL 103 101 100*  CO2 23 25 24   GLUCOSE 97 90 101*  BUN 35* 30* 25*  CREATININE 1.69* 1.55* 1.22  CALCIUM 8.6* 8.3* 8.1*   CBC:  Recent Labs Lab 12/21/14 1414 12/22/14 0455 12/23/14 0501  WBC 5.5 5.6 6.4  HGB 9.5* 9.4* 9.8*  HCT 30.0* 30.1* 30.2*  MCV 79.4 79.4 78.9  PLT 210 192 190   Scheduled Meds: . allopurinol  100 mg Oral Daily  . amLODipine  5 mg Oral Daily  . antiseptic oral rinse  7 mL Mouth Rinse BID  . aspirin EC  81 mg Oral Daily  . cefTRIAXone (ROCEPHIN)  IV  1 g Intravenous Q24H  . divalproex  250 mg Oral Daily   And  . divalproex  500 mg Oral QHS   . donepezil  10 mg Oral QHS  . enoxaparin (LOVENOX) injection  40 mg Subcutaneous Q24H  . gabapentin  800 mg Oral TID  . hydrALAZINE  10 mg Oral 3 times per day  . oxybutynin  5 mg Oral TID  . PARoxetine  20 mg Oral Daily  . pravastatin  40 mg Oral Daily   Continuous Infusions: . sodium chloride 75 mL/hr at 12/23/14 [phone]

## 2014-12-23 NOTE — Clinical Social Work Note (Addendum)
Clinical Social Work Assessment  Patient Details  Name: Manuel King MRN: [id number] Date of Birth: [birth year]  Date of referral:  12/23/14               Reason for consult:  Facility Placement                Permission sought to share information with:  Family Supports, Chartered certified accountant granted to share information::  Yes, Verbal Permission Granted  Name::        Agency::     Relationship::     Contact Information:     Housing/Transportation Living arrangements for the past 2 months:  Single Family Home Source of Information:  Adult Children Patient Interpreter Needed:    Criminal Activity/Legal Involvement Pertinent to Current Situation/Hospitalization:    Significant Relationships:  Adult Children Lives with:    Do you feel safe going back to the place where you live?    Need for family participation in patient care:  Yes (Comment)  Care giving concerns:  None reported   Facilities manager / plan:  CSW met with pt and his daughter at bedside.  CSW explained role of CSW and prompted pt and his daughter to discuss history and current needs.  CSW provided explanation of SNF process and encouraged pt and his daughter to explore thoughts and feelings related to pt diagnoses and rehab.  CSW provided supportive listening and will fax pt information to SNF's in [place] area  Employment status:  Retired Forensic scientist:  Managed Care PT Recommendations:  [place] / Referral to community resources:     Patient/Family's Response to care:  Pt very pleasant and wanted his daughter to handle things.  Pt's daughter discussed pt living in his own home but she and her brother are there around the clock.  Pt's brother stays at night and she stays in the day.  Pt has been to rehab at [facility] two years ago and is open to rehab again at discharge.  No pick of facility yet.  Patient/Family's Understanding of and Emotional  Response to Diagnosis, Current Treatment, and Prognosis:  Pt and daughter appeared to understand pt diagnoses and did not have any questions with regards to SNF.  Pt's daughter Manuel King provided additional cell phone and wants to be called with SNF options.  Emotional Assessment Appearance:  Appears younger than stated age Attitude/Demeanor/Rapport:   (cooperative) Affect (typically observed):  Accepting Orientation:  Oriented to Self, Oriented to Place Alcohol / Substance use:    Psych involvement (Current and /or in the community):     Discharge Needs  Concerns to be addressed:    Readmission within the last 30 days:    Current discharge risk:    Barriers to Discharge:  No Barriers Identified   Carlean Jews, LCSW 12/23/2014, 3:59 PM

## 2014-12-24 LAB — URINE CULTURE

## 2014-12-24 MED ORDER — HYDRALAZINE HCL 25 MG PO TABS
25.0000 mg | ORAL_TABLET | Freq: Three times a day (TID) | ORAL | Status: DC
  Administered 2014-12-24 – 2014-12-25 (×3): 25 mg via ORAL
  Filled 2014-12-24 (×6): qty 1

## 2014-12-24 NOTE — Progress Notes (Signed)
Patient ID: Manuel King, male   DOB: 04-23-1930, 81 y.o.   MRN: [id number]  [facility] PROGRESS NOTE  Manuel King EQA:[id number] DOB: May 01, 1930 DOA: 12/21/2014 PCP: Merrilee Seashore, MD   Brief narrative:    Pt is 81 yo male with HTN, CVA, dementia, presents to [facility] ED with main concern of several days duration of progressive weakness afer recent fall and subsequent left ankle sprain. Pt also reported some urinary urgency and frequency several days in duration associated with subjective fevers, chills, poor oral intake. Pt denies chest pain, shortness of breath, no specific abd concerns.   In ED, pt noted to be hemodynamically stable, VSS, blood work notable for Cr 1.69, UA suggestive of UTI, pt started on rocephin in ED and [facility] asked to admit for further evaluation.  Assessment/Plan:    Active Problems:  Recent fall - with subsequent left ankle sprain - PT evaluation done and recommend SNF, daughter in agreement but pt wants to go home - SNF placement in progress, possibly d/c in AM  UTI (lower urinary tract infection)  - continue Rocephin day #4 pending final cultures  - follow up on urine culture and readjust the regimen as indicated   Bradycardia  - has been noted on last admission in 2015 - please note that pt remains asymptomatic   Acute renal failure imposed on chronic kidney disease, stage II - III  - baseline Cr 1.5  - pre renal etiology imposed on CKD  - IVF provided and Cr is now WNL - IVF have been stopped 8/20  Failure to thrive  - tolerating diet well   Dementia  - continue donepezil   HLD  - continue statin   Accelerated HTN  - continue Norvasc, Hydralazine  - will increase the dose of Hydralazine form 10 mg to 25 mg PO - add hydralazine as needed   DVT prophylaxis - Lovenox SQ  Code Status: Full.  Family Communication:  plan of care discussed with the patient Disposition Plan: SNF on Monday    IV access:  Peripheral  IV  Procedures and diagnostic studies:    Dg Ankle Complete Left 12/21/2014  No fracture.     Dg Knee Complete 4 Views Left 12/21/2014  No fracture or effusion. 2. Mild degenerative changes. 3. Mild chondrocalcinosis.     Medical Consultants:  None  Other Consultants:  PT   IAnti-Infectives:   Rocephin 8/18 -->  Manuel Ramsay, MD  [facility] Pager [phone]  If 7PM-7AM, please contact night-coverage [email or web address] Password [email or web address] 12/24/2014, 11:01 AM   LOS: 3 days   HPI/Subjective: No events overnight. Left ankle pain 2/10 in severity.   Objective: Filed Vitals:   12/23/14 1457 12/23/14 2146 12/24/14 0605 12/24/14 0637  BP: 153/67 194/74 173/75   Pulse: 59 71 57   Temp: 98.1 F (36.7 C) 98.3 F (36.8 C) 98 F (36.7 C)   TempSrc: Oral Oral Oral   Resp: 18 18 25    Height:      Weight:    78.8 kg (173 lb 11.6 oz)  SpO2: 100% 100% 100%     Intake/Output Summary (Last 24 hours) at 12/24/14 1101 Last data filed at 12/24/14 0900  Gross per 24 hour  Intake    720 ml  Output 2950.3 ml  Net -2230.3 ml    Exam:   General:  Pt is alert, follows commands appropriately, not in acute distress  Cardiovascular: Regular rhythm, bradycardia, no rubs, no gallops  Respiratory: Clear to auscultation bilaterally,  no wheezing, no crackles, no rhonchi  Abdomen: Soft, non tender, non distended, bowel sounds present, no guarding  Data Reviewed: Basic Metabolic Panel:  Recent Labs Lab 12/21/14 1414 12/22/14 0455 12/23/14 0501  NA 135 134* 131*  K 4.8 4.0 3.7  CL 103 101 100*  CO2 23 25 24   GLUCOSE 97 90 101*  BUN 35* 30* 25*  CREATININE 1.69* 1.55* 1.22  CALCIUM 8.6* 8.3* 8.1*   CBC:  Recent Labs Lab 12/21/14 1414 12/22/14 0455 12/23/14 0501  WBC 5.5 5.6 6.4  HGB 9.5* 9.4* 9.8*  HCT 30.0* 30.1* 30.2*  MCV 79.4 79.4 78.9  PLT 210 192 190   Scheduled Meds: . allopurinol  100 mg Oral Daily  . amLODipine  5 mg Oral Daily  . antiseptic oral rinse  7 mL Mouth  Rinse BID  . aspirin EC  81 mg Oral Daily  . cefTRIAXone (ROCEPHIN)  IV  1 g Intravenous Q24H  . divalproex  250 mg Oral Daily   And  . divalproex  500 mg Oral QHS  . donepezil  10 mg Oral QHS  . enoxaparin (LOVENOX) injection  40 mg Subcutaneous Q24H  . gabapentin  800 mg Oral TID  . hydrALAZINE  25 mg Oral 3 times per day  . oxybutynin  5 mg Oral TID  . PARoxetine  20 mg Oral Daily  . pravastatin  40 mg Oral Daily   Continuous Infusions:

## 2014-12-25 DIAGNOSIS — Z9181 History of falling: Secondary | ICD-10-CM | POA: Diagnosis not present

## 2014-12-25 DIAGNOSIS — I10 Essential (primary) hypertension: Secondary | ICD-10-CM | POA: Diagnosis not present

## 2014-12-25 DIAGNOSIS — R262 Difficulty in walking, not elsewhere classified: Secondary | ICD-10-CM | POA: Diagnosis not present

## 2014-12-25 DIAGNOSIS — N3000 Acute cystitis without hematuria: Secondary | ICD-10-CM | POA: Diagnosis not present

## 2014-12-25 DIAGNOSIS — R2681 Unsteadiness on feet: Secondary | ICD-10-CM | POA: Diagnosis not present

## 2014-12-25 DIAGNOSIS — F0391 Unspecified dementia with behavioral disturbance: Secondary | ICD-10-CM | POA: Diagnosis not present

## 2014-12-25 DIAGNOSIS — R531 Weakness: Secondary | ICD-10-CM | POA: Diagnosis not present

## 2014-12-25 DIAGNOSIS — N179 Acute kidney failure, unspecified: Secondary | ICD-10-CM | POA: Diagnosis not present

## 2014-12-25 DIAGNOSIS — N390 Urinary tract infection, site not specified: Secondary | ICD-10-CM | POA: Diagnosis not present

## 2014-12-25 DIAGNOSIS — R278 Other lack of coordination: Secondary | ICD-10-CM | POA: Diagnosis not present

## 2014-12-25 DIAGNOSIS — Z5189 Encounter for other specified aftercare: Secondary | ICD-10-CM | POA: Diagnosis not present

## 2014-12-25 DIAGNOSIS — S93402D Sprain of unspecified ligament of left ankle, subsequent encounter: Secondary | ICD-10-CM | POA: Diagnosis not present

## 2014-12-25 DIAGNOSIS — S93402S Sprain of unspecified ligament of left ankle, sequela: Secondary | ICD-10-CM | POA: Diagnosis not present

## 2014-12-25 DIAGNOSIS — M6281 Muscle weakness (generalized): Secondary | ICD-10-CM | POA: Diagnosis not present

## 2014-12-25 DIAGNOSIS — F0280 Dementia in other diseases classified elsewhere without behavioral disturbance: Secondary | ICD-10-CM | POA: Diagnosis not present

## 2014-12-25 DIAGNOSIS — N182 Chronic kidney disease, stage 2 (mild): Secondary | ICD-10-CM | POA: Diagnosis not present

## 2014-12-25 DIAGNOSIS — N183 Chronic kidney disease, stage 3 (moderate): Secondary | ICD-10-CM | POA: Diagnosis not present

## 2014-12-25 DIAGNOSIS — Z8673 Personal history of transient ischemic attack (TIA), and cerebral infarction without residual deficits: Secondary | ICD-10-CM | POA: Diagnosis not present

## 2014-12-25 DIAGNOSIS — E871 Hypo-osmolality and hyponatremia: Secondary | ICD-10-CM | POA: Diagnosis not present

## 2014-12-25 DIAGNOSIS — E785 Hyperlipidemia, unspecified: Secondary | ICD-10-CM | POA: Diagnosis not present

## 2014-12-25 DIAGNOSIS — M25669 Stiffness of unspecified knee, not elsewhere classified: Secondary | ICD-10-CM | POA: Diagnosis not present

## 2014-12-25 LAB — BASIC METABOLIC PANEL
Anion gap: 8 (ref 5–15)
BUN: 25 mg/dL — ABNORMAL HIGH (ref 6–20)
CALCIUM: 8.3 mg/dL — AB (ref 8.9–10.3)
CO2: 24 mmol/L (ref 22–32)
CREATININE: 1.32 mg/dL — AB (ref 0.61–1.24)
Chloride: 100 mmol/L — ABNORMAL LOW (ref 101–111)
GFR calc non Af Amer: 48 mL/min — ABNORMAL LOW (ref >60)
GFR, EST AFRICAN AMERICAN: 56 mL/min — AB (ref >60)
Glucose, Bld: 92 mg/dL (ref 65–99)
Potassium: 4 mmol/L (ref 3.5–5.1)
Sodium: 132 mmol/L — ABNORMAL LOW (ref 135–145)

## 2014-12-25 LAB — CBC
HEMATOCRIT: 31.5 % — AB (ref 39.0–52.0)
Hemoglobin: 10.1 g/dL — ABNORMAL LOW (ref 13.0–17.0)
MCH: 25.1 pg — ABNORMAL LOW (ref 26.0–34.0)
MCHC: 32.1 g/dL (ref 30.0–36.0)
MCV: 78.4 fL (ref 78.0–100.0)
Platelets: 211 10*3/uL (ref 150–400)
RBC: 4.02 MIL/uL — ABNORMAL LOW (ref 4.22–5.81)
RDW: 19.6 % — AB (ref 11.5–15.5)
WBC: 5.6 10*3/uL (ref 4.0–10.5)

## 2014-12-25 MED ORDER — HYDRALAZINE HCL 25 MG PO TABS: 25.0000 mg | ORAL_TABLET | Freq: Three times a day (TID) | ORAL | Status: AC

## 2014-12-25 MED ORDER — SULFAMETHOXAZOLE-TRIMETHOPRIM 800-160 MG PO TABS
1.0000 | ORAL_TABLET | Freq: Two times a day (BID) | ORAL | Status: DC
  Administered 2014-12-25: 1 via ORAL
  Filled 2014-12-25 (×3): qty 1

## 2014-12-25 MED ORDER — OXYCODONE-ACETAMINOPHEN 5-325 MG PO TABS: 1.0000 | ORAL_TABLET | ORAL | Status: DC | PRN

## 2014-12-25 NOTE — Discharge Summary (Signed)
Physician Discharge Summary  Manuel King RWE:[id number] DOB: November 10, 1929 DOA: 12/21/2014  PCP: Merrilee Seashore, MD  Admit date: 12/21/2014 Discharge date: 12/25/2014  Recommendations for Outpatient Follow-up:  1. Pt will need to follow up with PCP in 2-3 weeks post discharge 2. Please obtain BMP to evaluate electrolytes and kidney function 3. Please also check CBC to evaluate Hg and Hct levels 4. Please note that I spoke with Dr. Linus Salmons ID specialist regarding treatment of staph in urine, he has recommended observation off ABX as pt is afebrile and asymptomatic, no WBC  Discharge Diagnoses:  Active Problems:   UTI (lower urinary tract infection)   Ambulatory dysfunction   Discharge Condition: Stable  Diet recommendation: Heart healthy diet discussed in details    Brief narrative:    Pt is 81 yo male with HTN, CVA, dementia, presents to [facility] ED with main concern of several days duration of progressive weakness afer recent fall and subsequent left ankle sprain. Pt also reported some urinary urgency and frequency several days in duration associated with subjective fevers, chills, poor oral intake. Pt denies chest pain, shortness of breath, no specific abd concerns.   In ED, pt noted to be hemodynamically stable, VSS, blood work notable for Cr 1.69, UA suggestive of UTI, pt started on rocephin in ED and [facility] asked to admit for further evaluation.  Assessment/Plan:    Active Problems:  Recent fall - with subsequent left ankle sprain - PT evaluation done and recommend SNF, daughter in agreement but pt wants to go home - SNF placement today   UTI (lower urinary tract infection)  - completed 5 days of Rocephin, urine culture with staph aureus - pt is however doing better and with no fever, no symptoms, no WBC, therefore Dr. Linus Salmons recommended no additional ABX for now and observation unless pt is symptomatic   Bradycardia  - has been noted on last admission in 2015 -  please note that pt remains asymptomatic   Acute renal failure imposed on chronic kidney disease, stage II - III  - baseline Cr 1.5  - pre renal etiology imposed on CKD  - IVF provided and Cr has improved   Failure to thrive  - tolerating diet well   Dementia  - continue donepezil   HLD  - continue statin   Accelerated HTN  - continue Norvasc, Hydralazine  - increased the dose of Hydralazine form 10 mg to 25 mg PO  Code Status: Full.  Family Communication: plan of care discussed with the patient Disposition Plan: SNF  IV access:  Peripheral IV  Procedures and diagnostic studies:   Dg Ankle Complete Left 12/21/2014 No fracture.   Dg Knee Complete 4 Views Left 12/21/2014 No fracture or effusion. 2. Mild degenerative changes. 3. Mild chondrocalcinosis.   Medical Consultants:  None  Other Consultants:  PT   IAnti-Infectives:   Rocephin 8/18 --> 8/21      Discharge Exam: Filed Vitals:   12/25/14 0951  BP: 132/61  Pulse:   Temp:   Resp:    Filed Vitals:   12/24/14 2115 12/25/14 0254 12/25/14 0540 12/25/14 0951  BP: 172/64 164/70 168/79 132/61  Pulse: 56 51 57   Temp: 98.5 F (36.9 C) 98 F (36.7 C) 97.6 F (36.4 C)   TempSrc: Oral Oral Oral   Resp: 18 19 18    Height:      Weight:   100.3 kg (221 lb 1.9 oz)   SpO2: 100% 100% 99%  General: Pt is alert, follows commands appropriately, not in acute distress Cardiovascular: Regular rate and rhythm, no rubs, no gallops Respiratory: Clear to auscultation bilaterally, no wheezing, no crackles, no rhonchi Abdominal: Soft, non tender, non distended, bowel sounds +, no guarding   Discharge Instructions  Discharge Instructions    Diet - low sodium heart healthy    Complete by:  As directed      Increase activity slowly    Complete by:  As directed             Medication List    STOP taking these medications        ciprofloxacin 500 MG tablet  Commonly known  as:  CIPRO     irbesartan 150 MG tablet  Commonly known as:  AVAPRO      TAKE these medications        allopurinol 100 MG tablet  Commonly known as:  ZYLOPRIM  Take 100 mg by mouth daily.     amLODipine 5 MG tablet  Commonly known as:  NORVASC  Take 5 mg by mouth daily.     aspirin EC 81 MG tablet  Take 81 mg by mouth daily.     divalproex 250 MG DR tablet  Commonly known as:  DEPAKOTE  Take 250-500 mg by mouth 2 (two) times daily. Take one tablet in the am and 2 tablets in the pm     donepezil 10 MG tablet  Commonly known as:  ARICEPT  Take 10 mg by mouth at bedtime.     Erythromycin 2 % ointment  Apply to affected area 2 times daily     erythromycin ophthalmic ointment  Place a 1/2 inch ribbon of ointment into the lower eyelid.     gabapentin 800 MG tablet  Commonly known as:  NEURONTIN  Take 800 mg by mouth 3 (three) times daily.     hydrALAZINE 25 MG tablet  Commonly known as:  APRESOLINE  Take 1 tablet (25 mg total) by mouth every 8 (eight) hours.     hydrochlorothiazide 12.5 MG capsule  Commonly known as:  MICROZIDE  Take 12.5 mg by mouth daily.     oxybutynin 5 MG tablet  Commonly known as:  DITROPAN  Take 5 mg by mouth 3 (three) times daily.     oxyCODONE-acetaminophen 5-325 MG per tablet  Commonly known as:  PERCOCET/ROXICET  Take 1-2 tablets by mouth every 3 (three) hours as needed for moderate pain.     PARoxetine 20 MG tablet  Commonly known as:  PAXIL  Take 20 mg by mouth daily.     pravastatin 40 MG tablet  Commonly known as:  PRAVACHOL  Take 40 mg by mouth daily.           Follow-up Information    Follow up with [facility], [place].   Specialty:  Internal Medicine   Contact information:   [address] [place] [address] [phone]        The results of significant diagnostics from this hospitalization (including imaging, microbiology, ancillary and laboratory) are listed below for reference.      Microbiology: Recent Results (from the past 240 hour(s))  Urine culture     Status: None   Collection Time: 12/21/14  2:09 PM  Result Value Ref Range Status   Specimen Description URINE, CLEAN CATCH  Final   Special Requests NONE  Final   Culture   Final    >=100,000 COLONIES/mL STAPHYLOCOCCUS AUREUS Performed at  [facility]    Report Status 12/24/2014 FINAL  Final   Organism ID, Bacteria STAPHYLOCOCCUS AUREUS  Final      Susceptibility   Staphylococcus aureus - MIC*    CIPROFLOXACIN >=8 RESISTANT Resistant     GENTAMICIN <=0.5 SENSITIVE Sensitive     NITROFURANTOIN <=16 SENSITIVE Sensitive     OXACILLIN 2 SENSITIVE Sensitive     TETRACYCLINE <=1 SENSITIVE Sensitive     VANCOMYCIN <=0.5 SENSITIVE Sensitive     TRIMETH/SULFA <=10 SENSITIVE Sensitive     CLINDAMYCIN <=0.25 SENSITIVE Sensitive     RIFAMPIN <=0.5 SENSITIVE Sensitive     Inducible Clindamycin NEGATIVE Sensitive     * >=100,000 COLONIES/mL STAPHYLOCOCCUS AUREUS     Labs: Basic Metabolic Panel:  Recent Labs Lab 12/21/14 1414 12/22/14 0455 12/23/14 0501 12/25/14 0508  NA 135 134* 131* 132*  K 4.8 4.0 3.7 4.0  CL 103 101 100* 100*  CO2 23 25 24 24   GLUCOSE 97 90 101* 92  BUN 35* 30* 25* 25*  CREATININE 1.69* 1.55* 1.22 1.32*  CALCIUM 8.6* 8.3* 8.1* 8.3*   CBC:  Recent Labs Lab 12/21/14 1414 12/22/14 0455 12/23/14 0501 12/25/14 0508  WBC 5.5 5.6 6.4 5.6  HGB 9.5* 9.4* 9.8* 10.1*  HCT 30.0* 30.1* 30.2* 31.5*  MCV 79.4 79.4 78.9 78.4  PLT 210 192 190 211    SIGNED: Time coordinating discharge: 30 minutes  MAGICK-Olivene Cookston, MD  [facility] 12/25/2014, 10:57 AM Pager [phone]  If 7PM-7AM, please contact night-coverage [email or web address] Password [email or web address]

## 2014-12-25 NOTE — Progress Notes (Signed)
Physical Therapy Treatment Patient Details Name: Manuel King MRN: [id number] DOB: Aug 27, 1929 Today's Date: 12/25/2014    History of Present Illness 81 yo male with hx of HTN, CVA, dementia, admitted after progressive weakness afer recent fall and subsequent left ankle sprain, UTI    PT Comments    Progressing slowly with mobility. Min assist for mobility. Continue to recommend ST rehab at [facility]. If pt discharges home, will need 24 hour assist.   Follow Up Recommendations  SNF;Supervision/Assistance - 24 hour     Equipment Recommendations  None recommended by PT    Recommendations for Other Services       Precautions / Restrictions Precautions Precautions: Fall Precaution Comments: urinary incontinence Required Braces or Orthoses: Other Brace/Splint Other Brace/Splint: air cast splint on L ankle Restrictions Weight Bearing Restrictions: No LLE Weight Bearing: Weight bearing as tolerated    Mobility  Bed Mobility Overal bed mobility: Needs Assistance Bed Mobility: Supine to Sit     Supine to sit: Min assist;HOB elevated     General bed mobility comments: small amount for trunk to upright. Mod use of bedrail. Increased time.   Transfers Overall transfer level: Needs assistance Equipment used: Rolling walker (2 wheeled) Transfers: Sit to/from Stand Sit to Stand: From elevated surface;Min assist Stand pivot transfers: Min assist       General transfer comment: Assist to rise, stabilize, control descent. VCs safety, technique, hand placement. Stand pivot from bed to [facility] with RW.   Ambulation/Gait Ambulation/Gait assistance: Min assist Ambulation Distance (Feet): 3 Feet Assistive device: Rolling walker (2 wheeled) Gait Pattern/deviations: Step-to pattern;Decreased stance time - left     General Gait Details: very slow gait speed. Pt fatigues quickly. Noted limited WBing L LE. VCs safety, technique.    Stairs            Wheelchair Mobility     Modified Rankin (Stroke Patients Only)       Balance Overall balance assessment: Needs assistance         Standing balance support: Bilateral upper extremity supported;During functional activity Standing balance-Leahy Scale: Poor Standing balance comment: needs RW                    Cognition Arousal/Alertness: Awake/alert Behavior During Therapy: WFL for tasks assessed/performed Overall Cognitive Status: History of cognitive impairments - at baseline                      Exercises      General Comments        Pertinent Vitals/Pain Pain Assessment: Faces Faces Pain Scale: Hurts little more Pain Location: L ankle. Pt denies pain but he limits WBing and gait appears antalgic Pain Descriptors / Indicators: Sore Pain Intervention(s): Limited activity within patient's tolerance;Repositioned    Home Living                      Prior Function            PT Goals (current goals can now be found in the care plan section) Progress towards PT goals: Progressing toward goals (slowly)    Frequency  Min 3X/week    PT Plan Current plan remains appropriate    Co-evaluation             End of Session Equipment Utilized During Treatment: Gait belt;Other (comment) (L ankle air splint) Activity Tolerance: Patient limited by fatigue;Patient limited by pain Patient left: in chair;with call bell/phone within reach;with  chair alarm set     Time: 0623-7628 PT Time Calculation (min) (ACUTE ONLY): 17 min  Charges:  $Therapeutic Activity: 8-22 mins                    G Codes:      Weston Anna, MPT Pager: [phone]

## 2014-12-25 NOTE — Progress Notes (Signed)
Clinical Social Work  CSW faxed DC summary to [facility] who is agreeable to accept patient today. CSW prepared DC packet with FL2, DC summary and hard scripts included. CSW spoke with patient and family who are happy about DC to SNF today. Patient is hopeful to only have to stay for a few weeks. Patient and family request PTAR and aware of no guarantee of payment. PTAR arranged for 1:15pm. Request #: [id number].  CSW is signing off but available if needed.  [place], [place] [phone]

## 2014-12-25 NOTE — Clinical Social Work Placement (Signed)
   CLINICAL SOCIAL WORK PLACEMENT  NOTE  Date:  12/25/2014  Patient Details  Name: Manuel King MRN: [id number] Date of Birth: 01-Mar-1930  Clinical Social Work is seeking post-discharge placement for this patient at the [place] level of care (*CSW will initial, date and re-position this form in  chart as items are completed):  Yes   Patient/family provided with [facility] Work Department's list of facilities offering this level of care within the geographic area requested by the patient (or if unable, by the patient's family).  Yes   Patient/family informed of their freedom to choose among providers that offer the needed level of care, that participate in Medicare, Medicaid or managed care program needed by the patient, have an available bed and are willing to accept the patient.  Yes   Patient/family informed of [facility]'s ownership interest in [facility] and [facility], as well as of the fact that they are under no obligation to receive care at these facilities.  PASRR submitted to EDS on       PASRR number received on       Existing PASRR number confirmed on 12/23/14     FL2 transmitted to all facilities in geographic area requested by pt/family on 12/23/14     FL2 transmitted to all facilities within larger geographic area on       Patient informed that his/her managed care company has contracts with or will negotiate with certain facilities, including the following:        Yes   Patient/family informed of bed offers received.  Patient chooses bed at [facility]     Physician recommends and patient chooses bed at      Patient to be transferred to [facility] on 12/25/14.  Patient to be transferred to facility by PTAR     Patient family notified on 12/25/14 of transfer.  Name of family member notified:  Dtr-Marie at bedside     PHYSICIAN       Additional Comment:     _______________________________________________ Boone Master, Kennan 12/25/2014, 11:51 AM

## 2014-12-25 NOTE — Discharge Instructions (Signed)

## 2014-12-26 ENCOUNTER — Non-Acute Institutional Stay (SKILLED_NURSING_FACILITY): Payer: Medicare Other | Admitting: Adult Health

## 2014-12-26 ENCOUNTER — Encounter: Payer: Self-pay | Admitting: Adult Health

## 2014-12-26 DIAGNOSIS — F419 Anxiety disorder, unspecified: Secondary | ICD-10-CM

## 2014-12-26 DIAGNOSIS — N183 Chronic kidney disease, stage 3 unspecified: Secondary | ICD-10-CM

## 2014-12-26 DIAGNOSIS — G629 Polyneuropathy, unspecified: Secondary | ICD-10-CM

## 2014-12-26 DIAGNOSIS — F0391 Unspecified dementia with behavioral disturbance: Secondary | ICD-10-CM | POA: Diagnosis not present

## 2014-12-26 DIAGNOSIS — R531 Weakness: Secondary | ICD-10-CM

## 2014-12-26 DIAGNOSIS — S93402S Sprain of unspecified ligament of left ankle, sequela: Secondary | ICD-10-CM

## 2014-12-26 DIAGNOSIS — E785 Hyperlipidemia, unspecified: Secondary | ICD-10-CM | POA: Diagnosis not present

## 2014-12-26 DIAGNOSIS — H109 Unspecified conjunctivitis: Secondary | ICD-10-CM | POA: Diagnosis not present

## 2014-12-26 DIAGNOSIS — E46 Unspecified protein-calorie malnutrition: Secondary | ICD-10-CM

## 2014-12-26 DIAGNOSIS — I10 Essential (primary) hypertension: Secondary | ICD-10-CM | POA: Diagnosis not present

## 2014-12-26 DIAGNOSIS — R3915 Urgency of urination: Secondary | ICD-10-CM | POA: Diagnosis not present

## 2014-12-26 DIAGNOSIS — N390 Urinary tract infection, site not specified: Secondary | ICD-10-CM

## 2014-12-26 DIAGNOSIS — M1000 Idiopathic gout, unspecified site: Secondary | ICD-10-CM | POA: Diagnosis not present

## 2014-12-26 LAB — URINE CULTURE: Culture: NO GROWTH

## 2014-12-29 ENCOUNTER — Non-Acute Institutional Stay (SKILLED_NURSING_FACILITY): Payer: Medicare Other | Admitting: Internal Medicine

## 2014-12-29 DIAGNOSIS — M1A9XX0 Chronic gout, unspecified, without tophus (tophi): Secondary | ICD-10-CM

## 2014-12-29 DIAGNOSIS — N3000 Acute cystitis without hematuria: Secondary | ICD-10-CM | POA: Diagnosis not present

## 2014-12-29 DIAGNOSIS — E785 Hyperlipidemia, unspecified: Secondary | ICD-10-CM

## 2014-12-29 DIAGNOSIS — N179 Acute kidney failure, unspecified: Secondary | ICD-10-CM

## 2014-12-29 DIAGNOSIS — Z8673 Personal history of transient ischemic attack (TIA), and cerebral infarction without residual deficits: Secondary | ICD-10-CM

## 2014-12-29 DIAGNOSIS — E871 Hypo-osmolality and hyponatremia: Secondary | ICD-10-CM | POA: Diagnosis not present

## 2014-12-29 DIAGNOSIS — R2681 Unsteadiness on feet: Secondary | ICD-10-CM | POA: Diagnosis not present

## 2014-12-29 DIAGNOSIS — S93402D Sprain of unspecified ligament of left ankle, subsequent encounter: Secondary | ICD-10-CM | POA: Diagnosis not present

## 2014-12-29 DIAGNOSIS — F0150 Vascular dementia without behavioral disturbance: Secondary | ICD-10-CM

## 2014-12-29 DIAGNOSIS — I10 Essential (primary) hypertension: Secondary | ICD-10-CM

## 2014-12-29 DIAGNOSIS — F39 Unspecified mood [affective] disorder: Secondary | ICD-10-CM | POA: Diagnosis not present

## 2014-12-29 DIAGNOSIS — D62 Acute posthemorrhagic anemia: Secondary | ICD-10-CM

## 2015-01-01 NOTE — Progress Notes (Signed)
Patient ID: Manuel King, male   DOB: April 09, 1930, 81 y.o.   MRN: [id number]     [facility]   PCP: [facility], MD  Code Status: full code  No Known Allergies  Chief Complaint  Patient presents with  . New Admit To SNF     HPI:  81 y.o. patient is here for short term rehabilitation post hospital admission from 12/21/14-12/25/14 with left ankle sprain post fall and UTI. Fracture was ruled out and he has a air cast to his left ankle. He was treated with rocephin for his UTI.He has PMH of HTN, CVA, dementia. He is seen in his room today. He denies any concern. No new concern from staff. No fall in facility. Needs supervision.  Review of Systems:  Constitutional: Negative for fever, chills, diaphoresis.  HENT: Negative for headache, congestion, nasal discharge Eyes: Negative for eye pain, blurred vision, double vision and discharge.  Respiratory: Negative for cough, shortness of breath and wheezing.   Cardiovascular: Negative for chest pain, palpitations, leg swelling.  Gastrointestinal: Negative for heartburn, nausea, vomiting, abdominal pain Genitourinary: Negative for dysuria Musculoskeletal: Negative for back pain Skin: Negative for itching, rash.  Neurological: Negative for dizziness, tingling, focal weakness   Past Medical History  Diagnosis Date  . Hypertension   . CVA (cerebral vascular accident)   . Dementia   . Prostate cancer   . Cellulitis currently    right foot  . Arthritis    Past Surgical History  Procedure Laterality Date  . Neck surgery    . Back surgery    . Spider bite surgery      on left leg  . Prostate seed implants     Social History:   reports that he has quit smoking. He has never used smokeless tobacco. He reports that he does not drink alcohol or use illicit drugs.  Family History  Problem Relation Age of Onset  . Hypotension Mother   . Diabetes type II Mother   . CAD Mother   . Hypotension  Father   . CAD Father   . CAD Sister     Medications:   Medication List       This list is accurate as of: 12/29/14 11:59 PM.  Always use your most recent med list.               allopurinol 100 MG tablet  Commonly known as:  ZYLOPRIM  Take 100 mg by mouth daily.     amLODipine 5 MG tablet  Commonly known as:  NORVASC  Take 5 mg by mouth daily.     aspirin EC 81 MG tablet  Take 81 mg by mouth daily.     divalproex 250 MG DR tablet  Commonly known as:  DEPAKOTE  Take 250-500 mg by mouth 2 (two) times daily. Take one tablet in the am and 2 tablets in the pm     donepezil 10 MG tablet  Commonly known as:  ARICEPT  Take 10 mg by mouth at bedtime.     Erythromycin 2 % ointment  Apply to affected area 2 times daily     erythromycin ophthalmic ointment  Place a 1/2 inch ribbon of ointment into the lower eyelid.     gabapentin 800 MG tablet  Commonly known as:  NEURONTIN  Take 800 mg by mouth 3 (three) times daily.     hydrALAZINE 25 MG tablet  Commonly known as:  APRESOLINE  Take 1  tablet (25 mg total) by mouth every 8 (eight) hours.     hydrochlorothiazide 12.5 MG capsule  Commonly known as:  MICROZIDE  Take 12.5 mg by mouth daily.     oxybutynin 5 MG tablet  Commonly known as:  DITROPAN  Take 5 mg by mouth 3 (three) times daily.     oxyCODONE-acetaminophen 5-325 MG per tablet  Commonly known as:  PERCOCET/ROXICET  Take 1-2 tablets by mouth every 3 (three) hours as needed for moderate pain.     PARoxetine 20 MG tablet  Commonly known as:  PAXIL  Take 20 mg by mouth daily.     pravastatin 40 MG tablet  Commonly known as:  PRAVACHOL  Take 40 mg by mouth daily.         Physical Exam: Filed Vitals:   12/29/14 2048  BP: 140/60  Pulse: 61  Temp: 99 F (37.2 C)  Resp: 18  Weight: 221 lb (100.245 kg)  SpO2: 99%    General- elderly male, in no acute distress Head- normocephalic, atraumatic Nose- normal nasal mucosa Throat- moist mucus  membrane Eyes- PERRLA, EOMI, no pallor, no icterus, no discharge, normal conjunctiva, normal sclera Neck- no cervical lymphadenopathy Cardiovascular- normal s1,s2, no murmurs Respiratory- bilateral clear to auscultation, no wheeze, no rhonchi, no crackles, no use of accessory muscles Abdomen- bowel sounds present, soft, non tender Musculoskeletal- able to move all 4 extremities, NWB LLE, left leg cast present, able to move his toes, good circulation, on WC Neurological- no focal deficit, alert and oriented to person Skin- warm and dry Psychiatry- normal mood and affect    Labs reviewed: Basic Metabolic Panel:  Recent Labs  02/02/14 0640  12/22/14 0455 12/23/14 0501 12/25/14 0508  NA 143  < > 134* 131* 132*  K 4.0  < > 4.0 3.7 4.0  CL 108  < > 101 100* 100*  CO2 21  < > 25 24 24   GLUCOSE 100*  < > 90 101* 92  BUN 34*  < > 30* 25* 25*  CREATININE 1.61*  < > 1.55* 1.22 1.32*  CALCIUM 8.4  < > 8.3* 8.1* 8.3*  MG 2.1  --   --   --   --   PHOS 4.6  --   --   --   --   < > = values in this interval not displayed. Liver Function Tests:  Recent Labs  02/02/14 0640 07/17/14 2149 09/29/14 1313  AST 8 15 12*  ALT <5 8 8*  ALKPHOS 66 61 59  BILITOT 0.3 0.5 0.4  PROT 6.8 6.9 6.8  ALBUMIN 2.7* 3.2* 3.0*   No results for input(s): LIPASE, AMYLASE in the last 8760 hours. No results for input(s): AMMONIA in the last 8760 hours. CBC:  Recent Labs  02/01/14 1938  07/17/14 2149  12/22/14 0455 12/23/14 0501 12/25/14 0508  WBC 5.5  < > 5.3  < > 5.6 6.4 5.6  NEUTROABS 2.8  --  2.8  --   --   --   --   HGB 11.1*  < > 10.5*  < > 9.4* 9.8* 10.1*  HCT 33.2*  < > 33.1*  < > 30.1* 30.2* 31.5*  MCV 80.8  < > 80.5  < > 79.4 78.9 78.4  PLT 188  < > 186  < > 192 190 211  < > = values in this interval not displayed. Cardiac Enzymes:  Recent Labs  02/02/14 0640 02/02/14 1204 07/17/14 2149  TROPONINI <0.30 <0.30 <  0.03   12/26/14 hb 8.9, hct 27, wbc 3.5, plt 182, na 131, k 4.3, bun  25, cr 1.32, glu 81   Radiological Exams: Dg Ankle Complete Left  12/21/2014   CLINICAL DATA:  Left ankle pain after falling when getting out of bed at home today.  EXAM: LEFT ANKLE COMPLETE - 3+ VIEW  COMPARISON:  None.  FINDINGS: Diffuse soft tissue swelling.  No fracture, dislocation or effusion.  IMPRESSION: No fracture.   Electronically Signed   By: Claudie Revering M.D.   On: 12/21/2014 15:40   Dg Knee Complete 4 Views Left  12/21/2014   CLINICAL DATA:  Left knee pain following a fall getting out of bed today at home.  EXAM: LEFT KNEE - COMPLETE 4+ VIEW  COMPARISON:  None.  FINDINGS: Mild medial and lateral joint space narrowing and mild medial spur formation. Mild cartilage calcification. No fracture, dislocation or effusion. Mild atheromatous arterial calcifications.  IMPRESSION: 1. No fracture or effusion. 2. Mild degenerative changes. 3. Mild chondrocalcinosis.   Electronically Signed   By: Claudie Revering M.D.   On: 12/21/2014 15:39    Assessment/Plan  Unsteady gait Post left ankle sprain. Continue to wear the air cast to left leg and NWB to LLE. Will have patient work with PT/OT as tolerated to regain strength and restore function.  Fall precautions are in place.fall precautions  Left ankle sprain Continue to wear air cast to LLE and NWB. To work with therapy team. Continue oxycodone-apap 5-325 mg q3 h prn pain  UTI Completed 5 days of rocephin, urine culture grew MSSA. No further antibiotic recommended by ID. Monitor clinically  Acute blood loss anemia With drop in Hb from 10.1 to 8.9, change aspirin to EC. No signs of active gi bleed on exam. Check guaiac stool to rule out gi bleed. Start ferrous sulfate 325 mg bid and recheck cbc 01/01/15.  Renal failure D/c hctz as this will worsen renal impairment. Monitor bmp  Hyponatremia Monitor clinically, d/c hctz as this can worsen hyponatremia  HTN Stable bp, continue norvasc 5 mg daily, hydralazine 25 mg tid and monitor bp. D/c  hctz  Chronic gout No recent flare up. Continue allopurinol 100 mg daily  Mood disorder Unspecified. Currently on depakote 250 mg am and 500 mg pm with paxil 20 mg daily  Dementia Stable, continue aricept for now and to provide assistance with ADLs.   HLD Continue pravachol 40 mg daily  Old cva Continue baby aspirin but change to EC. Continue statin and bp medication   Goals of care: short term rehabilitation   Labs/tests ordered:  Family/ staff Communication: reviewed care plan with patient and nursing supervisor    Blanchie Serve, MD  [place] [phone] (Monday-Friday 8 am - 5 pm) [phone] (afterhours)

## 2015-01-12 ENCOUNTER — Encounter: Payer: Self-pay | Admitting: Adult Health

## 2015-01-12 ENCOUNTER — Non-Acute Institutional Stay (SKILLED_NURSING_FACILITY): Payer: Medicare Other | Admitting: Adult Health

## 2015-01-12 DIAGNOSIS — F0391 Unspecified dementia with behavioral disturbance: Secondary | ICD-10-CM | POA: Diagnosis not present

## 2015-01-12 DIAGNOSIS — R3915 Urgency of urination: Secondary | ICD-10-CM

## 2015-01-12 DIAGNOSIS — F419 Anxiety disorder, unspecified: Secondary | ICD-10-CM

## 2015-01-12 DIAGNOSIS — N183 Chronic kidney disease, stage 3 unspecified: Secondary | ICD-10-CM

## 2015-01-12 DIAGNOSIS — N390 Urinary tract infection, site not specified: Secondary | ICD-10-CM | POA: Diagnosis not present

## 2015-01-12 DIAGNOSIS — R531 Weakness: Secondary | ICD-10-CM | POA: Diagnosis not present

## 2015-01-12 DIAGNOSIS — S93402D Sprain of unspecified ligament of left ankle, subsequent encounter: Secondary | ICD-10-CM

## 2015-01-12 DIAGNOSIS — G629 Polyneuropathy, unspecified: Secondary | ICD-10-CM

## 2015-01-12 DIAGNOSIS — M1A9XX0 Chronic gout, unspecified, without tophus (tophi): Secondary | ICD-10-CM | POA: Diagnosis not present

## 2015-01-12 DIAGNOSIS — E785 Hyperlipidemia, unspecified: Secondary | ICD-10-CM

## 2015-01-12 DIAGNOSIS — I10 Essential (primary) hypertension: Secondary | ICD-10-CM | POA: Diagnosis not present

## 2015-01-12 DIAGNOSIS — D509 Iron deficiency anemia, unspecified: Secondary | ICD-10-CM | POA: Diagnosis not present

## 2015-01-17 DIAGNOSIS — I129 Hypertensive chronic kidney disease with stage 1 through stage 4 chronic kidney disease, or unspecified chronic kidney disease: Secondary | ICD-10-CM | POA: Diagnosis not present

## 2015-01-17 DIAGNOSIS — S93402D Sprain of unspecified ligament of left ankle, subsequent encounter: Secondary | ICD-10-CM | POA: Diagnosis not present

## 2015-01-17 DIAGNOSIS — F0390 Unspecified dementia without behavioral disturbance: Secondary | ICD-10-CM | POA: Diagnosis not present

## 2015-01-17 DIAGNOSIS — Z87440 Personal history of urinary (tract) infections: Secondary | ICD-10-CM | POA: Diagnosis not present

## 2015-01-17 DIAGNOSIS — Z8673 Personal history of transient ischemic attack (TIA), and cerebral infarction without residual deficits: Secondary | ICD-10-CM | POA: Diagnosis not present

## 2015-01-17 DIAGNOSIS — N183 Chronic kidney disease, stage 3 (moderate): Secondary | ICD-10-CM | POA: Diagnosis not present

## 2015-01-17 DIAGNOSIS — Z7982 Long term (current) use of aspirin: Secondary | ICD-10-CM | POA: Diagnosis not present

## 2015-01-17 DIAGNOSIS — Z9181 History of falling: Secondary | ICD-10-CM | POA: Diagnosis not present

## 2015-01-18 DIAGNOSIS — N183 Chronic kidney disease, stage 3 (moderate): Secondary | ICD-10-CM | POA: Diagnosis not present

## 2015-01-18 DIAGNOSIS — Z8673 Personal history of transient ischemic attack (TIA), and cerebral infarction without residual deficits: Secondary | ICD-10-CM | POA: Diagnosis not present

## 2015-01-18 DIAGNOSIS — S93402D Sprain of unspecified ligament of left ankle, subsequent encounter: Secondary | ICD-10-CM | POA: Diagnosis not present

## 2015-01-18 DIAGNOSIS — I129 Hypertensive chronic kidney disease with stage 1 through stage 4 chronic kidney disease, or unspecified chronic kidney disease: Secondary | ICD-10-CM | POA: Diagnosis not present

## 2015-01-18 DIAGNOSIS — Z87440 Personal history of urinary (tract) infections: Secondary | ICD-10-CM | POA: Diagnosis not present

## 2015-01-18 DIAGNOSIS — Z7982 Long term (current) use of aspirin: Secondary | ICD-10-CM | POA: Diagnosis not present

## 2015-01-18 DIAGNOSIS — F0390 Unspecified dementia without behavioral disturbance: Secondary | ICD-10-CM | POA: Diagnosis not present

## 2015-01-18 DIAGNOSIS — Z9181 History of falling: Secondary | ICD-10-CM | POA: Diagnosis not present

## 2015-01-19 DIAGNOSIS — Z8673 Personal history of transient ischemic attack (TIA), and cerebral infarction without residual deficits: Secondary | ICD-10-CM | POA: Diagnosis not present

## 2015-01-19 DIAGNOSIS — I129 Hypertensive chronic kidney disease with stage 1 through stage 4 chronic kidney disease, or unspecified chronic kidney disease: Secondary | ICD-10-CM | POA: Diagnosis not present

## 2015-01-19 DIAGNOSIS — C61 Malignant neoplasm of prostate: Secondary | ICD-10-CM | POA: Diagnosis not present

## 2015-01-19 DIAGNOSIS — F0280 Dementia in other diseases classified elsewhere without behavioral disturbance: Secondary | ICD-10-CM | POA: Diagnosis not present

## 2015-01-19 DIAGNOSIS — Z7982 Long term (current) use of aspirin: Secondary | ICD-10-CM | POA: Diagnosis not present

## 2015-01-19 DIAGNOSIS — F0390 Unspecified dementia without behavioral disturbance: Secondary | ICD-10-CM | POA: Diagnosis not present

## 2015-01-19 DIAGNOSIS — I679 Cerebrovascular disease, unspecified: Secondary | ICD-10-CM | POA: Diagnosis not present

## 2015-01-19 DIAGNOSIS — S93402D Sprain of unspecified ligament of left ankle, subsequent encounter: Secondary | ICD-10-CM | POA: Diagnosis not present

## 2015-01-19 DIAGNOSIS — Z87440 Personal history of urinary (tract) infections: Secondary | ICD-10-CM | POA: Diagnosis not present

## 2015-01-19 DIAGNOSIS — N183 Chronic kidney disease, stage 3 (moderate): Secondary | ICD-10-CM | POA: Diagnosis not present

## 2015-01-19 DIAGNOSIS — I6789 Other cerebrovascular disease: Secondary | ICD-10-CM | POA: Diagnosis not present

## 2015-01-19 DIAGNOSIS — N179 Acute kidney failure, unspecified: Secondary | ICD-10-CM | POA: Diagnosis not present

## 2015-01-19 DIAGNOSIS — Z9181 History of falling: Secondary | ICD-10-CM | POA: Diagnosis not present

## 2015-01-22 DIAGNOSIS — I129 Hypertensive chronic kidney disease with stage 1 through stage 4 chronic kidney disease, or unspecified chronic kidney disease: Secondary | ICD-10-CM | POA: Diagnosis not present

## 2015-01-22 DIAGNOSIS — Z7982 Long term (current) use of aspirin: Secondary | ICD-10-CM | POA: Diagnosis not present

## 2015-01-22 DIAGNOSIS — Z9181 History of falling: Secondary | ICD-10-CM | POA: Diagnosis not present

## 2015-01-22 DIAGNOSIS — Z8673 Personal history of transient ischemic attack (TIA), and cerebral infarction without residual deficits: Secondary | ICD-10-CM | POA: Diagnosis not present

## 2015-01-22 DIAGNOSIS — N183 Chronic kidney disease, stage 3 (moderate): Secondary | ICD-10-CM | POA: Diagnosis not present

## 2015-01-22 DIAGNOSIS — Z87440 Personal history of urinary (tract) infections: Secondary | ICD-10-CM | POA: Diagnosis not present

## 2015-01-22 DIAGNOSIS — S93402D Sprain of unspecified ligament of left ankle, subsequent encounter: Secondary | ICD-10-CM | POA: Diagnosis not present

## 2015-01-22 DIAGNOSIS — F0390 Unspecified dementia without behavioral disturbance: Secondary | ICD-10-CM | POA: Diagnosis not present

## 2015-01-23 DIAGNOSIS — Z9181 History of falling: Secondary | ICD-10-CM | POA: Diagnosis not present

## 2015-01-23 DIAGNOSIS — I129 Hypertensive chronic kidney disease with stage 1 through stage 4 chronic kidney disease, or unspecified chronic kidney disease: Secondary | ICD-10-CM | POA: Diagnosis not present

## 2015-01-23 DIAGNOSIS — Z7982 Long term (current) use of aspirin: Secondary | ICD-10-CM | POA: Diagnosis not present

## 2015-01-23 DIAGNOSIS — Z8673 Personal history of transient ischemic attack (TIA), and cerebral infarction without residual deficits: Secondary | ICD-10-CM | POA: Diagnosis not present

## 2015-01-23 DIAGNOSIS — S93402D Sprain of unspecified ligament of left ankle, subsequent encounter: Secondary | ICD-10-CM | POA: Diagnosis not present

## 2015-01-23 DIAGNOSIS — Z87440 Personal history of urinary (tract) infections: Secondary | ICD-10-CM | POA: Diagnosis not present

## 2015-01-23 DIAGNOSIS — N183 Chronic kidney disease, stage 3 (moderate): Secondary | ICD-10-CM | POA: Diagnosis not present

## 2015-01-23 DIAGNOSIS — F0390 Unspecified dementia without behavioral disturbance: Secondary | ICD-10-CM | POA: Diagnosis not present

## 2015-01-24 DIAGNOSIS — N183 Chronic kidney disease, stage 3 (moderate): Secondary | ICD-10-CM | POA: Diagnosis not present

## 2015-01-24 DIAGNOSIS — F0390 Unspecified dementia without behavioral disturbance: Secondary | ICD-10-CM | POA: Diagnosis not present

## 2015-01-24 DIAGNOSIS — I129 Hypertensive chronic kidney disease with stage 1 through stage 4 chronic kidney disease, or unspecified chronic kidney disease: Secondary | ICD-10-CM | POA: Diagnosis not present

## 2015-01-24 DIAGNOSIS — Z9181 History of falling: Secondary | ICD-10-CM | POA: Diagnosis not present

## 2015-01-24 DIAGNOSIS — Z8673 Personal history of transient ischemic attack (TIA), and cerebral infarction without residual deficits: Secondary | ICD-10-CM | POA: Diagnosis not present

## 2015-01-24 DIAGNOSIS — Z87440 Personal history of urinary (tract) infections: Secondary | ICD-10-CM | POA: Diagnosis not present

## 2015-01-24 DIAGNOSIS — S93402D Sprain of unspecified ligament of left ankle, subsequent encounter: Secondary | ICD-10-CM | POA: Diagnosis not present

## 2015-01-24 DIAGNOSIS — Z7982 Long term (current) use of aspirin: Secondary | ICD-10-CM | POA: Diagnosis not present

## 2015-01-25 DIAGNOSIS — R3915 Urgency of urination: Secondary | ICD-10-CM | POA: Insufficient documentation

## 2015-01-25 NOTE — Progress Notes (Addendum)
Patient ID: Manuel King, male   DOB: [birth year], 81 y.o.   MRN: [id number]    DATE:  12/26/14 MRN:  [id number]  BIRTHDAY: 05/25/1929  Facility:  Nursing Home Location:  [place] Room Number: 003-7  LEVEL OF CARE:  SNF [phone])  Contact Information    Name Relation Home Work [place] Daughter [phone]  [phone]   [place] Daughter [phone]  [phone]      Chief Complaint  Patient presents with  . Hospitalization Follow-up    Generalized weakness, left ankle sprain, UTI, chronic kidney disease stage III, dementia, hyperlipidemia, hypertension, gout, conjunctivitis, neuropathy, urinary urgency, anxiety, and protein calorie malnutrition    HISTORY OF PRESENT ILLNESS:  This is an 81 year old male who has been admitted to [facility] on 12/25/14 from [facility]. He has PMH of hypertension, CVA and dementia. He was having progressive weakness after recent fall sustaining a left ankle sprain. He was diagnosed with UTI and started on Rocephin in the ED.  He has been admitted for a short-term rehabilitation.  PAST MEDICAL HISTORY:  Past Medical History  Diagnosis Date  . Hypertension   . CVA (cerebral vascular accident)   . Dementia   . Prostate cancer   . Cellulitis currently    right foot  . Arthritis      CURRENT MEDICATIONS: Reviewed  Patient's Medications  New Prescriptions   No medications on file  Previous Medications   ALLOPURINOL (ZYLOPRIM) 100 MG TABLET    Take 100 mg by mouth daily.   AMLODIPINE (NORVASC) 5 MG TABLET    Take 5 mg by mouth daily.   ASPIRIN EC 81 MG TABLET    Take 81 mg by mouth daily.   DIVALPROEX (DEPAKOTE) 250 MG DR TABLET    Take 250-500 mg by mouth 2 (two) times daily. Take one tablet in the am and 2 tablets in the pm   DONEPEZIL (ARICEPT) 10 MG TABLET    Take 10 mg by mouth at bedtime.   ERYTHROMYCIN 2 % OINTMENT    Apply to affected area 2 times daily   ERYTHROMYCIN  OPHTHALMIC OINTMENT    Place a 1/2 inch ribbon of ointment into the lower eyelid.   GABAPENTIN (NEURONTIN) 800 MG TABLET    Take 800 mg by mouth 3 (three) times daily.   HYDRALAZINE (APRESOLINE) 25 MG TABLET    Take 1 tablet (25 mg total) by mouth every 8 (eight) hours.   HYDROCHLOROTHIAZIDE (MICROZIDE) 12.5 MG CAPSULE    Take 12.5 mg by mouth daily.   OXYBUTYNIN (DITROPAN) 5 MG TABLET    Take 5 mg by mouth 3 (three) times daily.   OXYCODONE-ACETAMINOPHEN (PERCOCET/ROXICET) 5-325 MG PER TABLET    Take 1-2 tablets by mouth every 3 (three) hours as needed for moderate pain.   PAROXETINE (PAXIL) 20 MG TABLET    Take 20 mg by mouth daily.   PRAVASTATIN (PRAVACHOL) 40 MG TABLET    Take 40 mg by mouth daily.  Modified Medications   No medications on file  Discontinued Medications   No medications on file     No Known Allergies   REVIEW OF SYSTEMS:  GENERAL: no change in appetite, no fatigue, no weight changes, no fever, chills or weakness EYES: Denies change in vision, dry eyes, eye pain, itching or discharge EARS: Denies change in hearing, ringing in ears, or earache NOSE: Denies nasal congestion or epistaxis MOUTH and THROAT: Denies oral discomfort,  gingival pain or bleeding, pain from teeth or hoarseness   RESPIRATORY: no cough, SOB, DOE, wheezing, hemoptysis CARDIAC: no chest pain, edema or palpitations GI: no abdominal pain, diarrhea, constipation, heart burn, nausea or vomiting GU: Denies dysuria, frequency, hematuria, incontinence, or discharge PSYCHIATRIC: Denies feeling of depression or anxiety. No report of hallucinations, insomnia, paranoia, or agitation   PHYSICAL EXAMINATION  GENERAL APPEARANCE: Well nourished. In no acute distress. Normal body habitus HEAD: Normal in size and contour. No evidence of trauma EYES: Lids open and close normally. No blepharitis, + entropion on left lower eyelid. PERRL. Left Conjunctiva with erythema. Lenses are without opacity EARS: Pinnae are  normal. Patient hears normal voice tunes of the examiner MOUTH and THROAT: Lips are without lesions. Oral mucosa is moist and without lesions. Tongue is normal in shape, size, and color and without lesions NECK: supple, trachea midline, no neck masses, no thyroid tenderness, no thyromegaly LYMPHATICS: no LAN in the neck, no supraclavicular LAN RESPIRATORY: breathing is even & unlabored, BS CTAB CARDIAC: RRR, no murmur,no extra heart sounds, LLE edema 1+ GI: abdomen soft, normal BS, no masses, no tenderness, no hepatomegaly, no splenomegaly EXTREMITIES:  Left foot with splint PSYCHIATRIC: Alert and oriented X 3. Affect and behavior are appropriate  LABS/RADIOLOGY: Labs reviewed: Basic Metabolic Panel:  Recent Labs  02/02/14 0640  12/22/14 0455 12/23/14 0501 12/25/14 0508  NA 143  < > 134* 131* 132*  K 4.0  < > 4.0 3.7 4.0  CL 108  < > 101 100* 100*  CO2 21  < > 25 24 24   GLUCOSE 100*  < > 90 101* 92  BUN 34*  < > 30* 25* 25*  CREATININE 1.61*  < > 1.55* 1.22 1.32*  CALCIUM 8.4  < > 8.3* 8.1* 8.3*  MG 2.1  --   --   --   --   PHOS 4.6  --   --   --   --   < > = values in this interval not displayed. Liver Function Tests:  Recent Labs  02/02/14 0640 07/17/14 2149 09/29/14 1313  AST 8 15 12*  ALT <5 8 8*  ALKPHOS 66 61 59  BILITOT 0.3 0.5 0.4  PROT 6.8 6.9 6.8  ALBUMIN 2.7* 3.2* 3.0*    CBC:  Recent Labs  02/01/14 1938  07/17/14 2149  12/22/14 0455 12/23/14 0501 12/25/14 0508  WBC 5.5  < > 5.3  < > 5.6 6.4 5.6  NEUTROABS 2.8  --  2.8  --   --   --   --   HGB 11.1*  < > 10.5*  < > 9.4* 9.8* 10.1*  HCT 33.2*  < > 33.1*  < > 30.1* 30.2* 31.5*  MCV 80.8  < > 80.5  < > 79.4 78.9 78.4  PLT 188  < > 186  < > 192 190 211  < > = values in this interval not displayed. Cardiac Enzymes:  Recent Labs  02/02/14 0640 02/02/14 1204 07/17/14 2149  TROPONINI <0.30 <0.30 <0.03     ASSESSMENT/PLAN:  Generalized weakness - for rehabilitation  Left ankle sprain -  continue splint on left foot; continue Percocet 5/325 mg 1-2 tabs by mouth 3 times a day when necessary  UTI - completed 5 days of Rocephin; continue Macrobid 100 mg by mouth twice a day 7 days  Chronic kidney disease stage III - baseline creatinine 1.5; currently 1.32; CMP in 1 week  Dementia - continue donepezil Depakote 250 mg  1 tab by mouth daily and 2 tabs = 500 mg by mouth every afternoon   Hyperlipidemia - continue Pravachol 40 mg 1 tab by mouth daily  Hypertension - continue Norvasc 5 mg 1 tab by mouth daily and hydralazine 25 mg 1 tab by mouth Q PM  Gout - continue allopurinol 100 mg 1 tab by mouth daily  Conjunctivitis - continue erythromycin 2% ointment to 1/2 inch to lower eyelid twice a day; opthalmology consult regarding left lower eyelid entropion  Neuropathy - continue Neurontin 800 mg 1 tab by mouth 3 times a day  Urinary urgency - continue oxybutynin 5 mg 1 tab by mouth 3 times a day  Anxiety - mood is stable; continue Paxil 20 mg 1 tab by mouth daily  Protein calorie malnutrition - albumin 3.0; continue supplementation; check CBC     Goals of care:  Short-term rehabilitation    [facility], NP [facility] [phone]

## 2015-01-25 NOTE — Progress Notes (Addendum)
Patient ID: Manuel King, male   DOB: Jan 22, 1930, 81 y.o.   MRN: [id number]    DATE:  01/12/15 MRN:  [id number]  BIRTHDAY: 24-Jan-1930  Facility:  Nursing Home Location:  [place] Room Number: 341-9  LEVEL OF CARE:  SNF [phone]  Contact Information    Name Relation Home Work [place] Daughter [phone]  [phone]   [place] Daughter [phone]  [phone]      Chief Complaint  Patient presents with  . Discharge Note    Generalized weakness, left ankle sprain, chronic kidney disease stage III, dementia, hyperlipidemia, hypertension, gout, neuropathy, urinary urgency, anxiety, and protein calorie malnutrition    HISTORY OF PRESENT ILLNESS:  This is an 81 year old male who is for discharge home with Home health PT for endurance, OT for ADLs, SW for community resources and CNA for showers. DME:  Standard wheelchair 22", elevating leg rests, cushion and anti-tippers. He has been admitted to [facility] on 12/25/14 from [facility]. He has PMH of hypertension, CVA and dementia. He was having progressive weakness after recent fall sustaining a left ankle sprain. He was diagnosed with UTI and started on Rocephin in the ED.  Patient was admitted to this facility for short-term rehabilitation after the patient's recent hospitalization.  Patient has completed SNF rehabilitation and therapy has cleared the patient for discharge.   PAST MEDICAL HISTORY:  Past Medical History  Diagnosis Date  . Hypertension   . CVA (cerebral vascular accident)   . Dementia   . Prostate cancer   . Cellulitis currently    right foot  . Arthritis       CURRENT MEDICATIONS: Reviewed     Medication List       This list is accurate as of: 01/12/15 11:59 PM.  Always use your most recent med list.               allopurinol 100 MG tablet  Commonly known as:  ZYLOPRIM  Take 100 mg by mouth daily.     amLODipine 5 MG tablet    Commonly known as:  NORVASC  Take 5 mg by mouth daily.     aspirin EC 81 MG tablet  Take 81 mg by mouth daily.     divalproex 250 MG DR tablet  Commonly known as:  DEPAKOTE  Take 250-500 mg by mouth 2 (two) times daily. Take one tablet in the am and 2 tablets in the pm     donepezil 10 MG tablet  Commonly known as:  ARICEPT  Take 10 mg by mouth at bedtime.     erythromycin ophthalmic ointment  Place a 1/2 inch ribbon of ointment into the lower eyelid.     ferrous sulfate 325 (65 FE) MG EC tablet  Take 325 mg by mouth 2 (two) times daily.     gabapentin 800 MG tablet  Commonly known as:  NEURONTIN  Take 800 mg by mouth 3 (three) times daily.     hydrALAZINE 25 MG tablet  Commonly known as:  APRESOLINE  Take 1 tablet (25 mg total) by mouth every 8 (eight) hours.     oxybutynin 5 MG tablet  Commonly known as:  DITROPAN  Take 5 mg by mouth 3 (three) times daily.     oxyCODONE-acetaminophen 5-325 MG per tablet  Commonly known as:  PERCOCET/ROXICET  Take 1-2 tablets by mouth every 3 (three) hours as needed for moderate pain.  PARoxetine 20 MG tablet  Commonly known as:  PAXIL  Take 20 mg by mouth daily.     pravastatin 40 MG tablet  Commonly known as:  PRAVACHOL  Take 40 mg by mouth daily.         No Known Allergies   REVIEW OF SYSTEMS:  GENERAL: no change in appetite, no fatigue, no weight changes, no fever, chills or weakness EYES: Denies change in vision, dry eyes, eye pain, itching or discharge EARS: Denies change in hearing, ringing in ears, or earache NOSE: Denies nasal congestion or epistaxis MOUTH and THROAT: Denies oral discomfort, gingival pain or bleeding, pain from teeth or hoarseness   RESPIRATORY: no cough, SOB, DOE, wheezing, hemoptysis CARDIAC: no chest pain, edema or palpitations GI: no abdominal pain, diarrhea, constipation, heart burn, nausea or vomiting GU: Denies dysuria, frequency, hematuria, incontinence, or discharge PSYCHIATRIC:  Denies feeling of depression or anxiety. No report of hallucinations, insomnia, paranoia, or agitation   PHYSICAL EXAMINATION  GENERAL APPEARANCE: Well nourished. In no acute distress. Normal body habitus HEAD: Normal in size and contour. No evidence of trauma EYES: Lids open and close normally. No blepharitis, + entropion on left lower eyelid. PERRL. Conjunctivae are clear and sclerae are white. Lenses are without opacity EARS: Pinnae are normal. Patient hears normal voice tunes of the examiner MOUTH and THROAT: Lips are without lesions. Oral mucosa is moist and without lesions. Tongue is normal in shape, size, and color and without lesions NECK: supple, trachea midline, no neck masses, no thyroid tenderness, no thyromegaly LYMPHATICS: no LAN in the neck, no supraclavicular LAN RESPIRATORY: breathing is even & unlabored, BS CTAB CARDIAC: RRR, no murmur,no extra heart sounds, LLE edema 1+ GI: abdomen soft, normal BS, no masses, no tenderness, no hepatomegaly, no splenomegaly EXTREMITIES:  Able to move 4 extremities PSYCHIATRIC: Alert and oriented X 3. Affect and behavior are appropriate  LABS/RADIOLOGY: Labs reviewed: 12/26/14  WBC 6.1 hemoglobin 8.9 hematocrit 27.0 MCV 75.6 platelet 182 sodium 131 potassium 4.3 glucose 81 BUN 25 creatinine 1.32 calcium 7.9 Basic Metabolic Panel:  Recent Labs  02/02/14 0640  12/22/14 0455 12/23/14 0501 12/25/14 0508  NA 143  < > 134* 131* 132*  K 4.0  < > 4.0 3.7 4.0  CL 108  < > 101 100* 100*  CO2 21  < > 25 24 24   GLUCOSE 100*  < > 90 101* 92  BUN 34*  < > 30* 25* 25*  CREATININE 1.61*  < > 1.55* 1.22 1.32*  CALCIUM 8.4  < > 8.3* 8.1* 8.3*  MG 2.1  --   --   --   --   PHOS 4.6  --   --   --   --   < > = values in this interval not displayed. Liver Function Tests:  Recent Labs  02/02/14 0640 07/17/14 2149 09/29/14 1313  AST 8 15 12*  ALT <5 8 8*  ALKPHOS 66 61 59  BILITOT 0.3 0.5 0.4  PROT 6.8 6.9 6.8  ALBUMIN 2.7* 3.2* 3.0*     CBC:  Recent Labs  02/01/14 1938  07/17/14 2149  12/22/14 0455 12/23/14 0501 12/25/14 0508  WBC 5.5  < > 5.3  < > 5.6 6.4 5.6  NEUTROABS 2.8  --  2.8  --   --   --   --   HGB 11.1*  < > 10.5*  < > 9.4* 9.8* 10.1*  HCT 33.2*  < > 33.1*  < > 30.1* 30.2*  31.5*  MCV 80.8  < > 80.5  < > 79.4 78.9 78.4  PLT 188  < > 186  < > 192 190 211  < > = values in this interval not displayed. Cardiac Enzymes:  Recent Labs  02/02/14 0640 02/02/14 1204 07/17/14 2149  TROPONINI <0.30 <0.30 <0.03     ASSESSMENT/PLAN:  Generalized weakness - for home health PT, OT, SW and CNA  Left ankle sprain -  continue Percocet 5/325 mg 1-2 tabs by mouth 3 times a day when necessary  UTI - resolved  Chronic kidney disease stage III - baseline creatinine 1.5; currently 1.32  Dementia - continue donepezil 10 mg 1 tab PO Q HS and Depakote 250 mg 1 tab by mouth daily and 2 tabs = 500 mg by mouth every afternoon   Hyperlipidemia - continue Pravachol 40 mg 1 tab by mouth daily  Hypertension - continue Norvasc 5 mg 1 tab by mouth daily and hydralazine 25 mg 1 tab by mouth Q PM  Gout - continue allopurinol 100 mg 1 tab by mouth daily  Neuropathy - continue Neurontin 800 mg 1 tab by mouth 3 times a day  Urinary urgency - continue oxybutynin 5 mg 1 tab by mouth 3 times a day  Anxiety - mood is stable; continue Paxil 20 mg 1 tab by mouth daily  Protein calorie malnutrition - albumin 3.0; continue supplementation  Microcytic anemia - hemoglobin 8.9; continue ferrous sulfate 325 mg 1 tab by mouth twice a day     I have filled out patient's discharge paperwork and written prescriptions.  Patient will receive home health PT, OT, SW and CNA.   DME provided:  Standard wheelchair 22", elevating leg rests, cushion and anti-tippers  Total discharge time: Greater than 30 minutes  Discharge time involved coordination of the discharge process with social worker, nursing staff and therapy department.  Medical justification for home health services/DME verified.     [employer], NP [employer] [phone]

## 2015-01-26 DIAGNOSIS — Z7982 Long term (current) use of aspirin: Secondary | ICD-10-CM | POA: Diagnosis not present

## 2015-01-26 DIAGNOSIS — Z8673 Personal history of transient ischemic attack (TIA), and cerebral infarction without residual deficits: Secondary | ICD-10-CM | POA: Diagnosis not present

## 2015-01-26 DIAGNOSIS — S93402D Sprain of unspecified ligament of left ankle, subsequent encounter: Secondary | ICD-10-CM | POA: Diagnosis not present

## 2015-01-26 DIAGNOSIS — N183 Chronic kidney disease, stage 3 (moderate): Secondary | ICD-10-CM | POA: Diagnosis not present

## 2015-01-26 DIAGNOSIS — Z9181 History of falling: Secondary | ICD-10-CM | POA: Diagnosis not present

## 2015-01-26 DIAGNOSIS — Z87440 Personal history of urinary (tract) infections: Secondary | ICD-10-CM | POA: Diagnosis not present

## 2015-01-26 DIAGNOSIS — I129 Hypertensive chronic kidney disease with stage 1 through stage 4 chronic kidney disease, or unspecified chronic kidney disease: Secondary | ICD-10-CM | POA: Diagnosis not present

## 2015-01-26 DIAGNOSIS — F0390 Unspecified dementia without behavioral disturbance: Secondary | ICD-10-CM | POA: Diagnosis not present

## 2015-01-26 NOTE — Addendum Note (Signed)
Addended by: Durenda Age C on: 01/26/2015 12:24 AM   Modules accepted: Orders, Medications

## 2015-01-29 DIAGNOSIS — S93402D Sprain of unspecified ligament of left ankle, subsequent encounter: Secondary | ICD-10-CM | POA: Diagnosis not present

## 2015-01-29 DIAGNOSIS — I129 Hypertensive chronic kidney disease with stage 1 through stage 4 chronic kidney disease, or unspecified chronic kidney disease: Secondary | ICD-10-CM | POA: Diagnosis not present

## 2015-01-29 DIAGNOSIS — Z9181 History of falling: Secondary | ICD-10-CM | POA: Diagnosis not present

## 2015-01-29 DIAGNOSIS — Z87440 Personal history of urinary (tract) infections: Secondary | ICD-10-CM | POA: Diagnosis not present

## 2015-01-29 DIAGNOSIS — Z7982 Long term (current) use of aspirin: Secondary | ICD-10-CM | POA: Diagnosis not present

## 2015-01-29 DIAGNOSIS — N183 Chronic kidney disease, stage 3 (moderate): Secondary | ICD-10-CM | POA: Diagnosis not present

## 2015-01-29 DIAGNOSIS — Z8673 Personal history of transient ischemic attack (TIA), and cerebral infarction without residual deficits: Secondary | ICD-10-CM | POA: Diagnosis not present

## 2015-01-29 DIAGNOSIS — F0390 Unspecified dementia without behavioral disturbance: Secondary | ICD-10-CM | POA: Diagnosis not present

## 2015-01-31 DIAGNOSIS — Z8673 Personal history of transient ischemic attack (TIA), and cerebral infarction without residual deficits: Secondary | ICD-10-CM | POA: Diagnosis not present

## 2015-01-31 DIAGNOSIS — S93402D Sprain of unspecified ligament of left ankle, subsequent encounter: Secondary | ICD-10-CM | POA: Diagnosis not present

## 2015-01-31 DIAGNOSIS — F0390 Unspecified dementia without behavioral disturbance: Secondary | ICD-10-CM | POA: Diagnosis not present

## 2015-01-31 DIAGNOSIS — Z87440 Personal history of urinary (tract) infections: Secondary | ICD-10-CM | POA: Diagnosis not present

## 2015-01-31 DIAGNOSIS — N183 Chronic kidney disease, stage 3 (moderate): Secondary | ICD-10-CM | POA: Diagnosis not present

## 2015-01-31 DIAGNOSIS — Z9181 History of falling: Secondary | ICD-10-CM | POA: Diagnosis not present

## 2015-01-31 DIAGNOSIS — Z7982 Long term (current) use of aspirin: Secondary | ICD-10-CM | POA: Diagnosis not present

## 2015-01-31 DIAGNOSIS — I129 Hypertensive chronic kidney disease with stage 1 through stage 4 chronic kidney disease, or unspecified chronic kidney disease: Secondary | ICD-10-CM | POA: Diagnosis not present

## 2015-02-01 DIAGNOSIS — R3915 Urgency of urination: Secondary | ICD-10-CM | POA: Diagnosis not present

## 2015-02-01 DIAGNOSIS — R32 Unspecified urinary incontinence: Secondary | ICD-10-CM | POA: Diagnosis not present

## 2015-02-01 DIAGNOSIS — Z7982 Long term (current) use of aspirin: Secondary | ICD-10-CM | POA: Diagnosis not present

## 2015-02-01 DIAGNOSIS — N183 Chronic kidney disease, stage 3 (moderate): Secondary | ICD-10-CM | POA: Diagnosis not present

## 2015-02-01 DIAGNOSIS — I129 Hypertensive chronic kidney disease with stage 1 through stage 4 chronic kidney disease, or unspecified chronic kidney disease: Secondary | ICD-10-CM | POA: Diagnosis not present

## 2015-02-01 DIAGNOSIS — S93402D Sprain of unspecified ligament of left ankle, subsequent encounter: Secondary | ICD-10-CM | POA: Diagnosis not present

## 2015-02-01 DIAGNOSIS — N3281 Overactive bladder: Secondary | ICD-10-CM | POA: Diagnosis not present

## 2015-02-01 DIAGNOSIS — Z8673 Personal history of transient ischemic attack (TIA), and cerebral infarction without residual deficits: Secondary | ICD-10-CM | POA: Diagnosis not present

## 2015-02-01 DIAGNOSIS — Z87440 Personal history of urinary (tract) infections: Secondary | ICD-10-CM | POA: Diagnosis not present

## 2015-02-01 DIAGNOSIS — F0390 Unspecified dementia without behavioral disturbance: Secondary | ICD-10-CM | POA: Diagnosis not present

## 2015-02-01 DIAGNOSIS — Z9181 History of falling: Secondary | ICD-10-CM | POA: Diagnosis not present

## 2015-02-01 DIAGNOSIS — C61 Malignant neoplasm of prostate: Secondary | ICD-10-CM | POA: Diagnosis not present

## 2015-02-02 DIAGNOSIS — F0390 Unspecified dementia without behavioral disturbance: Secondary | ICD-10-CM | POA: Diagnosis not present

## 2015-02-02 DIAGNOSIS — Z8673 Personal history of transient ischemic attack (TIA), and cerebral infarction without residual deficits: Secondary | ICD-10-CM | POA: Diagnosis not present

## 2015-02-02 DIAGNOSIS — N183 Chronic kidney disease, stage 3 (moderate): Secondary | ICD-10-CM | POA: Diagnosis not present

## 2015-02-02 DIAGNOSIS — I129 Hypertensive chronic kidney disease with stage 1 through stage 4 chronic kidney disease, or unspecified chronic kidney disease: Secondary | ICD-10-CM | POA: Diagnosis not present

## 2015-02-02 DIAGNOSIS — Z9181 History of falling: Secondary | ICD-10-CM | POA: Diagnosis not present

## 2015-02-02 DIAGNOSIS — Z87440 Personal history of urinary (tract) infections: Secondary | ICD-10-CM | POA: Diagnosis not present

## 2015-02-02 DIAGNOSIS — S93402D Sprain of unspecified ligament of left ankle, subsequent encounter: Secondary | ICD-10-CM | POA: Diagnosis not present

## 2015-02-02 DIAGNOSIS — Z7982 Long term (current) use of aspirin: Secondary | ICD-10-CM | POA: Diagnosis not present

## 2015-02-05 DIAGNOSIS — S93402D Sprain of unspecified ligament of left ankle, subsequent encounter: Secondary | ICD-10-CM | POA: Diagnosis not present

## 2015-02-05 DIAGNOSIS — Z7982 Long term (current) use of aspirin: Secondary | ICD-10-CM | POA: Diagnosis not present

## 2015-02-05 DIAGNOSIS — F0390 Unspecified dementia without behavioral disturbance: Secondary | ICD-10-CM | POA: Diagnosis not present

## 2015-02-05 DIAGNOSIS — Z8673 Personal history of transient ischemic attack (TIA), and cerebral infarction without residual deficits: Secondary | ICD-10-CM | POA: Diagnosis not present

## 2015-02-05 DIAGNOSIS — Z9181 History of falling: Secondary | ICD-10-CM | POA: Diagnosis not present

## 2015-02-05 DIAGNOSIS — I129 Hypertensive chronic kidney disease with stage 1 through stage 4 chronic kidney disease, or unspecified chronic kidney disease: Secondary | ICD-10-CM | POA: Diagnosis not present

## 2015-02-05 DIAGNOSIS — N183 Chronic kidney disease, stage 3 (moderate): Secondary | ICD-10-CM | POA: Diagnosis not present

## 2015-02-05 DIAGNOSIS — Z87440 Personal history of urinary (tract) infections: Secondary | ICD-10-CM | POA: Diagnosis not present

## 2015-02-06 DIAGNOSIS — Z8673 Personal history of transient ischemic attack (TIA), and cerebral infarction without residual deficits: Secondary | ICD-10-CM | POA: Diagnosis not present

## 2015-02-06 DIAGNOSIS — I129 Hypertensive chronic kidney disease with stage 1 through stage 4 chronic kidney disease, or unspecified chronic kidney disease: Secondary | ICD-10-CM | POA: Diagnosis not present

## 2015-02-06 DIAGNOSIS — S93402D Sprain of unspecified ligament of left ankle, subsequent encounter: Secondary | ICD-10-CM | POA: Diagnosis not present

## 2015-02-06 DIAGNOSIS — F0390 Unspecified dementia without behavioral disturbance: Secondary | ICD-10-CM | POA: Diagnosis not present

## 2015-02-06 DIAGNOSIS — N183 Chronic kidney disease, stage 3 (moderate): Secondary | ICD-10-CM | POA: Diagnosis not present

## 2015-02-06 DIAGNOSIS — Z87440 Personal history of urinary (tract) infections: Secondary | ICD-10-CM | POA: Diagnosis not present

## 2015-02-06 DIAGNOSIS — Z9181 History of falling: Secondary | ICD-10-CM | POA: Diagnosis not present

## 2015-02-06 DIAGNOSIS — Z7982 Long term (current) use of aspirin: Secondary | ICD-10-CM | POA: Diagnosis not present

## 2015-02-07 DIAGNOSIS — Z87440 Personal history of urinary (tract) infections: Secondary | ICD-10-CM | POA: Diagnosis not present

## 2015-02-07 DIAGNOSIS — Z7982 Long term (current) use of aspirin: Secondary | ICD-10-CM | POA: Diagnosis not present

## 2015-02-07 DIAGNOSIS — S93402D Sprain of unspecified ligament of left ankle, subsequent encounter: Secondary | ICD-10-CM | POA: Diagnosis not present

## 2015-02-07 DIAGNOSIS — Z9181 History of falling: Secondary | ICD-10-CM | POA: Diagnosis not present

## 2015-02-07 DIAGNOSIS — F0390 Unspecified dementia without behavioral disturbance: Secondary | ICD-10-CM | POA: Diagnosis not present

## 2015-02-07 DIAGNOSIS — Z8673 Personal history of transient ischemic attack (TIA), and cerebral infarction without residual deficits: Secondary | ICD-10-CM | POA: Diagnosis not present

## 2015-02-07 DIAGNOSIS — I129 Hypertensive chronic kidney disease with stage 1 through stage 4 chronic kidney disease, or unspecified chronic kidney disease: Secondary | ICD-10-CM | POA: Diagnosis not present

## 2015-02-07 DIAGNOSIS — N183 Chronic kidney disease, stage 3 (moderate): Secondary | ICD-10-CM | POA: Diagnosis not present

## 2015-02-08 DIAGNOSIS — S93402D Sprain of unspecified ligament of left ankle, subsequent encounter: Secondary | ICD-10-CM | POA: Diagnosis not present

## 2015-02-08 DIAGNOSIS — Z8673 Personal history of transient ischemic attack (TIA), and cerebral infarction without residual deficits: Secondary | ICD-10-CM | POA: Diagnosis not present

## 2015-02-08 DIAGNOSIS — Z9181 History of falling: Secondary | ICD-10-CM | POA: Diagnosis not present

## 2015-02-08 DIAGNOSIS — Z87440 Personal history of urinary (tract) infections: Secondary | ICD-10-CM | POA: Diagnosis not present

## 2015-02-08 DIAGNOSIS — N183 Chronic kidney disease, stage 3 (moderate): Secondary | ICD-10-CM | POA: Diagnosis not present

## 2015-02-08 DIAGNOSIS — I129 Hypertensive chronic kidney disease with stage 1 through stage 4 chronic kidney disease, or unspecified chronic kidney disease: Secondary | ICD-10-CM | POA: Diagnosis not present

## 2015-02-08 DIAGNOSIS — F0390 Unspecified dementia without behavioral disturbance: Secondary | ICD-10-CM | POA: Diagnosis not present

## 2015-02-08 DIAGNOSIS — Z7982 Long term (current) use of aspirin: Secondary | ICD-10-CM | POA: Diagnosis not present

## 2015-02-12 DIAGNOSIS — Z87440 Personal history of urinary (tract) infections: Secondary | ICD-10-CM | POA: Diagnosis not present

## 2015-02-12 DIAGNOSIS — S93402D Sprain of unspecified ligament of left ankle, subsequent encounter: Secondary | ICD-10-CM | POA: Diagnosis not present

## 2015-02-12 DIAGNOSIS — I129 Hypertensive chronic kidney disease with stage 1 through stage 4 chronic kidney disease, or unspecified chronic kidney disease: Secondary | ICD-10-CM | POA: Diagnosis not present

## 2015-02-12 DIAGNOSIS — Z9181 History of falling: Secondary | ICD-10-CM | POA: Diagnosis not present

## 2015-02-12 DIAGNOSIS — F0390 Unspecified dementia without behavioral disturbance: Secondary | ICD-10-CM | POA: Diagnosis not present

## 2015-02-12 DIAGNOSIS — Z7982 Long term (current) use of aspirin: Secondary | ICD-10-CM | POA: Diagnosis not present

## 2015-02-12 DIAGNOSIS — N183 Chronic kidney disease, stage 3 (moderate): Secondary | ICD-10-CM | POA: Diagnosis not present

## 2015-02-12 DIAGNOSIS — Z8673 Personal history of transient ischemic attack (TIA), and cerebral infarction without residual deficits: Secondary | ICD-10-CM | POA: Diagnosis not present

## 2015-02-13 DIAGNOSIS — Z7982 Long term (current) use of aspirin: Secondary | ICD-10-CM | POA: Diagnosis not present

## 2015-02-13 DIAGNOSIS — N183 Chronic kidney disease, stage 3 (moderate): Secondary | ICD-10-CM | POA: Diagnosis not present

## 2015-02-13 DIAGNOSIS — S93402D Sprain of unspecified ligament of left ankle, subsequent encounter: Secondary | ICD-10-CM | POA: Diagnosis not present

## 2015-02-13 DIAGNOSIS — Z87440 Personal history of urinary (tract) infections: Secondary | ICD-10-CM | POA: Diagnosis not present

## 2015-02-13 DIAGNOSIS — F0390 Unspecified dementia without behavioral disturbance: Secondary | ICD-10-CM | POA: Diagnosis not present

## 2015-02-13 DIAGNOSIS — I129 Hypertensive chronic kidney disease with stage 1 through stage 4 chronic kidney disease, or unspecified chronic kidney disease: Secondary | ICD-10-CM | POA: Diagnosis not present

## 2015-02-13 DIAGNOSIS — Z9181 History of falling: Secondary | ICD-10-CM | POA: Diagnosis not present

## 2015-02-13 DIAGNOSIS — Z8673 Personal history of transient ischemic attack (TIA), and cerebral infarction without residual deficits: Secondary | ICD-10-CM | POA: Diagnosis not present

## 2015-02-14 DIAGNOSIS — N183 Chronic kidney disease, stage 3 (moderate): Secondary | ICD-10-CM | POA: Diagnosis not present

## 2015-02-14 DIAGNOSIS — Z9181 History of falling: Secondary | ICD-10-CM | POA: Diagnosis not present

## 2015-02-14 DIAGNOSIS — Z8673 Personal history of transient ischemic attack (TIA), and cerebral infarction without residual deficits: Secondary | ICD-10-CM | POA: Diagnosis not present

## 2015-02-14 DIAGNOSIS — F0390 Unspecified dementia without behavioral disturbance: Secondary | ICD-10-CM | POA: Diagnosis not present

## 2015-02-14 DIAGNOSIS — Z87440 Personal history of urinary (tract) infections: Secondary | ICD-10-CM | POA: Diagnosis not present

## 2015-02-14 DIAGNOSIS — S93402D Sprain of unspecified ligament of left ankle, subsequent encounter: Secondary | ICD-10-CM | POA: Diagnosis not present

## 2015-02-14 DIAGNOSIS — I129 Hypertensive chronic kidney disease with stage 1 through stage 4 chronic kidney disease, or unspecified chronic kidney disease: Secondary | ICD-10-CM | POA: Diagnosis not present

## 2015-02-14 DIAGNOSIS — Z7982 Long term (current) use of aspirin: Secondary | ICD-10-CM | POA: Diagnosis not present

## 2015-02-15 DIAGNOSIS — F0390 Unspecified dementia without behavioral disturbance: Secondary | ICD-10-CM | POA: Diagnosis not present

## 2015-02-15 DIAGNOSIS — S93402D Sprain of unspecified ligament of left ankle, subsequent encounter: Secondary | ICD-10-CM | POA: Diagnosis not present

## 2015-02-15 DIAGNOSIS — I129 Hypertensive chronic kidney disease with stage 1 through stage 4 chronic kidney disease, or unspecified chronic kidney disease: Secondary | ICD-10-CM | POA: Diagnosis not present

## 2015-02-15 DIAGNOSIS — Z7982 Long term (current) use of aspirin: Secondary | ICD-10-CM | POA: Diagnosis not present

## 2015-02-15 DIAGNOSIS — Z8673 Personal history of transient ischemic attack (TIA), and cerebral infarction without residual deficits: Secondary | ICD-10-CM | POA: Diagnosis not present

## 2015-02-15 DIAGNOSIS — Z87440 Personal history of urinary (tract) infections: Secondary | ICD-10-CM | POA: Diagnosis not present

## 2015-02-15 DIAGNOSIS — Z9181 History of falling: Secondary | ICD-10-CM | POA: Diagnosis not present

## 2015-02-15 DIAGNOSIS — N183 Chronic kidney disease, stage 3 (moderate): Secondary | ICD-10-CM | POA: Diagnosis not present

## 2015-02-18 DIAGNOSIS — F0280 Dementia in other diseases classified elsewhere without behavioral disturbance: Secondary | ICD-10-CM | POA: Diagnosis not present

## 2015-02-18 DIAGNOSIS — C61 Malignant neoplasm of prostate: Secondary | ICD-10-CM | POA: Diagnosis not present

## 2015-02-19 DIAGNOSIS — I129 Hypertensive chronic kidney disease with stage 1 through stage 4 chronic kidney disease, or unspecified chronic kidney disease: Secondary | ICD-10-CM | POA: Diagnosis not present

## 2015-02-19 DIAGNOSIS — F0390 Unspecified dementia without behavioral disturbance: Secondary | ICD-10-CM | POA: Diagnosis not present

## 2015-02-19 DIAGNOSIS — Z8673 Personal history of transient ischemic attack (TIA), and cerebral infarction without residual deficits: Secondary | ICD-10-CM | POA: Diagnosis not present

## 2015-02-19 DIAGNOSIS — N183 Chronic kidney disease, stage 3 (moderate): Secondary | ICD-10-CM | POA: Diagnosis not present

## 2015-02-19 DIAGNOSIS — Z87440 Personal history of urinary (tract) infections: Secondary | ICD-10-CM | POA: Diagnosis not present

## 2015-02-19 DIAGNOSIS — Z9181 History of falling: Secondary | ICD-10-CM | POA: Diagnosis not present

## 2015-02-19 DIAGNOSIS — Z7982 Long term (current) use of aspirin: Secondary | ICD-10-CM | POA: Diagnosis not present

## 2015-02-19 DIAGNOSIS — S93402D Sprain of unspecified ligament of left ankle, subsequent encounter: Secondary | ICD-10-CM | POA: Diagnosis not present

## 2015-02-20 DIAGNOSIS — I129 Hypertensive chronic kidney disease with stage 1 through stage 4 chronic kidney disease, or unspecified chronic kidney disease: Secondary | ICD-10-CM | POA: Diagnosis not present

## 2015-02-20 DIAGNOSIS — Z9181 History of falling: Secondary | ICD-10-CM | POA: Diagnosis not present

## 2015-02-20 DIAGNOSIS — Z8673 Personal history of transient ischemic attack (TIA), and cerebral infarction without residual deficits: Secondary | ICD-10-CM | POA: Diagnosis not present

## 2015-02-20 DIAGNOSIS — S93402D Sprain of unspecified ligament of left ankle, subsequent encounter: Secondary | ICD-10-CM | POA: Diagnosis not present

## 2015-02-20 DIAGNOSIS — Z87440 Personal history of urinary (tract) infections: Secondary | ICD-10-CM | POA: Diagnosis not present

## 2015-02-20 DIAGNOSIS — F0390 Unspecified dementia without behavioral disturbance: Secondary | ICD-10-CM | POA: Diagnosis not present

## 2015-02-20 DIAGNOSIS — Z7982 Long term (current) use of aspirin: Secondary | ICD-10-CM | POA: Diagnosis not present

## 2015-02-20 DIAGNOSIS — N183 Chronic kidney disease, stage 3 (moderate): Secondary | ICD-10-CM | POA: Diagnosis not present

## 2015-02-21 DIAGNOSIS — Z8673 Personal history of transient ischemic attack (TIA), and cerebral infarction without residual deficits: Secondary | ICD-10-CM | POA: Diagnosis not present

## 2015-02-21 DIAGNOSIS — N183 Chronic kidney disease, stage 3 (moderate): Secondary | ICD-10-CM | POA: Diagnosis not present

## 2015-02-21 DIAGNOSIS — Z7982 Long term (current) use of aspirin: Secondary | ICD-10-CM | POA: Diagnosis not present

## 2015-02-21 DIAGNOSIS — F0390 Unspecified dementia without behavioral disturbance: Secondary | ICD-10-CM | POA: Diagnosis not present

## 2015-02-21 DIAGNOSIS — Z87440 Personal history of urinary (tract) infections: Secondary | ICD-10-CM | POA: Diagnosis not present

## 2015-02-21 DIAGNOSIS — I129 Hypertensive chronic kidney disease with stage 1 through stage 4 chronic kidney disease, or unspecified chronic kidney disease: Secondary | ICD-10-CM | POA: Diagnosis not present

## 2015-02-21 DIAGNOSIS — S93402D Sprain of unspecified ligament of left ankle, subsequent encounter: Secondary | ICD-10-CM | POA: Diagnosis not present

## 2015-02-21 DIAGNOSIS — Z9181 History of falling: Secondary | ICD-10-CM | POA: Diagnosis not present

## 2015-02-26 DIAGNOSIS — S93402D Sprain of unspecified ligament of left ankle, subsequent encounter: Secondary | ICD-10-CM | POA: Diagnosis not present

## 2015-02-26 DIAGNOSIS — Z7982 Long term (current) use of aspirin: Secondary | ICD-10-CM | POA: Diagnosis not present

## 2015-02-26 DIAGNOSIS — F0390 Unspecified dementia without behavioral disturbance: Secondary | ICD-10-CM | POA: Diagnosis not present

## 2015-02-26 DIAGNOSIS — N183 Chronic kidney disease, stage 3 (moderate): Secondary | ICD-10-CM | POA: Diagnosis not present

## 2015-02-26 DIAGNOSIS — Z8673 Personal history of transient ischemic attack (TIA), and cerebral infarction without residual deficits: Secondary | ICD-10-CM | POA: Diagnosis not present

## 2015-02-26 DIAGNOSIS — Z87440 Personal history of urinary (tract) infections: Secondary | ICD-10-CM | POA: Diagnosis not present

## 2015-02-26 DIAGNOSIS — I129 Hypertensive chronic kidney disease with stage 1 through stage 4 chronic kidney disease, or unspecified chronic kidney disease: Secondary | ICD-10-CM | POA: Diagnosis not present

## 2015-02-26 DIAGNOSIS — Z9181 History of falling: Secondary | ICD-10-CM | POA: Diagnosis not present

## 2015-02-27 DIAGNOSIS — Z9181 History of falling: Secondary | ICD-10-CM | POA: Diagnosis not present

## 2015-02-27 DIAGNOSIS — I129 Hypertensive chronic kidney disease with stage 1 through stage 4 chronic kidney disease, or unspecified chronic kidney disease: Secondary | ICD-10-CM | POA: Diagnosis not present

## 2015-02-27 DIAGNOSIS — Z8673 Personal history of transient ischemic attack (TIA), and cerebral infarction without residual deficits: Secondary | ICD-10-CM | POA: Diagnosis not present

## 2015-02-27 DIAGNOSIS — S93402D Sprain of unspecified ligament of left ankle, subsequent encounter: Secondary | ICD-10-CM | POA: Diagnosis not present

## 2015-02-27 DIAGNOSIS — N183 Chronic kidney disease, stage 3 (moderate): Secondary | ICD-10-CM | POA: Diagnosis not present

## 2015-02-27 DIAGNOSIS — Z7982 Long term (current) use of aspirin: Secondary | ICD-10-CM | POA: Diagnosis not present

## 2015-02-27 DIAGNOSIS — Z87440 Personal history of urinary (tract) infections: Secondary | ICD-10-CM | POA: Diagnosis not present

## 2015-02-27 DIAGNOSIS — F0390 Unspecified dementia without behavioral disturbance: Secondary | ICD-10-CM | POA: Diagnosis not present

## 2015-03-01 DIAGNOSIS — N183 Chronic kidney disease, stage 3 (moderate): Secondary | ICD-10-CM | POA: Diagnosis not present

## 2015-03-01 DIAGNOSIS — Z7982 Long term (current) use of aspirin: Secondary | ICD-10-CM | POA: Diagnosis not present

## 2015-03-01 DIAGNOSIS — F0390 Unspecified dementia without behavioral disturbance: Secondary | ICD-10-CM | POA: Diagnosis not present

## 2015-03-01 DIAGNOSIS — Z8673 Personal history of transient ischemic attack (TIA), and cerebral infarction without residual deficits: Secondary | ICD-10-CM | POA: Diagnosis not present

## 2015-03-01 DIAGNOSIS — I129 Hypertensive chronic kidney disease with stage 1 through stage 4 chronic kidney disease, or unspecified chronic kidney disease: Secondary | ICD-10-CM | POA: Diagnosis not present

## 2015-03-01 DIAGNOSIS — Z9181 History of falling: Secondary | ICD-10-CM | POA: Diagnosis not present

## 2015-03-01 DIAGNOSIS — Z87440 Personal history of urinary (tract) infections: Secondary | ICD-10-CM | POA: Diagnosis not present

## 2015-03-01 DIAGNOSIS — S93402D Sprain of unspecified ligament of left ankle, subsequent encounter: Secondary | ICD-10-CM | POA: Diagnosis not present

## 2015-03-06 DIAGNOSIS — Z9181 History of falling: Secondary | ICD-10-CM | POA: Diagnosis not present

## 2015-03-06 DIAGNOSIS — I129 Hypertensive chronic kidney disease with stage 1 through stage 4 chronic kidney disease, or unspecified chronic kidney disease: Secondary | ICD-10-CM | POA: Diagnosis not present

## 2015-03-06 DIAGNOSIS — Z87440 Personal history of urinary (tract) infections: Secondary | ICD-10-CM | POA: Diagnosis not present

## 2015-03-06 DIAGNOSIS — S93402D Sprain of unspecified ligament of left ankle, subsequent encounter: Secondary | ICD-10-CM | POA: Diagnosis not present

## 2015-03-06 DIAGNOSIS — F0390 Unspecified dementia without behavioral disturbance: Secondary | ICD-10-CM | POA: Diagnosis not present

## 2015-03-06 DIAGNOSIS — Z7982 Long term (current) use of aspirin: Secondary | ICD-10-CM | POA: Diagnosis not present

## 2015-03-06 DIAGNOSIS — Z8673 Personal history of transient ischemic attack (TIA), and cerebral infarction without residual deficits: Secondary | ICD-10-CM | POA: Diagnosis not present

## 2015-03-06 DIAGNOSIS — N183 Chronic kidney disease, stage 3 (moderate): Secondary | ICD-10-CM | POA: Diagnosis not present

## 2015-03-07 DIAGNOSIS — Z7982 Long term (current) use of aspirin: Secondary | ICD-10-CM | POA: Diagnosis not present

## 2015-03-07 DIAGNOSIS — I129 Hypertensive chronic kidney disease with stage 1 through stage 4 chronic kidney disease, or unspecified chronic kidney disease: Secondary | ICD-10-CM | POA: Diagnosis not present

## 2015-03-07 DIAGNOSIS — S93402D Sprain of unspecified ligament of left ankle, subsequent encounter: Secondary | ICD-10-CM | POA: Diagnosis not present

## 2015-03-07 DIAGNOSIS — Z9181 History of falling: Secondary | ICD-10-CM | POA: Diagnosis not present

## 2015-03-07 DIAGNOSIS — F0390 Unspecified dementia without behavioral disturbance: Secondary | ICD-10-CM | POA: Diagnosis not present

## 2015-03-07 DIAGNOSIS — Z8673 Personal history of transient ischemic attack (TIA), and cerebral infarction without residual deficits: Secondary | ICD-10-CM | POA: Diagnosis not present

## 2015-03-07 DIAGNOSIS — N183 Chronic kidney disease, stage 3 (moderate): Secondary | ICD-10-CM | POA: Diagnosis not present

## 2015-03-07 DIAGNOSIS — Z87440 Personal history of urinary (tract) infections: Secondary | ICD-10-CM | POA: Diagnosis not present

## 2015-03-08 DIAGNOSIS — Z7982 Long term (current) use of aspirin: Secondary | ICD-10-CM | POA: Diagnosis not present

## 2015-03-08 DIAGNOSIS — I129 Hypertensive chronic kidney disease with stage 1 through stage 4 chronic kidney disease, or unspecified chronic kidney disease: Secondary | ICD-10-CM | POA: Diagnosis not present

## 2015-03-08 DIAGNOSIS — N183 Chronic kidney disease, stage 3 (moderate): Secondary | ICD-10-CM | POA: Diagnosis not present

## 2015-03-08 DIAGNOSIS — F0390 Unspecified dementia without behavioral disturbance: Secondary | ICD-10-CM | POA: Diagnosis not present

## 2015-03-08 DIAGNOSIS — Z87440 Personal history of urinary (tract) infections: Secondary | ICD-10-CM | POA: Diagnosis not present

## 2015-03-08 DIAGNOSIS — S93402D Sprain of unspecified ligament of left ankle, subsequent encounter: Secondary | ICD-10-CM | POA: Diagnosis not present

## 2015-03-08 DIAGNOSIS — Z9181 History of falling: Secondary | ICD-10-CM | POA: Diagnosis not present

## 2015-03-08 DIAGNOSIS — Z8673 Personal history of transient ischemic attack (TIA), and cerebral infarction without residual deficits: Secondary | ICD-10-CM | POA: Diagnosis not present

## 2015-03-21 DIAGNOSIS — F0280 Dementia in other diseases classified elsewhere without behavioral disturbance: Secondary | ICD-10-CM | POA: Diagnosis not present

## 2015-03-21 DIAGNOSIS — C61 Malignant neoplasm of prostate: Secondary | ICD-10-CM | POA: Diagnosis not present

## 2015-03-28 DIAGNOSIS — C61 Malignant neoplasm of prostate: Secondary | ICD-10-CM | POA: Diagnosis not present

## 2015-04-20 DIAGNOSIS — F0280 Dementia in other diseases classified elsewhere without behavioral disturbance: Secondary | ICD-10-CM | POA: Diagnosis not present

## 2015-04-20 DIAGNOSIS — C61 Malignant neoplasm of prostate: Secondary | ICD-10-CM | POA: Diagnosis not present

## 2015-05-21 DIAGNOSIS — C61 Malignant neoplasm of prostate: Secondary | ICD-10-CM | POA: Diagnosis not present

## 2015-06-21 DIAGNOSIS — C61 Malignant neoplasm of prostate: Secondary | ICD-10-CM | POA: Diagnosis not present

## 2015-07-04 DIAGNOSIS — N183 Chronic kidney disease, stage 3 (moderate): Secondary | ICD-10-CM | POA: Diagnosis not present

## 2015-07-04 DIAGNOSIS — I739 Peripheral vascular disease, unspecified: Secondary | ICD-10-CM | POA: Diagnosis not present

## 2015-07-04 DIAGNOSIS — D649 Anemia, unspecified: Secondary | ICD-10-CM | POA: Diagnosis not present

## 2015-07-04 DIAGNOSIS — R531 Weakness: Secondary | ICD-10-CM | POA: Diagnosis not present

## 2015-07-19 DIAGNOSIS — C61 Malignant neoplasm of prostate: Secondary | ICD-10-CM | POA: Diagnosis not present

## 2015-07-27 DIAGNOSIS — C61 Malignant neoplasm of prostate: Secondary | ICD-10-CM | POA: Diagnosis not present

## 2015-08-19 DIAGNOSIS — C61 Malignant neoplasm of prostate: Secondary | ICD-10-CM | POA: Diagnosis not present

## 2015-08-22 DIAGNOSIS — I739 Peripheral vascular disease, unspecified: Secondary | ICD-10-CM | POA: Diagnosis not present

## 2015-08-22 DIAGNOSIS — E785 Hyperlipidemia, unspecified: Secondary | ICD-10-CM | POA: Diagnosis not present

## 2015-08-22 DIAGNOSIS — I10 Essential (primary) hypertension: Secondary | ICD-10-CM | POA: Diagnosis not present

## 2015-08-22 DIAGNOSIS — G309 Alzheimer's disease, unspecified: Secondary | ICD-10-CM | POA: Diagnosis not present

## 2015-08-22 DIAGNOSIS — M19079 Primary osteoarthritis, unspecified ankle and foot: Secondary | ICD-10-CM | POA: Diagnosis not present

## 2015-08-27 DIAGNOSIS — Z79899 Other long term (current) drug therapy: Secondary | ICD-10-CM | POA: Diagnosis not present

## 2015-08-27 DIAGNOSIS — C61 Malignant neoplasm of prostate: Secondary | ICD-10-CM | POA: Diagnosis not present

## 2015-09-06 DIAGNOSIS — B351 Tinea unguium: Secondary | ICD-10-CM | POA: Diagnosis not present

## 2015-09-06 DIAGNOSIS — L853 Xerosis cutis: Secondary | ICD-10-CM | POA: Diagnosis not present

## 2015-09-06 DIAGNOSIS — I739 Peripheral vascular disease, unspecified: Secondary | ICD-10-CM | POA: Diagnosis not present

## 2015-09-18 DIAGNOSIS — C61 Malignant neoplasm of prostate: Secondary | ICD-10-CM | POA: Diagnosis not present

## 2015-09-19 DIAGNOSIS — M179 Osteoarthritis of knee, unspecified: Secondary | ICD-10-CM | POA: Diagnosis not present

## 2015-09-26 DIAGNOSIS — C61 Malignant neoplasm of prostate: Secondary | ICD-10-CM | POA: Diagnosis not present

## 2015-10-19 DIAGNOSIS — C61 Malignant neoplasm of prostate: Secondary | ICD-10-CM | POA: Diagnosis not present

## 2015-10-27 DIAGNOSIS — C61 Malignant neoplasm of prostate: Secondary | ICD-10-CM | POA: Diagnosis not present

## 2015-10-31 DIAGNOSIS — M179 Osteoarthritis of knee, unspecified: Secondary | ICD-10-CM | POA: Diagnosis not present

## 2015-10-31 DIAGNOSIS — G309 Alzheimer's disease, unspecified: Secondary | ICD-10-CM | POA: Diagnosis not present

## 2015-11-13 DIAGNOSIS — I739 Peripheral vascular disease, unspecified: Secondary | ICD-10-CM | POA: Diagnosis not present

## 2015-11-13 DIAGNOSIS — B353 Tinea pedis: Secondary | ICD-10-CM | POA: Diagnosis not present

## 2015-11-13 DIAGNOSIS — B351 Tinea unguium: Secondary | ICD-10-CM | POA: Diagnosis not present

## 2015-11-20 DIAGNOSIS — R2681 Unsteadiness on feet: Secondary | ICD-10-CM | POA: Diagnosis not present

## 2015-11-21 DIAGNOSIS — I739 Peripheral vascular disease, unspecified: Secondary | ICD-10-CM | POA: Diagnosis not present

## 2015-11-21 DIAGNOSIS — R2681 Unsteadiness on feet: Secondary | ICD-10-CM | POA: Diagnosis not present

## 2015-11-21 DIAGNOSIS — E785 Hyperlipidemia, unspecified: Secondary | ICD-10-CM | POA: Diagnosis not present

## 2015-11-21 DIAGNOSIS — I129 Hypertensive chronic kidney disease with stage 1 through stage 4 chronic kidney disease, or unspecified chronic kidney disease: Secondary | ICD-10-CM | POA: Diagnosis not present

## 2015-11-21 DIAGNOSIS — Z9181 History of falling: Secondary | ICD-10-CM | POA: Diagnosis not present

## 2015-11-21 DIAGNOSIS — M1991 Primary osteoarthritis, unspecified site: Secondary | ICD-10-CM | POA: Diagnosis not present

## 2015-11-21 DIAGNOSIS — G629 Polyneuropathy, unspecified: Secondary | ICD-10-CM | POA: Diagnosis not present

## 2015-11-21 DIAGNOSIS — N189 Chronic kidney disease, unspecified: Secondary | ICD-10-CM | POA: Diagnosis not present

## 2015-11-21 DIAGNOSIS — M6281 Muscle weakness (generalized): Secondary | ICD-10-CM | POA: Diagnosis not present

## 2015-11-22 DIAGNOSIS — Z9181 History of falling: Secondary | ICD-10-CM | POA: Diagnosis not present

## 2015-11-22 DIAGNOSIS — N189 Chronic kidney disease, unspecified: Secondary | ICD-10-CM | POA: Diagnosis not present

## 2015-11-22 DIAGNOSIS — G629 Polyneuropathy, unspecified: Secondary | ICD-10-CM | POA: Diagnosis not present

## 2015-11-22 DIAGNOSIS — E785 Hyperlipidemia, unspecified: Secondary | ICD-10-CM | POA: Diagnosis not present

## 2015-11-22 DIAGNOSIS — R2681 Unsteadiness on feet: Secondary | ICD-10-CM | POA: Diagnosis not present

## 2015-11-22 DIAGNOSIS — I739 Peripheral vascular disease, unspecified: Secondary | ICD-10-CM | POA: Diagnosis not present

## 2015-11-22 DIAGNOSIS — M6281 Muscle weakness (generalized): Secondary | ICD-10-CM | POA: Diagnosis not present

## 2015-11-22 DIAGNOSIS — M1991 Primary osteoarthritis, unspecified site: Secondary | ICD-10-CM | POA: Diagnosis not present

## 2015-11-22 DIAGNOSIS — I129 Hypertensive chronic kidney disease with stage 1 through stage 4 chronic kidney disease, or unspecified chronic kidney disease: Secondary | ICD-10-CM | POA: Diagnosis not present

## 2015-11-26 DIAGNOSIS — C61 Malignant neoplasm of prostate: Secondary | ICD-10-CM | POA: Diagnosis not present

## 2015-11-28 DIAGNOSIS — I129 Hypertensive chronic kidney disease with stage 1 through stage 4 chronic kidney disease, or unspecified chronic kidney disease: Secondary | ICD-10-CM | POA: Diagnosis not present

## 2015-11-28 DIAGNOSIS — E785 Hyperlipidemia, unspecified: Secondary | ICD-10-CM | POA: Diagnosis not present

## 2015-11-28 DIAGNOSIS — M1991 Primary osteoarthritis, unspecified site: Secondary | ICD-10-CM | POA: Diagnosis not present

## 2015-11-28 DIAGNOSIS — M6281 Muscle weakness (generalized): Secondary | ICD-10-CM | POA: Diagnosis not present

## 2015-11-28 DIAGNOSIS — Z9181 History of falling: Secondary | ICD-10-CM | POA: Diagnosis not present

## 2015-11-28 DIAGNOSIS — I739 Peripheral vascular disease, unspecified: Secondary | ICD-10-CM | POA: Diagnosis not present

## 2015-11-28 DIAGNOSIS — R2681 Unsteadiness on feet: Secondary | ICD-10-CM | POA: Diagnosis not present

## 2015-11-28 DIAGNOSIS — N189 Chronic kidney disease, unspecified: Secondary | ICD-10-CM | POA: Diagnosis not present

## 2015-11-28 DIAGNOSIS — G629 Polyneuropathy, unspecified: Secondary | ICD-10-CM | POA: Diagnosis not present

## 2015-11-30 DIAGNOSIS — Z9181 History of falling: Secondary | ICD-10-CM | POA: Diagnosis not present

## 2015-11-30 DIAGNOSIS — E785 Hyperlipidemia, unspecified: Secondary | ICD-10-CM | POA: Diagnosis not present

## 2015-11-30 DIAGNOSIS — I129 Hypertensive chronic kidney disease with stage 1 through stage 4 chronic kidney disease, or unspecified chronic kidney disease: Secondary | ICD-10-CM | POA: Diagnosis not present

## 2015-11-30 DIAGNOSIS — R2681 Unsteadiness on feet: Secondary | ICD-10-CM | POA: Diagnosis not present

## 2015-11-30 DIAGNOSIS — M1991 Primary osteoarthritis, unspecified site: Secondary | ICD-10-CM | POA: Diagnosis not present

## 2015-11-30 DIAGNOSIS — G629 Polyneuropathy, unspecified: Secondary | ICD-10-CM | POA: Diagnosis not present

## 2015-11-30 DIAGNOSIS — N189 Chronic kidney disease, unspecified: Secondary | ICD-10-CM | POA: Diagnosis not present

## 2015-11-30 DIAGNOSIS — M6281 Muscle weakness (generalized): Secondary | ICD-10-CM | POA: Diagnosis not present

## 2015-11-30 DIAGNOSIS — I739 Peripheral vascular disease, unspecified: Secondary | ICD-10-CM | POA: Diagnosis not present

## 2015-12-04 DIAGNOSIS — I129 Hypertensive chronic kidney disease with stage 1 through stage 4 chronic kidney disease, or unspecified chronic kidney disease: Secondary | ICD-10-CM | POA: Diagnosis not present

## 2015-12-04 DIAGNOSIS — E785 Hyperlipidemia, unspecified: Secondary | ICD-10-CM | POA: Diagnosis not present

## 2015-12-04 DIAGNOSIS — I739 Peripheral vascular disease, unspecified: Secondary | ICD-10-CM | POA: Diagnosis not present

## 2015-12-04 DIAGNOSIS — M6281 Muscle weakness (generalized): Secondary | ICD-10-CM | POA: Diagnosis not present

## 2015-12-04 DIAGNOSIS — Z9181 History of falling: Secondary | ICD-10-CM | POA: Diagnosis not present

## 2015-12-04 DIAGNOSIS — M1991 Primary osteoarthritis, unspecified site: Secondary | ICD-10-CM | POA: Diagnosis not present

## 2015-12-04 DIAGNOSIS — N189 Chronic kidney disease, unspecified: Secondary | ICD-10-CM | POA: Diagnosis not present

## 2015-12-04 DIAGNOSIS — R2681 Unsteadiness on feet: Secondary | ICD-10-CM | POA: Diagnosis not present

## 2015-12-04 DIAGNOSIS — G629 Polyneuropathy, unspecified: Secondary | ICD-10-CM | POA: Diagnosis not present

## 2015-12-06 DIAGNOSIS — M1991 Primary osteoarthritis, unspecified site: Secondary | ICD-10-CM | POA: Diagnosis not present

## 2015-12-06 DIAGNOSIS — I129 Hypertensive chronic kidney disease with stage 1 through stage 4 chronic kidney disease, or unspecified chronic kidney disease: Secondary | ICD-10-CM | POA: Diagnosis not present

## 2015-12-06 DIAGNOSIS — M6281 Muscle weakness (generalized): Secondary | ICD-10-CM | POA: Diagnosis not present

## 2015-12-06 DIAGNOSIS — G629 Polyneuropathy, unspecified: Secondary | ICD-10-CM | POA: Diagnosis not present

## 2015-12-06 DIAGNOSIS — R2681 Unsteadiness on feet: Secondary | ICD-10-CM | POA: Diagnosis not present

## 2015-12-06 DIAGNOSIS — Z9181 History of falling: Secondary | ICD-10-CM | POA: Diagnosis not present

## 2015-12-06 DIAGNOSIS — I739 Peripheral vascular disease, unspecified: Secondary | ICD-10-CM | POA: Diagnosis not present

## 2015-12-06 DIAGNOSIS — E785 Hyperlipidemia, unspecified: Secondary | ICD-10-CM | POA: Diagnosis not present

## 2015-12-06 DIAGNOSIS — N189 Chronic kidney disease, unspecified: Secondary | ICD-10-CM | POA: Diagnosis not present

## 2015-12-07 DIAGNOSIS — N189 Chronic kidney disease, unspecified: Secondary | ICD-10-CM | POA: Diagnosis not present

## 2015-12-07 DIAGNOSIS — M6281 Muscle weakness (generalized): Secondary | ICD-10-CM | POA: Diagnosis not present

## 2015-12-07 DIAGNOSIS — I129 Hypertensive chronic kidney disease with stage 1 through stage 4 chronic kidney disease, or unspecified chronic kidney disease: Secondary | ICD-10-CM | POA: Diagnosis not present

## 2015-12-07 DIAGNOSIS — M1991 Primary osteoarthritis, unspecified site: Secondary | ICD-10-CM | POA: Diagnosis not present

## 2015-12-07 DIAGNOSIS — G629 Polyneuropathy, unspecified: Secondary | ICD-10-CM | POA: Diagnosis not present

## 2015-12-07 DIAGNOSIS — Z9181 History of falling: Secondary | ICD-10-CM | POA: Diagnosis not present

## 2015-12-07 DIAGNOSIS — E785 Hyperlipidemia, unspecified: Secondary | ICD-10-CM | POA: Diagnosis not present

## 2015-12-07 DIAGNOSIS — R2681 Unsteadiness on feet: Secondary | ICD-10-CM | POA: Diagnosis not present

## 2015-12-07 DIAGNOSIS — I739 Peripheral vascular disease, unspecified: Secondary | ICD-10-CM | POA: Diagnosis not present

## 2015-12-10 DIAGNOSIS — R2681 Unsteadiness on feet: Secondary | ICD-10-CM | POA: Diagnosis not present

## 2015-12-10 DIAGNOSIS — E785 Hyperlipidemia, unspecified: Secondary | ICD-10-CM | POA: Diagnosis not present

## 2015-12-10 DIAGNOSIS — I739 Peripheral vascular disease, unspecified: Secondary | ICD-10-CM | POA: Diagnosis not present

## 2015-12-10 DIAGNOSIS — I129 Hypertensive chronic kidney disease with stage 1 through stage 4 chronic kidney disease, or unspecified chronic kidney disease: Secondary | ICD-10-CM | POA: Diagnosis not present

## 2015-12-10 DIAGNOSIS — N189 Chronic kidney disease, unspecified: Secondary | ICD-10-CM | POA: Diagnosis not present

## 2015-12-10 DIAGNOSIS — Z9181 History of falling: Secondary | ICD-10-CM | POA: Diagnosis not present

## 2015-12-10 DIAGNOSIS — M1991 Primary osteoarthritis, unspecified site: Secondary | ICD-10-CM | POA: Diagnosis not present

## 2015-12-10 DIAGNOSIS — M6281 Muscle weakness (generalized): Secondary | ICD-10-CM | POA: Diagnosis not present

## 2015-12-10 DIAGNOSIS — G629 Polyneuropathy, unspecified: Secondary | ICD-10-CM | POA: Diagnosis not present

## 2015-12-12 DIAGNOSIS — I129 Hypertensive chronic kidney disease with stage 1 through stage 4 chronic kidney disease, or unspecified chronic kidney disease: Secondary | ICD-10-CM | POA: Diagnosis not present

## 2015-12-12 DIAGNOSIS — M1991 Primary osteoarthritis, unspecified site: Secondary | ICD-10-CM | POA: Diagnosis not present

## 2015-12-12 DIAGNOSIS — R2681 Unsteadiness on feet: Secondary | ICD-10-CM | POA: Diagnosis not present

## 2015-12-12 DIAGNOSIS — I739 Peripheral vascular disease, unspecified: Secondary | ICD-10-CM | POA: Diagnosis not present

## 2015-12-12 DIAGNOSIS — N189 Chronic kidney disease, unspecified: Secondary | ICD-10-CM | POA: Diagnosis not present

## 2015-12-12 DIAGNOSIS — G629 Polyneuropathy, unspecified: Secondary | ICD-10-CM | POA: Diagnosis not present

## 2015-12-12 DIAGNOSIS — E785 Hyperlipidemia, unspecified: Secondary | ICD-10-CM | POA: Diagnosis not present

## 2015-12-12 DIAGNOSIS — Z9181 History of falling: Secondary | ICD-10-CM | POA: Diagnosis not present

## 2015-12-12 DIAGNOSIS — M6281 Muscle weakness (generalized): Secondary | ICD-10-CM | POA: Diagnosis not present

## 2015-12-13 DIAGNOSIS — E785 Hyperlipidemia, unspecified: Secondary | ICD-10-CM | POA: Diagnosis not present

## 2015-12-13 DIAGNOSIS — G629 Polyneuropathy, unspecified: Secondary | ICD-10-CM | POA: Diagnosis not present

## 2015-12-13 DIAGNOSIS — M6281 Muscle weakness (generalized): Secondary | ICD-10-CM | POA: Diagnosis not present

## 2015-12-13 DIAGNOSIS — R2681 Unsteadiness on feet: Secondary | ICD-10-CM | POA: Diagnosis not present

## 2015-12-13 DIAGNOSIS — M1991 Primary osteoarthritis, unspecified site: Secondary | ICD-10-CM | POA: Diagnosis not present

## 2015-12-13 DIAGNOSIS — Z9181 History of falling: Secondary | ICD-10-CM | POA: Diagnosis not present

## 2015-12-13 DIAGNOSIS — I129 Hypertensive chronic kidney disease with stage 1 through stage 4 chronic kidney disease, or unspecified chronic kidney disease: Secondary | ICD-10-CM | POA: Diagnosis not present

## 2015-12-13 DIAGNOSIS — N189 Chronic kidney disease, unspecified: Secondary | ICD-10-CM | POA: Diagnosis not present

## 2015-12-13 DIAGNOSIS — I739 Peripheral vascular disease, unspecified: Secondary | ICD-10-CM | POA: Diagnosis not present

## 2015-12-17 DIAGNOSIS — N189 Chronic kidney disease, unspecified: Secondary | ICD-10-CM | POA: Diagnosis not present

## 2015-12-17 DIAGNOSIS — I739 Peripheral vascular disease, unspecified: Secondary | ICD-10-CM | POA: Diagnosis not present

## 2015-12-17 DIAGNOSIS — G629 Polyneuropathy, unspecified: Secondary | ICD-10-CM | POA: Diagnosis not present

## 2015-12-17 DIAGNOSIS — Z9181 History of falling: Secondary | ICD-10-CM | POA: Diagnosis not present

## 2015-12-17 DIAGNOSIS — M6281 Muscle weakness (generalized): Secondary | ICD-10-CM | POA: Diagnosis not present

## 2015-12-17 DIAGNOSIS — E785 Hyperlipidemia, unspecified: Secondary | ICD-10-CM | POA: Diagnosis not present

## 2015-12-17 DIAGNOSIS — M1991 Primary osteoarthritis, unspecified site: Secondary | ICD-10-CM | POA: Diagnosis not present

## 2015-12-17 DIAGNOSIS — R2681 Unsteadiness on feet: Secondary | ICD-10-CM | POA: Diagnosis not present

## 2015-12-17 DIAGNOSIS — I129 Hypertensive chronic kidney disease with stage 1 through stage 4 chronic kidney disease, or unspecified chronic kidney disease: Secondary | ICD-10-CM | POA: Diagnosis not present

## 2015-12-21 DIAGNOSIS — M6281 Muscle weakness (generalized): Secondary | ICD-10-CM | POA: Diagnosis not present

## 2015-12-21 DIAGNOSIS — M1991 Primary osteoarthritis, unspecified site: Secondary | ICD-10-CM | POA: Diagnosis not present

## 2015-12-21 DIAGNOSIS — G629 Polyneuropathy, unspecified: Secondary | ICD-10-CM | POA: Diagnosis not present

## 2015-12-21 DIAGNOSIS — I129 Hypertensive chronic kidney disease with stage 1 through stage 4 chronic kidney disease, or unspecified chronic kidney disease: Secondary | ICD-10-CM | POA: Diagnosis not present

## 2015-12-21 DIAGNOSIS — R2681 Unsteadiness on feet: Secondary | ICD-10-CM | POA: Diagnosis not present

## 2015-12-21 DIAGNOSIS — E785 Hyperlipidemia, unspecified: Secondary | ICD-10-CM | POA: Diagnosis not present

## 2015-12-21 DIAGNOSIS — I739 Peripheral vascular disease, unspecified: Secondary | ICD-10-CM | POA: Diagnosis not present

## 2015-12-21 DIAGNOSIS — N189 Chronic kidney disease, unspecified: Secondary | ICD-10-CM | POA: Diagnosis not present

## 2015-12-21 DIAGNOSIS — Z9181 History of falling: Secondary | ICD-10-CM | POA: Diagnosis not present

## 2015-12-25 DIAGNOSIS — R2681 Unsteadiness on feet: Secondary | ICD-10-CM | POA: Diagnosis not present

## 2015-12-25 DIAGNOSIS — N189 Chronic kidney disease, unspecified: Secondary | ICD-10-CM | POA: Diagnosis not present

## 2015-12-25 DIAGNOSIS — M6281 Muscle weakness (generalized): Secondary | ICD-10-CM | POA: Diagnosis not present

## 2015-12-25 DIAGNOSIS — I129 Hypertensive chronic kidney disease with stage 1 through stage 4 chronic kidney disease, or unspecified chronic kidney disease: Secondary | ICD-10-CM | POA: Diagnosis not present

## 2015-12-25 DIAGNOSIS — M1991 Primary osteoarthritis, unspecified site: Secondary | ICD-10-CM | POA: Diagnosis not present

## 2015-12-25 DIAGNOSIS — G629 Polyneuropathy, unspecified: Secondary | ICD-10-CM | POA: Diagnosis not present

## 2015-12-25 DIAGNOSIS — Z9181 History of falling: Secondary | ICD-10-CM | POA: Diagnosis not present

## 2015-12-25 DIAGNOSIS — I739 Peripheral vascular disease, unspecified: Secondary | ICD-10-CM | POA: Diagnosis not present

## 2015-12-25 DIAGNOSIS — E785 Hyperlipidemia, unspecified: Secondary | ICD-10-CM | POA: Diagnosis not present

## 2015-12-27 DIAGNOSIS — C61 Malignant neoplasm of prostate: Secondary | ICD-10-CM | POA: Diagnosis not present

## 2015-12-31 DIAGNOSIS — Z79899 Other long term (current) drug therapy: Secondary | ICD-10-CM | POA: Diagnosis not present

## 2016-01-01 DIAGNOSIS — M6281 Muscle weakness (generalized): Secondary | ICD-10-CM | POA: Diagnosis not present

## 2016-01-01 DIAGNOSIS — Z9181 History of falling: Secondary | ICD-10-CM | POA: Diagnosis not present

## 2016-01-01 DIAGNOSIS — N189 Chronic kidney disease, unspecified: Secondary | ICD-10-CM | POA: Diagnosis not present

## 2016-01-01 DIAGNOSIS — I129 Hypertensive chronic kidney disease with stage 1 through stage 4 chronic kidney disease, or unspecified chronic kidney disease: Secondary | ICD-10-CM | POA: Diagnosis not present

## 2016-01-01 DIAGNOSIS — G629 Polyneuropathy, unspecified: Secondary | ICD-10-CM | POA: Diagnosis not present

## 2016-01-01 DIAGNOSIS — I739 Peripheral vascular disease, unspecified: Secondary | ICD-10-CM | POA: Diagnosis not present

## 2016-01-01 DIAGNOSIS — R2681 Unsteadiness on feet: Secondary | ICD-10-CM | POA: Diagnosis not present

## 2016-01-01 DIAGNOSIS — E785 Hyperlipidemia, unspecified: Secondary | ICD-10-CM | POA: Diagnosis not present

## 2016-01-01 DIAGNOSIS — M1991 Primary osteoarthritis, unspecified site: Secondary | ICD-10-CM | POA: Diagnosis not present

## 2016-01-04 DIAGNOSIS — Z9181 History of falling: Secondary | ICD-10-CM | POA: Diagnosis not present

## 2016-01-04 DIAGNOSIS — M1991 Primary osteoarthritis, unspecified site: Secondary | ICD-10-CM | POA: Diagnosis not present

## 2016-01-04 DIAGNOSIS — I739 Peripheral vascular disease, unspecified: Secondary | ICD-10-CM | POA: Diagnosis not present

## 2016-01-04 DIAGNOSIS — R2681 Unsteadiness on feet: Secondary | ICD-10-CM | POA: Diagnosis not present

## 2016-01-04 DIAGNOSIS — G629 Polyneuropathy, unspecified: Secondary | ICD-10-CM | POA: Diagnosis not present

## 2016-01-04 DIAGNOSIS — I129 Hypertensive chronic kidney disease with stage 1 through stage 4 chronic kidney disease, or unspecified chronic kidney disease: Secondary | ICD-10-CM | POA: Diagnosis not present

## 2016-01-04 DIAGNOSIS — N189 Chronic kidney disease, unspecified: Secondary | ICD-10-CM | POA: Diagnosis not present

## 2016-01-04 DIAGNOSIS — E785 Hyperlipidemia, unspecified: Secondary | ICD-10-CM | POA: Diagnosis not present

## 2016-01-04 DIAGNOSIS — M6281 Muscle weakness (generalized): Secondary | ICD-10-CM | POA: Diagnosis not present

## 2016-01-08 DIAGNOSIS — R2681 Unsteadiness on feet: Secondary | ICD-10-CM | POA: Diagnosis not present

## 2016-01-08 DIAGNOSIS — E785 Hyperlipidemia, unspecified: Secondary | ICD-10-CM | POA: Diagnosis not present

## 2016-01-08 DIAGNOSIS — I129 Hypertensive chronic kidney disease with stage 1 through stage 4 chronic kidney disease, or unspecified chronic kidney disease: Secondary | ICD-10-CM | POA: Diagnosis not present

## 2016-01-08 DIAGNOSIS — M6281 Muscle weakness (generalized): Secondary | ICD-10-CM | POA: Diagnosis not present

## 2016-01-08 DIAGNOSIS — M1991 Primary osteoarthritis, unspecified site: Secondary | ICD-10-CM | POA: Diagnosis not present

## 2016-01-08 DIAGNOSIS — G629 Polyneuropathy, unspecified: Secondary | ICD-10-CM | POA: Diagnosis not present

## 2016-01-08 DIAGNOSIS — Z9181 History of falling: Secondary | ICD-10-CM | POA: Diagnosis not present

## 2016-01-08 DIAGNOSIS — N189 Chronic kidney disease, unspecified: Secondary | ICD-10-CM | POA: Diagnosis not present

## 2016-01-08 DIAGNOSIS — I739 Peripheral vascular disease, unspecified: Secondary | ICD-10-CM | POA: Diagnosis not present

## 2016-01-10 DIAGNOSIS — M1991 Primary osteoarthritis, unspecified site: Secondary | ICD-10-CM | POA: Diagnosis not present

## 2016-01-10 DIAGNOSIS — M6281 Muscle weakness (generalized): Secondary | ICD-10-CM | POA: Diagnosis not present

## 2016-01-10 DIAGNOSIS — I129 Hypertensive chronic kidney disease with stage 1 through stage 4 chronic kidney disease, or unspecified chronic kidney disease: Secondary | ICD-10-CM | POA: Diagnosis not present

## 2016-01-10 DIAGNOSIS — G629 Polyneuropathy, unspecified: Secondary | ICD-10-CM | POA: Diagnosis not present

## 2016-01-10 DIAGNOSIS — N189 Chronic kidney disease, unspecified: Secondary | ICD-10-CM | POA: Diagnosis not present

## 2016-01-10 DIAGNOSIS — I739 Peripheral vascular disease, unspecified: Secondary | ICD-10-CM | POA: Diagnosis not present

## 2016-01-10 DIAGNOSIS — R2681 Unsteadiness on feet: Secondary | ICD-10-CM | POA: Diagnosis not present

## 2016-01-10 DIAGNOSIS — Z9181 History of falling: Secondary | ICD-10-CM | POA: Diagnosis not present

## 2016-01-10 DIAGNOSIS — E785 Hyperlipidemia, unspecified: Secondary | ICD-10-CM | POA: Diagnosis not present

## 2016-01-14 DIAGNOSIS — E785 Hyperlipidemia, unspecified: Secondary | ICD-10-CM | POA: Diagnosis not present

## 2016-01-14 DIAGNOSIS — M6281 Muscle weakness (generalized): Secondary | ICD-10-CM | POA: Diagnosis not present

## 2016-01-14 DIAGNOSIS — Z9181 History of falling: Secondary | ICD-10-CM | POA: Diagnosis not present

## 2016-01-14 DIAGNOSIS — I129 Hypertensive chronic kidney disease with stage 1 through stage 4 chronic kidney disease, or unspecified chronic kidney disease: Secondary | ICD-10-CM | POA: Diagnosis not present

## 2016-01-14 DIAGNOSIS — M1991 Primary osteoarthritis, unspecified site: Secondary | ICD-10-CM | POA: Diagnosis not present

## 2016-01-14 DIAGNOSIS — N189 Chronic kidney disease, unspecified: Secondary | ICD-10-CM | POA: Diagnosis not present

## 2016-01-14 DIAGNOSIS — G629 Polyneuropathy, unspecified: Secondary | ICD-10-CM | POA: Diagnosis not present

## 2016-01-14 DIAGNOSIS — R2681 Unsteadiness on feet: Secondary | ICD-10-CM | POA: Diagnosis not present

## 2016-01-14 DIAGNOSIS — I739 Peripheral vascular disease, unspecified: Secondary | ICD-10-CM | POA: Diagnosis not present

## 2016-01-15 DIAGNOSIS — R2681 Unsteadiness on feet: Secondary | ICD-10-CM | POA: Diagnosis not present

## 2016-01-15 DIAGNOSIS — I129 Hypertensive chronic kidney disease with stage 1 through stage 4 chronic kidney disease, or unspecified chronic kidney disease: Secondary | ICD-10-CM | POA: Diagnosis not present

## 2016-01-15 DIAGNOSIS — N189 Chronic kidney disease, unspecified: Secondary | ICD-10-CM | POA: Diagnosis not present

## 2016-01-15 DIAGNOSIS — Z9181 History of falling: Secondary | ICD-10-CM | POA: Diagnosis not present

## 2016-01-15 DIAGNOSIS — I739 Peripheral vascular disease, unspecified: Secondary | ICD-10-CM | POA: Diagnosis not present

## 2016-01-15 DIAGNOSIS — E785 Hyperlipidemia, unspecified: Secondary | ICD-10-CM | POA: Diagnosis not present

## 2016-01-15 DIAGNOSIS — M6281 Muscle weakness (generalized): Secondary | ICD-10-CM | POA: Diagnosis not present

## 2016-01-15 DIAGNOSIS — M1991 Primary osteoarthritis, unspecified site: Secondary | ICD-10-CM | POA: Diagnosis not present

## 2016-01-15 DIAGNOSIS — G629 Polyneuropathy, unspecified: Secondary | ICD-10-CM | POA: Diagnosis not present

## 2016-01-27 DIAGNOSIS — C61 Malignant neoplasm of prostate: Secondary | ICD-10-CM | POA: Diagnosis not present

## 2016-01-29 DIAGNOSIS — I739 Peripheral vascular disease, unspecified: Secondary | ICD-10-CM | POA: Diagnosis not present

## 2016-01-29 DIAGNOSIS — B351 Tinea unguium: Secondary | ICD-10-CM | POA: Diagnosis not present

## 2016-02-26 DIAGNOSIS — C61 Malignant neoplasm of prostate: Secondary | ICD-10-CM | POA: Diagnosis not present

## 2016-02-27 DIAGNOSIS — I10 Essential (primary) hypertension: Secondary | ICD-10-CM | POA: Diagnosis not present

## 2016-02-27 DIAGNOSIS — G309 Alzheimer's disease, unspecified: Secondary | ICD-10-CM | POA: Diagnosis not present

## 2016-03-28 DIAGNOSIS — C61 Malignant neoplasm of prostate: Secondary | ICD-10-CM | POA: Diagnosis not present

## 2016-04-27 DIAGNOSIS — C61 Malignant neoplasm of prostate: Secondary | ICD-10-CM | POA: Diagnosis not present

## 2016-05-12 DIAGNOSIS — Z79899 Other long term (current) drug therapy: Secondary | ICD-10-CM | POA: Diagnosis not present

## 2016-05-27 DIAGNOSIS — C61 Malignant neoplasm of prostate: Secondary | ICD-10-CM | POA: Diagnosis not present

## 2016-05-28 DIAGNOSIS — C61 Malignant neoplasm of prostate: Secondary | ICD-10-CM | POA: Diagnosis not present

## 2016-05-28 DIAGNOSIS — G309 Alzheimer's disease, unspecified: Secondary | ICD-10-CM | POA: Diagnosis not present

## 2016-05-28 DIAGNOSIS — I10 Essential (primary) hypertension: Secondary | ICD-10-CM | POA: Diagnosis not present

## 2016-06-24 DIAGNOSIS — I739 Peripheral vascular disease, unspecified: Secondary | ICD-10-CM | POA: Diagnosis not present

## 2016-06-24 DIAGNOSIS — B351 Tinea unguium: Secondary | ICD-10-CM | POA: Diagnosis not present

## 2016-06-30 DIAGNOSIS — Z79899 Other long term (current) drug therapy: Secondary | ICD-10-CM | POA: Diagnosis not present

## 2016-08-28 DIAGNOSIS — I739 Peripheral vascular disease, unspecified: Secondary | ICD-10-CM | POA: Diagnosis not present

## 2016-08-28 DIAGNOSIS — B351 Tinea unguium: Secondary | ICD-10-CM | POA: Diagnosis not present

## 2016-09-08 DIAGNOSIS — Z79899 Other long term (current) drug therapy: Secondary | ICD-10-CM | POA: Diagnosis not present

## 2016-09-22 DIAGNOSIS — E785 Hyperlipidemia, unspecified: Secondary | ICD-10-CM | POA: Diagnosis not present

## 2016-09-22 DIAGNOSIS — D696 Thrombocytopenia, unspecified: Secondary | ICD-10-CM | POA: Diagnosis not present

## 2016-09-22 DIAGNOSIS — N189 Chronic kidney disease, unspecified: Secondary | ICD-10-CM | POA: Diagnosis not present

## 2016-09-22 DIAGNOSIS — Z79899 Other long term (current) drug therapy: Secondary | ICD-10-CM | POA: Diagnosis not present

## 2016-09-22 DIAGNOSIS — D631 Anemia in chronic kidney disease: Secondary | ICD-10-CM | POA: Diagnosis not present

## 2016-10-13 DIAGNOSIS — Z79899 Other long term (current) drug therapy: Secondary | ICD-10-CM | POA: Diagnosis not present

## 2016-10-20 DIAGNOSIS — M109 Gout, unspecified: Secondary | ICD-10-CM | POA: Diagnosis not present

## 2016-10-20 DIAGNOSIS — I150 Renovascular hypertension: Secondary | ICD-10-CM | POA: Diagnosis not present

## 2016-10-20 DIAGNOSIS — D696 Thrombocytopenia, unspecified: Secondary | ICD-10-CM | POA: Diagnosis not present

## 2016-10-20 DIAGNOSIS — G309 Alzheimer's disease, unspecified: Secondary | ICD-10-CM | POA: Diagnosis not present

## 2016-10-23 DIAGNOSIS — I10 Essential (primary) hypertension: Secondary | ICD-10-CM | POA: Diagnosis not present

## 2016-10-23 DIAGNOSIS — N189 Chronic kidney disease, unspecified: Secondary | ICD-10-CM | POA: Diagnosis not present

## 2016-10-23 DIAGNOSIS — G309 Alzheimer's disease, unspecified: Secondary | ICD-10-CM | POA: Diagnosis not present

## 2016-10-23 DIAGNOSIS — E785 Hyperlipidemia, unspecified: Secondary | ICD-10-CM | POA: Diagnosis not present

## 2016-11-04 DIAGNOSIS — I739 Peripheral vascular disease, unspecified: Secondary | ICD-10-CM | POA: Diagnosis not present

## 2016-11-04 DIAGNOSIS — B351 Tinea unguium: Secondary | ICD-10-CM | POA: Diagnosis not present

## 2016-12-01 DIAGNOSIS — I150 Renovascular hypertension: Secondary | ICD-10-CM | POA: Diagnosis not present

## 2016-12-01 DIAGNOSIS — M19041 Primary osteoarthritis, right hand: Secondary | ICD-10-CM | POA: Diagnosis not present

## 2016-12-01 DIAGNOSIS — M19042 Primary osteoarthritis, left hand: Secondary | ICD-10-CM | POA: Diagnosis not present

## 2016-12-02 DIAGNOSIS — N189 Chronic kidney disease, unspecified: Secondary | ICD-10-CM | POA: Diagnosis not present

## 2016-12-02 DIAGNOSIS — M1A9XX1 Chronic gout, unspecified, with tophus (tophi): Secondary | ICD-10-CM | POA: Diagnosis not present

## 2016-12-02 DIAGNOSIS — G309 Alzheimer's disease, unspecified: Secondary | ICD-10-CM | POA: Diagnosis not present

## 2016-12-02 DIAGNOSIS — D696 Thrombocytopenia, unspecified: Secondary | ICD-10-CM | POA: Diagnosis not present

## 2016-12-22 DIAGNOSIS — I10 Essential (primary) hypertension: Secondary | ICD-10-CM | POA: Diagnosis not present

## 2016-12-22 DIAGNOSIS — H578 Other specified disorders of eye and adnexa: Secondary | ICD-10-CM | POA: Diagnosis not present

## 2016-12-22 DIAGNOSIS — R2689 Other abnormalities of gait and mobility: Secondary | ICD-10-CM | POA: Diagnosis not present

## 2016-12-29 DIAGNOSIS — Z79899 Other long term (current) drug therapy: Secondary | ICD-10-CM | POA: Diagnosis not present

## 2017-01-05 DIAGNOSIS — I10 Essential (primary) hypertension: Secondary | ICD-10-CM | POA: Diagnosis not present

## 2017-01-05 DIAGNOSIS — E784 Other hyperlipidemia: Secondary | ICD-10-CM | POA: Diagnosis not present

## 2017-01-05 DIAGNOSIS — E46 Unspecified protein-calorie malnutrition: Secondary | ICD-10-CM | POA: Diagnosis not present

## 2017-01-05 DIAGNOSIS — D638 Anemia in other chronic diseases classified elsewhere: Secondary | ICD-10-CM | POA: Diagnosis not present

## 2017-01-09 DIAGNOSIS — Z79899 Other long term (current) drug therapy: Secondary | ICD-10-CM | POA: Diagnosis not present

## 2017-01-09 DIAGNOSIS — D631 Anemia in chronic kidney disease: Secondary | ICD-10-CM | POA: Diagnosis not present

## 2017-01-09 DIAGNOSIS — D638 Anemia in other chronic diseases classified elsewhere: Secondary | ICD-10-CM | POA: Diagnosis not present

## 2017-01-19 DIAGNOSIS — I10 Essential (primary) hypertension: Secondary | ICD-10-CM | POA: Diagnosis not present

## 2017-01-19 DIAGNOSIS — E785 Hyperlipidemia, unspecified: Secondary | ICD-10-CM | POA: Diagnosis not present

## 2017-01-19 DIAGNOSIS — M109 Gout, unspecified: Secondary | ICD-10-CM | POA: Diagnosis not present

## 2017-01-19 DIAGNOSIS — D509 Iron deficiency anemia, unspecified: Secondary | ICD-10-CM | POA: Diagnosis not present

## 2017-01-20 DIAGNOSIS — I739 Peripheral vascular disease, unspecified: Secondary | ICD-10-CM | POA: Diagnosis not present

## 2017-01-20 DIAGNOSIS — B351 Tinea unguium: Secondary | ICD-10-CM | POA: Diagnosis not present

## 2017-02-02 DIAGNOSIS — I10 Essential (primary) hypertension: Secondary | ICD-10-CM | POA: Diagnosis not present

## 2017-02-02 DIAGNOSIS — K5909 Other constipation: Secondary | ICD-10-CM | POA: Diagnosis not present

## 2017-02-02 DIAGNOSIS — M1990 Unspecified osteoarthritis, unspecified site: Secondary | ICD-10-CM | POA: Diagnosis not present

## 2017-02-02 DIAGNOSIS — D508 Other iron deficiency anemias: Secondary | ICD-10-CM | POA: Diagnosis not present

## 2017-02-02 DIAGNOSIS — H1032 Unspecified acute conjunctivitis, left eye: Secondary | ICD-10-CM | POA: Diagnosis not present

## 2017-02-16 DIAGNOSIS — H1032 Unspecified acute conjunctivitis, left eye: Secondary | ICD-10-CM | POA: Diagnosis not present

## 2017-02-16 DIAGNOSIS — M109 Gout, unspecified: Secondary | ICD-10-CM | POA: Diagnosis not present

## 2017-02-16 DIAGNOSIS — Z79899 Other long term (current) drug therapy: Secondary | ICD-10-CM | POA: Diagnosis not present

## 2017-02-16 DIAGNOSIS — I10 Essential (primary) hypertension: Secondary | ICD-10-CM | POA: Diagnosis not present

## 2017-02-19 DIAGNOSIS — D638 Anemia in other chronic diseases classified elsewhere: Secondary | ICD-10-CM | POA: Diagnosis not present

## 2017-02-19 DIAGNOSIS — I10 Essential (primary) hypertension: Secondary | ICD-10-CM | POA: Diagnosis not present

## 2017-02-19 DIAGNOSIS — N189 Chronic kidney disease, unspecified: Secondary | ICD-10-CM | POA: Diagnosis not present

## 2017-02-19 DIAGNOSIS — M1A9XX1 Chronic gout, unspecified, with tophus (tophi): Secondary | ICD-10-CM | POA: Diagnosis not present

## 2017-03-16 DIAGNOSIS — Z79899 Other long term (current) drug therapy: Secondary | ICD-10-CM | POA: Diagnosis not present

## 2017-03-16 DIAGNOSIS — G308 Other Alzheimer's disease: Secondary | ICD-10-CM | POA: Diagnosis not present

## 2017-03-16 DIAGNOSIS — H109 Unspecified conjunctivitis: Secondary | ICD-10-CM | POA: Diagnosis not present

## 2017-03-16 DIAGNOSIS — M1990 Unspecified osteoarthritis, unspecified site: Secondary | ICD-10-CM | POA: Diagnosis not present

## 2017-03-16 DIAGNOSIS — K5909 Other constipation: Secondary | ICD-10-CM | POA: Diagnosis not present

## 2017-03-18 DIAGNOSIS — Z23 Encounter for immunization: Secondary | ICD-10-CM | POA: Diagnosis not present

## 2017-04-14 DIAGNOSIS — G629 Polyneuropathy, unspecified: Secondary | ICD-10-CM | POA: Diagnosis not present

## 2017-04-14 DIAGNOSIS — I150 Renovascular hypertension: Secondary | ICD-10-CM | POA: Diagnosis not present

## 2017-04-14 DIAGNOSIS — Z79899 Other long term (current) drug therapy: Secondary | ICD-10-CM | POA: Diagnosis not present

## 2017-04-14 DIAGNOSIS — L853 Xerosis cutis: Secondary | ICD-10-CM | POA: Diagnosis not present

## 2017-04-23 DIAGNOSIS — I739 Peripheral vascular disease, unspecified: Secondary | ICD-10-CM | POA: Diagnosis not present

## 2017-04-23 DIAGNOSIS — B351 Tinea unguium: Secondary | ICD-10-CM | POA: Diagnosis not present

## 2017-05-14 DIAGNOSIS — Z79899 Other long term (current) drug therapy: Secondary | ICD-10-CM | POA: Diagnosis not present

## 2017-06-30 DIAGNOSIS — I739 Peripheral vascular disease, unspecified: Secondary | ICD-10-CM | POA: Diagnosis not present

## 2017-06-30 DIAGNOSIS — B351 Tinea unguium: Secondary | ICD-10-CM | POA: Diagnosis not present

## 2017-07-20 DIAGNOSIS — I10 Essential (primary) hypertension: Secondary | ICD-10-CM | POA: Diagnosis not present

## 2017-07-20 DIAGNOSIS — N183 Chronic kidney disease, stage 3 (moderate): Secondary | ICD-10-CM | POA: Diagnosis not present

## 2017-07-20 DIAGNOSIS — E785 Hyperlipidemia, unspecified: Secondary | ICD-10-CM | POA: Diagnosis not present

## 2017-07-20 DIAGNOSIS — R609 Edema, unspecified: Secondary | ICD-10-CM | POA: Diagnosis not present

## 2017-08-10 DIAGNOSIS — H02005 Unspecified entropion of left lower eyelid: Secondary | ICD-10-CM | POA: Diagnosis not present

## 2017-08-18 ENCOUNTER — Inpatient Hospital Stay (HOSPITAL_COMMUNITY)
Admission: EM | Admit: 2017-08-18 | Discharge: 2017-08-24 | DRG: 871 | Disposition: A | Discharge status: Skilled Nursing Facility | Payer: Medicare Other | Attending: Family Medicine | Admitting: Family Medicine

## 2017-08-18 ENCOUNTER — Other Ambulatory Visit: Payer: Self-pay

## 2017-08-18 ENCOUNTER — Encounter (HOSPITAL_COMMUNITY): Payer: Self-pay | Admitting: Emergency Medicine

## 2017-08-18 ENCOUNTER — Emergency Department (HOSPITAL_COMMUNITY): Payer: Medicare Other

## 2017-08-18 DIAGNOSIS — F0390 Unspecified dementia without behavioral disturbance: Secondary | ICD-10-CM | POA: Diagnosis present

## 2017-08-18 DIAGNOSIS — Z9181 History of falling: Secondary | ICD-10-CM | POA: Diagnosis not present

## 2017-08-18 DIAGNOSIS — D509 Iron deficiency anemia, unspecified: Secondary | ICD-10-CM | POA: Diagnosis present

## 2017-08-18 DIAGNOSIS — W010XXA Fall on same level from slipping, tripping and stumbling without subsequent striking against object, initial encounter: Secondary | ICD-10-CM | POA: Diagnosis present

## 2017-08-18 DIAGNOSIS — R6520 Severe sepsis without septic shock: Secondary | ICD-10-CM | POA: Diagnosis not present

## 2017-08-18 DIAGNOSIS — Z8673 Personal history of transient ischemic attack (TIA), and cerebral infarction without residual deficits: Secondary | ICD-10-CM

## 2017-08-18 DIAGNOSIS — R509 Fever, unspecified: Secondary | ICD-10-CM | POA: Diagnosis not present

## 2017-08-18 DIAGNOSIS — R278 Other lack of coordination: Secondary | ICD-10-CM | POA: Diagnosis not present

## 2017-08-18 DIAGNOSIS — R5381 Other malaise: Secondary | ICD-10-CM | POA: Diagnosis not present

## 2017-08-18 DIAGNOSIS — R2681 Unsteadiness on feet: Secondary | ICD-10-CM | POA: Diagnosis not present

## 2017-08-18 DIAGNOSIS — R1310 Dysphagia, unspecified: Secondary | ICD-10-CM | POA: Diagnosis present

## 2017-08-18 DIAGNOSIS — E785 Hyperlipidemia, unspecified: Secondary | ICD-10-CM | POA: Diagnosis not present

## 2017-08-18 DIAGNOSIS — A419 Sepsis, unspecified organism: Principal | ICD-10-CM | POA: Diagnosis present

## 2017-08-18 DIAGNOSIS — E860 Dehydration: Secondary | ICD-10-CM | POA: Diagnosis not present

## 2017-08-18 DIAGNOSIS — N179 Acute kidney failure, unspecified: Secondary | ICD-10-CM | POA: Diagnosis present

## 2017-08-18 DIAGNOSIS — I10 Essential (primary) hypertension: Secondary | ICD-10-CM | POA: Diagnosis not present

## 2017-08-18 DIAGNOSIS — R000 Tachycardia, unspecified: Secondary | ICD-10-CM | POA: Diagnosis not present

## 2017-08-18 DIAGNOSIS — I129 Hypertensive chronic kidney disease with stage 1 through stage 4 chronic kidney disease, or unspecified chronic kidney disease: Secondary | ICD-10-CM | POA: Diagnosis not present

## 2017-08-18 DIAGNOSIS — I69344 Monoplegia of lower limb following cerebral infarction affecting left non-dominant side: Secondary | ICD-10-CM | POA: Diagnosis not present

## 2017-08-18 DIAGNOSIS — N183 Chronic kidney disease, stage 3 unspecified: Secondary | ICD-10-CM | POA: Diagnosis present

## 2017-08-18 DIAGNOSIS — S0990XA Unspecified injury of head, initial encounter: Secondary | ICD-10-CM

## 2017-08-18 DIAGNOSIS — D631 Anemia in chronic kidney disease: Secondary | ICD-10-CM | POA: Diagnosis present

## 2017-08-18 DIAGNOSIS — Z79899 Other long term (current) drug therapy: Secondary | ICD-10-CM

## 2017-08-18 DIAGNOSIS — M109 Gout, unspecified: Secondary | ICD-10-CM | POA: Diagnosis not present

## 2017-08-18 DIAGNOSIS — W19XXXA Unspecified fall, initial encounter: Secondary | ICD-10-CM | POA: Diagnosis not present

## 2017-08-18 DIAGNOSIS — R2689 Other abnormalities of gait and mobility: Secondary | ICD-10-CM | POA: Diagnosis not present

## 2017-08-18 DIAGNOSIS — R05 Cough: Secondary | ICD-10-CM | POA: Diagnosis not present

## 2017-08-18 DIAGNOSIS — Z87891 Personal history of nicotine dependence: Secondary | ICD-10-CM

## 2017-08-18 DIAGNOSIS — S199XXA Unspecified injury of neck, initial encounter: Secondary | ICD-10-CM | POA: Diagnosis not present

## 2017-08-18 DIAGNOSIS — J690 Pneumonitis due to inhalation of food and vomit: Secondary | ICD-10-CM | POA: Diagnosis present

## 2017-08-18 DIAGNOSIS — F0391 Unspecified dementia with behavioral disturbance: Secondary | ICD-10-CM | POA: Diagnosis present

## 2017-08-18 DIAGNOSIS — J189 Pneumonia, unspecified organism: Secondary | ICD-10-CM | POA: Diagnosis not present

## 2017-08-18 DIAGNOSIS — R498 Other voice and resonance disorders: Secondary | ICD-10-CM | POA: Diagnosis not present

## 2017-08-18 DIAGNOSIS — M6281 Muscle weakness (generalized): Secondary | ICD-10-CM | POA: Diagnosis not present

## 2017-08-18 DIAGNOSIS — R030 Elevated blood-pressure reading, without diagnosis of hypertension: Secondary | ICD-10-CM | POA: Diagnosis not present

## 2017-08-18 DIAGNOSIS — E872 Acidosis: Secondary | ICD-10-CM | POA: Diagnosis not present

## 2017-08-18 DIAGNOSIS — R531 Weakness: Secondary | ICD-10-CM | POA: Diagnosis present

## 2017-08-18 DIAGNOSIS — S299XXA Unspecified injury of thorax, initial encounter: Secondary | ICD-10-CM | POA: Diagnosis not present

## 2017-08-18 DIAGNOSIS — Y9301 Activity, walking, marching and hiking: Secondary | ICD-10-CM | POA: Diagnosis present

## 2017-08-18 LAB — CBC WITH DIFFERENTIAL/PLATELET
BASOS ABS: 0 10*3/uL (ref 0.0–0.1)
BASOS PCT: 0 %
EOS ABS: 0 10*3/uL (ref 0.0–0.7)
Eosinophils Relative: 0 %
HCT: 32.5 % — ABNORMAL LOW (ref 39.0–52.0)
Hemoglobin: 11.1 g/dL — ABNORMAL LOW (ref 13.0–17.0)
LYMPHS PCT: 7 %
Lymphs Abs: 1 10*3/uL (ref 0.7–4.0)
MCH: 27.8 pg (ref 26.0–34.0)
MCHC: 34.2 g/dL (ref 30.0–36.0)
MCV: 81.5 fL (ref 78.0–100.0)
Monocytes Absolute: 1.5 10*3/uL — ABNORMAL HIGH (ref 0.1–1.0)
Monocytes Relative: 11 %
NEUTROS PCT: 82 %
Neutro Abs: 11.5 10*3/uL — ABNORMAL HIGH (ref 1.7–7.7)
PLATELETS: 222 10*3/uL (ref 150–400)
RBC: 3.99 MIL/uL — AB (ref 4.22–5.81)
RDW: 16.1 % — ABNORMAL HIGH (ref 11.5–15.5)
WBC: 14 10*3/uL — AB (ref 4.0–10.5)

## 2017-08-18 LAB — COMPREHENSIVE METABOLIC PANEL
ALK PHOS: 64 U/L (ref 38–126)
ALT: 18 U/L (ref 17–63)
ANION GAP: 13 (ref 5–15)
AST: 21 U/L (ref 15–41)
Albumin: 2.8 g/dL — ABNORMAL LOW (ref 3.5–5.0)
BUN: 64 mg/dL — ABNORMAL HIGH (ref 6–20)
CO2: 13 mmol/L — AB (ref 22–32)
CREATININE: 2.7 mg/dL — AB (ref 0.61–1.24)
Calcium: 8.3 mg/dL — ABNORMAL LOW (ref 8.9–10.3)
Chloride: 107 mmol/L (ref 101–111)
GFR, EST AFRICAN AMERICAN: 23 mL/min — AB (ref >60)
GFR, EST NON AFRICAN AMERICAN: 20 mL/min — AB (ref >60)
Glucose, Bld: 125 mg/dL — ABNORMAL HIGH (ref 65–99)
Potassium: 3.9 mmol/L (ref 3.5–5.1)
SODIUM: 133 mmol/L — AB (ref 135–145)
Total Bilirubin: 1.1 mg/dL (ref 0.3–1.2)
Total Protein: 7.7 g/dL (ref 6.5–8.1)

## 2017-08-18 LAB — I-STAT TROPONIN, ED: TROPONIN I, POC: 0 ng/mL (ref 0.00–0.08)

## 2017-08-18 LAB — BRAIN NATRIURETIC PEPTIDE: B Natriuretic Peptide: 81 pg/mL (ref 0.0–100.0)

## 2017-08-18 LAB — I-STAT CG4 LACTIC ACID, ED: LACTIC ACID, VENOUS: 1.14 mmol/L (ref 0.5–1.9)

## 2017-08-18 MED ORDER — DONEPEZIL HCL 10 MG PO TABS
10.0000 mg | ORAL_TABLET | Freq: Every day | ORAL | Status: DC
  Administered 2017-08-19 – 2017-08-23 (×6): 10 mg via ORAL
  Filled 2017-08-18 (×7): qty 1

## 2017-08-18 MED ORDER — FERROUS SULFATE 325 (65 FE) MG PO TABS
325.0000 mg | ORAL_TABLET | Freq: Two times a day (BID) | ORAL | Status: DC
  Administered 2017-08-19 – 2017-08-24 (×10): 325 mg via ORAL
  Filled 2017-08-18 (×11): qty 1

## 2017-08-18 MED ORDER — DM-GUAIFENESIN ER 30-600 MG PO TB12
1.0000 | ORAL_TABLET | Freq: Two times a day (BID) | ORAL | Status: DC | PRN
  Administered 2017-08-19 – 2017-08-23 (×5): 1 via ORAL
  Filled 2017-08-18 (×6): qty 1

## 2017-08-18 MED ORDER — PAROXETINE HCL 20 MG PO TABS
20.0000 mg | ORAL_TABLET | Freq: Every day | ORAL | Status: DC
  Administered 2017-08-19 – 2017-08-24 (×6): 20 mg via ORAL
  Filled 2017-08-18 (×7): qty 1

## 2017-08-18 MED ORDER — OXYBUTYNIN CHLORIDE 5 MG PO TABS
5.0000 mg | ORAL_TABLET | Freq: Three times a day (TID) | ORAL | Status: DC
  Administered 2017-08-19 – 2017-08-24 (×16): 5 mg via ORAL
  Filled 2017-08-18 (×18): qty 1

## 2017-08-18 MED ORDER — ONDANSETRON HCL 4 MG/2ML IJ SOLN
4.0000 mg | Freq: Three times a day (TID) | INTRAMUSCULAR | Status: DC | PRN
Start: 2017-08-18 — End: 2017-08-24

## 2017-08-18 MED ORDER — ZOLPIDEM TARTRATE 5 MG PO TABS
5.0000 mg | ORAL_TABLET | Freq: Every evening | ORAL | Status: DC | PRN
  Administered 2017-08-19 – 2017-08-21 (×3): 5 mg via ORAL
  Filled 2017-08-18 (×3): qty 1

## 2017-08-18 MED ORDER — ALLOPURINOL 100 MG PO TABS
100.0000 mg | ORAL_TABLET | Freq: Every day | ORAL | Status: DC
  Administered 2017-08-19 – 2017-08-24 (×6): 100 mg via ORAL
  Filled 2017-08-18 (×7): qty 1

## 2017-08-18 MED ORDER — PRAVASTATIN SODIUM 40 MG PO TABS
40.0000 mg | ORAL_TABLET | Freq: Every day | ORAL | Status: DC
  Administered 2017-08-19 – 2017-08-23 (×6): 40 mg via ORAL
  Filled 2017-08-18 (×6): qty 1

## 2017-08-18 MED ORDER — SODIUM CHLORIDE 0.9 % IV BOLUS
500.0000 mL | Freq: Once | INTRAVENOUS | Status: AC
  Administered 2017-08-18: 500 mL via INTRAVENOUS

## 2017-08-18 MED ORDER — PIPERACILLIN-TAZOBACTAM 3.375 G IVPB 30 MIN
3.3750 g | Freq: Once | INTRAVENOUS | Status: AC
  Administered 2017-08-18: 3.375 g via INTRAVENOUS
  Filled 2017-08-18: qty 50

## 2017-08-18 MED ORDER — SODIUM CHLORIDE 0.9 % IV BOLUS
2000.0000 mL | Freq: Once | INTRAVENOUS | Status: AC
  Administered 2017-08-19: 2000 mL via INTRAVENOUS

## 2017-08-18 MED ORDER — ACETAMINOPHEN 500 MG PO TABS
1000.0000 mg | ORAL_TABLET | Freq: Once | ORAL | Status: AC
  Administered 2017-08-18: 1000 mg via ORAL
  Filled 2017-08-18: qty 2

## 2017-08-18 MED ORDER — DIVALPROEX SODIUM 250 MG PO DR TAB: 250.0000 mg | DELAYED_RELEASE_TABLET | Freq: Two times a day (BID) | ORAL | Status: DC

## 2017-08-18 MED ORDER — ACETAMINOPHEN 325 MG PO TABS
650.0000 mg | ORAL_TABLET | Freq: Four times a day (QID) | ORAL | Status: DC | PRN
Start: 2017-08-18 — End: 2017-08-24
  Administered 2017-08-19 – 2017-08-24 (×5): 650 mg via ORAL
  Filled 2017-08-18 (×5): qty 2

## 2017-08-18 MED ORDER — ENOXAPARIN SODIUM 30 MG/0.3ML ~~LOC~~ SOLN
30.0000 mg | Freq: Every day | SUBCUTANEOUS | Status: DC
  Administered 2017-08-19 – 2017-08-22 (×4): 30 mg via SUBCUTANEOUS
  Filled 2017-08-18 (×4): qty 0.3

## 2017-08-18 MED ORDER — HYDRALAZINE HCL 20 MG/ML IJ SOLN: 5.0000 mg | INTRAMUSCULAR | Status: DC | PRN

## 2017-08-18 MED ORDER — POLYVINYL ALCOHOL 1.4 % OP SOLN: 1.0000 [drp] | OPHTHALMIC | Status: DC | PRN

## 2017-08-18 MED ORDER — POLYETHYLENE GLYCOL 3350 17 G PO PACK: 17.0000 g | PACK | Freq: Every day | ORAL | Status: DC | PRN

## 2017-08-18 MED ORDER — ASPIRIN EC 81 MG PO TBEC
81.0000 mg | DELAYED_RELEASE_TABLET | Freq: Every day | ORAL | Status: DC
  Administered 2017-08-19 – 2017-08-24 (×6): 81 mg via ORAL
  Filled 2017-08-18 (×6): qty 1

## 2017-08-18 MED ORDER — TRAMADOL HCL 50 MG PO TABS
50.0000 mg | ORAL_TABLET | Freq: Every day | ORAL | Status: DC
  Administered 2017-08-19 – 2017-08-24 (×6): 50 mg via ORAL
  Filled 2017-08-18 (×7): qty 1

## 2017-08-18 MED ORDER — AMLODIPINE BESYLATE 5 MG PO TABS
5.0000 mg | ORAL_TABLET | Freq: Every day | ORAL | Status: DC
  Administered 2017-08-19 – 2017-08-24 (×6): 5 mg via ORAL
  Filled 2017-08-18 (×6): qty 1

## 2017-08-18 MED ORDER — SODIUM CHLORIDE 0.9 % IV SOLN
INTRAVENOUS | Status: DC
  Administered 2017-08-19 – 2017-08-20 (×2): via INTRAVENOUS

## 2017-08-18 MED ORDER — ARTIFICIAL TEARS OPHTHALMIC OINT
1.0000 "application " | TOPICAL_OINTMENT | Freq: Every day | OPHTHALMIC | Status: DC
  Administered 2017-08-19 – 2017-08-23 (×6): 1 via OPHTHALMIC
  Filled 2017-08-18 (×2): qty 3.5

## 2017-08-18 MED ORDER — DOCUSATE SODIUM 100 MG PO CAPS
100.0000 mg | ORAL_CAPSULE | Freq: Every day | ORAL | Status: DC
  Administered 2017-08-19 – 2017-08-24 (×6): 100 mg via ORAL
  Filled 2017-08-18 (×7): qty 1

## 2017-08-18 MED ORDER — VANCOMYCIN HCL IN DEXTROSE 1-5 GM/200ML-% IV SOLN
1000.0000 mg | Freq: Once | INTRAVENOUS | Status: AC
  Administered 2017-08-18: 1000 mg via INTRAVENOUS
  Filled 2017-08-18: qty 200

## 2017-08-18 NOTE — Care Management (Signed)
Patient is a resident at [facility] and will return once medically cleared.

## 2017-08-18 NOTE — ED Triage Notes (Signed)
Per EMS, pt coming from [facility] where staff reported the pt fell while using his walker. Pt denies any LOC and denies hitting their head. Pt denies blood thinners and denies pain.

## 2017-08-18 NOTE — ED Notes (Signed)
ED Provider at bedside. 

## 2017-08-18 NOTE — ED Provider Notes (Signed)
[facility] DEPART[facility] Provider Note   CSN: [id number] Arrival date & time: 08/18/17  2005     History   Chief Complaint Chief Complaint  Patient presents with  . Fall    HPI Manuel King is a 84 y.o. male.  84 year old male with prior history of dementia, CVA, hypertension presents for evaluation following fall.  Patient reports that he felt weak and lost his balance and fell.  Patient reports he struck his head although he denies LOC.  Patient denies neck pain.  Patient was not aware that he had a fever.  Patient denies chest pain.  Patient does report a mild cough.  He denies shortness of breath.  He denies abdominal pain.  The history is provided by the patient, medical records and a relative.  Fall  This is a new problem. The current episode started 1 to 2 hours ago. The problem occurs rarely. The problem has not changed since onset.Pertinent negatives include no chest pain, no abdominal pain, no headaches and no shortness of breath. Nothing aggravates the symptoms. Nothing relieves the symptoms. He has tried nothing for the symptoms.  Fever   This is a new problem. The current episode started 1 to 2 hours ago. The problem occurs rarely. The problem has not changed since onset.The maximum temperature noted was 103 to 104 F. Associated symptoms include cough. Pertinent negatives include no chest pain and no headaches. He has tried nothing for the symptoms.    Past Medical History:  Diagnosis Date  . Arthritis   . Cellulitis currently   right foot  . CVA (cerebral vascular accident) ([place])   . Dementia   . Hypertension   . Prostate cancer [facility])     Patient Active Problem List   Diagnosis Date Noted  . Urinary urgency 01/25/2015  . Ambulatory dysfunction 12/21/2014  . Generalized weakness 07/18/2014  . Fall 07/18/2014  . Blepharitis 07/18/2014  . Weakness   . Malnutrition of moderate degree ([place]) 02/03/2014  . UTI (lower urinary tract  infection) 02/01/2014  . Bradycardia 02/01/2014  . Acute renal failure ([place]) 02/01/2014  . Constipation 02/01/2014  . Acute gout 09/16/2011  . Community acquired pneumonia 09/16/2011  . AKI (acute kidney injury) ([place]) 09/12/2011  . Falls 09/12/2011  . Acute parotitis 05/10/2011  . History of CVA (cerebrovascular accident) 05/10/2011  . Vascular dementia 05/10/2011  . SLEEP APNEA 11/16/2008  . Hyperlipidemia 09/29/2008  . BRADYCARDIA 09/29/2008  . FATIGUE, CHRONIC 09/29/2008  . Essential hypertension 09/28/2008  . PROSTATE CANCER, HX OF 09/28/2008    Past Surgical History:  Procedure Laterality Date  . BACK SURGERY    . NECK SURGERY    . prostate seed implants    . spider bite surgery     on left leg        Home Medications    Prior to Admission medications   Medication Sig Start Date End Date Taking? Authorizing Provider  acetaminophen (TYLENOL) 500 MG tablet Take 1,000 mg by mouth every 6 (six) hours as needed.    Yes [provider]  acetaminophen (TYLENOL) 500 MG tablet Take 1,000 mg by mouth 2 (two) times daily.   Yes [provider]  allopurinol (ZYLOPRIM) 100 MG tablet Take 100 mg by mouth daily.   Yes [provider]  amLODipine (NORVASC) 5 MG tablet Take 5 mg by mouth daily.   Yes [provider]  carboxymethylcellul-glycerin (OPTIVE) 0.5-0.9 % ophthalmic solution Place 1 drop into both  eyes as needed for dry eyes.   Yes [provider]  divalproex (DEPAKOTE) 250 MG DR tablet Take 250-500 mg by mouth 2 (two) times daily. Take 250 mg  tablet in the am and 500 mg  tablets in the pm   Yes [provider]  docusate sodium (COLACE) 100 MG capsule Take 100 mg by mouth daily.   Yes [provider]  donepezil (ARICEPT) 10 MG tablet Take 10 mg by mouth at bedtime.   Yes [provider]  ferrous sulfate 325 (65 FE) MG EC tablet Take 325 mg by mouth 2 (two) times daily.   Yes [provider]    hydrochlorothiazide (HYDRODIURIL) 12.5 MG tablet Take 12.5 mg by mouth daily. 07/24/17  Yes [provider]  irbesartan (AVAPRO) 150 MG tablet Take 150 mg by mouth daily. 07/24/17  Yes [provider]  oxybutynin (DITROPAN) 5 MG tablet Take 5 mg by mouth 3 (three) times daily.   Yes [provider]  PARoxetine (PAXIL) 20 MG tablet Take 20 mg by mouth daily.   Yes [provider]  pravastatin (PRAVACHOL) 40 MG tablet Take 40 mg by mouth at bedtime.    Yes [provider]  traMADol (ULTRAM) 50 MG tablet Take 50 mg by mouth daily. 07/20/17  Yes [provider]  White Petrolatum-Mineral Oil (SYSTANE NIGHTTIME) OINT Place 1 application into both eyes at bedtime.   Yes [provider]  erythromycin ophthalmic ointment Place a 1/2 inch ribbon of ointment into the lower eyelid. Patient not taking: Reported on 12/21/2014 09/29/14   Veryl Speak, MD  hydrALAZINE (APRESOLINE) 25 MG tablet Take 1 tablet (25 mg total) by mouth every 8 (eight) hours. Patient not taking: Reported on 08/18/2017 12/25/14   Theodis Blaze, MD    Family History Family History  Problem Relation Age of Onset  . Hypotension Mother   . Diabetes type II Mother   . CAD Mother   . Hypotension Father   . CAD Father   . CAD Sister     Social History Social History   Tobacco Use  . Smoking status: Former Smoker    Years: 15.00  . Smokeless tobacco: Never Used  Substance Use Topics  . Alcohol use: No    Alcohol/week: 0.6 oz    Types: 1 Glasses of wine per week    Comment: none in 2 years or more  . Drug use: No     Allergies   Patient has no known allergies.   Review of Systems Review of Systems  Constitutional: Positive for fever.  Respiratory: Positive for cough. Negative for shortness of breath.   Cardiovascular: Negative for chest pain.  Gastrointestinal: Negative for abdominal pain.  Neurological: Negative for headaches.  All other systems reviewed  and are negative.    Physical Exam Updated Vital Signs BP (!) 142/67 (BP Location: Right Arm)   Pulse (!) 103   Temp (S) (!) 103.2 F (39.6 C) (Oral)   Resp 18   SpO2 96%   Physical Exam  Constitutional: He is oriented to person, place, and time. He appears well-developed and well-nourished. No distress.  HENT:  Head: Normocephalic and atraumatic.  Mouth/Throat: Oropharynx is clear and moist.  Eyes: Pupils are equal, round, and reactive to light. Conjunctivae and EOM are normal.  Neck: Normal range of motion. Neck supple.  Cardiovascular: Normal rate, regular rhythm and normal heart sounds.  Pulmonary/Chest: Effort normal and breath sounds normal. No respiratory distress.  Abdominal:  Soft. He exhibits no distension. There is no tenderness.  Musculoskeletal: Normal range of motion. He exhibits no edema or deformity.  Neurological: He is alert and oriented to person, place, and time.  Skin: Skin is warm and dry.  Psychiatric: He has a normal mood and affect.  Nursing note and vitals reviewed.    ED Treatments / Results  Labs (all labs ordered are listed, but only abnormal results are displayed) Labs Reviewed  COMPREHENSIVE METABOLIC PANEL - Abnormal; Notable for the following components:      Result Value   Sodium 133 (*)    CO2 13 (*)    Glucose, Bld 125 (*)    BUN 64 (*)    Creatinine, Ser 2.70 (*)    Calcium 8.3 (*)    Albumin 2.8 (*)    GFR calc non Af Amer 20 (*)    GFR calc Af Amer 23 (*)    All other components within normal limits  CBC WITH DIFFERENTIAL/PLATELET - Abnormal; Notable for the following components:   WBC 14.0 (*)    RBC 3.99 (*)    Hemoglobin 11.1 (*)    HCT 32.5 (*)    RDW 16.1 (*)    All other components within normal limits  CULTURE, BLOOD (ROUTINE X 2)  CULTURE, BLOOD (ROUTINE X 2)  URINALYSIS, ROUTINE W REFLEX MICROSCOPIC  BRAIN NATRIURETIC PEPTIDE  INFLUENZA PANEL BY PCR (TYPE A & B)  I-STAT CG4 LACTIC ACID, ED  I-STAT TROPONIN, ED    I-STAT CG4 LACTIC ACID, ED    EKG EKG Interpretation  Date/Time:  Tuesday August 18 2017 20:43:29 EDT Ventricular Rate:  102 PR Interval:  190 QRS Duration: 78 QT Interval:  346 QTC Calculation: 450 R Axis:   24 Text Interpretation:  Sinus tachycardia T wave abnormality, consider inferior ischemia Abnormal ECG Confirmed by Dene Gentry [phone]) on 08/18/2017 8:52:40 PM   Radiology Dg Chest 2 View  Result Date: 08/18/2017 CLINICAL DATA:  Status post fall, with concern for chest injury. Fever. Initial encounter. EXAM: CHEST - 2 VIEW COMPARISON:  Chest radiograph performed 02/02/2014 FINDINGS: The lungs are well-aerated and clear. There is no evidence of focal opacification, pleural effusion or pneumothorax. The heart is normal in size; the mediastinal contour is within normal limits. No acute osseous abnormalities are seen. IMPRESSION: No acute cardiopulmonary process seen. No displaced rib fracture seen. Electronically Signed   By: Garald Balding M.D.   On: 08/18/2017 21:58   Ct Head Wo Contrast  Result Date: 08/18/2017 CLINICAL DATA:  Fall EXAM: CT HEAD WITHOUT CONTRAST CT CERVICAL SPINE WITHOUT CONTRAST TECHNIQUE: Multidetector CT imaging of the head and cervical spine was performed following the standard protocol without intravenous contrast. Multiplanar CT image reconstructions of the cervical spine were also generated. COMPARISON:  Head CT 09/29/2014, CT neck 05/10/2011 FINDINGS: CT HEAD FINDINGS Brain: No acute territorial infarction, hemorrhage or intracranial mass is seen. Moderate atrophy and small vessel ischemic changes of the white matter. Old lacunar infarcts in the right basal ganglia. Stable ventricle size Vascular: No hyperdense vessels.  Carotid vascular calcification Skull: Normal. Negative for fracture or focal lesion. Sinuses/Orbits: Mild mucosal thickening in the ethmoid sinuses. No acute orbital abnormality. Other: None CT CERVICAL SPINE FINDINGS Alignment: Reversal of  cervical lordosis. Facet alignment within normal limits. Skull base and vertebrae: No acute fracture. No primary bone lesion or focal pathologic process. Soft tissues and spinal canal: No prevertebral fluid or swelling. No visible canal hematoma. Disc levels: Anterior plate and  screw fixation at C3-C4 with bone fusion present. Marked degenerative changes at C4-C5, C5-C6, C6-C7 and C7-T1 with moderate degenerative changes at C2-C3. Multiple level bilateral foraminal stenosis. Upper chest: Scarring and emphysema at the apices. Other: None IMPRESSION: 1. No CT evidence for acute intracranial abnormality. Atrophy and small vessel ischemic changes of the white matter. 2. Reversal of cervical lordosis with postsurgical changes at C3-C4 and multiple level moderate severe degenerative changes. No definite acute osseous abnormality. Electronically Signed   By: Donavan Foil M.D.   On: 08/18/2017 22:29   Ct Cervical Spine Wo Contrast  Result Date: 08/18/2017 CLINICAL DATA:  Fall EXAM: CT HEAD WITHOUT CONTRAST CT CERVICAL SPINE WITHOUT CONTRAST TECHNIQUE: Multidetector CT imaging of the head and cervical spine was performed following the standard protocol without intravenous contrast. Multiplanar CT image reconstructions of the cervical spine were also generated. COMPARISON:  Head CT 09/29/2014, CT neck 05/10/2011 FINDINGS: CT HEAD FINDINGS Brain: No acute territorial infarction, hemorrhage or intracranial mass is seen. Moderate atrophy and small vessel ischemic changes of the white matter. Old lacunar infarcts in the right basal ganglia. Stable ventricle size Vascular: No hyperdense vessels.  Carotid vascular calcification Skull: Normal. Negative for fracture or focal lesion. Sinuses/Orbits: Mild mucosal thickening in the ethmoid sinuses. No acute orbital abnormality. Other: None CT CERVICAL SPINE FINDINGS Alignment: Reversal of cervical lordosis. Facet alignment within normal limits. Skull base and vertebrae: No acute  fracture. No primary bone lesion or focal pathologic process. Soft tissues and spinal canal: No prevertebral fluid or swelling. No visible canal hematoma. Disc levels: Anterior plate and screw fixation at C3-C4 with bone fusion present. Marked degenerative changes at C4-C5, C5-C6, C6-C7 and C7-T1 with moderate degenerative changes at C2-C3. Multiple level bilateral foraminal stenosis. Upper chest: Scarring and emphysema at the apices. Other: None IMPRESSION: 1. No CT evidence for acute intracranial abnormality. Atrophy and small vessel ischemic changes of the white matter. 2. Reversal of cervical lordosis with postsurgical changes at C3-C4 and multiple level moderate severe degenerative changes. No definite acute osseous abnormality. Electronically Signed   By: Donavan Foil M.D.   On: 08/18/2017 22:29    Procedures Procedures (including critical care time)  Medications Ordered in ED Medications  acetaminophen (TYLENOL) tablet 1,000 mg (1,000 mg Oral Given 08/18/17 2119)  sodium chloride 0.9 % bolus 500 mL (0 mLs Intravenous Stopped 08/18/17 2225)  piperacillin-tazobactam (ZOSYN) IVPB 3.375 g (0 g Intravenous Stopped 08/18/17 2151)  vancomycin (VANCOCIN) IVPB 1000 mg/200 mL premix (0 mg Intravenous Stopped 08/18/17 2225)     Initial Impression / Assessment and Plan / ED Course  I have reviewed the triage vital signs and the nursing notes.  Pertinent labs & imaging results that were available during my care of the patient were reviewed by me and considered in my medical decision making (see chart for details).     MDM  Screen complete  Patient is presenting with generalized weakness and a fall.  Patient does not appear to have suffered significant traumatic injuries from the fall.  During his workup it was noted that he had a fever of 103.  Cultures obtained.  Empiric antibiotics given.  CT Head and Cspine are negative. Patient will require further workup and admission.  Final Clinical  Impressions(s) / ED Diagnoses   Final diagnoses:  Fever, unspecified fever cause  Fall, initial encounter    ED Discharge Orders    None       Valarie Merino, MD 08/18/17 2337

## 2017-08-18 NOTE — H&P (Signed)
History and Physical    Manuel King GUR:[id number] DOB: Sep 27, 1929 DOA: 08/18/2017  Referring MD/NP/PA:   PCP: Merrilee Seashore, MD   Patient coming from:  The patient is coming from ALF  At baseline, pt is dependent for most of ADL.   Chief Complaint: fall, fever, generalized weakness  HPI: Manuel King is a 84 y.o. male with medical history significant of dementia, hypertension, hyperlipidemia, prostate cancer, iron deficiency anemia, stroke with right leg weakness, iron deficiency anemia, gout, depression, CKD-III, who presents with fall,fever, generalized weakness.  Per family, patient has gneralized weakness in the past for several days. Pt fell while he was walking at facility using his walker at about 8:00 PM. No LOC per family. He hit his head. Per his daughter, pt has chronic left leg weakness from previous stroke, but he seems to have new weakness in left arm. On my examination, patient muscle strength is normal in both arms. Patient has mild dry cough. No active nausea vomiting or diarrhea noted. His mental status is at the baseline per family. Patient denies any pain anywhere.no sure if patient has a symptoms of UTI. Pt has left eyelid inward turning problem.  ED Course: pt was found to have WBC 14.0, lactic acid 1.014, negative troponin, worsening renal function,pending urinalysis, temperature 103.2, tachycardia, no tachypnea, O2 sat are 96% on room air, negative chest x-ray. CT-head is negative for acute intracranial abnormalities. CT of C-spine showed degenerative disc disease. Patient is admitted to telemetry bed as inpatient.  Review of Systems: could not be reviewed accurately due to dementia.  Allergy: No Known Allergies  Past Medical History:  Diagnosis Date  . Arthritis   . Cellulitis currently   right foot  . CVA (cerebral vascular accident) ([place])   . Dementia   . Hypertension   . Prostate cancer [facility])     Past Surgical History:  Procedure Laterality  Date  . BACK SURGERY    . NECK SURGERY    . prostate seed implants    . spider bite surgery     on left leg    Social History:  reports that he has quit smoking. He quit after 15.00 years of use. He has never used smokeless tobacco. He reports that he does not drink alcohol or use drugs.  Family History:  Family History  Problem Relation Age of Onset  . Hypotension Mother   . Diabetes type II Mother   . CAD Mother   . Hypotension Father   . CAD Father   . CAD Sister      Prior to Admission medications   Medication Sig Start Date End Date Taking? Authorizing Provider  acetaminophen (TYLENOL) 500 MG tablet Take 1,000 mg by mouth every 6 (six) hours as needed.    Yes [provider]  acetaminophen (TYLENOL) 500 MG tablet Take 1,000 mg by mouth 2 (two) times daily.   Yes [provider]  allopurinol (ZYLOPRIM) 100 MG tablet Take 100 mg by mouth daily.   Yes [provider]  amLODipine (NORVASC) 5 MG tablet Take 5 mg by mouth daily.   Yes [provider]  carboxymethylcellul-glycerin (OPTIVE) 0.5-0.9 % ophthalmic solution Place 1 drop into both eyes as needed for dry eyes.   Yes [provider]  divalproex (DEPAKOTE) 250 MG DR tablet Take 250-500 mg by mouth 2 (two) times daily. Take 250 mg  tablet in the am and 500 mg  tablets in the pm   Yes [provider]  docusate sodium (COLACE) 100 MG capsule Take 100 mg by mouth daily.   Yes [provider]  donepezil (ARICEPT) 10 MG tablet Take 10 mg by mouth at bedtime.   Yes [provider]  ferrous sulfate 325 (65 FE) MG EC tablet Take 325 mg by mouth 2 (two) times daily.   Yes [provider]  hydrochlorothiazide (HYDRODIURIL) 12.5 MG tablet Take 12.5 mg by mouth daily. 07/24/17  Yes [provider]  irbesartan (AVAPRO) 150 MG tablet Take 150 mg by mouth daily. 07/24/17  Yes [provider]  oxybutynin (DITROPAN) 5 MG tablet Take 5 mg by mouth 3  (three) times daily.   Yes [provider]  PARoxetine (PAXIL) 20 MG tablet Take 20 mg by mouth daily.   Yes [provider]  pravastatin (PRAVACHOL) 40 MG tablet Take 40 mg by mouth at bedtime.    Yes [provider]  traMADol (ULTRAM) 50 MG tablet Take 50 mg by mouth daily. 07/20/17  Yes [provider]  White Petrolatum-Mineral Oil (SYSTANE NIGHTTIME) OINT Place 1 application into both eyes at bedtime.   Yes [provider]  erythromycin ophthalmic ointment Place a 1/2 inch ribbon of ointment into the lower eyelid. Patient not taking: Reported on 12/21/2014 09/29/14   Veryl Speak, MD  hydrALAZINE (APRESOLINE) 25 MG tablet Take 1 tablet (25 mg total) by mouth every 8 (eight) hours. Patient not taking: Reported on 08/18/2017 12/25/14   Theodis Blaze, MD    Physical Exam: Vitals:   08/19/17 0200 08/19/17 0230 08/19/17 0245 08/19/17 0330  BP: (!) 103/44 (!) 141/54 (!) 168/78 134/73  Pulse: 64 71 100 96  Resp: (!) 24 (!) 21 (!) 32 (!) 28  Temp:      TempSrc:      SpO2: 96% 100% 91% 95%   General: Not in acute distress HEENT:       Eyes: PERRL, EOMI, no scleral icterus.       ENT: No discharge from the ears and nose, no pharynx injection, no tonsillar enlargement.        Neck: No JVD, no bruit, no mass felt. Heme: No neck lymph node enlargement. Cardiac: S1/S2, RRR, No murmurs, No gallops or rubs. Respiratory: No rales, wheezing, rhonchi or rubs. GI: Soft, nondistended, nontender, organomegaly, BS present. GU: No hematuria Ext: No pitting leg edema bilaterally. 2+DP/PT pulse bilaterally. Musculoskeletal: No joint deformities, No joint redness or warmth, no limitation of ROM in spin. Skin: No rashes.  Neuro: Alert, knows his own name, not oriented X3, cranial nerves II-XII grossly intact, has mild weakness in left leg. Psych: Patient is not psychotic.  Labs on Admission: I have personally reviewed following labs and imaging  studies  CBC: Recent Labs  Lab 08/18/17 2117  WBC 14.0*  NEUTROABS 11.5*  HGB 11.1*  HCT 32.5*  MCV 81.5  PLT 025   Basic Metabolic Panel: Recent Labs  Lab 08/18/17 2117  NA 133*  K 3.9  CL 107  CO2 13*  GLUCOSE 125*  BUN 64*  CREATININE 2.70*  CALCIUM 8.3*   GFR: CrCl cannot be calculated (Unknown ideal weight.). Liver Function Tests: Recent Labs  Lab 08/18/17 2117  AST 21  ALT 18  ALKPHOS 64  BILITOT 1.1  PROT 7.7  ALBUMIN 2.8*   No results for input(s): LIPASE, AMYLASE in the last 168 hours. No results for input(s): AMMONIA in the last 168 hours. Coagulation Profile: No results for input(s): INR, PROTIME in the  last 168 hours. Cardiac Enzymes: No results for input(s): CKTOTAL, CKMB, CKMBINDEX, TROPONINI in the last 168 hours. BNP (last 3 results) No results for input(s): PROBNP in the last 8760 hours. HbA1C: No results for input(s): HGBA1C in the last 72 hours. CBG: No results for input(s): GLUCAP in the last 168 hours. Lipid Profile: No results for input(s): CHOL, HDL, LDLCALC, TRIG, CHOLHDL, LDLDIRECT in the last 72 hours. Thyroid Function Tests: No results for input(s): TSH, T4TOTAL, FREET4, T3FREE, THYROIDAB in the last 72 hours. Anemia Panel: No results for input(s): VITAMINB12, FOLATE, FERRITIN, TIBC, IRON, RETICCTPCT in the last 72 hours. Urine analysis:    Component Value Date/Time   COLORURINE YELLOW 12/21/2014 1409   APPEARANCEUR CLOUDY (A) 12/21/2014 1409   LABSPEC 1.013 12/21/2014 1409   PHURINE 5.5 12/21/2014 1409   GLUCOSEU NEGATIVE 12/21/2014 1409   HGBUR TRACE (A) 12/21/2014 1409   BILIRUBINUR NEGATIVE 12/21/2014 1409   KETONESUR NEGATIVE 12/21/2014 1409   PROTEINUR NEGATIVE 12/21/2014 1409   UROBILINOGEN 0.2 12/21/2014 1409   NITRITE POSITIVE (A) 12/21/2014 1409   LEUKOCYTESUR LARGE (A) 12/21/2014 1409   Sepsis Labs: @LABRCNTIP (procalcitonin:4,lacticidven:4) )No results found for this or any previous visit (from the past  240 hour(s)).   Radiological Exams on Admission: Dg Chest 2 View  Result Date: 08/18/2017 CLINICAL DATA:  Status post fall, with concern for chest injury. Fever. Initial encounter. EXAM: CHEST - 2 VIEW COMPARISON:  Chest radiograph performed 02/02/2014 FINDINGS: The lungs are well-aerated and clear. There is no evidence of focal opacification, pleural effusion or pneumothorax. The heart is normal in size; the mediastinal contour is within normal limits. No acute osseous abnormalities are seen. IMPRESSION: No acute cardiopulmonary process seen. No displaced rib fracture seen. Electronically Signed   By: Garald Balding M.D.   On: 08/18/2017 21:58   Ct Head Wo Contrast  Result Date: 08/18/2017 CLINICAL DATA:  Fall EXAM: CT HEAD WITHOUT CONTRAST CT CERVICAL SPINE WITHOUT CONTRAST TECHNIQUE: Multidetector CT imaging of the head and cervical spine was performed following the standard protocol without intravenous contrast. Multiplanar CT image reconstructions of the cervical spine were also generated. COMPARISON:  Head CT 09/29/2014, CT neck 05/10/2011 FINDINGS: CT HEAD FINDINGS Brain: No acute territorial infarction, hemorrhage or intracranial mass is seen. Moderate atrophy and small vessel ischemic changes of the white matter. Old lacunar infarcts in the right basal ganglia. Stable ventricle size Vascular: No hyperdense vessels.  Carotid vascular calcification Skull: Normal. Negative for fracture or focal lesion. Sinuses/Orbits: Mild mucosal thickening in the ethmoid sinuses. No acute orbital abnormality. Other: None CT CERVICAL SPINE FINDINGS Alignment: Reversal of cervical lordosis. Facet alignment within normal limits. Skull base and vertebrae: No acute fracture. No primary bone lesion or focal pathologic process. Soft tissues and spinal canal: No prevertebral fluid or swelling. No visible canal hematoma. Disc levels: Anterior plate and screw fixation at C3-C4 with bone fusion present. Marked degenerative  changes at C4-C5, C5-C6, C6-C7 and C7-T1 with moderate degenerative changes at C2-C3. Multiple level bilateral foraminal stenosis. Upper chest: Scarring and emphysema at the apices. Other: None IMPRESSION: 1. No CT evidence for acute intracranial abnormality. Atrophy and small vessel ischemic changes of the white matter. 2. Reversal of cervical lordosis with postsurgical changes at C3-C4 and multiple level moderate severe degenerative changes. No definite acute osseous abnormality. Electronically Signed   By: Donavan Foil M.D.   On: 08/18/2017 22:29   Ct Cervical Spine Wo Contrast  Result Date: 08/18/2017 CLINICAL DATA:  Fall EXAM: CT HEAD WITHOUT  CONTRAST CT CERVICAL SPINE WITHOUT CONTRAST TECHNIQUE: Multidetector CT imaging of the head and cervical spine was performed following the standard protocol without intravenous contrast. Multiplanar CT image reconstructions of the cervical spine were also generated. COMPARISON:  Head CT 09/29/2014, CT neck 05/10/2011 FINDINGS: CT HEAD FINDINGS Brain: No acute territorial infarction, hemorrhage or intracranial mass is seen. Moderate atrophy and small vessel ischemic changes of the white matter. Old lacunar infarcts in the right basal ganglia. Stable ventricle size Vascular: No hyperdense vessels.  Carotid vascular calcification Skull: Normal. Negative for fracture or focal lesion. Sinuses/Orbits: Mild mucosal thickening in the ethmoid sinuses. No acute orbital abnormality. Other: None CT CERVICAL SPINE FINDINGS Alignment: Reversal of cervical lordosis. Facet alignment within normal limits. Skull base and vertebrae: No acute fracture. No primary bone lesion or focal pathologic process. Soft tissues and spinal canal: No prevertebral fluid or swelling. No visible canal hematoma. Disc levels: Anterior plate and screw fixation at C3-C4 with bone fusion present. Marked degenerative changes at C4-C5, C5-C6, C6-C7 and C7-T1 with moderate degenerative changes at C2-C3. Multiple  level bilateral foraminal stenosis. Upper chest: Scarring and emphysema at the apices. Other: None IMPRESSION: 1. No CT evidence for acute intracranial abnormality. Atrophy and small vessel ischemic changes of the white matter. 2. Reversal of cervical lordosis with postsurgical changes at C3-C4 and multiple level moderate severe degenerative changes. No definite acute osseous abnormality. Electronically Signed   By: Donavan Foil M.D.   On: 08/18/2017 22:29     EKG: Independently reviewed.  Sinus rhythm, QTC 450, LAE, T-wave inversion in lead III/avF, Q wave in lead III   Assessment/Plan Principal Problem:   Sepsis ([place]) Active Problems:   Essential hypertension   History of CVA (cerebrovascular accident)   Gout   Fall   HLD (hyperlipidemia)   Dementia   Iron deficiency anemia   Acute renal failure superimposed on stage 3 chronic kidney disease ([place])   Sepsis ([place]): patient meets criteria for sepsis with leukocytosis, fever, tachycardia. Lactic acid is normal. Currently hemodynamically stable. The source of infection is not clear. Urinalysis is pending. Patient has mild cough, chest x-rays negative.  -will admit to tele bed as inpt -IV vancomycin and Zosyn was started in ED, will continue -will get Procalcitonin and trend lactic acid levels per sepsis protocol. -IVF: 2.5  NS bolus in ED, followed by 100 cc/h  -follow up blood culture, urine culture, urinalysis, flu pcr  Fall: likely had mechanical fall. CT head is negative. CT of C-spine is negative for acute issues, but showed degenerative disc disease. -PT/OT  Essential hypertension: -will hold HCTZ due to need of IVF -will hold irbesartan due to worsening renal function -Continue amlodipine -IVF hydralazine when necessary  History of CVA (cerebrovascular accident): has chronic mild left leg weakness. Patient's daughter reported that patient may have left arm weakness, but on my examination his left arm muscle strength is  normal, less likely to have new stroke. -Continue aspirin, pravastatin  Gout: -continue home allopurinol   HLD: -pravastatin  Dementia: no behavioral disturbance -donepezil  Iron deficiency anemia: -continue iron supplement  AoCKD-III: Baseline Cre is 1.3-1.6, pt's Cre is 2.7 and BUN 64 on admission. Likely due to prerenal secondary to dehydration and continuation of ARB, diuretics - IVF as above - Check FeUrea - Follow up renal function by BMP - Hold HCTZ and irbesartan   DVT ppx:  SQ Lovenox Code Status: Full code Family Communication:  Yes, patient's 2 daughters and son at bed side Disposition  Plan:  Anticipate discharge back to previous SNF Consults called:  none Admission status:  Inpatient/tele       Date of Service 08/19/2017    Ivor Costa [facility] Pager [phone]  If 7PM-7AM, please contact night-coverage [email or web address] Password [facility] 08/19/2017, 3:56 AM

## 2017-08-18 NOTE — ED Notes (Signed)
Phlebotomy at bedside.

## 2017-08-18 NOTE — ED Notes (Signed)
Pt put on hospital bed. Dr. Blaine Hamper @ bedside.

## 2017-08-19 ENCOUNTER — Encounter (HOSPITAL_COMMUNITY): Payer: Self-pay | Admitting: General Practice

## 2017-08-19 LAB — INFLUENZA PANEL BY PCR (TYPE A & B)
Influenza A By PCR: NEGATIVE
Influenza B By PCR: NEGATIVE

## 2017-08-19 LAB — BASIC METABOLIC PANEL
ANION GAP: 9 (ref 5–15)
BUN: 58 mg/dL — AB (ref 6–20)
CALCIUM: 7.1 mg/dL — AB (ref 8.9–10.3)
CO2: 12 mmol/L — AB (ref 22–32)
Chloride: 112 mmol/L — ABNORMAL HIGH (ref 101–111)
Creatinine, Ser: 2.41 mg/dL — ABNORMAL HIGH (ref 0.61–1.24)
GFR calc Af Amer: 27 mL/min — ABNORMAL LOW (ref >60)
GFR, EST NON AFRICAN AMERICAN: 23 mL/min — AB (ref >60)
GLUCOSE: 110 mg/dL — AB (ref 65–99)
Potassium: 3.7 mmol/L (ref 3.5–5.1)
Sodium: 133 mmol/L — ABNORMAL LOW (ref 135–145)

## 2017-08-19 LAB — CBC
HCT: 30.8 % — ABNORMAL LOW (ref 39.0–52.0)
HEMOGLOBIN: 10 g/dL — AB (ref 13.0–17.0)
MCH: 27 pg (ref 26.0–34.0)
MCHC: 32.5 g/dL (ref 30.0–36.0)
MCV: 83.2 fL (ref 78.0–100.0)
Platelets: 178 10*3/uL (ref 150–400)
RBC: 3.7 MIL/uL — ABNORMAL LOW (ref 4.22–5.81)
RDW: 16.5 % — AB (ref 11.5–15.5)
WBC: 12.3 10*3/uL — ABNORMAL HIGH (ref 4.0–10.5)

## 2017-08-19 LAB — URINALYSIS, ROUTINE W REFLEX MICROSCOPIC
Bacteria, UA: NONE SEEN
Bilirubin Urine: NEGATIVE
Glucose, UA: NEGATIVE mg/dL
HGB URINE DIPSTICK: NEGATIVE
Ketones, ur: NEGATIVE mg/dL
Leukocytes, UA: NEGATIVE
Nitrite: NEGATIVE
PROTEIN: 30 mg/dL — AB
SPECIFIC GRAVITY, URINE: 1.015 (ref 1.005–1.030)
pH: 5 (ref 5.0–8.0)

## 2017-08-19 LAB — CREATININE, URINE, RANDOM: CREATININE, URINE: 139.7 mg/dL

## 2017-08-19 LAB — PROCALCITONIN: PROCALCITONIN: 1.28 ng/mL

## 2017-08-19 LAB — LACTIC ACID, PLASMA: Lactic Acid, Venous: 1.4 mmol/L (ref 0.5–1.9)

## 2017-08-19 MED ORDER — PIPERACILLIN-TAZOBACTAM 3.375 G IVPB
3.3750 g | Freq: Three times a day (TID) | INTRAVENOUS | Status: DC
  Administered 2017-08-19 – 2017-08-20 (×5): 3.375 g via INTRAVENOUS
  Filled 2017-08-19 (×8): qty 50

## 2017-08-19 MED ORDER — VANCOMYCIN HCL 10 G IV SOLR
1500.0000 mg | Freq: Once | INTRAVENOUS | Status: AC
  Administered 2017-08-19: 1500 mg via INTRAVENOUS
  Filled 2017-08-19: qty 1500

## 2017-08-19 MED ORDER — METOPROLOL TARTRATE 5 MG/5ML IV SOLN: 5.0000 mg | Freq: Four times a day (QID) | INTRAVENOUS | Status: DC | PRN

## 2017-08-19 MED ORDER — VANCOMYCIN HCL IN DEXTROSE 750-5 MG/150ML-% IV SOLN: 750.0000 mg | INTRAVENOUS | Status: DC

## 2017-08-19 MED ORDER — DIVALPROEX SODIUM 250 MG PO DR TAB
500.0000 mg | DELAYED_RELEASE_TABLET | Freq: Every day | ORAL | Status: DC
  Administered 2017-08-19 – 2017-08-23 (×6): 500 mg via ORAL
  Filled 2017-08-19 (×6): qty 2

## 2017-08-19 MED ORDER — DIVALPROEX SODIUM 250 MG PO DR TAB
250.0000 mg | DELAYED_RELEASE_TABLET | Freq: Every day | ORAL | Status: DC
  Administered 2017-08-19 – 2017-08-24 (×6): 250 mg via ORAL
  Filled 2017-08-19 (×6): qty 1

## 2017-08-19 MED ORDER — ALBUTEROL SULFATE (2.5 MG/3ML) 0.083% IN NEBU
2.5000 mg | INHALATION_SOLUTION | Freq: Four times a day (QID) | RESPIRATORY_TRACT | Status: DC | PRN
  Administered 2017-08-19: 2.5 mg via RESPIRATORY_TRACT
  Filled 2017-08-19: qty 3

## 2017-08-19 MED ORDER — PHENOL 1.4 % MT LIQD
1.0000 | OROMUCOSAL | Status: DC | PRN
  Administered 2017-08-20 – 2017-08-22 (×3): 1 via OROMUCOSAL
  Filled 2017-08-19: qty 177

## 2017-08-19 NOTE — ED Notes (Signed)
Cough increasing and unable to giove meds. Dr. [place] Pink contacted for neb.

## 2017-08-19 NOTE — ED Notes (Signed)
P tgiven medication with apple sauce. Tolerates well.

## 2017-08-19 NOTE — ED Notes (Signed)
PT at bedside.

## 2017-08-19 NOTE — Evaluation (Signed)
Physical Therapy Evaluation Patient Details Name: Manuel King MRN: [id number] DOB: [birth year] Today's Date: 08/19/2017   History of Present Illness  Pt is an 84 y/o male admitted after fall at SNF. PMH includes dementia, prostate cancer, HTN, CVA.   Clinical Impression  Pt admitted secondary to problem above with deficits below. Pt requiring total to max A for basic bed mobility this session. Pt wanting to eat, so further mobility limited. Per pt's daughter, plan is to return to SNF at [facility] at d/c. Will continue to follow acutely to maximize functional mobility independence and safety.     Follow Up Recommendations SNF;Supervision/Assistance - 24 hour    Equipment Recommendations  None recommended by PT    Recommendations for Other Services       Precautions / Restrictions Precautions Precautions: Fall Restrictions Weight Bearing Restrictions: No      Mobility  Bed Mobility Overal bed mobility: Needs Assistance Bed Mobility: Rolling Rolling: Max assist;Total assist         General bed mobility comments: Pt wanting to eat, so mobility limited to rolling and scooting pt up in the bed. Required max A to roll to the L and required total assist to roll to the R. Total A +2 to scoot up in the bed.   Transfers                 General transfer comment: NT  Ambulation/Gait                Stairs            Wheelchair Mobility    Modified Rankin (Stroke Patients Only)       Balance Overall balance assessment: History of Falls                                           Pertinent Vitals/Pain Pain Assessment: No/denies pain    Home Living Family/patient expects to be discharged to:: Skilled nursing facility                      Prior Function Level of Independence: Needs assistance   Gait / Transfers Assistance Needed: Per family, pt was at a nursing facility and used rollator for ambulation.   ADL's /  Homemaking Assistance Needed: Reports staff assisted with ADL tasks.         Hand Dominance   Dominant Hand: Right    Extremity/Trunk Assessment   Upper Extremity Assessment Upper Extremity Assessment: Defer to OT evaluation    Lower Extremity Assessment Lower Extremity Assessment: LLE deficits/detail LLE Deficits / Details: LLE weakness at baseline.     Cervical / Trunk Assessment Cervical / Trunk Assessment: Kyphotic  Communication   Communication: HOH  Cognition Arousal/Alertness: Awake/alert Behavior During Therapy: WFL for tasks assessed/performed Overall Cognitive Status: History of cognitive impairments - at baseline                                 General Comments: Dementia at baseline       General Comments General comments (skin integrity, edema, etc.): Pt's daughters present during session and assisted with history and PLOF    Exercises     Assessment/Plan    PT Assessment Patient needs continued PT services  PT Problem List Decreased strength;Decreased balance;Decreased mobility;Decreased  cognition;Decreased knowledge of use of DME;Decreased knowledge of precautions       PT Treatment Interventions DME instruction;Gait training;Functional mobility training;Neuromuscular re-education;Balance training;Therapeutic exercise;Therapeutic activities;Patient/family education    PT Goals (Current goals can be found in the Care Plan section)  Acute Rehab PT Goals Patient Stated Goal: For pt to return to [place] per family  PT Goal Formulation: With family Time For Goal Achievement: 09/02/17 Potential to Achieve Goals: Fair    Frequency Min 2X/week   Barriers to discharge        Co-evaluation               AM-PAC PT "6 Clicks" Daily Activity  Outcome Measure Difficulty turning over in bed (including adjusting bedclothes, sheets and blankets)?: Unable Difficulty moving from lying on back to sitting on the side of the bed? :  Unable Difficulty sitting down on and standing up from a chair with arms (e.g., wheelchair, bedside commode, etc,.)?: Unable Help needed moving to and from a bed to chair (including a wheelchair)?: Total Help needed walking in hospital room?: Total Help needed climbing 3-5 steps with a railing? : Total 6 Click Score: 6    End of Session   Activity Tolerance: Patient tolerated treatment well Patient left: in bed;with call bell/phone within reach;with family/visitor present Nurse Communication: Mobility status PT Visit Diagnosis: Unsteadiness on feet (R26.81);Muscle weakness (generalized) (M62.81);History of falling (Z91.81)    Time: 5732-2025 PT Time Calculation (min) (ACUTE ONLY): 11 min   Charges:   PT Evaluation $PT Eval Moderate Complexity: 1 Mod     PT G Codes:        Leighton Ruff, PT, DPT  Acute Rehabilitation Services  Pager: [phone]   Rudean Hitt 08/19/2017, 6:27 PM

## 2017-08-19 NOTE — Evaluation (Signed)
Occupational Therapy Evaluation Patient Details Name: Manuel King MRN: [id number] DOB: 10-Oct-1929 Today's Date: 08/19/2017    History of Present Illness pt was admitted from ALF for fall, fever and generalized weakness.  PMH  HTN, CVA, dementia, HTN   Clinical Impression   This 84 year old man was admitted for the above.  Per chart, he was walking with a RW at baseline.  Unsure about PLOF with adls and pt has a h/o dementia at baseline. Will follow in acute setting and will focus on mobility related to adls to decrease burden of care.    Follow Up Recommendations  SNF    Equipment Recommendations  None recommended by OT    Recommendations for Other Services       Precautions / Restrictions Precautions Precautions: Fall Restrictions Weight Bearing Restrictions: No      Mobility Bed Mobility Overal bed mobility: Needs Assistance Bed Mobility: Rolling;Supine to Sit;Sit to Supine Rolling: (mod to L; total to R)   Supine to sit: Max assist;Total assist(HOB raised; could not sit in midline) Sit to supine: Max assist   General bed mobility comments: unable to sit up near midline; lean to L.   Transfers                 General transfer comment: NT    Balance Overall balance assessment: Needs assistance;History of Falls   Sitting balance-Leahy Scale: Zero Sitting balance - Comments: unable to shift from L lean to midline with +1 assist                                   ADL either performed or assessed with clinical judgement   ADL Overall ADL's : Needs assistance/impaired  Pt not currently eating:  Coughed with RN; SLP pending   Grooming: Minimal assistance;Bed level;Wash/dry face   Upper Body Bathing: Maximal assistance;Bed level   Lower Body Bathing: Total assistance;+2 for physical assistance;Bed level   Upper Body Dressing : Maximal assistance;Bed level   Lower Body Dressing: Total assistance;+2 for physical assistance;Sit to/from  stand                 General ADL Comments: Pt assisted to partial sitting at EOB; unable to bring him up completely; leaning to L.  Called NT and assisted with cleaning pt up and changing sheets as condom catheter was leaking and bed was saturated     Vision         Perception     Praxis      Pertinent Vitals/Pain Pain Assessment: No/denies pain     Hand Dominance Right   Extremity/Trunk Assessment Upper Extremity Assessment Upper Extremity Assessment: Generalized weakness;LUE deficits/detail LUE Deficits / Details: limited ROM L shoulder; able to lift approximately 30 degrees; able to use hand on bedrail           Communication Communication Communication: HOH   Cognition Arousal/Alertness: Awake/alert Behavior During Therapy: WFL for tasks assessed/performed Overall Cognitive Status: No family/caregiver present to determine baseline cognitive functioning                                 General Comments: follows one step commands   General Comments       Exercises     Shoulder Instructions      Home Living Family/patient expects to be discharged to:: Unsure  Additional Comments: anticipate he will need SNF      Prior Functioning/Environment          Comments: unsure; from ALF per chart and used RW.  Pt states he is from [facility] [employer]        OT Problem List: Decreased strength;Decreased range of motion;Decreased activity tolerance;Impaired balance (sitting and/or standing);Decreased cognition      OT Treatment/Interventions: Self-care/ADL training;Therapeutic exercise;DME and/or AE instruction;Balance training;Patient/family education;Therapeutic activities    OT Goals(Current goals can be found in the care plan section) Acute Rehab OT Goals Patient Stated Goal: none stated OT Goal Formulation: Patient unable to participate in goal setting Time For Goal Achievement:  09/02/17 Potential to Achieve Goals: Good ADL Goals Pt Will Transfer to Toilet: with mod assist;with +2 assist;stand pivot transfer;bedside commode Additional ADL Goal #1: pt will sit EOB x 5 minutes with min guard while performing light ADL activity or UE exercise Additional ADL Goal #2: pt will go from sit to stand with mod +2 assistance for adls  OT Frequency: Min 2X/week   Barriers to D/C:            Co-evaluation              AM-PAC PT "6 Clicks" Daily Activity     Outcome Measure Help from another person eating meals?: A Lot Help from another person taking care of personal grooming?: A Little Help from another person toileting, which includes using toliet, bedpan, or urinal?: Total Help from another person bathing (including washing, rinsing, drying)?: A Lot Help from another person to put on and taking off regular upper body clothing?: A Lot Help from another person to put on and taking off regular lower body clothing?: Total 6 Click Score: 11   End of Session    Activity Tolerance: Patient tolerated treatment well Patient left: in bed;with call bell/phone within reach;with bed alarm set;with nursing/sitter in room  OT Visit Diagnosis: Muscle weakness (generalized) (M62.81)                Time: 1104-1130 OT Time Calculation (min): 26 min Charges:  OT General Charges $OT Visit: 1 Visit OT Evaluation $OT Eval Low Complexity: 1 Low OT Treatments $Self Care/Home Management : 8-22 mins G-Codes:     [place], OTR/L [phone] 08/19/2017  Manuel King 08/19/2017, 12:04 PM

## 2017-08-19 NOTE — ED Notes (Signed)
Pt cleaned of urine and fresh linen changed. Pt assist minimal amount with turn.

## 2017-08-19 NOTE — Progress Notes (Signed)
Patient arrived to [facility] notified. Admission assessment completed. Fall risk bundle implemented. Patient in high-low bed. Bed alarm activated. No complaints at this time. Will continue to monitor.

## 2017-08-19 NOTE — Progress Notes (Signed)
PROGRESS NOTE  Manuel King SWN:[id number] DOB: [birth year] DOA: 08/18/2017 PCP: Merrilee Seashore, MD  HPI/Recap of past 28 hours: 84 year old male past medical history of dementia, hypertension, prostate CA, right lower extremity weakness secondary to CVA and stage III chronic kidney disease who had generalized weakness for the past few days and then fell while walking at his assisted living facility.  No reported loss of consciousness.  Patient was brought in and found to have a white count of 14, creatinine of 2.7 (1.6 2 years ago) and fever of 103.2.  Urinalysis and chest x-ray unrevealing.  Patient admitted and treatment with IV fluids and antibiotics started for sepsis of unknown etiology.  By morning, white count and creatinine with some mild improvement.  No further fevers.  Per calcitonin level elevated at 1.28.  Nursing noted that patient was choking, even with pills.  Made n.p.o. and speech therapy consulted.  Patient himself does not really have any complaints, he opens his eyes and says a few words when I talk to him and then closes his eyes and goes back to sleep.  Assessment/Plan: Principal Problem:   Sepsis [facility]): Patient met criteria for sepsis on admission given leukocytosis, likely pulmonary source, tachycardia and tachypnea with endorgan damage.  Continue IV fluids and antibiotics.  Checking swallow eval to rule out aspiration Active Problems:   Essential hypertension: Blood pressure stable, continue to monitor   History of CVA (cerebrovascular accident)   Gout   Fall: Secondary to weakness and sepsis   HLD (hyperlipidemia)   Dementia with behavioral disturbance: Likely secondary to infection.  Monitor closely.  Continue Aricept and Depakote.   Iron deficiency anemia   Acute renal failure superimposed on stage 3 chronic kidney disease ([place]): Continue IV fluids   Code Status: Full code  Family Communication: Left message for family  Disposition Plan: We will be  here for several days until infection stabilized, swallowing evaluated   Consultants:  None  Procedures:  None  Antimicrobials:  IV Zosyn and vancomycin 4/16-present  DVT prophylaxis: Lovenox   Objective: Vitals:   08/19/17 1230 08/19/17 1238 08/19/17 1245 08/19/17 1300  BP: (!) 133/51  (!) 141/61 (!) 141/59  Pulse: 73  79 73  Resp: (!) 24  (!) 25 (!) 23  Temp:  100.2 F (37.9 C)    TempSrc:      SpO2: 92%  93% 94%    Intake/Output Summary (Last 24 hours) at 08/19/2017 1330 Last data filed at 08/19/2017 0201 Gross per 24 hour  Intake 2750 ml  Output -  Net 2750 ml   There were no vitals filed for this visit. There is no height or weight on file to calculate BMI.  Exam:   General: Somnolent, oriented x1  HEENT: Normocephalic, atraumatic, mucous members slightly dry  Neck: Supple, no JVD  Cardiovascular: Regular rate and rhythm, S1-S2  Respiratory: Clear to auscultation bilaterally, no crackles or wheezes  Abdomen: Soft, nontender, nondistended, hypoactive bowel sounds  Musculoskeletal: No clubbing or cyanosis, trace pitting edema  Skin: No skin breaks, tears or lesions  Psychiatry: Underlying dementia, no acute psychoses   Data Reviewed: CBC: Recent Labs  Lab 08/18/17 2117 08/19/17 0445  WBC 14.0* 12.3*  NEUTROABS 11.5*  --   HGB 11.1* 10.0*  HCT 32.5* 30.8*  MCV 81.5 83.2  PLT 222 938   Basic Metabolic Panel: Recent Labs  Lab 08/18/17 2117 08/19/17 0445  NA 133* 133*  K 3.9 3.7  CL 107 112*  CO2  13* 12*  GLUCOSE 125* 110*  BUN 64* 58*  CREATININE 2.70* 2.41*  CALCIUM 8.3* 7.1*   GFR: CrCl cannot be calculated (Unknown ideal weight.). Liver Function Tests: Recent Labs  Lab 08/18/17 2117  AST 21  ALT 18  ALKPHOS 64  BILITOT 1.1  PROT 7.7  ALBUMIN 2.8*   No results for input(s): LIPASE, AMYLASE in the last 168 hours. No results for input(s): AMMONIA in the last 168 hours. Coagulation Profile: No results for input(s):  INR, PROTIME in the last 168 hours. Cardiac Enzymes: No results for input(s): CKTOTAL, CKMB, CKMBINDEX, TROPONINI in the last 168 hours. BNP (last 3 results) No results for input(s): PROBNP in the last 8760 hours. HbA1C: No results for input(s): HGBA1C in the last 72 hours. CBG: No results for input(s): GLUCAP in the last 168 hours. Lipid Profile: No results for input(s): CHOL, HDL, LDLCALC, TRIG, CHOLHDL, LDLDIRECT in the last 72 hours. Thyroid Function Tests: No results for input(s): TSH, T4TOTAL, FREET4, T3FREE, THYROIDAB in the last 72 hours. Anemia Panel: No results for input(s): VITAMINB12, FOLATE, FERRITIN, TIBC, IRON, RETICCTPCT in the last 72 hours. Urine analysis:    Component Value Date/Time   COLORURINE YELLOW 08/19/2017 0258   APPEARANCEUR CLEAR 08/19/2017 0258   LABSPEC 1.015 08/19/2017 0258   PHURINE 5.0 08/19/2017 0258   GLUCOSEU NEGATIVE 08/19/2017 0258   HGBUR NEGATIVE 08/19/2017 0258   BILIRUBINUR NEGATIVE 08/19/2017 0258   KETONESUR NEGATIVE 08/19/2017 0258   PROTEINUR 30 (A) 08/19/2017 0258   UROBILINOGEN 0.2 12/21/2014 1409   NITRITE NEGATIVE 08/19/2017 0258   LEUKOCYTESUR NEGATIVE 08/19/2017 0258   Sepsis Labs: _0 (procalcitonin:4,lacticidven:4)  )No results found for this or any previous visit (from the past 240 hour(s)).    Studies: Dg Chest 2 View  Result Date: 08/18/2017 CLINICAL DATA:  Status post fall, with concern for chest injury. Fever. Initial encounter. EXAM: CHEST - 2 VIEW COMPARISON:  Chest radiograph performed 02/02/2014 FINDINGS: The lungs are well-aerated and clear. There is no evidence of focal opacification, pleural effusion or pneumothorax. The heart is normal in size; the mediastinal contour is within normal limits. No acute osseous abnormalities are seen. IMPRESSION: No acute cardiopulmonary process seen. No displaced rib fracture seen. Electronically Signed   By: Garald Balding M.D.   On: 08/18/2017 21:58   Ct Head Wo  Contrast  Result Date: 08/18/2017 CLINICAL DATA:  Fall EXAM: CT HEAD WITHOUT CONTRAST CT CERVICAL SPINE WITHOUT CONTRAST TECHNIQUE: Multidetector CT imaging of the head and cervical spine was performed following the standard protocol without intravenous contrast. Multiplanar CT image reconstructions of the cervical spine were also generated. COMPARISON:  Head CT 09/29/2014, CT neck 05/10/2011 FINDINGS: CT HEAD FINDINGS Brain: No acute territorial infarction, hemorrhage or intracranial mass is seen. Moderate atrophy and small vessel ischemic changes of the white matter. Old lacunar infarcts in the right basal ganglia. Stable ventricle size Vascular: No hyperdense vessels.  Carotid vascular calcification Skull: Normal. Negative for fracture or focal lesion. Sinuses/Orbits: Mild mucosal thickening in the ethmoid sinuses. No acute orbital abnormality. Other: None CT CERVICAL SPINE FINDINGS Alignment: Reversal of cervical lordosis. Facet alignment within normal limits. Skull base and vertebrae: No acute fracture. No primary bone lesion or focal pathologic process. Soft tissues and spinal canal: No prevertebral fluid or swelling. No visible canal hematoma. Disc levels: Anterior plate and screw fixation at C3-C4 with bone fusion present. Marked degenerative changes at C4-C5, C5-C6, C6-C7 and C7-T1 with moderate degenerative changes at C2-C3. Multiple level bilateral foraminal stenosis.  Upper chest: Scarring and emphysema at the apices. Other: None IMPRESSION: 1. No CT evidence for acute intracranial abnormality. Atrophy and small vessel ischemic changes of the white matter. 2. Reversal of cervical lordosis with postsurgical changes at C3-C4 and multiple level moderate severe degenerative changes. No definite acute osseous abnormality. Electronically Signed   By: Donavan Foil M.D.   On: 08/18/2017 22:29   Ct Cervical Spine Wo Contrast  Result Date: 08/18/2017 CLINICAL DATA:  Fall EXAM: CT HEAD WITHOUT CONTRAST CT  CERVICAL SPINE WITHOUT CONTRAST TECHNIQUE: Multidetector CT imaging of the head and cervical spine was performed following the standard protocol without intravenous contrast. Multiplanar CT image reconstructions of the cervical spine were also generated. COMPARISON:  Head CT 09/29/2014, CT neck 05/10/2011 FINDINGS: CT HEAD FINDINGS Brain: No acute territorial infarction, hemorrhage or intracranial mass is seen. Moderate atrophy and small vessel ischemic changes of the white matter. Old lacunar infarcts in the right basal ganglia. Stable ventricle size Vascular: No hyperdense vessels.  Carotid vascular calcification Skull: Normal. Negative for fracture or focal lesion. Sinuses/Orbits: Mild mucosal thickening in the ethmoid sinuses. No acute orbital abnormality. Other: None CT CERVICAL SPINE FINDINGS Alignment: Reversal of cervical lordosis. Facet alignment within normal limits. Skull base and vertebrae: No acute fracture. No primary bone lesion or focal pathologic process. Soft tissues and spinal canal: No prevertebral fluid or swelling. No visible canal hematoma. Disc levels: Anterior plate and screw fixation at C3-C4 with bone fusion present. Marked degenerative changes at C4-C5, C5-C6, C6-C7 and C7-T1 with moderate degenerative changes at C2-C3. Multiple level bilateral foraminal stenosis. Upper chest: Scarring and emphysema at the apices. Other: None IMPRESSION: 1. No CT evidence for acute intracranial abnormality. Atrophy and small vessel ischemic changes of the white matter. 2. Reversal of cervical lordosis with postsurgical changes at C3-C4 and multiple level moderate severe degenerative changes. No definite acute osseous abnormality. Electronically Signed   By: Donavan Foil M.D.   On: 08/18/2017 22:29    Scheduled Meds: . allopurinol  100 mg Oral Daily  . amLODipine  5 mg Oral Daily  . artificial tears  1 application Both Eyes QHS  . aspirin EC  81 mg Oral Daily  . divalproex  250 mg Oral Daily    And  . divalproex  500 mg Oral QHS  . docusate sodium  100 mg Oral Daily  . donepezil  10 mg Oral QHS  . enoxaparin (LOVENOX) injection  30 mg Subcutaneous Daily  . ferrous sulfate  325 mg Oral BID WC  . oxybutynin  5 mg Oral TID  . PARoxetine  20 mg Oral Daily  . pravastatin  40 mg Oral QHS  . traMADol  50 mg Oral Daily    Continuous Infusions: . sodium chloride 100 mL/hr (08/19/17 0920)  . piperacillin-tazobactam (ZOSYN)  IV 3.375 g (08/19/17 1038)  . [START ON 08/21/2017] vancomycin       LOS: 1 day     Annita Brod, MD [facility]  To reach me or the doctor on call, go to: [email or web address] Password [email or web address]  08/19/2017, 1:30 PM

## 2017-08-19 NOTE — ED Notes (Addendum)
Pt begins coughing afer takes portion of medication. Dr. [place] Pink contacted and states to hold PO  will order SLP eval.

## 2017-08-19 NOTE — ED Notes (Signed)
Coughing has stopped and pt states he is feeling better

## 2017-08-19 NOTE — Progress Notes (Signed)
Pharmacy Antibiotic Note  Manuel King is a 84 y.o. male admitted on 08/18/2017 with fevers, possible sepsis.  Pharmacy has been consulted for Vancomycin and Zosyn  dosing.  Plan: Vancomycin 1500 mg IV now, then 750 mg IV q48h Zosyn 3.375 g IV q8h     Temp (24hrs), Avg:103.2 F (39.6 C), Min:103.2 F (39.6 C), Max:103.2 F (39.6 C)  Recent Labs  Lab 08/18/17 2114 08/18/17 2117  WBC  --  14.0*  CREATININE  --  2.70*  LATICACIDVEN 1.14  --     CrCl cannot be calculated (Unknown ideal weight.).    No Known Allergies  Caryl Pina 08/19/2017 12:07 AM

## 2017-08-19 NOTE — Evaluation (Signed)
Clinical/Bedside Swallow Evaluation Patient Details  Name: Manuel King MRN: [id number] Date of Birth: Nov 02, 1929  Today's Date: 08/19/2017 Time: SLP Start Time (ACUTE ONLY): 1438 SLP Stop Time (ACUTE ONLY): 1450 SLP Time Calculation (min) (ACUTE ONLY): 12 min  Past Medical History:  Past Medical History:  Diagnosis Date  . Arthritis   . Cellulitis currently   right foot  . CVA (cerebral vascular accident) ([place])   . Dementia   . Hypertension   . Prostate cancer [facility])    Past Surgical History:  Past Surgical History:  Procedure Laterality Date  . BACK SURGERY    . NECK SURGERY    . prostate seed implants    . spider bite surgery     on left leg   HPI:  84 year old male past medical history of dementia, hypertension, prostate CA, right lower extremity weakness secondary to CVA and stage III chronic kidney disease who had generalized weakness for the past few days and then fell while walking at his assisted living facility. Urinalysis and chest x-ray unrevealing. Patient admitted and treatment with IV fluids and antibiotics started for sepsis of unknown etiology.  RN reports concern for aspiraiton given hard prolonged coughing with meds this am. 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    Assessment / Plan / Recommendation Clinical Impression  Pt demonstrates no significant indication of dysphagia on assessment. Pt was coughing upon SLP arrival, but throughout 8 oz of intake with water pt has one throat clear after a belch. Mastication was slightly prolonged given missing dentition. Recommend pt resume a mechanical soft diet with thin liquids, will f/u to check for tolerance given inconclusive infection source and potential for waxing and waning function with baseline dementia. Follow aspiration precautions including upright posture, suggest meds are given whole in puree.  SLP Visit Diagnosis: Dysphagia, unspecified (R13.10)    Aspiration  Risk  Mild aspiration risk    Diet Recommendation Dysphagia 3 (Mech soft);Thin liquid   Liquid Administration via: Cup;Straw Medication Administration: Whole meds with puree Supervision: Patient able to self feed Compensations: Slow rate;Small sips/bites    Other  Recommendations     Follow up Recommendations Skilled Nursing facility      Frequency and Duration min 2x/week  2 weeks       Prognosis        Swallow Study   General HPI: 84 year old male past medical history of dementia, hypertension, prostate CA, right lower extremity weakness secondary to CVA and stage III chronic kidney disease who had generalized weakness for the past few days and then fell while walking at his assisted living facility. Urinalysis and chest x-ray unrevealing. Patient admitted and treatment with IV fluids and antibiotics started for sepsis of unknown etiology.  RN reports concern for aspiraiton given hard prolonged coughing with meds this am. 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  Type of Study: Bedside Swallow Evaluation Previous Swallow Assessment: none Diet Prior to this Study: NPO Temperature Spikes Noted: Yes Respiratory Status: Venti-mask History of Recent Intubation: No Behavior/Cognition: Cooperative Oral Cavity Assessment: Within Functional Limits Oral Care Completed by SLP: No Oral Cavity - Dentition: Missing dentition Vision: Functional for self-feeding Self-Feeding Abilities: Needs assist Patient Positioning: Upright in bed Baseline Vocal Quality: Normal Volitional Cough: Strong Volitional Swallow: Able to elicit    Oral/Motor/Sensory Function Overall Oral Motor/Sensory Function: Within functional limits   Ice Chips Ice chips: Within functional  limits   Thin Liquid Thin Liquid: Impaired Presentation: Cup;Straw Pharyngeal  Phase Impairments: Cough - Immediate    Nectar Thick Nectar Thick Liquid: Not tested   Honey Thick Honey  Thick Liquid: Not tested   Puree Puree: Within functional limits Presentation: Spoon   Solid   GO   Solid: Within functional limits       [facility], MA CCC-SLP [phone]  Lynann Beaver 08/19/2017,2:53 PM

## 2017-08-19 NOTE — ED Notes (Signed)
Pt rouses briefly and is oriented to person place and situation but not the day. Pt then returns to sleep. Grips with right hand but not left. Moves feet to command.Warm blanket given.

## 2017-08-19 NOTE — ED Notes (Signed)
ORDERED LUNCH TRAY 

## 2017-08-20 ENCOUNTER — Inpatient Hospital Stay: Payer: Self-pay

## 2017-08-20 LAB — CBC
HEMATOCRIT: 26 % — AB (ref 39.0–52.0)
Hemoglobin: 8.7 g/dL — ABNORMAL LOW (ref 13.0–17.0)
MCH: 27.4 pg (ref 26.0–34.0)
MCHC: 33.5 g/dL (ref 30.0–36.0)
MCV: 81.8 fL (ref 78.0–100.0)
Platelets: 193 10*3/uL (ref 150–400)
RBC: 3.18 MIL/uL — ABNORMAL LOW (ref 4.22–5.81)
RDW: 16.6 % — ABNORMAL HIGH (ref 11.5–15.5)
WBC: 13 10*3/uL — ABNORMAL HIGH (ref 4.0–10.5)

## 2017-08-20 LAB — BASIC METABOLIC PANEL
Anion gap: 10 (ref 5–15)
BUN: 37 mg/dL — ABNORMAL HIGH (ref 6–20)
CHLORIDE: 116 mmol/L — AB (ref 101–111)
CO2: 11 mmol/L — ABNORMAL LOW (ref 22–32)
Calcium: 6.3 mg/dL — CL (ref 8.9–10.3)
Creatinine, Ser: 1.78 mg/dL — ABNORMAL HIGH (ref 0.61–1.24)
GFR calc Af Amer: 38 mL/min — ABNORMAL LOW (ref >60)
GFR, EST NON AFRICAN AMERICAN: 33 mL/min — AB (ref >60)
GLUCOSE: 86 mg/dL (ref 65–99)
POTASSIUM: 3.1 mmol/L — AB (ref 3.5–5.1)
Sodium: 137 mmol/L (ref 135–145)

## 2017-08-20 LAB — URINE CULTURE: CULTURE: NO GROWTH

## 2017-08-20 LAB — UREA NITROGEN, URINE: Urea Nitrogen, Ur: 703 mg/dL

## 2017-08-20 MED ORDER — VANCOMYCIN HCL 10 G IV SOLR
1250.0000 mg | INTRAVENOUS | Status: DC
  Administered 2017-08-20: 1250 mg via INTRAVENOUS
  Filled 2017-08-20 (×2): qty 1250

## 2017-08-20 MED ORDER — SODIUM CHLORIDE 0.9% FLUSH
10.0000 mL | INTRAVENOUS | Status: DC | PRN
  Administered 2017-08-21: 10 mL
  Filled 2017-08-20: qty 40

## 2017-08-20 MED ORDER — HYDRALAZINE HCL 25 MG PO TABS
25.0000 mg | ORAL_TABLET | Freq: Three times a day (TID) | ORAL | Status: DC
  Administered 2017-08-20 – 2017-08-24 (×11): 25 mg via ORAL
  Filled 2017-08-20 (×12): qty 1

## 2017-08-20 MED ORDER — SODIUM BICARBONATE 8.4 % IV SOLN
INTRAVENOUS | Status: AC
  Administered 2017-08-20: via INTRAVENOUS
  Filled 2017-08-20 (×2): qty 50

## 2017-08-20 MED ORDER — POTASSIUM CHLORIDE CRYS ER 20 MEQ PO TBCR
40.0000 meq | EXTENDED_RELEASE_TABLET | Freq: Once | ORAL | Status: AC
  Administered 2017-08-20: 40 meq via ORAL
  Filled 2017-08-20: qty 2

## 2017-08-20 NOTE — Progress Notes (Signed)
Pharmacy Antibiotic Note  Manuel King is a 84 y.o. male on day # 3 Vanc and Zosyn for sepsis coverage.  Vanc 1500 mg IV given ~2am on 4/17 and next dose planned 4/19 am (q48h regimen)  Scr improved, down from 2.70 on admit to 1.78 today.  Tmax 100.2, WBC 13.0.  Cultures negative to date.   Plan:  Adjust Vanc dose to 1250 mg IV q24hrs - begin today.  Continue Zosyn 3.375 gm IV q8hrs (each over 4 hours)  Follow renal function, culture data, progess.  Height: 6\' 4"  (193 cm) Weight: 223 lb (101.2 kg) IBW/kg (Calculated) : 86.8  Temp (24hrs), Avg:98.3 F (36.8 C), Min:97.4 F (36.3 C), Max:100.2 F (37.9 C)  Recent Labs  Lab 08/18/17 2114 08/18/17 2117 08/19/17 0445 08/20/17 0432  WBC  --  14.0* 12.3* 13.0*  CREATININE  --  2.70* 2.41* 1.78*  LATICACIDVEN 1.14  --  1.4  --     Estimated Creatinine Clearance: 37.3 mL/min (A) (by C-G formula based on SCr of 1.78 mg/dL (H)).    No Known Allergies  Antimicrobials this admission:    Vanc 4/16>>    Zosyn 4/16>>  Dose adjustments this admission:  4/18: Scr improved, Vanc adjusted 750 mg IV q48h to 1250 mg IV q24hrs  Microbiology results:    4/17 blood x 2 - ng < 24  hrs to date in one. 2nd one sent?    4/17 urine - negative    4/17 Influenza PCR negative  Thank you for allowing pharmacy to be a part of this patient's care.  Arty Baumgartner, [place] Pager: [phone] 08/20/2017 10:19 AM

## 2017-08-20 NOTE — Progress Notes (Signed)
PROGRESS NOTE  Manuel King NHA:[id number] DOB: [birth year] DOA: 08/18/2017 PCP: Merrilee Seashore, MD  HPI/Recap of past 9 hours: 84 year old male past medical history of dementia, hypertension, prostate CA, right lower extremity weakness secondary to CVA and stage III chronic kidney disease who had generalized weakness for the past few days and then fell while walking at his assisted living facility.  No reported loss of consciousness.  Patient was brought in and found to have a white count of 14, creatinine of 2.7 (1.6 2 years ago) and fever of 103.2.  Urinalysis and chest x-ray unrevealing.  Patient admitted and treatment with IV fluids and antibiotics started for sepsis of unknown etiology.  By morning, white count and creatinine initially improved.  White count now staying minimally elevated, but creatinine back to normal.  Pro-calcitonin level at 1.28.  After noticing the patient was choking, even with pills, evaluated by speech therapy who put patient on dysphagia 3 diet with thin liquids.  Today, patient is more awake and alert.  Still very weak after seen by physical therapy, recommending return to short-term skilled nursing.  Patient doing okay, complains of a cough, no shortness of breath  Assessment/Plan: Principal Problem:   Sepsis [facility]): Patient met criteria for sepsis on admission given leukocytosis, likely pulmonary source, tachycardia and tachypnea with end organ damage.  Continue IV fluids and antibiotics.  Source may be aspiration pneumonia although, not clear.  Continue antibiotics.   Active Problems:   Essential hypertension: Blood pressure trending upwards.  Will restart hydralazine.   History of CVA (cerebrovascular accident)   Gout   Fall: Secondary to weakness and sepsis   HLD (hyperlipidemia)   Dementia with behavioral disturbance: Likely secondary to infection.  Monitor closely.  Continue Aricept and Depakote.   Iron deficiency anemia: Hemoglobin trending downward  following hydration, more than usual baseline.  Continue to follow hemoglobin.    Acute renal failure superimposed on stage 3 chronic kidney disease ([place]): Back to baseline although bicarb still off.  Initiate bicarb infusion and stop IV fluids.   Code Status: Full code  Family Communication: Updated family by phone.  Disposition Plan: Potential discharge in next 1-2 days as long as hemoglobin stabilized, bicarb stable   Consultants:  None  Procedures:  None  Antimicrobials:  IV Zosyn and vancomycin 4/16-present  DVT prophylaxis: Lovenox   Objective: Vitals:   08/20/17 0039 08/20/17 0543 08/20/17 0824 08/20/17 1019  BP:  137/74 (!) 164/74 (!) 177/84  Pulse:  69 72   Resp:   18   Temp:  (!) 97.4 F (36.3 C) (!) 97.5 F (36.4 C)   TempSrc:  Oral Oral   SpO2:  99% 99%   Weight:      Height: '6\' 4"'  (1.93 m)       Intake/Output Summary (Last 24 hours) at 08/20/2017 1456 Last data filed at 08/20/2017 1400 Gross per 24 hour  Intake 2170 ml  Output 1325 ml  Net 845 ml   Filed Weights   08/20/17 0013  Weight: 101.2 kg (223 lb)   Body mass index is 27.14 kg/m.  Exam:   General: More awake and alert, oriented x2  HEENT: Normocephalic, atraumatic, mucous members slightly dry  Neck: Supple, no JVD  Cardiovascular: Regular rate and rhythm, S1-S2  Respiratory: Clear to auscultation bilaterally, no crackles or wheezes  Abdomen: Soft, nontender, nondistended, hypoactive bowel sounds  Musculoskeletal: No clubbing or cyanosis, trace pitting edema  Skin: No skin breaks, tears or lesions  Psychiatry:  Underlying dementia, no acute psychoses   Data Reviewed: CBC: Recent Labs  Lab 08/18/17 2117 08/19/17 0445 08/20/17 0432  WBC 14.0* 12.3* 13.0*  NEUTROABS 11.5*  --   --   HGB 11.1* 10.0* 8.7*  HCT 32.5* 30.8* 26.0*  MCV 81.5 83.2 81.8  PLT 222 178 301   Basic Metabolic Panel: Recent Labs  Lab 08/18/17 2117 08/19/17 0445 08/20/17 0432  NA 133*  133* 137  K 3.9 3.7 3.1*  CL 107 112* 116*  CO2 13* 12* 11*  GLUCOSE 125* 110* 86  BUN 64* 58* 37*  CREATININE 2.70* 2.41* 1.78*  CALCIUM 8.3* 7.1* 6.3*   GFR: Estimated Creatinine Clearance: 37.3 mL/min (A) (by C-G formula based on SCr of 1.78 mg/dL (H)). Liver Function Tests: Recent Labs  Lab 08/18/17 2117  AST 21  ALT 18  ALKPHOS 64  BILITOT 1.1  PROT 7.7  ALBUMIN 2.8*   No results for input(s): LIPASE, AMYLASE in the last 168 hours. No results for input(s): AMMONIA in the last 168 hours. Coagulation Profile: No results for input(s): INR, PROTIME in the last 168 hours. Cardiac Enzymes: No results for input(s): CKTOTAL, CKMB, CKMBINDEX, TROPONINI in the last 168 hours. BNP (last 3 results) No results for input(s): PROBNP in the last 8760 hours. HbA1C: No results for input(s): HGBA1C in the last 72 hours. CBG: No results for input(s): GLUCAP in the last 168 hours. Lipid Profile: No results for input(s): CHOL, HDL, LDLCALC, TRIG, CHOLHDL, LDLDIRECT in the last 72 hours. Thyroid Function Tests: No results for input(s): TSH, T4TOTAL, FREET4, T3FREE, THYROIDAB in the last 72 hours. Anemia Panel: No results for input(s): VITAMINB12, FOLATE, FERRITIN, TIBC, IRON, RETICCTPCT in the last 72 hours. Urine analysis:    Component Value Date/Time   COLORURINE YELLOW 08/19/2017 0258   APPEARANCEUR CLEAR 08/19/2017 0258   LABSPEC 1.015 08/19/2017 0258   PHURINE 5.0 08/19/2017 0258   GLUCOSEU NEGATIVE 08/19/2017 0258   HGBUR NEGATIVE 08/19/2017 0258   BILIRUBINUR NEGATIVE 08/19/2017 0258   KETONESUR NEGATIVE 08/19/2017 0258   PROTEINUR 30 (A) 08/19/2017 0258   UROBILINOGEN 0.2 12/21/2014 1409   NITRITE NEGATIVE 08/19/2017 0258   LEUKOCYTESUR NEGATIVE 08/19/2017 0258   Sepsis Labs: '@LABRCNTIP' (procalcitonin:4,lacticidven:4)  ) Recent Results (from the past 240 hour(s))  Culture, blood (routine x 2)     Status: None (Preliminary result)   Collection Time: 08/18/17  2:30 AM   Result Value Ref Range Status   Specimen Description BLOOD RIGHT ANTECUBITAL  Final   Special Requests   Final    BOTTLES DRAWN AEROBIC AND ANAEROBIC Blood Culture adequate volume   Culture   Final    NO GROWTH 1 DAY Performed at [facility], [place] [address] [place], [place] [address]    Report Status PENDING  Incomplete  Culture, blood (routine x 2)     Status: None (Preliminary result)   Collection Time: 08/18/17  9:08 PM  Result Value Ref Range Status   Specimen Description BLOOD RIGHT ANTECUBITAL  Final   Special Requests   Final    BOTTLES DRAWN AEROBIC AND ANAEROBIC Blood Culture adequate volume   Culture   Final    NO GROWTH 2 DAYS Performed at [facility], [place] [address] [place], [place] [address]    Report Status PENDING  Incomplete  Urine Culture     Status: None   Collection Time: 08/19/17  2:58 AM  Result Value Ref Range Status   Specimen Description URINE, RANDOM  Final   Special Requests NONE  Final   Culture   Final    NO GROWTH Performed at [facility], [place] [address], [place], [place] [address]    Report Status 08/20/2017 FINAL  Final      Studies: [place] Ekg Site Rite  Result Date: 08/20/2017 If Site Rite image not attached, placement could not be confirmed due to current cardiac rhythm.   Scheduled Meds: . allopurinol  100 mg Oral Daily  . amLODipine  5 mg Oral Daily  . artificial tears  1 application Both Eyes QHS  . aspirin EC  81 mg Oral Daily  . divalproex  250 mg Oral Daily   And  . divalproex  500 mg Oral QHS  . docusate sodium  100 mg Oral Daily  . donepezil  10 mg Oral QHS  . enoxaparin (LOVENOX) injection  30 mg Subcutaneous Daily  . ferrous sulfate  325 mg Oral BID WC  . oxybutynin  5 mg Oral TID  . PARoxetine  20 mg Oral Daily  . pravastatin  40 mg Oral QHS  . traMADol  50 mg Oral Daily    Continuous Infusions: . sodium chloride 100 mL/hr at 08/20/17 0154  . piperacillin-tazobactam (ZOSYN)  IV Stopped  (08/20/17 0650)  .  sodium bicarbonate  infusion 1000 mL    . vancomycin 1,250 mg (08/20/17 1208)     LOS: 2 days     Annita Brod, MD [facility]  To reach me or the doctor on call, go to: [email or web address] Password [facility]  08/20/2017, 2:56 PM

## 2017-08-20 NOTE — Progress Notes (Signed)
Peripherally Inserted Central Catheter/Midline Placement  The IV Nurse has discussed with the patient and/or persons authorized to consent for the patient, the purpose of this procedure and the potential benefits and risks involved with this procedure.  The benefits include less needle sticks, lab draws from the catheter, and the patient may be discharged home with the catheter. Risks include, but not limited to, infection, bleeding, blood clot (thrombus formation), and puncture of an artery; nerve damage and irregular heartbeat and possibility to perform a PICC exchange if needed/ordered by physician.  Alternatives to this procedure were also discussed.  Bard Power PICC patient education guide, fact sheet on infection prevention and patient information card has been provided to patient /or left at bedside.    PICC/Midline Placement Documentation  PICC Single Lumen 23/30/07 PICC Right Basilic 46 cm 0 cm (Active)  Indication for Insertion or Continuance of Line Poor Vasculature-patient has had multiple peripheral attempts or PIVs lasting less than 24 hours 08/20/2017  6:00 PM  Exposed Catheter (cm) 0 cm 08/20/2017  6:00 PM  Site Assessment Clean;Dry;Intact 08/20/2017  6:00 PM  Line Status Flushed;Blood return noted;Saline locked 08/20/2017  6:00 PM  Dressing Type Transparent 08/20/2017  6:00 PM  Dressing Status Clean;Dry;Intact 08/20/2017  6:00 PM  Line Care Connections checked and tightened 08/20/2017  6:00 PM  Dressing Intervention New dressing 08/20/2017  6:00 PM  Dressing Change Due 08/27/17 08/20/2017  6:00 PM       Manuel King 08/20/2017, 6:31 PM

## 2017-08-20 NOTE — Progress Notes (Signed)
Patient's family would like patient to be in a regular patient bed. Nurse explained to family the fall protocol and the reason patient was placed in a high-low bed. Family verbalized understanding of education. State " We want daddy to be comfortable and this bed does not look comfortable". [place] bed ordered for patient. Patient is bed resting. No other complaints at this time. Will continue to monitor.

## 2017-08-20 NOTE — Progress Notes (Signed)
IV infiltrated on right wrist that had fluid and antibiotics running through them.   Other IV in R AC was misplace and would not flush- removed that IV as well.   Paged IV team for new IV access.   Paged MD to let them know about infiltration.    Applied cold pack to site;  Marked area with marker.

## 2017-08-20 NOTE — Progress Notes (Signed)
Physical Therapy Treatment Patient Details Name: Manuel King MRN: [id number] DOB: 09-27-1929 Today's Date: 08/20/2017    History of Present Illness Pt is an 84 y/o male admitted after fall at SNF. PMH includes dementia, prostate cancer, HTN, CVA.     PT Comments    Pt making slow progress with functional mobility. He was able to tolerate transfers with RW this session but required heavy assistance of two. Pt with no reports of pain throughout. Pt would continue to benefit from skilled physical therapy services at this time while admitted and after d/c to address the below listed limitations in order to improve overall safety and independence with functional mobility.   Follow Up Recommendations  SNF;Supervision/Assistance - 24 hour     Equipment Recommendations  None recommended by PT    Recommendations for Other Services       Precautions / Restrictions Precautions Precautions: Fall Restrictions Weight Bearing Restrictions: No    Mobility  Bed Mobility Overal bed mobility: Needs Assistance Bed Mobility: Supine to Sit;Sit to Supine     Supine to sit: Max assist;+2 for physical assistance Sit to supine: Max assist;+2 for physical assistance   General bed mobility comments: increased time and effort, use of bed rails, pt able to move bilateral LEs off of bed without assistance, heavy max A x2 to elevate trunk, assist with bilateral LEs and trunk to return to supine  Transfers Overall transfer level: Needs assistance Equipment used: Rolling walker (2 wheeled) Transfers: Sit to/from Stand Sit to Stand: Mod assist;+2 physical assistance         General transfer comment: increased time and effort, good technique, assist to power into standing and for stability with transition  Ambulation/Gait             General Gait Details: pt able to take 4 lateral steps at EOB with mod A x2 and RW; pt with heavy posterior lean   Stairs             Wheelchair  Mobility    Modified Rankin (Stroke Patients Only)       Balance Overall balance assessment: Needs assistance Sitting-balance support: Feet supported Sitting balance-Leahy Scale: Poor Sitting balance - Comments: pt with posterior lean, progressed from max A to close min guard with bilateral UE supports   Standing balance support: During functional activity;Bilateral upper extremity supported Standing balance-Leahy Scale: Poor                              Cognition Arousal/Alertness: Awake/alert Behavior During Therapy: WFL for tasks assessed/performed Overall Cognitive Status: History of cognitive impairments - at baseline Area of Impairment: Memory;Following commands;Safety/judgement;Problem solving                     Memory: Decreased short-term memory Following Commands: Follows one step commands consistently;Follows one step commands with increased time Safety/Judgement: Decreased awareness of deficits;Decreased awareness of safety   Problem Solving: Slow processing;Decreased initiation;Difficulty sequencing;Requires verbal cues General Comments: Dementia at baseline       Exercises      General Comments        Pertinent Vitals/Pain Pain Assessment: No/denies pain    Home Living                      Prior Function            PT Goals (current goals can now be found in  the care plan section) Acute Rehab PT Goals PT Goal Formulation: With family Time For Goal Achievement: 09/02/17 Potential to Achieve Goals: Fair Progress towards PT goals: Progressing toward goals    Frequency    Min 2X/week      PT Plan Current plan remains appropriate    Co-evaluation              AM-PAC PT "6 Clicks" Daily Activity  Outcome Measure  Difficulty turning over in bed (including adjusting bedclothes, sheets and blankets)?: Unable Difficulty moving from lying on back to sitting on the side of the bed? : Unable Difficulty  sitting down on and standing up from a chair with arms (e.g., wheelchair, bedside commode, etc,.)?: Unable Help needed moving to and from a bed to chair (including a wheelchair)?: A Lot Help needed walking in hospital room?: A Lot Help needed climbing 3-5 steps with a railing? : Total 6 Click Score: 8    End of Session Equipment Utilized During Treatment: Gait belt Activity Tolerance: Patient tolerated treatment well Patient left: in bed;with call bell/phone within reach;with bed alarm set;Other (comment)(RN present) Nurse Communication: Mobility status PT Visit Diagnosis: Unsteadiness on feet (R26.81);Muscle weakness (generalized) (M62.81);History of falling (Z91.81)     Time: 3009-2330 PT Time Calculation (min) (ACUTE ONLY): 24 min  Charges:  $Therapeutic Activity: 23-37 mins                    G Codes:       [place], [place], [place] 08/20/2017, 1:35 PM

## 2017-08-20 NOTE — Progress Notes (Signed)
Dear Doctor: Manuel King This patient has been identified as a candidate for PICC for the following reason (s): IV therapy over 48 hours, drug pH or osmolality (causing phlebitis, infiltration in 24 hours), poor veins/poor circulatory system (CHF, COPD, emphysema, diabetes, steroid use, IV drug abuse, etc.) and restarts due to phlebitis and infiltration in 24 hours If you agree, please write an order for the indicated device. For any questions contact the Vascular Access Team at [phone] if no answer, please leave a message.  Thank you for supporting the early vascular access assessment program.

## 2017-08-20 NOTE — Progress Notes (Signed)
CRITICAL VALUE ALERT  Critical Value:  Calcium 6.3  Date & Time Notied:  08/20/2017   Provider Notified:Karen Baltazar Najjar NP  Orders Received/Actions taken: Awaiting new orders.

## 2017-08-21 LAB — CBC
HEMATOCRIT: 27.7 % — AB (ref 39.0–52.0)
Hemoglobin: 9.2 g/dL — ABNORMAL LOW (ref 13.0–17.0)
MCH: 26.5 pg (ref 26.0–34.0)
MCHC: 33.2 g/dL (ref 30.0–36.0)
MCV: 79.8 fL (ref 78.0–100.0)
Platelets: 261 10*3/uL (ref 150–400)
RBC: 3.47 MIL/uL — ABNORMAL LOW (ref 4.22–5.81)
RDW: 16.5 % — AB (ref 11.5–15.5)
WBC: 14.5 10*3/uL — AB (ref 4.0–10.5)

## 2017-08-21 LAB — MRSA PCR SCREENING: MRSA BY PCR: NEGATIVE

## 2017-08-21 LAB — BASIC METABOLIC PANEL
Anion gap: 8 (ref 5–15)
BUN: 24 mg/dL — AB (ref 6–20)
CHLORIDE: 112 mmol/L — AB (ref 101–111)
CO2: 16 mmol/L — ABNORMAL LOW (ref 22–32)
Calcium: 7.9 mg/dL — ABNORMAL LOW (ref 8.9–10.3)
Creatinine, Ser: 1.51 mg/dL — ABNORMAL HIGH (ref 0.61–1.24)
GFR calc Af Amer: 47 mL/min — ABNORMAL LOW (ref >60)
GFR calc non Af Amer: 40 mL/min — ABNORMAL LOW (ref >60)
GLUCOSE: 112 mg/dL — AB (ref 65–99)
POTASSIUM: 3.6 mmol/L (ref 3.5–5.1)
SODIUM: 136 mmol/L (ref 135–145)

## 2017-08-21 LAB — PROCALCITONIN: Procalcitonin: 1.18 ng/mL

## 2017-08-21 MED ORDER — AMOXICILLIN-POT CLAVULANATE 875-125 MG PO TABS
1.0000 | ORAL_TABLET | Freq: Two times a day (BID) | ORAL | Status: DC
  Administered 2017-08-21 – 2017-08-24 (×7): 1 via ORAL
  Filled 2017-08-21 (×7): qty 1

## 2017-08-21 NOTE — Care Management Important Message (Signed)
Important Message  Patient Details  Name: Manuel King MRN: [id number] Date of Birth: July 21, 1929   Medicare Important Message Given:  Yes    Delio Slates 08/21/2017, 12:05 PM

## 2017-08-21 NOTE — Progress Notes (Signed)
Occupational Therapy Treatment Patient Details Name: Manuel King MRN: [id number] DOB: 01-10-1930 Today's Date: 08/21/2017    History of present illness Pt is an 84 y/o male admitted after fall at SNF. PMH includes dementia, prostate cancer, HTN, CVA.    OT comments  Pt progressing towards OT goals this session demonstrating increased seated balance and min guard for seated grooming tasks EOB, improved transfer ability (able to transfer to recliner with mod/min +2 assist). Pt continues to be sleepy, but awake and interactive during session - engaged with OT and shared that he enjoys playing cards. SNF level therapy continues to be necessary at discharge, and OT will continue to follow acutely. Next session to focus on transfers and continued sitting balance for ADL.    Follow Up Recommendations  SNF    Equipment Recommendations  None recommended by OT    Recommendations for Other Services      Precautions / Restrictions Precautions Precautions: Fall Restrictions Weight Bearing Restrictions: No       Mobility Bed Mobility Overal bed mobility: Needs Assistance Bed Mobility: Supine to Sit     Supine to sit: Min assist;HOB elevated;+2 for physical assistance     General bed mobility comments: increased time and effort, use of bed rails, Pt required vc for sequencing. assist for trunk elevation and use of bed pad to bring hips EOB  Transfers Overall transfer level: Needs assistance Equipment used: Rolling walker (2 wheeled) Transfers: Sit to/from [facility] Sit to Stand: Mod assist;+2 physical assistance;From elevated surface(from high bed) Stand pivot transfers: Min assist;+2 physical assistance;+2 safety/equipment       General transfer comment: increased time and effort, good technique, assist to power into standing and for stability with transition    Balance Overall balance assessment: Needs assistance Sitting-balance support: Feet  supported Sitting balance-Leahy Scale: Poor Sitting balance - Comments: With feet on the floor, and time, Pt was able to progress from min A to min guard sitting EOB   Standing balance support: During functional activity;Bilateral upper extremity supported Standing balance-Leahy Scale: Poor Standing balance comment: reliant on RW for support                           ADL either performed or assessed with clinical judgement   ADL Overall ADL's : Needs assistance/impaired     Grooming: Set up;Sitting;Wash/dry face Grooming Details (indicate cue type and reason): sitting EOB                 Toilet Transfer: Moderate assistance;+2 for physical assistance;+2 for safety/equipment;Stand-pivot;BSC;RW Toilet Transfer Details (indicate cue type and reason): simulated through recliner transfer. Pt mod A +2 for power up and then min A +2 for pivot   Toileting - Clothing Manipulation Details (indicate cue type and reason): condom catheter currently             Vision       Perception     Praxis      Cognition Arousal/Alertness: Awake/alert Behavior During Therapy: WFL for tasks assessed/performed Overall Cognitive Status: History of cognitive impairments - at baseline Area of Impairment: Memory;Following commands;Safety/judgement;Problem solving                     Memory: Decreased short-term memory Following Commands: Follows one step commands consistently;Follows one step commands with increased time Safety/Judgement: Decreased awareness of deficits;Decreased awareness of safety   Problem Solving: Slow processing;Decreased initiation;Difficulty sequencing;Requires verbal cues General  Comments: Dementia at baseline         Exercises     Shoulder Instructions       General Comments no famiy present at this time    Pertinent Vitals/ Pain       Pain Assessment: No/denies pain  Home Living                                           Prior Functioning/Environment              Frequency  Min 2X/week        Progress Toward Goals  OT Goals(current goals can now be found in the care plan section)  Progress towards OT goals: Progressing toward goals  Acute Rehab OT Goals Patient Stated Goal: For pt to return to [place] per family  OT Goal Formulation: Patient unable to participate in goal setting Time For Goal Achievement: 09/02/17 Potential to Achieve Goals: Good  Plan Discharge plan remains appropriate;Frequency remains appropriate    Co-evaluation                 AM-PAC PT "6 Clicks" Daily Activity     Outcome Measure   Help from another person eating meals?: A Little Help from another person taking care of personal grooming?: A Little Help from another person toileting, which includes using toliet, bedpan, or urinal?: A Lot Help from another person bathing (including washing, rinsing, drying)?: A Lot Help from another person to put on and taking off regular upper body clothing?: A Lot Help from another person to put on and taking off regular lower body clothing?: Total 6 Click Score: 13    End of Session Equipment Utilized During Treatment: Gait belt;Rolling walker  OT Visit Diagnosis: Muscle weakness (generalized) (M62.81)   Activity Tolerance Patient tolerated treatment well   Patient Left in chair;with call bell/phone within reach;with chair alarm set   Nurse Communication Mobility status;Other (comment)(IV infusion complete)        Time: 1740-8144 OT Time Calculation (min): 20 min  Charges: OT General Charges $OT Visit: 1 Visit OT Treatments $Self Care/Home Management : 8-22 mins  Hulda Humphrey OTR/L [place] 08/21/2017, 9:13 AM

## 2017-08-21 NOTE — Progress Notes (Signed)
PROGRESS NOTE    Manuel King  JKD:[id number] DOB: [birth year] DOA: 08/18/2017 PCP: Merrilee Seashore, MD   Brief Narrative:   84 year old male past medical history of dementia, hypertension, prostate CA, right lower extremity weakness secondary to CVA and stage III chronic kidney disease who had generalized weakness for the past few days and then fell while walking at his assisted living facility.  No reported loss of consciousness.  Patient was brought in and found to have a white count of 14, creatinine of 2.7 (1.6 2 years ago) and fever of 103.2.  Urinalysis and chest x-ray unrevealing.  Patient admitted and treatment with IV fluids and antibiotics started for sepsis of unknown etiology.  It appears that his source may be aspiration pneumonia, although this is not clear.  He has remained on vancomycin and Zosyn improvement in his sepsis and overall condition.  He has been started on a dysphagia 3 diet and appears to be able to take pills okay per nursing staff.  He is much more awake and alert with plans for return to skilled nursing facility on discharge.  Assessment & Plan:   Principal Problem:   Sepsis ([place]) Active Problems:   Essential hypertension   History of CVA (cerebrovascular accident)   Gout   Fall   HLD (hyperlipidemia)   Dementia   Iron deficiency anemia   Acute renal failure superimposed on stage 3 chronic kidney disease ([place])   1. Sepsis likely secondary to aspiration pneumonia.  Change antibiotic to oral Augmentin and discontinue IV vancomycin and Zosyn and continue to monitor leukocytosis.  If this is further improved and patient is afebrile, could be discharged on this for 3 more days to complete a 7-day course of treatment.  No significant findings on cultures noted. 2. Essential hypertension.  Blood pressures currently well controlled.  Continue current medications. 3. Fall-likely secondary to sepsis and subsequent weakness.  Continue with physical therapy and  fall precautions and discharge back to skilled nursing facility. 4. Dementia with behavioral disturbance.  There have been no disturbances noted overnight and this is likely secondary to sepsis.  Continue Aricept and Depakote. 5. Iron deficiency anemia.  Currently stable.  Follow with a.m. CBC.  No overt bleeding noted. 6. AK I on CKD stage III.  Appears to be back to baseline at this time.  Repeat BMP in a.m.  Continue bicarbonate infusion for 8 more hours and repeat labs in a.m. 7. History of CVA. 8. History of gout-no acute flare. 9. Dyslipidemia.   DVT prophylaxis: Lovenox Code Status: Full Family Communication: None at bedside Disposition Plan: Transition to oral Augmentin today and continue to address serum bicarbonate.  Anticipate discharge to skilled nursing facility in a.m. if this is well-tolerated.   Consultants:   None  Procedures:   None  Antimicrobials:   IV Zosyn and vancomycin 4/16-4/19  Augmentin 4/19-present   Subjective: Patient seen and evaluated today with no new acute complaints or concerns. No acute concerns or events noted overnight.  Patient has condom catheter with good urine output noted.  Objective: Vitals:   08/20/17 1019 08/21/17 0551 08/21/17 0658 08/21/17 0816  BP: (!) 177/84 (!) 179/76 (!) 147/75 (!) 159/81  Pulse:  (!) 101 78 77  Resp:  16 18   Temp:  99.5 F (37.5 C) 99.1 F (37.3 C)   TempSrc:  Oral Oral   SpO2:  97% 97% 98%  Weight:   100.5 kg (221 lb 9 oz)   Height:  Intake/Output Summary (Last 24 hours) at 08/21/2017 0950 Last data filed at 08/21/2017 0659 Gross per 24 hour  Intake 2750 ml  Output 2850 ml  Net -100 ml   Filed Weights   08/20/17 0013 08/21/17 0658  Weight: 101.2 kg (223 lb) 100.5 kg (221 lb 9 oz)    Examination:  General exam: Appears calm and comfortable  Respiratory system: Clear to auscultation. Respiratory effort normal. Cardiovascular system: S1 & S2 heard, RRR. No JVD, murmurs, rubs,  gallops or clicks. No pedal edema. Gastrointestinal system: Abdomen is nondistended, soft and nontender. No organomegaly or masses felt. Normal bowel sounds heard. Central nervous system: Alert and oriented. No focal neurological deficits. Extremities: Symmetric 5 x 5 power. Skin: No rashes, lesions or ulcers.  Condom catheter with clear, yellow urine output.    Data Reviewed: I have personally reviewed following labs and imaging studies  CBC: Recent Labs  Lab 08/18/17 2117 08/19/17 0445 08/20/17 0432 08/21/17 0359  WBC 14.0* 12.3* 13.0* 14.5*  NEUTROABS 11.5*  --   --   --   HGB 11.1* 10.0* 8.7* 9.2*  HCT 32.5* 30.8* 26.0* 27.7*  MCV 81.5 83.2 81.8 79.8  PLT 222 178 193 175   Basic Metabolic Panel: Recent Labs  Lab 08/18/17 2117 08/19/17 0445 08/20/17 0432 08/21/17 0359  NA 133* 133* 137 136  K 3.9 3.7 3.1* 3.6  CL 107 112* 116* 112*  CO2 13* 12* 11* 16*  GLUCOSE 125* 110* 86 112*  BUN 64* 58* 37* 24*  CREATININE 2.70* 2.41* 1.78* 1.51*  CALCIUM 8.3* 7.1* 6.3* 7.9*   GFR: Estimated Creatinine Clearance: 43.9 mL/min (A) (by C-G formula based on SCr of 1.51 mg/dL (H)). Liver Function Tests: Recent Labs  Lab 08/18/17 2117  AST 21  ALT 18  ALKPHOS 64  BILITOT 1.1  PROT 7.7  ALBUMIN 2.8*   No results for input(s): LIPASE, AMYLASE in the last 168 hours. No results for input(s): AMMONIA in the last 168 hours. Coagulation Profile: No results for input(s): INR, PROTIME in the last 168 hours. Cardiac Enzymes: No results for input(s): CKTOTAL, CKMB, CKMBINDEX, TROPONINI in the last 168 hours. BNP (last 3 results) No results for input(s): PROBNP in the last 8760 hours. HbA1C: No results for input(s): HGBA1C in the last 72 hours. CBG: No results for input(s): GLUCAP in the last 168 hours. Lipid Profile: No results for input(s): CHOL, HDL, LDLCALC, TRIG, CHOLHDL, LDLDIRECT in the last 72 hours. Thyroid Function Tests: No results for input(s): TSH, T4TOTAL,  FREET4, T3FREE, THYROIDAB in the last 72 hours. Anemia Panel: No results for input(s): VITAMINB12, FOLATE, FERRITIN, TIBC, IRON, RETICCTPCT in the last 72 hours. Sepsis Labs: Recent Labs  Lab 08/18/17 2114 08/19/17 0445 08/21/17 0359  PROCALCITON  --  1.28 1.18  LATICACIDVEN 1.14 1.4  --     Recent Results (from the past 240 hour(s))  Culture, blood (routine x 2)     Status: None (Preliminary result)   Collection Time: 08/18/17  2:30 AM  Result Value Ref Range Status   Specimen Description BLOOD RIGHT ANTECUBITAL  Final   Special Requests   Final    BOTTLES DRAWN AEROBIC AND ANAEROBIC Blood Culture adequate volume   Culture   Final    NO GROWTH 1 DAY Performed at [facility], [place] [address], [place], [place] [address]    Report Status PENDING  Incomplete  Culture, blood (routine x 2)     Status: None (Preliminary result)   Collection  Time: 08/18/17  9:08 PM  Result Value Ref Range Status   Specimen Description BLOOD RIGHT ANTECUBITAL  Final   Special Requests   Final    BOTTLES DRAWN AEROBIC AND ANAEROBIC Blood Culture adequate volume   Culture   Final    NO GROWTH 2 DAYS Performed at [facility], [address] [place], [place] [address]    Report Status PENDING  Incomplete  Urine Culture     Status: None   Collection Time: 08/19/17  2:58 AM  Result Value Ref Range Status   Specimen Description URINE, RANDOM  Final   Special Requests NONE  Final   Culture   Final    NO GROWTH Performed at [facility], [place] [address], [place], [place] [address]    Report Status 08/20/2017 FINAL  Final  MRSA PCR Screening     Status: None   Collection Time: 08/21/17  2:09 AM  Result Value Ref Range Status   MRSA by PCR NEGATIVE NEGATIVE Final    Comment:        The GeneXpert MRSA Assay (FDA approved for NASAL specimens only), is one component of a comprehensive MRSA colonization surveillance program. It is not intended to diagnose MRSA infection nor to  guide or monitor treatment for MRSA infections. Performed at [facility], [place] [address], [place], [place] [address]       Radiology Studies: [place] Ekg Site R[place]  Result Date: 08/20/2017 If [facility] image not attached, placement could not be confirmed due to current cardiac rhythm.    Scheduled Meds: . allopurinol  100 mg Oral Daily  . amLODipine  5 mg Oral Daily  . amoxicillin-clavulanate  1 tablet Oral Q12H  . artificial tears  1 application Both Eyes QHS  . aspirin EC  81 mg Oral Daily  . divalproex  250 mg Oral Daily   And  . divalproex  500 mg Oral QHS  . docusate sodium  100 mg Oral Daily  . donepezil  10 mg Oral QHS  . enoxaparin (LOVENOX) injection  30 mg Subcutaneous Daily  . ferrous sulfate  325 mg Oral BID WC  . hydrALAZINE  25 mg Oral Q8H  . oxybutynin  5 mg Oral TID  . PARoxetine  20 mg Oral Daily  . pravastatin  40 mg Oral QHS  . traMADol  50 mg Oral Daily   Continuous Infusions: . sodium chloride 100 mL/hr at 08/20/17 0154  .  sodium bicarbonate  infusion 1000 mL 50 mL/hr at 08/20/17 2346     LOS: 3 days    Time spent: 30 minutes    Mortimer Bair Darleen Crocker, DO [facility] Pager [phone]  If 7PM-7AM, please contact night-coverage [email or web address] Password [email or web address] 08/21/2017, 9:50 AM

## 2017-08-22 DIAGNOSIS — N183 Chronic kidney disease, stage 3 (moderate): Secondary | ICD-10-CM

## 2017-08-22 DIAGNOSIS — W19XXXA Unspecified fall, initial encounter: Secondary | ICD-10-CM

## 2017-08-22 DIAGNOSIS — D509 Iron deficiency anemia, unspecified: Secondary | ICD-10-CM

## 2017-08-22 DIAGNOSIS — E785 Hyperlipidemia, unspecified: Secondary | ICD-10-CM

## 2017-08-22 DIAGNOSIS — I10 Essential (primary) hypertension: Secondary | ICD-10-CM

## 2017-08-22 DIAGNOSIS — A419 Sepsis, unspecified organism: Principal | ICD-10-CM

## 2017-08-22 DIAGNOSIS — Z8673 Personal history of transient ischemic attack (TIA), and cerebral infarction without residual deficits: Secondary | ICD-10-CM

## 2017-08-22 DIAGNOSIS — F0390 Unspecified dementia without behavioral disturbance: Secondary | ICD-10-CM

## 2017-08-22 DIAGNOSIS — N179 Acute kidney failure, unspecified: Secondary | ICD-10-CM

## 2017-08-22 LAB — CBC
HEMATOCRIT: 29.2 % — AB (ref 39.0–52.0)
Hemoglobin: 9.9 g/dL — ABNORMAL LOW (ref 13.0–17.0)
MCH: 27 pg (ref 26.0–34.0)
MCHC: 33.9 g/dL (ref 30.0–36.0)
MCV: 79.8 fL (ref 78.0–100.0)
PLATELETS: 285 10*3/uL (ref 150–400)
RBC: 3.66 MIL/uL — ABNORMAL LOW (ref 4.22–5.81)
RDW: 16.4 % — AB (ref 11.5–15.5)
WBC: 13.7 10*3/uL — ABNORMAL HIGH (ref 4.0–10.5)

## 2017-08-22 LAB — BASIC METABOLIC PANEL
ANION GAP: 9 (ref 5–15)
BUN: 16 mg/dL (ref 6–20)
CALCIUM: 8 mg/dL — AB (ref 8.9–10.3)
CO2: 17 mmol/L — AB (ref 22–32)
CREATININE: 1.41 mg/dL — AB (ref 0.61–1.24)
Chloride: 107 mmol/L (ref 101–111)
GFR, EST AFRICAN AMERICAN: 51 mL/min — AB (ref >60)
GFR, EST NON AFRICAN AMERICAN: 44 mL/min — AB (ref >60)
Glucose, Bld: 150 mg/dL — ABNORMAL HIGH (ref 65–99)
Potassium: 3.5 mmol/L (ref 3.5–5.1)
SODIUM: 133 mmol/L — AB (ref 135–145)

## 2017-08-22 MED ORDER — ENOXAPARIN SODIUM 40 MG/0.4ML ~~LOC~~ SOLN
40.0000 mg | Freq: Every day | SUBCUTANEOUS | Status: DC
  Administered 2017-08-23 – 2017-08-24 (×2): 40 mg via SUBCUTANEOUS
  Filled 2017-08-22 (×2): qty 0.4

## 2017-08-22 NOTE — Clinical Social Work Note (Signed)
Clinical Social Work Assessment  Patient Details  Name: VALENTINO SAAVEDRA MRN: [id number] Date of Birth: 09-30-1929  Date of referral:  08/22/17               Reason for consult:  Discharge Planning, Facility Placement                Permission sought to share information with:  Family Supports, Customer service manager Permission granted to share information::  Yes, Verbal Permission Granted  Name::     Mardene Celeste [phone]), [name] [phone])  Agency::  [facility]  Relationship::  daughter  Contact Information:  place above  Housing/Transportation Living arrangements for the past 2 months:  Assisted Living Facility([facility]) Source of Information:  Adult Children Patient Interpreter Needed:  None Criminal Activity/Legal Involvement Pertinent to Current Situation/Hospitalization:  No - Comment as needed Significant Relationships:  Adult Children, Siblings, Other Family Members Lives with:  Facility Resident Do you feel safe going back to the place where you live?  Yes Need for family participation in patient care:  Yes (Comment)  Care giving concerns:  Patient has a history of dementia and is only orient x 2. Patient is from [facility]. Patient has a large family and they are very supportive of patients needs. Family is on board with patient discharging to rehab for short term stay.  Social Worker assessment / plan:  CSW spoke with patients daughter [name] via phone. [name] stated she agrees with patient going to SNF for rehab. [name] stated she would like CSW to send patient to [facility] since patient has been at the facility in the pat. CSW to reach out to [facility] with bed availability. CSW to follow up with family once bed offer is available.   Employment status:  Retired Nurse, adult PT Recommendations:  [place] / Referral to community resources:  [employer]  Patient/Family's Response to  care:  CSW spoke with patients other daughter Mardene Celeste and she agreed with [name] decision for [employer] placement   Patient/Family's Understanding of and Emotional Response to Diagnosis, Current Treatment, and Prognosis:  Family in agreement with patient going to rehab.  Emotional Assessment Appearance:  Appears stated age Attitude/Demeanor/Rapport:  Unable to Assess Affect (typically observed):  Unable to Assess Orientation:  Oriented to  Time, Oriented to Situation Alcohol / Substance use:  Not Applicable Psych involvement (Current and /or in the community):  No (Comment)  Discharge Needs  Concerns to be addressed:  No discharge needs identified Readmission within the last 30 days:  No Current discharge risk:  None Barriers to Discharge:  No SNF bed, [place], LCSW 08/22/2017, 12:58 PM

## 2017-08-22 NOTE — Progress Notes (Signed)
Paged physical therapy per social worker request in regards to SNF vs ALF  Awaiting call back

## 2017-08-22 NOTE — NC FL2 (Signed)
[place] LEVEL OF CARE SCREENING TOOL     IDENTIFICATION  Patient Name: Manuel King Birthdate: 06-Jul-1929 Sex: male Admission Date (Current Location): 08/18/2017  [facility] and [place] Number:  Herbalist and Address:  [facility] [place], [place] [address] [place], [place] [address]      Provider Number: [id number]  Attending Physician Name and Address:  Patrecia Pour, MD  Relative Name and Phone Number:  Adelfa Koh, [phone]    Current Level of Care: Hospital Recommended Level of Care: [place] Prior Approval Number:    Date Approved/Denied:   PASRR Number: [id number]  Discharge Plan: SNF    Current Diagnoses: Patient Active Problem List   Diagnosis Date Noted  . HLD (hyperlipidemia) 08/18/2017  . Dementia 08/18/2017  . Iron deficiency anemia 08/18/2017  . Acute renal failure superimposed on stage 3 chronic kidney disease ([place]) 08/18/2017  . Sepsis ([place]) 08/18/2017  . Urinary urgency 01/25/2015  . Ambulatory dysfunction 12/21/2014  . Generalized weakness 07/18/2014  . Fall 07/18/2014  . Malnutrition of moderate degree ([place]) 02/03/2014  . Bradycardia 02/01/2014  . Gout 09/16/2011  . AKI (acute kidney injury) ([place]) 09/12/2011  . Falls 09/12/2011  . Acute parotitis 05/10/2011  . History of CVA (cerebrovascular accident) 05/10/2011  . Vascular dementia 05/10/2011  . SLEEP APNEA 11/16/2008  . BRADYCARDIA 09/29/2008  . FATIGUE, CHRONIC 09/29/2008  . Essential hypertension 09/28/2008  . PROSTATE CANCER, HX OF 09/28/2008    Orientation RESPIRATION BLADDER Height & Weight     Self, Place  Normal Incontinent Weight: 218 lb 8 oz (99.1 kg) Height:  6\' 4"  (193 cm)  BEHAVIORAL SYMPTOMS/MOOD NEUROLOGICAL BOWEL NUTRITION STATUS      Incontinent Diet(dsy 3)  AMBULATORY STATUS COMMUNICATION OF NEEDS Skin   Extensive Assist Verbally                         Personal Care Assistance Level of  Assistance  Bathing, Feeding, Dressing Bathing Assistance: Maximum assistance Feeding assistance: Independent Dressing Assistance: Maximum assistance     Functional Limitations Info  Sight, Hearing, Speech Sight Info: Adequate Hearing Info: Adequate Speech Info: Adequate    SPECIAL CARE FACTORS FREQUENCY  PT (By licensed PT), OT (By licensed OT)     PT Frequency: 5x wk OT Frequency: 5x wk            Contractures Contractures Info: Not present    Additional Factors Info  Code Status, Allergies Code Status Info: Full Code Allergies Info: NKA           Current Medications (08/22/2017):  This is the current hospital active medication list Current Facility-Administered Medications  Medication Dose Route Frequency Provider Last Rate Last Dose  . 0.9 %  sodium chloride infusion   Intravenous Continuous Annita Brod, MD 100 mL/hr at 08/20/17 0154    . acetaminophen (TYLENOL) tablet 650 mg  650 mg Oral Q6H PRN Ivor Costa, MD   650 mg at 08/21/17 2134  . albuterol (PROVENTIL) (2.5 MG/3ML) 0.083% nebulizer solution 2.5 mg  2.5 mg Nebulization Q6H PRN Annita Brod, MD   2.5 mg at 08/19/17 1424  . allopurinol (ZYLOPRIM) tablet 100 mg  100 mg Oral Daily Ivor Costa, MD   100 mg at 08/22/17 0175  . amLODipine (NORVASC) tablet 5 mg  5 mg Oral Daily Ivor Costa, MD   5 mg at 08/22/17 0836  . amoxicillin-clavulanate (AUGMENTIN) 875-125 MG  per tablet 1 tablet  1 tablet Oral Q12H Shah, Pratik D, DO   1 tablet at 08/22/17 0836  . artificial tears (LACRILUBE) ophthalmic ointment 1 application  1 application Both Eyes QHS Ivor Costa, MD   1 application at 79/02/40 0119  . aspirin EC tablet 81 mg  81 mg Oral Daily Ivor Costa, MD   81 mg at 08/22/17 0839  . dextromethorphan-guaiFENesin (MUCINEX DM) 30-600 MG per 12 hr tablet 1 tablet  1 tablet Oral BID PRN Ivor Costa, MD   1 tablet at 08/21/17 2134  . divalproex (DEPAKOTE) DR tablet 250 mg  250 mg Oral Daily Ivor Costa, MD   250 mg at  08/22/17 9735   And  . divalproex (DEPAKOTE) DR tablet 500 mg  500 mg Oral QHS Ivor Costa, MD   500 mg at 08/21/17 2136  . docusate sodium (COLACE) capsule 100 mg  100 mg Oral Daily Ivor Costa, MD   100 mg at 08/22/17 0837  . donepezil (ARICEPT) tablet 10 mg  10 mg Oral QHS Ivor Costa, MD   10 mg at 08/21/17 2135  . [START ON 08/23/2017] enoxaparin (LOVENOX) injection 40 mg  40 mg Subcutaneous Daily Vance Gather B, MD      . ferrous sulfate tablet 325 mg  325 mg Oral BID WC Ivor Costa, MD   325 mg at 08/22/17 0836  . hydrALAZINE (APRESOLINE) injection 5 mg  5 mg Intravenous Q2H PRN Ivor Costa, MD      . hydrALAZINE (APRESOLINE) tablet 25 mg  25 mg Oral Q8H Annita Brod, MD   25 mg at 08/22/17 1410  . metoprolol tartrate (LOPRESSOR) injection 5 mg  5 mg Intravenous Q6H PRN Annita Brod, MD      . ondansetron [facility]) injection 4 mg  4 mg Intravenous Q8H PRN Ivor Costa, MD      . oxybutynin (DITROPAN) tablet 5 mg  5 mg Oral TID Ivor Costa, MD   5 mg at 08/22/17 0837  . PARoxetine (PAXIL) tablet 20 mg  20 mg Oral Daily Ivor Costa, MD   20 mg at 08/22/17 0837  . phenol (CHLORASEPTIC) mouth spray 1 spray  1 spray Mouth/Throat PRN Annita Brod, MD   1 spray at 08/22/17 0521  . polyethylene glycol (MIRALAX / GLYCOLAX) packet 17 g  17 g Oral Daily PRN Ivor Costa, MD      . polyvinyl alcohol (LIQUIFILM TEARS) 1.4 % ophthalmic solution 1 drop  1 drop Both Eyes PRN Ivor Costa, MD      . pravastatin (PRAVACHOL) tablet 40 mg  40 mg Oral QHS Ivor Costa, MD   40 mg at 08/21/17 2134  . sodium chloride flush (NS) 0.9 % injection 10-40 mL  10-40 mL Intracatheter PRN Annita Brod, MD   10 mL at 08/21/17 0405  . traMADol (ULTRAM) tablet 50 mg  50 mg Oral Daily Ivor Costa, MD   50 mg at 08/22/17 3299  . zolpidem (AMBIEN) tablet 5 mg  5 mg Oral QHS PRN Ivor Costa, MD   5 mg at 08/21/17 2137     Discharge Medications: Please see discharge summary for a list of discharge  medications.  Relevant Imaging Results:  Relevant Lab Results:   Additional Information SS# [id number]  Wende Neighbors, LCSW

## 2017-08-22 NOTE — Progress Notes (Signed)
  Speech Language Pathology Treatment: Dysphagia  Patient Details Name: Manuel King MRN: [id number] DOB: 11-03-1929 Today's Date: 08/22/2017 Time: 1710-1735 SLP Time Calculation (min) (ACUTE ONLY): 25 min  Assessment / Plan / Recommendation Clinical Impression  Pt seen for skilled ST treatment for dysphagia. SLP provided skilled observation and pt/caregiver education as pt consumed dinner meal of dys 3, thin liquids. Pt impulsive, taking large bites of tough bread. Prolonged mastication and right buccal pocketing, and as pt held bolus and took straw sip of thin liquid, there is immediate coughing. SLP encouraged pt to swallow all food prior to taking additional bites/sips. Occasional cues required, however no further signs of aspiration. Pt occasionally expectorated or manually removed tougher items. Educated pt's son re: overt signs of aspiration, swallow physiology and monitoring pt during meals to check for pocketing and cue for slow rate of intake. He verbalized understanding. Continue current diet of dys 3, thin liquids with precautions. Will continue to follow briefly during acute stay for tolerance and pt/caregiver education.    HPI HPI: 84 year old male past medical history of dementia, hypertension, prostate CA, right lower extremity weakness secondary to CVA and stage III chronic kidney disease who had generalized weakness for the past few days and then fell while walking at his assisted living facility. Urinalysis and chest x-ray unrevealing. Patient admitted and treatment with IV fluids and antibiotics started for sepsis of unknown etiology.  RN reports concern for aspiraiton given hard prolonged coughing with meds this am. 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       SLP Plan  Continue with current plan of care       Recommendations  Diet recommendations: Dysphagia 3 (mechanical soft);Thin liquid Liquids provided via:  Cup;Straw Medication Administration: Whole meds with liquid(RN reports pt tolerating with liquid) Supervision: Intermittent supervision to cue for compensatory strategies Compensations: Slow rate;Small sips/bites;Lingual sweep for clearance of pocketing Postural Changes and/or Swallow Maneuvers: Seated upright 90 degrees                Oral Care Recommendations: Oral care BID Follow up Recommendations: Skilled Nursing facility SLP Visit Diagnosis: Dysphagia, unspecified (R13.10) Plan: Continue with current plan of care       [place], [place], [place] Pathologist [place] 08/22/2017, 5:51 PM

## 2017-08-22 NOTE — Progress Notes (Signed)
PROGRESS NOTE  Manuel King  PYK:[id number] DOB: May 17, 1929 DOA: 08/18/2017 PCP: Merrilee Seashore, MD   Brief Narrative: Manuel King is an 84 year old male with a history of dementia, hypertension, prostate CA, right lower extremity weakness secondary to CVA and stage III chronic kidney disease who had generalized weakness for the past few days and then fell while walking at his assisted living facility. No reported loss of consciousness. Patient was brought in and found to have a white count of 14, creatinine of 2.7 (1.6 2 years ago) and fever of 103.86F. Urinalysis and chest x-ray unrevealing. Patient admitted and treatment with IV fluids and antibiotics started for sepsis of unknown etiology.  It appears that his source may be aspiration pneumonia, although this is not clear.  He had remained on vancomycin and zosyn with improvement in his sepsis and overall condition.  He has been started on a dysphagia 3 diet and appears to be able to take pills okay per nursing staff.  He is much more awake and alert with plans for rehabilitation at skilled nursing facility prior to return to ALF on discharge.   Assessment & Plan: Principal Problem:   Sepsis ([place]) Active Problems:   Essential hypertension   History of CVA (cerebrovascular accident)   Gout   Fall   HLD (hyperlipidemia)   Dementia   Iron deficiency anemia   Acute renal failure superimposed on stage 3 chronic kidney disease (HCC)  Sepsis likely secondary to aspiration pneumonia: Possibly had aspiration event with CXR showing no infiltrate early in the course. Sepsis resolved, now completing treatment. Leukocytosis has improved. - Continue augmentin to complete 7 day total course (4/16 - 4/22).  - Monitor culture data (NGTD, urine neg, no sputum Cx sent)  Essential hypertension:  - Restart all home medications  Fall: likely secondary to sepsis and subsequent weakness.   - Continue with physical therapy and fall  precautions.  - Due to debility, will require DC to SNF.  Dementia with behavioral disturbance:  - Continue aricept, depakote, paxilepakote. - Delirium precautions  Iron deficiency anemia.  Currently stable with no overt bleeding noted. - Continue supplemental iron - No indication for repeat CBC  AKI on CKD stage III: Resolved  Appears to be back to baseline at this time.  Repeat BMP in a.m.  Continue bicarbonate infusion for 8 more hours and repeat labs in a.m.  NAGMA: Due to renal insufficiency as above.  - Has been on bicarbonate gtt with improvement  History of CVA: - Continue ASA, statin  History of gout: No acute flare. - Continue allopurinol  Dyslipidemia:  - Continue statin  DVT prophylaxis: Lovenox Code Status: Full Family Communication: None at bedside, daughter updated by phone Disposition Plan: SNF when insurance authorization is available (closed for weekend)  Consultants:   None  Procedures:   RUE SL PICC 4/18  Antimicrobials:  Vanc/zosyn 4/16 - 4/19  Augmentin 4/19 - 4/22   Subjective: Pt is confused but pleasant and conversant. Denies trouble breathing, chest pain, cough, fever, abd pain, N/V/D, or bleeding. RN reports stable overnight.   Objective: BP (!) 176/81 (BP Location: Left Arm)   Pulse 86   Temp 98.2 F (36.8 C) (Oral)   Resp 18   Ht 6\' 4"  (1.93 m)   Wt 99.1 kg (218 lb 8 oz)   SpO2 97%   BMI 26.60 kg/m   Gen: Elderly male in no distress Pulm: Non-labored breathing room air. Clear to auscultation bilaterally.  CV: Regular rate  and rhythm. No murmur, rub, or gallop. No JVD, no pedal edema. GI: Abdomen soft, non-tender, non-distended, with normoactive bowel sounds. No organomegaly or masses felt. Ext: Warm, no deformities. RUE PICC c/d/i GU: +Condom catheter Skin: No rashes, lesions or ulcers Neuro: Alert, confused. Gait not assessed due to generalized weakness worse on right which is stable. Psych: Judgement and insight appear  impaired by cognitive deficit. Mood & affect appropriate.    LOS: 4 days   Time spent: 25 minutes.  Vance Gather, MD [facility] Pager [phone]  If 7PM-7AM, please contact night-coverage [email or web address] Password [email or web address] 08/22/2017, 1:19 PM

## 2017-08-22 NOTE — Progress Notes (Signed)
Pt has history of dementia, no family at bedside, unable to complete admission database at this time

## 2017-08-22 NOTE — Progress Notes (Signed)
Pt daughter Abbott Pao called, update provided    Physical therapist returned call and provided him with social worker Caryl Pina number [phone]

## 2017-08-23 LAB — CULTURE, BLOOD (ROUTINE X 2)
Culture: NO GROWTH
SPECIAL REQUESTS: ADEQUATE

## 2017-08-23 NOTE — Progress Notes (Signed)
PROGRESS NOTE  Manuel King  QPR:[id number] DOB: July 29, 1929 DOA: 08/18/2017 PCP: Merrilee Seashore, MD   Brief Narrative: Manuel King is an 84 year old male with a history of dementia, hypertension, prostate CA, right lower extremity weakness secondary to CVA and stage III chronic kidney disease who had generalized weakness for the past few days and then fell while walking at his assisted living facility. No reported loss of consciousness. Patient was brought in and found to have a white count of 14, creatinine of 2.7 (1.6 2 years ago) and fever of 103.34F. Urinalysis and chest x-ray unrevealing. Patient admitted and treatment with IV fluids and antibiotics started for sepsis of unknown etiology.  It appears that his source may be aspiration pneumonia, although this is not clear.  He had remained on vancomycin and zosyn with improvement in his sepsis and overall condition.  He has been started on a dysphagia 3 diet and appears to be able to take pills okay per nursing staff.  He is much more awake and alert with plans for rehabilitation at skilled nursing facility prior to return to ALF on discharge.   Assessment & Plan: Principal Problem:   Sepsis ([place]) Active Problems:   Essential hypertension   History of CVA (cerebrovascular accident)   Gout   Fall   HLD (hyperlipidemia)   Dementia   Iron deficiency anemia   Acute renal failure superimposed on stage 3 chronic kidney disease (HCC)  Sepsis likely secondary to aspiration pneumonia: Possibly had aspiration event with CXR showing no infiltrate early in the course. Sepsis resolved, now completing treatment. Leukocytosis has improved. - Continue augmentin to complete 7 day total course (4/16 - 4/22).  - Continue dysphagia 3 diet, would benefit from ongoing SLP at SNF.  - Monitor culture data (NGTD, urine neg, no sputum Cx sent)  Essential hypertension:  - Restarted home medications: norvasc, hydralazine, will restart HCTZ and ARB  (held due to AKI) in the next 24 hrs.   Fall: likely secondary to sepsis and subsequent weakness.   - Continue with physical therapy and fall precautions.  - Due to debility, will require DC to SNF.  Dementia with behavioral disturbance:  - Continue aricept, depakote, paxil  - Delirium precautions  Iron deficiency anemia.  Currently stable with no overt bleeding noted. - Continue supplemental iron - No indication for repeat CBC  AKI on CKD stage III: Resolved  Appears to be back to baseline at this time.   - Repeat BMP in next week  NAGMA: Due to renal insufficiency as above.  - Was on bicarbonate gtt with improvement  History of CVA: - Continue ASA, statin  History of gout: No acute flare. - Continue allopurinol  Dyslipidemia:  - Continue statin  DVT prophylaxis: Lovenox Code Status: Full Family Communication: None at bedside  Disposition Plan: SNF when insurance authorization is available, anticipate 4/22  Consultants:   None  Procedures:   RUE SL PICC 4/18  Antimicrobials:  Vanc/zosyn 4/16 - 4/19  Augmentin 4/19 - 4/22   Subjective: Pt is confused but denies any pain, dyspnea, N/V/D, or fever. No other complaints.   Objective: BP (!) 145/72 (BP Location: Left Arm)   Pulse 84   Temp 99.6 F (37.6 C) (Oral)   Resp (!) 22   Ht 6\' 4"  (1.93 m)   Wt 100.3 kg (221 lb 1.6 oz)   SpO2 98%   BMI 26.91 kg/m   Gen: Elderly male in no distress Pulm: Non-labored breathing room air. Clear  to auscultation bilaterally. Does cough intermittently while having just eaten breakfast.  CV: Regular rate and rhythm. No murmur, rub, or gallop. No JVD, no pedal edema. GI: Abdomen soft, non-tender, non-distended, with normoactive bowel sounds. No organomegaly or masses felt. Ext: Warm, no deformities. RUE PICC c/d/i GU: +Condom catheter Skin: No rashes, lesions or ulcers Neuro: Alert, confused. Gait not assessed due to generalized weakness worse on right which is  stable. Psych: Judgement and insight appear impaired by cognitive deficit. Mood & affect appropriate.    LOS: 5 days   Time spent: 25 minutes.  Vance Gather, MD [facility] Pager [phone]  If 7PM-7AM, please contact night-coverage [email or web address] Password [email or web address] 08/23/2017, 1:32 PM

## 2017-08-24 DIAGNOSIS — N179 Acute kidney failure, unspecified: Secondary | ICD-10-CM | POA: Diagnosis not present

## 2017-08-24 DIAGNOSIS — R498 Other voice and resonance disorders: Secondary | ICD-10-CM | POA: Diagnosis not present

## 2017-08-24 DIAGNOSIS — Z9181 History of falling: Secondary | ICD-10-CM | POA: Diagnosis not present

## 2017-08-24 DIAGNOSIS — R6520 Severe sepsis without septic shock: Secondary | ICD-10-CM | POA: Diagnosis not present

## 2017-08-24 DIAGNOSIS — I129 Hypertensive chronic kidney disease with stage 1 through stage 4 chronic kidney disease, or unspecified chronic kidney disease: Secondary | ICD-10-CM | POA: Diagnosis not present

## 2017-08-24 DIAGNOSIS — D649 Anemia, unspecified: Secondary | ICD-10-CM | POA: Diagnosis not present

## 2017-08-24 DIAGNOSIS — W19XXXA Unspecified fall, initial encounter: Secondary | ICD-10-CM | POA: Diagnosis not present

## 2017-08-24 DIAGNOSIS — A419 Sepsis, unspecified organism: Secondary | ICD-10-CM | POA: Diagnosis not present

## 2017-08-24 DIAGNOSIS — R05 Cough: Secondary | ICD-10-CM | POA: Diagnosis not present

## 2017-08-24 DIAGNOSIS — R278 Other lack of coordination: Secondary | ICD-10-CM | POA: Diagnosis not present

## 2017-08-24 DIAGNOSIS — M6281 Muscle weakness (generalized): Secondary | ICD-10-CM | POA: Diagnosis not present

## 2017-08-24 DIAGNOSIS — R402441 Other coma, without documented Glasgow coma scale score, or with partial score reported, in the field [EMT or ambulance]: Secondary | ICD-10-CM | POA: Diagnosis not present

## 2017-08-24 DIAGNOSIS — Z87891 Personal history of nicotine dependence: Secondary | ICD-10-CM | POA: Diagnosis not present

## 2017-08-24 DIAGNOSIS — J690 Pneumonitis due to inhalation of food and vomit: Secondary | ICD-10-CM | POA: Diagnosis not present

## 2017-08-24 DIAGNOSIS — Z8546 Personal history of malignant neoplasm of prostate: Secondary | ICD-10-CM | POA: Diagnosis not present

## 2017-08-24 DIAGNOSIS — R042 Hemoptysis: Secondary | ICD-10-CM | POA: Diagnosis not present

## 2017-08-24 DIAGNOSIS — N183 Chronic kidney disease, stage 3 (moderate): Secondary | ICD-10-CM | POA: Diagnosis not present

## 2017-08-24 DIAGNOSIS — R29898 Other symptoms and signs involving the musculoskeletal system: Secondary | ICD-10-CM | POA: Diagnosis not present

## 2017-08-24 DIAGNOSIS — Z8673 Personal history of transient ischemic attack (TIA), and cerebral infarction without residual deficits: Secondary | ICD-10-CM | POA: Diagnosis not present

## 2017-08-24 DIAGNOSIS — R2689 Other abnormalities of gait and mobility: Secondary | ICD-10-CM | POA: Diagnosis not present

## 2017-08-24 DIAGNOSIS — F0390 Unspecified dementia without behavioral disturbance: Secondary | ICD-10-CM | POA: Diagnosis not present

## 2017-08-24 DIAGNOSIS — R2681 Unsteadiness on feet: Secondary | ICD-10-CM | POA: Diagnosis not present

## 2017-08-24 DIAGNOSIS — I10 Essential (primary) hypertension: Secondary | ICD-10-CM | POA: Diagnosis not present

## 2017-08-24 DIAGNOSIS — R040 Epistaxis: Secondary | ICD-10-CM | POA: Diagnosis not present

## 2017-08-24 DIAGNOSIS — Z79899 Other long term (current) drug therapy: Secondary | ICD-10-CM | POA: Diagnosis not present

## 2017-08-24 DIAGNOSIS — J189 Pneumonia, unspecified organism: Secondary | ICD-10-CM | POA: Diagnosis not present

## 2017-08-24 LAB — CULTURE, BLOOD (ROUTINE X 2)
Culture: NO GROWTH
Special Requests: ADEQUATE

## 2017-08-24 MED ORDER — AMOXICILLIN-POT CLAVULANATE 875-125 MG PO TABS: 1.0000 | ORAL_TABLET | Freq: Two times a day (BID) | ORAL | 0 refills | Status: DC

## 2017-08-24 NOTE — Progress Notes (Signed)
CSW spoke to [facility] admissions and confirmed patient's bed at the facility. Facility to start [facility] authorization and should have determination today. CSW to support with discharge.  Manuel King, [place]

## 2017-08-24 NOTE — Care Management Important Message (Signed)
Important Message  Patient Details  Name: Manuel King MRN: [id number] Date of Birth: [birth year]   Medicare Important Message Given:  Yes    Marcelle Bebout P Phoua Hoadley 08/24/2017, 3:47 PM

## 2017-08-24 NOTE — Progress Notes (Addendum)
CSW met with patient, daughter [name], and son at bedside and updated them that bed available at [facility] today. Patient and family in agreement for patient to discharge to facility.  [place] has [facility] authorization for patient to admit to facility today.  Patient will discharge to [facility] Anticipated discharge date: 08/24/17 Family notified: Adelfa Koh, daughter (at bedside) and also left message for daughter, Bettina Gavia by: Corey Harold  Nurse to call report to [phone]. Patient will go to room 703P at the facility.    CSW signing off.  Estanislado Emms, [place]  Clinical Social Worker

## 2017-08-24 NOTE — Progress Notes (Signed)
RN attempted to called report to nurse receiving patient at [facility] place, no response, Rn called back and left number with receptionist for nurse to call.

## 2017-08-24 NOTE — Care Management Note (Signed)
Case Management Note  Patient Details  Name: Manuel King MRN: [id number] Date of Birth: October 29, 1929  Subjective/Objective:    Admitted for Sepsis.            Action/Plan:  When patient medically stable/bed available will discharge to SNF per CSW arrangements.  NCM will continue to follow how patient progresses.  Expected Discharge Date:  08/24/17               Expected Discharge Plan:  Skilled Nursing Facility  In-House Referral:  Clinical Social Work  Discharge planning Services  CM Consult  Status of Service:  In process, will continue to follow  Kristen Cardinal, RN 08/24/2017, 11:08 AM

## 2017-08-24 NOTE — Discharge Summary (Signed)
Physician Discharge Summary  Manuel King JKD:[id number] DOB: Jul 14, 1929 DOA: 08/18/2017  PCP: Merrilee Seashore, MD  Admit date: 08/18/2017 Discharge date: 08/24/2017  Admitted From: ALF Disposition: SNF   Recommendations for Outpatient Follow-up:  1. Follow up with PCP in 1-2 weeks. Need to discuss ASA for hx CVA.  2. Please obtain BMP this week to monitor renal function/bicarbonate.  3. Continue SLP, PT, OT at [facility]: N/A Equipment/Devices: Per SNF Discharge Condition: Stable CODE STATUS: Full Diet recommendation: Dysphagia 3  Brief/Interim Summary: Manuel King is an 84 year old male with a history of dementia, hypertension, prostate CA, right lower extremity weakness secondary to CVA and stage III chronic kidney disease who had generalized weakness for the past few days and then fell while walking at his assisted living facility. No reported loss of consciousness. Patient was brought in and found to have a white count of 14, creatinine of 2.7 (1.6 2 years ago) and fever of 103.73F. Urinalysis and chest x-ray unrevealing. Patient admitted and treatment with IV fluids and antibiotics started for sepsis of unknown etiology.  It appears that his source may be aspiration pneumonia as he has displayed some dysphagia on SLP evaluation. He had remained on vancomycin and zosyn with improvement in his sepsis and overall condition. Antibiotics were transitioned to augmentin which will be completed on 4/22.He has been started on a dysphagia 3 diet and appears to be able to take pills okay per nursing staff. He is much more awake and alert with plans for rehabilitation at skilled nursing facility prior to return to ALF on discharge.   Discharge Diagnoses:  Principal Problem:   Sepsis ([place]) Active Problems:   Essential hypertension   History of CVA (cerebrovascular accident)   Gout   Fall   HLD (hyperlipidemia)   Dementia   Iron deficiency anemia   Acute renal failure  superimposed on stage 3 chronic kidney disease (HCC)  Sepsis likely secondary to aspiration pneumonia: Possibly had aspiration event with CXR showing no infiltrate early in the course.Sepsis resolved, now completing treatment. Leukocytosis has improved. - Continue augmentin to complete 7 day total course (4/16 - 4/22).  - Continue dysphagia 3 diet, would benefit from ongoing SLP at SNF.  - Monitor culture data (NGTD, urine neg, no sputum Cx sent)  Essential hypertension:  - Restarted home medications: norvasc, hydralazine, ok to restart HCTZ and ARB (held due to AKI)  Fall: likely secondary to sepsis and subsequent weakness.  - Continue with physical therapy and fall precautions.  - Due to debility, will require DC to SNF.  Dementia with behavioral disturbance: Stable. - Continue aricept, depakote, paxil  - Delirium precautions  Iron deficiency anemia. Currently stable with no overt bleeding noted. - Continue supplemental iron - No indication for repeat CBC at this time  AKI on CKD stage III: Resolved Appears to be back to baseline at this time.  - Repeat BMP in next week  NAGMA: Due to renal insufficiency as above.  - Was on bicarbonate gtt with improvement, now taking better po.   History of CVA: - Continue statin. Not on aspirin for unclear reasons. Will need PCP follow up to discuss this.  History of gout: No acute flare. - Continue allopurinol  Dyslipidemia:  - Continue statin  Discharge Instructions Discharge Instructions    Diet - low sodium heart healthy   Complete by:  As directed      Allergies as of 08/24/2017   No Known Allergies  Medication List    TAKE these medications   acetaminophen 500 MG tablet Commonly known as:  TYLENOL Take 1,000 mg by mouth every 6 (six) hours as needed.   acetaminophen 500 MG tablet Commonly known as:  TYLENOL Take 1,000 mg by mouth 2 (two) times daily.   allopurinol 100 MG tablet Commonly known as:   ZYLOPRIM Take 100 mg by mouth daily.   amLODipine 5 MG tablet Commonly known as:  NORVASC Take 5 mg by mouth daily.   amoxicillin-clavulanate 875-125 MG tablet Commonly known as:  AUGMENTIN Take 1 tablet by mouth every 12 (twelve) hours.   divalproex 250 MG DR tablet Commonly known as:  DEPAKOTE Take 250-500 mg by mouth 2 (two) times daily. Take 250 mg  tablet in the am and 500 mg  tablets in the pm   docusate sodium 100 MG capsule Commonly known as:  COLACE Take 100 mg by mouth daily.   donepezil 10 MG tablet Commonly known as:  ARICEPT Take 10 mg by mouth at bedtime.   erythromycin ophthalmic ointment Place a 1/2 inch ribbon of ointment into the lower eyelid.   ferrous sulfate 325 (65 FE) MG EC tablet Take 325 mg by mouth 2 (two) times daily.   hydrALAZINE 25 MG tablet Commonly known as:  APRESOLINE Take 1 tablet (25 mg total) by mouth every 8 (eight) hours.   hydrochlorothiazide 12.5 MG tablet Commonly known as:  HYDRODIURIL Take 12.5 mg by mouth daily.   irbesartan 150 MG tablet Commonly known as:  AVAPRO Take 150 mg by mouth daily.   OPTIVE 0.5-0.9 % ophthalmic solution Generic drug:  carboxymethylcellul-glycerin Place 1 drop into both eyes as needed for dry eyes.   oxybutynin 5 MG tablet Commonly known as:  DITROPAN Take 5 mg by mouth 3 (three) times daily.   PARoxetine 20 MG tablet Commonly known as:  PAXIL Take 20 mg by mouth daily.   pravastatin 40 MG tablet Commonly known as:  PRAVACHOL Take 40 mg by mouth at bedtime.   SYSTANE NIGHTTIME Oint Place 1 application into both eyes at bedtime.   traMADol 50 MG tablet Commonly known as:  ULTRAM Take 50 mg by mouth daily.      Follow-up Information    Merrilee Seashore, MD. Schedule an appointment as soon as possible for a visit in 2 week(s).   Specialty:  Internal Medicine Contact information: [address] [place] [address] [phone]          No Known  Allergies  Consultations:  None  Procedures/Studies: Dg Chest 2 View  Result Date: 08/18/2017 CLINICAL DATA:  Status post fall, with concern for chest injury. Fever. Initial encounter. EXAM: CHEST - 2 VIEW COMPARISON:  Chest radiograph performed 02/02/2014 FINDINGS: The lungs are well-aerated and clear. There is no evidence of focal opacification, pleural effusion or pneumothorax. The heart is normal in size; the mediastinal contour is within normal limits. No acute osseous abnormalities are seen. IMPRESSION: No acute cardiopulmonary process seen. No displaced rib fracture seen. Electronically Signed   By: Garald Balding M.D.   On: 08/18/2017 21:58   Ct Head Wo Contrast  Result Date: 08/18/2017 CLINICAL DATA:  Fall EXAM: CT HEAD WITHOUT CONTRAST CT CERVICAL SPINE WITHOUT CONTRAST TECHNIQUE: Multidetector CT imaging of the head and cervical spine was performed following the standard protocol without intravenous contrast. Multiplanar CT image reconstructions of the cervical spine were also generated. COMPARISON:  Head CT 09/29/2014, CT neck 05/10/2011 FINDINGS: CT HEAD FINDINGS  Brain: No acute territorial infarction, hemorrhage or intracranial mass is seen. Moderate atrophy and small vessel ischemic changes of the white matter. Old lacunar infarcts in the right basal ganglia. Stable ventricle size Vascular: No hyperdense vessels.  Carotid vascular calcification Skull: Normal. Negative for fracture or focal lesion. Sinuses/Orbits: Mild mucosal thickening in the ethmoid sinuses. No acute orbital abnormality. Other: None CT CERVICAL SPINE FINDINGS Alignment: Reversal of cervical lordosis. Facet alignment within normal limits. Skull base and vertebrae: No acute fracture. No primary bone lesion or focal pathologic process. Soft tissues and spinal canal: No prevertebral fluid or swelling. No visible canal hematoma. Disc levels: Anterior plate and screw fixation at C3-C4 with bone fusion present. Marked  degenerative changes at C4-C5, C5-C6, C6-C7 and C7-T1 with moderate degenerative changes at C2-C3. Multiple level bilateral foraminal stenosis. Upper chest: Scarring and emphysema at the apices. Other: None IMPRESSION: 1. No CT evidence for acute intracranial abnormality. Atrophy and small vessel ischemic changes of the white matter. 2. Reversal of cervical lordosis with postsurgical changes at C3-C4 and multiple level moderate severe degenerative changes. No definite acute osseous abnormality. Electronically Signed   By: Donavan Foil M.D.   On: 08/18/2017 22:29   Ct Cervical Spine Wo Contrast  Result Date: 08/18/2017 CLINICAL DATA:  Fall EXAM: CT HEAD WITHOUT CONTRAST CT CERVICAL SPINE WITHOUT CONTRAST TECHNIQUE: Multidetector CT imaging of the head and cervical spine was performed following the standard protocol without intravenous contrast. Multiplanar CT image reconstructions of the cervical spine were also generated. COMPARISON:  Head CT 09/29/2014, CT neck 05/10/2011 FINDINGS: CT HEAD FINDINGS Brain: No acute territorial infarction, hemorrhage or intracranial mass is seen. Moderate atrophy and small vessel ischemic changes of the white matter. Old lacunar infarcts in the right basal ganglia. Stable ventricle size Vascular: No hyperdense vessels.  Carotid vascular calcification Skull: Normal. Negative for fracture or focal lesion. Sinuses/Orbits: Mild mucosal thickening in the ethmoid sinuses. No acute orbital abnormality. Other: None CT CERVICAL SPINE FINDINGS Alignment: Reversal of cervical lordosis. Facet alignment within normal limits. Skull base and vertebrae: No acute fracture. No primary bone lesion or focal pathologic process. Soft tissues and spinal canal: No prevertebral fluid or swelling. No visible canal hematoma. Disc levels: Anterior plate and screw fixation at C3-C4 with bone fusion present. Marked degenerative changes at C4-C5, C5-C6, C6-C7 and C7-T1 with moderate degenerative changes at  C2-C3. Multiple level bilateral foraminal stenosis. Upper chest: Scarring and emphysema at the apices. Other: None IMPRESSION: 1. No CT evidence for acute intracranial abnormality. Atrophy and small vessel ischemic changes of the white matter. 2. Reversal of cervical lordosis with postsurgical changes at C3-C4 and multiple level moderate severe degenerative changes. No definite acute osseous abnormality. Electronically Signed   By: Donavan Foil M.D.   On: 08/18/2017 22:29   [place] Ekg Site Rite  Result Date: 08/20/2017 If Site Rite image not attached, placement could not be confirmed due to current cardiac rhythm.    Subjective: No events overnight or over the weekend. Cough has improved, denies dyspnea, chest pain. No fevers. Eating well.   Discharge Exam: Vitals:   08/23/17 2223 08/24/17 0456  BP: (!) 170/71 136/61  Pulse: 71 (!) 56  Resp: 18 18  Temp: 99.6 F (37.6 C) 98.6 F (37 C)  SpO2: 97% 99%   General: Pleasant, elderly male in no distress Cardiovascular: RRR, S1/S2 +, no rubs, no gallops Respiratory: CTA bilaterally, no wheezing, no rhonchi Abdominal: Soft, NT, ND, bowel sounds + Neuro: Alert, confused. Gait not  assessed due to generalized weakness worse on right which is stable.  Labs: BNP (last 3 results) Recent Labs    08/18/17 2117  BNP 40.9   Basic Metabolic Panel: Recent Labs  Lab 08/18/17 2117 08/19/17 0445 08/20/17 0432 08/21/17 0359 08/22/17 0308  NA 133* 133* 137 136 133*  K 3.9 3.7 3.1* 3.6 3.5  CL 107 112* 116* 112* 107  CO2 13* 12* 11* 16* 17*  GLUCOSE 125* 110* 86 112* 150*  BUN 64* 58* 37* 24* 16  CREATININE 2.70* 2.41* 1.78* 1.51* 1.41*  CALCIUM 8.3* 7.1* 6.3* 7.9* 8.0*   Liver Function Tests: Recent Labs  Lab 08/18/17 2117  AST 21  ALT 18  ALKPHOS 64  BILITOT 1.1  PROT 7.7  ALBUMIN 2.8*   No results for input(s): LIPASE, AMYLASE in the last 168 hours. No results for input(s): AMMONIA in the last 168 hours. CBC: Recent Labs   Lab 08/18/17 2117 08/19/17 0445 08/20/17 0432 08/21/17 0359 08/22/17 0308  WBC 14.0* 12.3* 13.0* 14.5* 13.7*  NEUTROABS 11.5*  --   --   --   --   HGB 11.1* 10.0* 8.7* 9.2* 9.9*  HCT 32.5* 30.8* 26.0* 27.7* 29.2*  MCV 81.5 83.2 81.8 79.8 79.8  PLT 222 178 193 261 285   Cardiac Enzymes: No results for input(s): CKTOTAL, CKMB, CKMBINDEX, TROPONINI in the last 168 hours. BNP: Invalid input(s): POCBNP CBG: No results for input(s): GLUCAP in the last 168 hours. D-Dimer No results for input(s): DDIMER in the last 72 hours. Hgb A1c No results for input(s): HGBA1C in the last 72 hours. Lipid Profile No results for input(s): CHOL, HDL, LDLCALC, TRIG, CHOLHDL, LDLDIRECT in the last 72 hours. Thyroid function studies No results for input(s): TSH, T4TOTAL, T3FREE, THYROIDAB in the last 72 hours.  Invalid input(s): FREET3 Anemia work up No results for input(s): VITAMINB12, FOLATE, FERRITIN, TIBC, IRON, RETICCTPCT in the last 72 hours. Urinalysis    Component Value Date/Time   COLORURINE YELLOW 08/19/2017 0258   APPEARANCEUR CLEAR 08/19/2017 0258   LABSPEC 1.015 08/19/2017 0258   PHURINE 5.0 08/19/2017 0258   GLUCOSEU NEGATIVE 08/19/2017 0258   HGBUR NEGATIVE 08/19/2017 0258   BILIRUBINUR NEGATIVE 08/19/2017 0258   KETONESUR NEGATIVE 08/19/2017 0258   PROTEINUR 30 (A) 08/19/2017 0258   UROBILINOGEN 0.2 12/21/2014 1409   NITRITE NEGATIVE 08/19/2017 0258   LEUKOCYTESUR NEGATIVE 08/19/2017 0258    Microbiology Recent Results (from the past 240 hour(s))  Culture, blood (routine x 2)     Status: None (Preliminary result)   Collection Time: 08/18/17  2:30 AM  Result Value Ref Range Status   Specimen Description BLOOD RIGHT ANTECUBITAL  Final   Special Requests   Final    BOTTLES DRAWN AEROBIC AND ANAEROBIC Blood Culture adequate volume   Culture   Final    NO GROWTH 4 DAYS Performed at [facility], [place] [address] [place], [place] [address]    Report Status PENDING   Incomplete  Culture, blood (routine x 2)     Status: None   Collection Time: 08/18/17  9:08 PM  Result Value Ref Range Status   Specimen Description BLOOD RIGHT ANTECUBITAL  Final   Special Requests   Final    BOTTLES DRAWN AEROBIC AND ANAEROBIC Blood Culture adequate volume   Culture   Final    NO GROWTH 5 DAYS Performed at [facility], [place] [address], [place], [place] [address]    Report Status 08/23/2017 FINAL  Final  Urine Culture     Status: None   Collection Time: 08/19/17  2:58 AM  Result Value Ref Range Status   Specimen Description URINE, RANDOM  Final   Special Requests NONE  Final   Culture   Final    NO GROWTH Performed at [facility], [address], [place], [place] [address]    Report Status 08/20/2017 FINAL  Final  MRSA PCR Screening     Status: None   Collection Time: 08/21/17  2:09 AM  Result Value Ref Range Status   MRSA by PCR NEGATIVE NEGATIVE Final    Comment:        The GeneXpert MRSA Assay (FDA approved for NASAL specimens only), is one component of a comprehensive MRSA colonization surveillance program. It is not intended to diagnose MRSA infection nor to guide or monitor treatment for MRSA infections. Performed at [facility], [place] [address], [place], [place] [address]     Time coordinating discharge: Approximately 40 minutes  Vance Gather, MD  [facility] 08/24/2017, 8:50 AM Pager [phone]

## 2017-08-24 NOTE — Consult Note (Signed)
   [facility] Inpatient Consult   08/24/2017  Manuel King 03/30/1930 [id number]  Patient screened for potential [facility] services. Patient is in the [facility] of the [facility] Management services under patient's [employer] plan.  HX with [facility] for ALF as a resident.  Patient is for Skilled nursing care at [facility].  No community [facility] Care Management needs. For questions contact:   Natividad Brood, RN BSN [facility] Liaison  [phone] business mobile phone Toll free office [phone]

## 2017-08-25 ENCOUNTER — Encounter (HOSPITAL_COMMUNITY): Payer: Self-pay

## 2017-08-25 ENCOUNTER — Emergency Department (HOSPITAL_COMMUNITY)
Admission: EM | Admit: 2017-08-25 | Discharge: 2017-08-25 | Disposition: A | Discharge status: Home / Self Care | Payer: Medicare Other | Attending: Emergency Medicine | Admitting: Emergency Medicine

## 2017-08-25 DIAGNOSIS — N183 Chronic kidney disease, stage 3 (moderate): Secondary | ICD-10-CM | POA: Diagnosis not present

## 2017-08-25 DIAGNOSIS — R040 Epistaxis: Secondary | ICD-10-CM | POA: Diagnosis not present

## 2017-08-25 DIAGNOSIS — Z87891 Personal history of nicotine dependence: Secondary | ICD-10-CM | POA: Diagnosis not present

## 2017-08-25 DIAGNOSIS — Z8546 Personal history of malignant neoplasm of prostate: Secondary | ICD-10-CM | POA: Insufficient documentation

## 2017-08-25 DIAGNOSIS — J690 Pneumonitis due to inhalation of food and vomit: Secondary | ICD-10-CM | POA: Diagnosis not present

## 2017-08-25 DIAGNOSIS — Z79899 Other long term (current) drug therapy: Secondary | ICD-10-CM | POA: Diagnosis not present

## 2017-08-25 DIAGNOSIS — F0390 Unspecified dementia without behavioral disturbance: Secondary | ICD-10-CM | POA: Insufficient documentation

## 2017-08-25 DIAGNOSIS — I129 Hypertensive chronic kidney disease with stage 1 through stage 4 chronic kidney disease, or unspecified chronic kidney disease: Secondary | ICD-10-CM | POA: Diagnosis not present

## 2017-08-25 DIAGNOSIS — Z8673 Personal history of transient ischemic attack (TIA), and cerebral infarction without residual deficits: Secondary | ICD-10-CM | POA: Diagnosis not present

## 2017-08-25 MED ORDER — OXYMETAZOLINE HCL 0.05 % NA SOLN
1.0000 | Freq: Once | NASAL | Status: AC
  Administered 2017-08-25: 1 via NASAL
  Filled 2017-08-25: qty 15

## 2017-08-25 NOTE — ED Notes (Signed)
Signature pad not working. Patient unable to sign.  

## 2017-08-25 NOTE — ED Triage Notes (Addendum)
Patient arrived via [facility] from nursing facility, [facility]. Patient c/o of nose bleed and coughing of blood clots. Patient given afrin both nostrils per ems.  Patient was just discharged recently for sepsis. Hx. Of stroke and dementia. Right arm restricted. Vs- 185-86, P-74, 98% room air, CBG-101.

## 2017-08-25 NOTE — ED Provider Notes (Signed)
[name] Provider Note   CSN: [id number] Arrival date & time: 08/25/17  4403     History   Chief Complaint Chief Complaint  Patient presents with  . Epistaxis    HPI Manuel King is a 84 y.o. male.  84 year old male with history of dementia presents with epistaxis from his right nose.  He currently does not take any blood thinners.  Was treated with Afrin prior to arrival and has only minimal bleeding at this time.  History is limited due to his current state.     Past Medical History:  Diagnosis Date  . Arthritis   . Cellulitis currently   right foot  . CVA (cerebral vascular accident) ([place])   . Dementia   . Hypertension   . Prostate cancer [facility])     Patient Active Problem List   Diagnosis Date Noted  . HLD (hyperlipidemia) 08/18/2017  . Dementia 08/18/2017  . Iron deficiency anemia 08/18/2017  . Acute renal failure superimposed on stage 3 chronic kidney disease ([place]) 08/18/2017  . Sepsis ([place]) 08/18/2017  . Urinary urgency 01/25/2015  . Ambulatory dysfunction 12/21/2014  . Generalized weakness 07/18/2014  . Fall 07/18/2014  . Malnutrition of moderate degree ([place]) 02/03/2014  . Bradycardia 02/01/2014  . Gout 09/16/2011  . AKI (acute kidney injury) ([place]) 09/12/2011  . Falls 09/12/2011  . Acute parotitis 05/10/2011  . History of CVA (cerebrovascular accident) 05/10/2011  . Vascular dementia 05/10/2011  . SLEEP APNEA 11/16/2008  . BRADYCARDIA 09/29/2008  . FATIGUE, CHRONIC 09/29/2008  . Essential hypertension 09/28/2008  . PROSTATE CANCER, HX OF 09/28/2008    Past Surgical History:  Procedure Laterality Date  . ANTERIOR CERVICAL DECOMP/DISCECTOMY FUSION  05/29/1999   Anterior C3 and C4 diskectomy, decompression of the spinal cord,bone bank graft, Synthes plate and microscope/notes 09/17/2010  . CARPAL TUNNEL RELEASE Left 09/27/1999   Archie Endo 09/17/2010  . CARPAL TUNNEL RELEASE Right 08/12/2000   Archie Endo 09/17/2010    . FOOT SURGERY     Partial excision left medial cuneiform.; Partial excision left first metatarsal./notes 09/17/2010  . INSERTION PROSTATE RADIATION SEED  09/2005   Archie Endo 09/17/2010  . NECK SURGERY     "BOIL ON MY NECK"  . spider bite surgery     on left leg        Home Medications    Prior to Admission medications   Medication Sig Start Date End Date Taking? Authorizing Provider  acetaminophen (TYLENOL) 500 MG tablet Take 1,000 mg by mouth every 6 (six) hours as needed.    Yes [provider]  acetaminophen (TYLENOL) 500 MG tablet Take 1,000 mg by mouth 2 (two) times daily.   Yes [provider]  allopurinol (ZYLOPRIM) 100 MG tablet Take 100 mg by mouth daily.   Yes [provider]  amLODipine (NORVASC) 5 MG tablet Take 5 mg by mouth daily.   Yes [provider]  amoxicillin-clavulanate (AUGMENTIN) 875-125 MG tablet Take 1 tablet by mouth every 12 (twelve) hours. 08/24/17  Yes Patrecia Pour, MD  carboxymethylcellul-glycerin (OPTIVE) 0.5-0.9 % ophthalmic solution Place 1 drop into both eyes as needed for dry eyes.   Yes [provider]  divalproex (DEPAKOTE) 250 MG DR tablet Take 250-500 mg by mouth 2 (two) times daily. Take 250 mg  tablet in the am and 500 mg  tablets in the pm   Yes [provider]  docusate sodium (COLACE) 100 MG capsule Take 100 mg by mouth daily.  Yes [provider]  donepezil (ARICEPT) 10 MG tablet Take 10 mg by mouth at bedtime.   Yes [provider]  ferrous sulfate 325 (65 FE) MG EC tablet Take 325 mg by mouth 2 (two) times daily.   Yes [provider]  hydrALAZINE (APRESOLINE) 25 MG tablet Take 1 tablet (25 mg total) by mouth every 8 (eight) hours. 12/25/14  Yes Theodis Blaze, MD  hydrochlorothiazide (HYDRODIURIL) 12.5 MG tablet Take 12.5 mg by mouth daily. 07/24/17  Yes [provider]  irbesartan (AVAPRO) 150 MG tablet Take 150 mg by mouth daily. 07/24/17  Yes [provider]  oxybutynin (DITROPAN) 5 MG tablet Take 5 mg by mouth 3 (three) times daily.   Yes [provider]  PARoxetine (PAXIL) 20 MG tablet Take 20 mg by mouth daily.   Yes [provider]  Polyethyl Glycol-Propyl Glycol (SYSTANE) 0.4-0.3 % SOLN Place 1 drop into both eyes at bedtime.   Yes [provider]  pravastatin (PRAVACHOL) 40 MG tablet Take 40 mg by mouth at bedtime.    Yes [provider]  traMADol (ULTRAM) 50 MG tablet Take 50 mg by mouth daily. 07/20/17  Yes [provider]    Family History Family History  Problem Relation Age of Onset  . Hypotension Mother   . Diabetes type II Mother   . CAD Mother   . Hypotension Father   . CAD Father   . CAD Sister     Social History Social History   Tobacco Use  . Smoking status: Former Smoker    Years: 15.00  . Smokeless tobacco: Never Used  Substance Use Topics  . Alcohol use: No    Alcohol/week: 0.6 oz    Types: 1 Glasses of wine per week    Comment: none in 2 years or more  . Drug use: No     Allergies   Patient has no known allergies.   Review of Systems Review of Systems  Unable to perform ROS: Dementia     Physical Exam Updated Vital Signs BP (!) 157/60 (BP Location: Left Arm)   Pulse 65   Temp 98.9 F (37.2 C) (Oral)   Resp 18   SpO2 98%   Physical Exam  Constitutional: He appears well-developed and well-nourished.  Non-toxic appearance.  HENT:  Head: Normocephalic and atraumatic.  No epistaxis noted from right nares no posterior epistaxis noted  Eyes: Pupils are equal, round, and reactive to light. Conjunctivae are normal.  Neck: Normal range of motion.  Cardiovascular: Normal rate.  Pulmonary/Chest: Effort normal.  Neurological: He is alert. No cranial nerve deficit. GCS eye subscore is 4. GCS verbal subscore is 4. GCS motor subscore is 6.  Skin: Skin is warm and dry.  Psychiatric: His mood appears anxious.  Nursing note and vitals  reviewed.    ED Treatments / Results  Labs (all labs ordered are listed, but only abnormal results are displayed) Labs Reviewed - No data to display  EKG None  Radiology No results found.  Procedures .Epistaxis Management Date/Time: 08/25/2017 10:24 AM Performed by: Lacretia Leigh, MD Authorized by: Lacretia Leigh, MD   Consent:    Consent obtained:  Verbal   Consent given by:  Patient   Risks discussed:  Bleeding   Alternatives discussed:  No treatment Anesthesia (see MAR for exact dosages):    Anesthesia method:  None Procedure details:    Treatment site:  R anterior   Treatment complexity:  Limited Post-procedure details:  Assessment:  Bleeding decreased Comments:     Patient pulled out packing after it was placed.   (including critical care time)  Medications Ordered in ED Medications  oxymetazoline (AFRIN) 0.05 % nasal spray 1 spray (1 spray Each Nare Given by Other 08/25/17 1018)     Initial Impression / Assessment and Plan / ED Course  I have reviewed the triage vital signs and the nursing notes.  Pertinent labs & imaging results that were available during my care of the patient were reviewed by me and considered in my medical decision making (see chart for details).     Patient is refusing to have another nasal packing placed.  Family is here now and I have discussed this with them.  He has no active bleeding at this time.  The agreeable to not forcing a nasal packing and return precautions given  Final Clinical Impressions(s) / ED Diagnoses   Final diagnoses:  None    ED Discharge Orders    None       Lacretia Leigh, MD 08/25/17 1112

## 2017-08-25 NOTE — ED Notes (Signed)
Bed: WA04 Expected date:  Expected time:  Means of arrival:  Comments: EMS-nose bleed

## 2017-08-26 DIAGNOSIS — J690 Pneumonitis due to inhalation of food and vomit: Secondary | ICD-10-CM | POA: Diagnosis not present

## 2017-08-26 DIAGNOSIS — Z8673 Personal history of transient ischemic attack (TIA), and cerebral infarction without residual deficits: Secondary | ICD-10-CM | POA: Diagnosis not present

## 2017-08-26 DIAGNOSIS — D649 Anemia, unspecified: Secondary | ICD-10-CM | POA: Diagnosis not present

## 2017-08-27 DIAGNOSIS — R042 Hemoptysis: Secondary | ICD-10-CM | POA: Diagnosis not present

## 2017-08-27 DIAGNOSIS — R05 Cough: Secondary | ICD-10-CM | POA: Diagnosis not present

## 2017-08-31 DIAGNOSIS — Z8673 Personal history of transient ischemic attack (TIA), and cerebral infarction without residual deficits: Secondary | ICD-10-CM | POA: Diagnosis not present

## 2017-08-31 DIAGNOSIS — J690 Pneumonitis due to inhalation of food and vomit: Secondary | ICD-10-CM | POA: Diagnosis not present

## 2017-08-31 DIAGNOSIS — R29898 Other symptoms and signs involving the musculoskeletal system: Secondary | ICD-10-CM | POA: Diagnosis not present

## 2017-09-30 ENCOUNTER — Other Ambulatory Visit: Payer: Self-pay

## 2017-09-30 NOTE — Patient Outreach (Signed)
[facility]) Care Management  09/30/2017  Manuel King 09/17/1929 [id number]    Medication Adherence call to Mr : Manuel King spoke with patient's daughter she said patient is at a long term facility and  they provide all his medication she also said patient is no longer taking Irbesartan 150 mg :patient's telephone number is disconnected under [place] has a new number [phone]. Patient is showing past due under [place].   [facility] Management Direct Dial [phone]  Fax [phone] [email or web address]

## 2017-11-02 DIAGNOSIS — G894 Chronic pain syndrome: Secondary | ICD-10-CM | POA: Diagnosis not present

## 2017-11-02 DIAGNOSIS — M79606 Pain in leg, unspecified: Secondary | ICD-10-CM | POA: Diagnosis not present

## 2017-11-09 DIAGNOSIS — D509 Iron deficiency anemia, unspecified: Secondary | ICD-10-CM | POA: Diagnosis not present

## 2017-11-09 DIAGNOSIS — M109 Gout, unspecified: Secondary | ICD-10-CM | POA: Diagnosis not present

## 2017-11-09 DIAGNOSIS — E782 Mixed hyperlipidemia: Secondary | ICD-10-CM | POA: Diagnosis not present

## 2017-11-09 DIAGNOSIS — H04123 Dry eye syndrome of bilateral lacrimal glands: Secondary | ICD-10-CM | POA: Diagnosis not present

## 2017-11-09 DIAGNOSIS — I10 Essential (primary) hypertension: Secondary | ICD-10-CM | POA: Diagnosis not present

## 2017-11-17 DIAGNOSIS — L603 Nail dystrophy: Secondary | ICD-10-CM | POA: Diagnosis not present

## 2017-11-17 DIAGNOSIS — B351 Tinea unguium: Secondary | ICD-10-CM | POA: Diagnosis not present

## 2017-11-30 DIAGNOSIS — G894 Chronic pain syndrome: Secondary | ICD-10-CM | POA: Diagnosis not present

## 2017-11-30 DIAGNOSIS — M79606 Pain in leg, unspecified: Secondary | ICD-10-CM | POA: Diagnosis not present

## 2017-12-02 DIAGNOSIS — M109 Gout, unspecified: Secondary | ICD-10-CM | POA: Diagnosis not present

## 2017-12-02 DIAGNOSIS — G894 Chronic pain syndrome: Secondary | ICD-10-CM | POA: Diagnosis not present

## 2017-12-02 DIAGNOSIS — M79606 Pain in leg, unspecified: Secondary | ICD-10-CM | POA: Diagnosis not present

## 2017-12-02 DIAGNOSIS — A419 Sepsis, unspecified organism: Secondary | ICD-10-CM | POA: Diagnosis not present

## 2017-12-02 DIAGNOSIS — D509 Iron deficiency anemia, unspecified: Secondary | ICD-10-CM | POA: Diagnosis not present

## 2017-12-14 DIAGNOSIS — I10 Essential (primary) hypertension: Secondary | ICD-10-CM | POA: Diagnosis not present

## 2017-12-14 DIAGNOSIS — D509 Iron deficiency anemia, unspecified: Secondary | ICD-10-CM | POA: Diagnosis not present

## 2017-12-14 DIAGNOSIS — M1990 Unspecified osteoarthritis, unspecified site: Secondary | ICD-10-CM | POA: Diagnosis not present

## 2017-12-14 DIAGNOSIS — M109 Gout, unspecified: Secondary | ICD-10-CM | POA: Diagnosis not present

## 2017-12-14 DIAGNOSIS — K5901 Slow transit constipation: Secondary | ICD-10-CM | POA: Diagnosis not present

## 2017-12-16 DIAGNOSIS — E119 Type 2 diabetes mellitus without complications: Secondary | ICD-10-CM | POA: Diagnosis not present

## 2017-12-16 DIAGNOSIS — E782 Mixed hyperlipidemia: Secondary | ICD-10-CM | POA: Diagnosis not present

## 2017-12-16 DIAGNOSIS — E039 Hypothyroidism, unspecified: Secondary | ICD-10-CM | POA: Diagnosis not present

## 2017-12-16 DIAGNOSIS — I10 Essential (primary) hypertension: Secondary | ICD-10-CM | POA: Diagnosis not present

## 2017-12-28 DIAGNOSIS — M79606 Pain in leg, unspecified: Secondary | ICD-10-CM | POA: Diagnosis not present

## 2017-12-28 DIAGNOSIS — G894 Chronic pain syndrome: Secondary | ICD-10-CM | POA: Diagnosis not present

## 2017-12-31 DIAGNOSIS — Z79899 Other long term (current) drug therapy: Secondary | ICD-10-CM | POA: Diagnosis not present

## 2018-01-01 DIAGNOSIS — G894 Chronic pain syndrome: Secondary | ICD-10-CM | POA: Diagnosis not present

## 2018-01-01 DIAGNOSIS — A419 Sepsis, unspecified organism: Secondary | ICD-10-CM | POA: Diagnosis not present

## 2018-01-01 DIAGNOSIS — D509 Iron deficiency anemia, unspecified: Secondary | ICD-10-CM | POA: Diagnosis not present

## 2018-01-01 DIAGNOSIS — M79606 Pain in leg, unspecified: Secondary | ICD-10-CM | POA: Diagnosis not present

## 2018-01-01 DIAGNOSIS — M109 Gout, unspecified: Secondary | ICD-10-CM | POA: Diagnosis not present

## 2018-01-11 DIAGNOSIS — N3281 Overactive bladder: Secondary | ICD-10-CM | POA: Diagnosis not present

## 2018-01-11 DIAGNOSIS — I10 Essential (primary) hypertension: Secondary | ICD-10-CM | POA: Diagnosis not present

## 2018-01-11 DIAGNOSIS — K5901 Slow transit constipation: Secondary | ICD-10-CM | POA: Diagnosis not present

## 2018-01-11 DIAGNOSIS — M109 Gout, unspecified: Secondary | ICD-10-CM | POA: Diagnosis not present

## 2018-01-11 DIAGNOSIS — D509 Iron deficiency anemia, unspecified: Secondary | ICD-10-CM | POA: Diagnosis not present

## 2018-01-19 DIAGNOSIS — B351 Tinea unguium: Secondary | ICD-10-CM | POA: Diagnosis not present

## 2018-01-19 DIAGNOSIS — I739 Peripheral vascular disease, unspecified: Secondary | ICD-10-CM | POA: Diagnosis not present

## 2018-01-19 DIAGNOSIS — L603 Nail dystrophy: Secondary | ICD-10-CM | POA: Diagnosis not present

## 2018-02-11 DIAGNOSIS — Z79899 Other long term (current) drug therapy: Secondary | ICD-10-CM | POA: Diagnosis not present

## 2018-02-15 DIAGNOSIS — G894 Chronic pain syndrome: Secondary | ICD-10-CM | POA: Diagnosis not present

## 2018-02-15 DIAGNOSIS — M79606 Pain in leg, unspecified: Secondary | ICD-10-CM | POA: Diagnosis not present

## 2018-02-22 DIAGNOSIS — H04123 Dry eye syndrome of bilateral lacrimal glands: Secondary | ICD-10-CM | POA: Diagnosis not present

## 2018-02-22 DIAGNOSIS — E782 Mixed hyperlipidemia: Secondary | ICD-10-CM | POA: Diagnosis not present

## 2018-02-22 DIAGNOSIS — M109 Gout, unspecified: Secondary | ICD-10-CM | POA: Diagnosis not present

## 2018-02-22 DIAGNOSIS — D509 Iron deficiency anemia, unspecified: Secondary | ICD-10-CM | POA: Diagnosis not present

## 2018-02-22 DIAGNOSIS — N3281 Overactive bladder: Secondary | ICD-10-CM | POA: Diagnosis not present

## 2018-03-15 DIAGNOSIS — M79606 Pain in leg, unspecified: Secondary | ICD-10-CM | POA: Diagnosis not present

## 2018-03-15 DIAGNOSIS — G894 Chronic pain syndrome: Secondary | ICD-10-CM | POA: Diagnosis not present

## 2018-03-22 DIAGNOSIS — K5901 Slow transit constipation: Secondary | ICD-10-CM | POA: Diagnosis not present

## 2018-03-22 DIAGNOSIS — E782 Mixed hyperlipidemia: Secondary | ICD-10-CM | POA: Diagnosis not present

## 2018-03-22 DIAGNOSIS — M1990 Unspecified osteoarthritis, unspecified site: Secondary | ICD-10-CM | POA: Diagnosis not present

## 2018-03-22 DIAGNOSIS — H04123 Dry eye syndrome of bilateral lacrimal glands: Secondary | ICD-10-CM | POA: Diagnosis not present

## 2018-03-22 DIAGNOSIS — M109 Gout, unspecified: Secondary | ICD-10-CM | POA: Diagnosis not present

## 2018-04-12 DIAGNOSIS — G894 Chronic pain syndrome: Secondary | ICD-10-CM | POA: Diagnosis not present

## 2018-04-12 DIAGNOSIS — M79606 Pain in leg, unspecified: Secondary | ICD-10-CM | POA: Diagnosis not present

## 2018-04-19 DIAGNOSIS — N3281 Overactive bladder: Secondary | ICD-10-CM | POA: Diagnosis not present

## 2018-04-19 DIAGNOSIS — K5901 Slow transit constipation: Secondary | ICD-10-CM | POA: Diagnosis not present

## 2018-04-19 DIAGNOSIS — M1990 Unspecified osteoarthritis, unspecified site: Secondary | ICD-10-CM | POA: Diagnosis not present

## 2018-04-19 DIAGNOSIS — H04123 Dry eye syndrome of bilateral lacrimal glands: Secondary | ICD-10-CM | POA: Diagnosis not present

## 2018-04-19 DIAGNOSIS — I10 Essential (primary) hypertension: Secondary | ICD-10-CM | POA: Diagnosis not present

## 2018-05-17 DIAGNOSIS — G894 Chronic pain syndrome: Secondary | ICD-10-CM | POA: Diagnosis not present

## 2018-05-17 DIAGNOSIS — M79606 Pain in leg, unspecified: Secondary | ICD-10-CM | POA: Diagnosis not present

## 2018-05-24 DIAGNOSIS — Z0000 Encounter for general adult medical examination without abnormal findings: Secondary | ICD-10-CM | POA: Diagnosis not present

## 2018-06-14 DIAGNOSIS — M79606 Pain in leg, unspecified: Secondary | ICD-10-CM | POA: Diagnosis not present

## 2018-06-14 DIAGNOSIS — G894 Chronic pain syndrome: Secondary | ICD-10-CM | POA: Diagnosis not present

## 2018-06-21 DIAGNOSIS — N3281 Overactive bladder: Secondary | ICD-10-CM | POA: Diagnosis not present

## 2018-06-21 DIAGNOSIS — K5901 Slow transit constipation: Secondary | ICD-10-CM | POA: Diagnosis not present

## 2018-06-21 DIAGNOSIS — I10 Essential (primary) hypertension: Secondary | ICD-10-CM | POA: Diagnosis not present

## 2018-06-21 DIAGNOSIS — E782 Mixed hyperlipidemia: Secondary | ICD-10-CM | POA: Diagnosis not present

## 2018-06-21 DIAGNOSIS — D509 Iron deficiency anemia, unspecified: Secondary | ICD-10-CM | POA: Diagnosis not present

## 2018-07-12 DIAGNOSIS — M79606 Pain in leg, unspecified: Secondary | ICD-10-CM | POA: Diagnosis not present

## 2018-07-12 DIAGNOSIS — G894 Chronic pain syndrome: Secondary | ICD-10-CM | POA: Diagnosis not present

## 2018-07-14 DIAGNOSIS — E039 Hypothyroidism, unspecified: Secondary | ICD-10-CM | POA: Diagnosis not present

## 2018-07-14 DIAGNOSIS — I10 Essential (primary) hypertension: Secondary | ICD-10-CM | POA: Diagnosis not present

## 2018-07-14 DIAGNOSIS — E782 Mixed hyperlipidemia: Secondary | ICD-10-CM | POA: Diagnosis not present

## 2018-07-19 DIAGNOSIS — E782 Mixed hyperlipidemia: Secondary | ICD-10-CM | POA: Diagnosis not present

## 2018-07-19 DIAGNOSIS — D509 Iron deficiency anemia, unspecified: Secondary | ICD-10-CM | POA: Diagnosis not present

## 2018-07-19 DIAGNOSIS — K5901 Slow transit constipation: Secondary | ICD-10-CM | POA: Diagnosis not present

## 2018-07-19 DIAGNOSIS — N3281 Overactive bladder: Secondary | ICD-10-CM | POA: Diagnosis not present

## 2018-07-19 DIAGNOSIS — M1990 Unspecified osteoarthritis, unspecified site: Secondary | ICD-10-CM | POA: Diagnosis not present

## 2018-07-26 DIAGNOSIS — Z79899 Other long term (current) drug therapy: Secondary | ICD-10-CM | POA: Diagnosis not present

## 2018-08-09 DIAGNOSIS — M79606 Pain in leg, unspecified: Secondary | ICD-10-CM | POA: Diagnosis not present

## 2018-08-09 DIAGNOSIS — G894 Chronic pain syndrome: Secondary | ICD-10-CM | POA: Diagnosis not present

## 2018-08-18 DIAGNOSIS — I739 Peripheral vascular disease, unspecified: Secondary | ICD-10-CM | POA: Diagnosis not present

## 2018-08-18 DIAGNOSIS — I129 Hypertensive chronic kidney disease with stage 1 through stage 4 chronic kidney disease, or unspecified chronic kidney disease: Secondary | ICD-10-CM | POA: Diagnosis not present

## 2018-08-18 DIAGNOSIS — Z87891 Personal history of nicotine dependence: Secondary | ICD-10-CM | POA: Diagnosis not present

## 2018-08-18 DIAGNOSIS — R1310 Dysphagia, unspecified: Secondary | ICD-10-CM | POA: Diagnosis not present

## 2018-08-18 DIAGNOSIS — G4730 Sleep apnea, unspecified: Secondary | ICD-10-CM | POA: Diagnosis not present

## 2018-08-18 DIAGNOSIS — M109 Gout, unspecified: Secondary | ICD-10-CM | POA: Diagnosis not present

## 2018-08-18 DIAGNOSIS — M79605 Pain in left leg: Secondary | ICD-10-CM | POA: Diagnosis not present

## 2018-08-18 DIAGNOSIS — D509 Iron deficiency anemia, unspecified: Secondary | ICD-10-CM | POA: Diagnosis not present

## 2018-08-18 DIAGNOSIS — N183 Chronic kidney disease, stage 3 (moderate): Secondary | ICD-10-CM | POA: Diagnosis not present

## 2018-08-18 DIAGNOSIS — I69398 Other sequelae of cerebral infarction: Secondary | ICD-10-CM | POA: Diagnosis not present

## 2018-08-18 DIAGNOSIS — M79604 Pain in right leg: Secondary | ICD-10-CM | POA: Diagnosis not present

## 2018-08-18 DIAGNOSIS — E785 Hyperlipidemia, unspecified: Secondary | ICD-10-CM | POA: Diagnosis not present

## 2018-08-18 DIAGNOSIS — G894 Chronic pain syndrome: Secondary | ICD-10-CM | POA: Diagnosis not present

## 2018-08-18 DIAGNOSIS — R531 Weakness: Secondary | ICD-10-CM | POA: Diagnosis not present

## 2018-08-18 DIAGNOSIS — E440 Moderate protein-calorie malnutrition: Secondary | ICD-10-CM | POA: Diagnosis not present

## 2018-08-18 DIAGNOSIS — Z9181 History of falling: Secondary | ICD-10-CM | POA: Diagnosis not present

## 2018-08-23 DIAGNOSIS — H04123 Dry eye syndrome of bilateral lacrimal glands: Secondary | ICD-10-CM | POA: Diagnosis not present

## 2018-08-23 DIAGNOSIS — N3281 Overactive bladder: Secondary | ICD-10-CM | POA: Diagnosis not present

## 2018-08-23 DIAGNOSIS — E782 Mixed hyperlipidemia: Secondary | ICD-10-CM | POA: Diagnosis not present

## 2018-08-23 DIAGNOSIS — M109 Gout, unspecified: Secondary | ICD-10-CM | POA: Diagnosis not present

## 2018-08-23 DIAGNOSIS — M1990 Unspecified osteoarthritis, unspecified site: Secondary | ICD-10-CM | POA: Diagnosis not present

## 2018-08-24 DIAGNOSIS — Z9181 History of falling: Secondary | ICD-10-CM | POA: Diagnosis not present

## 2018-08-24 DIAGNOSIS — I69398 Other sequelae of cerebral infarction: Secondary | ICD-10-CM | POA: Diagnosis not present

## 2018-08-24 DIAGNOSIS — M109 Gout, unspecified: Secondary | ICD-10-CM | POA: Diagnosis not present

## 2018-08-24 DIAGNOSIS — R1310 Dysphagia, unspecified: Secondary | ICD-10-CM | POA: Diagnosis not present

## 2018-08-24 DIAGNOSIS — G4730 Sleep apnea, unspecified: Secondary | ICD-10-CM | POA: Diagnosis not present

## 2018-08-24 DIAGNOSIS — Z87891 Personal history of nicotine dependence: Secondary | ICD-10-CM | POA: Diagnosis not present

## 2018-08-24 DIAGNOSIS — N183 Chronic kidney disease, stage 3 (moderate): Secondary | ICD-10-CM | POA: Diagnosis not present

## 2018-08-24 DIAGNOSIS — I129 Hypertensive chronic kidney disease with stage 1 through stage 4 chronic kidney disease, or unspecified chronic kidney disease: Secondary | ICD-10-CM | POA: Diagnosis not present

## 2018-08-24 DIAGNOSIS — G894 Chronic pain syndrome: Secondary | ICD-10-CM | POA: Diagnosis not present

## 2018-08-24 DIAGNOSIS — M79605 Pain in left leg: Secondary | ICD-10-CM | POA: Diagnosis not present

## 2018-08-24 DIAGNOSIS — R531 Weakness: Secondary | ICD-10-CM | POA: Diagnosis not present

## 2018-08-24 DIAGNOSIS — M79604 Pain in right leg: Secondary | ICD-10-CM | POA: Diagnosis not present

## 2018-08-24 DIAGNOSIS — E440 Moderate protein-calorie malnutrition: Secondary | ICD-10-CM | POA: Diagnosis not present

## 2018-08-24 DIAGNOSIS — D509 Iron deficiency anemia, unspecified: Secondary | ICD-10-CM | POA: Diagnosis not present

## 2018-08-24 DIAGNOSIS — I739 Peripheral vascular disease, unspecified: Secondary | ICD-10-CM | POA: Diagnosis not present

## 2018-08-24 DIAGNOSIS — E785 Hyperlipidemia, unspecified: Secondary | ICD-10-CM | POA: Diagnosis not present

## 2018-08-31 DIAGNOSIS — R531 Weakness: Secondary | ICD-10-CM | POA: Diagnosis not present

## 2018-08-31 DIAGNOSIS — R1310 Dysphagia, unspecified: Secondary | ICD-10-CM | POA: Diagnosis not present

## 2018-08-31 DIAGNOSIS — I129 Hypertensive chronic kidney disease with stage 1 through stage 4 chronic kidney disease, or unspecified chronic kidney disease: Secondary | ICD-10-CM | POA: Diagnosis not present

## 2018-08-31 DIAGNOSIS — I69398 Other sequelae of cerebral infarction: Secondary | ICD-10-CM | POA: Diagnosis not present

## 2018-09-01 DIAGNOSIS — M109 Gout, unspecified: Secondary | ICD-10-CM | POA: Diagnosis not present

## 2018-09-01 DIAGNOSIS — G894 Chronic pain syndrome: Secondary | ICD-10-CM | POA: Diagnosis not present

## 2018-09-01 DIAGNOSIS — G4730 Sleep apnea, unspecified: Secondary | ICD-10-CM | POA: Diagnosis not present

## 2018-09-01 DIAGNOSIS — I739 Peripheral vascular disease, unspecified: Secondary | ICD-10-CM | POA: Diagnosis not present

## 2018-09-01 DIAGNOSIS — N183 Chronic kidney disease, stage 3 (moderate): Secondary | ICD-10-CM | POA: Diagnosis not present

## 2018-09-01 DIAGNOSIS — Z87891 Personal history of nicotine dependence: Secondary | ICD-10-CM | POA: Diagnosis not present

## 2018-09-01 DIAGNOSIS — I129 Hypertensive chronic kidney disease with stage 1 through stage 4 chronic kidney disease, or unspecified chronic kidney disease: Secondary | ICD-10-CM | POA: Diagnosis not present

## 2018-09-01 DIAGNOSIS — R1310 Dysphagia, unspecified: Secondary | ICD-10-CM | POA: Diagnosis not present

## 2018-09-01 DIAGNOSIS — I69398 Other sequelae of cerebral infarction: Secondary | ICD-10-CM | POA: Diagnosis not present

## 2018-09-01 DIAGNOSIS — D509 Iron deficiency anemia, unspecified: Secondary | ICD-10-CM | POA: Diagnosis not present

## 2018-09-01 DIAGNOSIS — R531 Weakness: Secondary | ICD-10-CM | POA: Diagnosis not present

## 2018-09-01 DIAGNOSIS — Z9181 History of falling: Secondary | ICD-10-CM | POA: Diagnosis not present

## 2018-09-01 DIAGNOSIS — M79604 Pain in right leg: Secondary | ICD-10-CM | POA: Diagnosis not present

## 2018-09-01 DIAGNOSIS — M79605 Pain in left leg: Secondary | ICD-10-CM | POA: Diagnosis not present

## 2018-09-01 DIAGNOSIS — E785 Hyperlipidemia, unspecified: Secondary | ICD-10-CM | POA: Diagnosis not present

## 2018-09-01 DIAGNOSIS — E440 Moderate protein-calorie malnutrition: Secondary | ICD-10-CM | POA: Diagnosis not present

## 2018-09-02 DIAGNOSIS — M79604 Pain in right leg: Secondary | ICD-10-CM | POA: Diagnosis not present

## 2018-09-02 DIAGNOSIS — G4730 Sleep apnea, unspecified: Secondary | ICD-10-CM | POA: Diagnosis not present

## 2018-09-02 DIAGNOSIS — Z87891 Personal history of nicotine dependence: Secondary | ICD-10-CM | POA: Diagnosis not present

## 2018-09-02 DIAGNOSIS — I739 Peripheral vascular disease, unspecified: Secondary | ICD-10-CM | POA: Diagnosis not present

## 2018-09-02 DIAGNOSIS — M79605 Pain in left leg: Secondary | ICD-10-CM | POA: Diagnosis not present

## 2018-09-02 DIAGNOSIS — I129 Hypertensive chronic kidney disease with stage 1 through stage 4 chronic kidney disease, or unspecified chronic kidney disease: Secondary | ICD-10-CM | POA: Diagnosis not present

## 2018-09-02 DIAGNOSIS — I69398 Other sequelae of cerebral infarction: Secondary | ICD-10-CM | POA: Diagnosis not present

## 2018-09-02 DIAGNOSIS — G894 Chronic pain syndrome: Secondary | ICD-10-CM | POA: Diagnosis not present

## 2018-09-02 DIAGNOSIS — E785 Hyperlipidemia, unspecified: Secondary | ICD-10-CM | POA: Diagnosis not present

## 2018-09-02 DIAGNOSIS — M109 Gout, unspecified: Secondary | ICD-10-CM | POA: Diagnosis not present

## 2018-09-02 DIAGNOSIS — Z9181 History of falling: Secondary | ICD-10-CM | POA: Diagnosis not present

## 2018-09-02 DIAGNOSIS — R1310 Dysphagia, unspecified: Secondary | ICD-10-CM | POA: Diagnosis not present

## 2018-09-02 DIAGNOSIS — N183 Chronic kidney disease, stage 3 (moderate): Secondary | ICD-10-CM | POA: Diagnosis not present

## 2018-09-02 DIAGNOSIS — D509 Iron deficiency anemia, unspecified: Secondary | ICD-10-CM | POA: Diagnosis not present

## 2018-09-02 DIAGNOSIS — R531 Weakness: Secondary | ICD-10-CM | POA: Diagnosis not present

## 2018-09-02 DIAGNOSIS — E440 Moderate protein-calorie malnutrition: Secondary | ICD-10-CM | POA: Diagnosis not present

## 2018-09-06 DIAGNOSIS — R531 Weakness: Secondary | ICD-10-CM | POA: Diagnosis not present

## 2018-09-06 DIAGNOSIS — M79605 Pain in left leg: Secondary | ICD-10-CM | POA: Diagnosis not present

## 2018-09-06 DIAGNOSIS — M109 Gout, unspecified: Secondary | ICD-10-CM | POA: Diagnosis not present

## 2018-09-06 DIAGNOSIS — D509 Iron deficiency anemia, unspecified: Secondary | ICD-10-CM | POA: Diagnosis not present

## 2018-09-06 DIAGNOSIS — N183 Chronic kidney disease, stage 3 (moderate): Secondary | ICD-10-CM | POA: Diagnosis not present

## 2018-09-06 DIAGNOSIS — Z9181 History of falling: Secondary | ICD-10-CM | POA: Diagnosis not present

## 2018-09-06 DIAGNOSIS — M79606 Pain in leg, unspecified: Secondary | ICD-10-CM | POA: Diagnosis not present

## 2018-09-06 DIAGNOSIS — E785 Hyperlipidemia, unspecified: Secondary | ICD-10-CM | POA: Diagnosis not present

## 2018-09-06 DIAGNOSIS — I69398 Other sequelae of cerebral infarction: Secondary | ICD-10-CM | POA: Diagnosis not present

## 2018-09-06 DIAGNOSIS — G894 Chronic pain syndrome: Secondary | ICD-10-CM | POA: Diagnosis not present

## 2018-09-06 DIAGNOSIS — G4730 Sleep apnea, unspecified: Secondary | ICD-10-CM | POA: Diagnosis not present

## 2018-09-06 DIAGNOSIS — Z87891 Personal history of nicotine dependence: Secondary | ICD-10-CM | POA: Diagnosis not present

## 2018-09-06 DIAGNOSIS — I129 Hypertensive chronic kidney disease with stage 1 through stage 4 chronic kidney disease, or unspecified chronic kidney disease: Secondary | ICD-10-CM | POA: Diagnosis not present

## 2018-09-06 DIAGNOSIS — R1310 Dysphagia, unspecified: Secondary | ICD-10-CM | POA: Diagnosis not present

## 2018-09-06 DIAGNOSIS — E440 Moderate protein-calorie malnutrition: Secondary | ICD-10-CM | POA: Diagnosis not present

## 2018-09-06 DIAGNOSIS — I739 Peripheral vascular disease, unspecified: Secondary | ICD-10-CM | POA: Diagnosis not present

## 2018-09-06 DIAGNOSIS — M79604 Pain in right leg: Secondary | ICD-10-CM | POA: Diagnosis not present

## 2018-09-08 DIAGNOSIS — M79604 Pain in right leg: Secondary | ICD-10-CM | POA: Diagnosis not present

## 2018-09-08 DIAGNOSIS — I129 Hypertensive chronic kidney disease with stage 1 through stage 4 chronic kidney disease, or unspecified chronic kidney disease: Secondary | ICD-10-CM | POA: Diagnosis not present

## 2018-09-08 DIAGNOSIS — M79605 Pain in left leg: Secondary | ICD-10-CM | POA: Diagnosis not present

## 2018-09-08 DIAGNOSIS — G4730 Sleep apnea, unspecified: Secondary | ICD-10-CM | POA: Diagnosis not present

## 2018-09-08 DIAGNOSIS — E785 Hyperlipidemia, unspecified: Secondary | ICD-10-CM | POA: Diagnosis not present

## 2018-09-08 DIAGNOSIS — I69398 Other sequelae of cerebral infarction: Secondary | ICD-10-CM | POA: Diagnosis not present

## 2018-09-08 DIAGNOSIS — G894 Chronic pain syndrome: Secondary | ICD-10-CM | POA: Diagnosis not present

## 2018-09-08 DIAGNOSIS — Z87891 Personal history of nicotine dependence: Secondary | ICD-10-CM | POA: Diagnosis not present

## 2018-09-08 DIAGNOSIS — R531 Weakness: Secondary | ICD-10-CM | POA: Diagnosis not present

## 2018-09-08 DIAGNOSIS — I739 Peripheral vascular disease, unspecified: Secondary | ICD-10-CM | POA: Diagnosis not present

## 2018-09-08 DIAGNOSIS — Z9181 History of falling: Secondary | ICD-10-CM | POA: Diagnosis not present

## 2018-09-08 DIAGNOSIS — R1310 Dysphagia, unspecified: Secondary | ICD-10-CM | POA: Diagnosis not present

## 2018-09-08 DIAGNOSIS — M109 Gout, unspecified: Secondary | ICD-10-CM | POA: Diagnosis not present

## 2018-09-08 DIAGNOSIS — D509 Iron deficiency anemia, unspecified: Secondary | ICD-10-CM | POA: Diagnosis not present

## 2018-09-08 DIAGNOSIS — N183 Chronic kidney disease, stage 3 (moderate): Secondary | ICD-10-CM | POA: Diagnosis not present

## 2018-09-08 DIAGNOSIS — E440 Moderate protein-calorie malnutrition: Secondary | ICD-10-CM | POA: Diagnosis not present

## 2018-09-13 DIAGNOSIS — M79605 Pain in left leg: Secondary | ICD-10-CM | POA: Diagnosis not present

## 2018-09-13 DIAGNOSIS — R1310 Dysphagia, unspecified: Secondary | ICD-10-CM | POA: Diagnosis not present

## 2018-09-13 DIAGNOSIS — N183 Chronic kidney disease, stage 3 (moderate): Secondary | ICD-10-CM | POA: Diagnosis not present

## 2018-09-13 DIAGNOSIS — D509 Iron deficiency anemia, unspecified: Secondary | ICD-10-CM | POA: Diagnosis not present

## 2018-09-13 DIAGNOSIS — G894 Chronic pain syndrome: Secondary | ICD-10-CM | POA: Diagnosis not present

## 2018-09-13 DIAGNOSIS — M79604 Pain in right leg: Secondary | ICD-10-CM | POA: Diagnosis not present

## 2018-09-13 DIAGNOSIS — M109 Gout, unspecified: Secondary | ICD-10-CM | POA: Diagnosis not present

## 2018-09-13 DIAGNOSIS — I129 Hypertensive chronic kidney disease with stage 1 through stage 4 chronic kidney disease, or unspecified chronic kidney disease: Secondary | ICD-10-CM | POA: Diagnosis not present

## 2018-09-13 DIAGNOSIS — E440 Moderate protein-calorie malnutrition: Secondary | ICD-10-CM | POA: Diagnosis not present

## 2018-09-13 DIAGNOSIS — I69398 Other sequelae of cerebral infarction: Secondary | ICD-10-CM | POA: Diagnosis not present

## 2018-09-13 DIAGNOSIS — R531 Weakness: Secondary | ICD-10-CM | POA: Diagnosis not present

## 2018-09-13 DIAGNOSIS — E785 Hyperlipidemia, unspecified: Secondary | ICD-10-CM | POA: Diagnosis not present

## 2018-09-13 DIAGNOSIS — Z87891 Personal history of nicotine dependence: Secondary | ICD-10-CM | POA: Diagnosis not present

## 2018-09-13 DIAGNOSIS — Z9181 History of falling: Secondary | ICD-10-CM | POA: Diagnosis not present

## 2018-09-13 DIAGNOSIS — I739 Peripheral vascular disease, unspecified: Secondary | ICD-10-CM | POA: Diagnosis not present

## 2018-09-13 DIAGNOSIS — G4730 Sleep apnea, unspecified: Secondary | ICD-10-CM | POA: Diagnosis not present

## 2018-09-21 DIAGNOSIS — M79605 Pain in left leg: Secondary | ICD-10-CM | POA: Diagnosis not present

## 2018-09-21 DIAGNOSIS — Z87891 Personal history of nicotine dependence: Secondary | ICD-10-CM | POA: Diagnosis not present

## 2018-09-21 DIAGNOSIS — I739 Peripheral vascular disease, unspecified: Secondary | ICD-10-CM | POA: Diagnosis not present

## 2018-09-21 DIAGNOSIS — G4730 Sleep apnea, unspecified: Secondary | ICD-10-CM | POA: Diagnosis not present

## 2018-09-21 DIAGNOSIS — E785 Hyperlipidemia, unspecified: Secondary | ICD-10-CM | POA: Diagnosis not present

## 2018-09-21 DIAGNOSIS — I69398 Other sequelae of cerebral infarction: Secondary | ICD-10-CM | POA: Diagnosis not present

## 2018-09-21 DIAGNOSIS — E440 Moderate protein-calorie malnutrition: Secondary | ICD-10-CM | POA: Diagnosis not present

## 2018-09-21 DIAGNOSIS — D509 Iron deficiency anemia, unspecified: Secondary | ICD-10-CM | POA: Diagnosis not present

## 2018-09-21 DIAGNOSIS — M79604 Pain in right leg: Secondary | ICD-10-CM | POA: Diagnosis not present

## 2018-09-21 DIAGNOSIS — I129 Hypertensive chronic kidney disease with stage 1 through stage 4 chronic kidney disease, or unspecified chronic kidney disease: Secondary | ICD-10-CM | POA: Diagnosis not present

## 2018-09-21 DIAGNOSIS — G894 Chronic pain syndrome: Secondary | ICD-10-CM | POA: Diagnosis not present

## 2018-09-21 DIAGNOSIS — M109 Gout, unspecified: Secondary | ICD-10-CM | POA: Diagnosis not present

## 2018-09-21 DIAGNOSIS — N183 Chronic kidney disease, stage 3 (moderate): Secondary | ICD-10-CM | POA: Diagnosis not present

## 2018-09-21 DIAGNOSIS — R531 Weakness: Secondary | ICD-10-CM | POA: Diagnosis not present

## 2018-09-21 DIAGNOSIS — R1310 Dysphagia, unspecified: Secondary | ICD-10-CM | POA: Diagnosis not present

## 2018-09-21 DIAGNOSIS — Z9181 History of falling: Secondary | ICD-10-CM | POA: Diagnosis not present

## 2018-09-27 DIAGNOSIS — I10 Essential (primary) hypertension: Secondary | ICD-10-CM | POA: Diagnosis not present

## 2018-09-27 DIAGNOSIS — K5901 Slow transit constipation: Secondary | ICD-10-CM | POA: Diagnosis not present

## 2018-09-27 DIAGNOSIS — N3281 Overactive bladder: Secondary | ICD-10-CM | POA: Diagnosis not present

## 2018-09-27 DIAGNOSIS — M1990 Unspecified osteoarthritis, unspecified site: Secondary | ICD-10-CM | POA: Diagnosis not present

## 2018-09-27 DIAGNOSIS — M109 Gout, unspecified: Secondary | ICD-10-CM | POA: Diagnosis not present

## 2018-10-04 DIAGNOSIS — G894 Chronic pain syndrome: Secondary | ICD-10-CM | POA: Diagnosis not present

## 2018-10-04 DIAGNOSIS — M79606 Pain in leg, unspecified: Secondary | ICD-10-CM | POA: Diagnosis not present

## 2018-10-05 DIAGNOSIS — Z79899 Other long term (current) drug therapy: Secondary | ICD-10-CM | POA: Diagnosis not present

## 2018-10-25 DIAGNOSIS — K5901 Slow transit constipation: Secondary | ICD-10-CM | POA: Diagnosis not present

## 2018-10-25 DIAGNOSIS — D509 Iron deficiency anemia, unspecified: Secondary | ICD-10-CM | POA: Diagnosis not present

## 2018-10-25 DIAGNOSIS — E782 Mixed hyperlipidemia: Secondary | ICD-10-CM | POA: Diagnosis not present

## 2018-10-25 DIAGNOSIS — M109 Gout, unspecified: Secondary | ICD-10-CM | POA: Diagnosis not present

## 2018-10-25 DIAGNOSIS — I10 Essential (primary) hypertension: Secondary | ICD-10-CM | POA: Diagnosis not present

## 2018-11-08 DIAGNOSIS — M79606 Pain in leg, unspecified: Secondary | ICD-10-CM | POA: Diagnosis not present

## 2018-11-08 DIAGNOSIS — G894 Chronic pain syndrome: Secondary | ICD-10-CM | POA: Diagnosis not present

## 2018-11-10 DIAGNOSIS — Z03818 Encounter for observation for suspected exposure to other biological agents ruled out: Secondary | ICD-10-CM | POA: Diagnosis not present

## 2018-11-22 DIAGNOSIS — I10 Essential (primary) hypertension: Secondary | ICD-10-CM | POA: Diagnosis not present

## 2018-11-22 DIAGNOSIS — M109 Gout, unspecified: Secondary | ICD-10-CM | POA: Diagnosis not present

## 2018-11-22 DIAGNOSIS — D500 Iron deficiency anemia secondary to blood loss (chronic): Secondary | ICD-10-CM | POA: Diagnosis not present

## 2018-11-29 DIAGNOSIS — Z03818 Encounter for observation for suspected exposure to other biological agents ruled out: Secondary | ICD-10-CM | POA: Diagnosis not present

## 2018-12-07 DIAGNOSIS — L603 Nail dystrophy: Secondary | ICD-10-CM | POA: Diagnosis not present

## 2018-12-07 DIAGNOSIS — Q845 Enlarged and hypertrophic nails: Secondary | ICD-10-CM | POA: Diagnosis not present

## 2018-12-07 DIAGNOSIS — I739 Peripheral vascular disease, unspecified: Secondary | ICD-10-CM | POA: Diagnosis not present

## 2018-12-07 DIAGNOSIS — B351 Tinea unguium: Secondary | ICD-10-CM | POA: Diagnosis not present

## 2018-12-13 DIAGNOSIS — G894 Chronic pain syndrome: Secondary | ICD-10-CM | POA: Diagnosis not present

## 2018-12-15 DIAGNOSIS — Z03818 Encounter for observation for suspected exposure to other biological agents ruled out: Secondary | ICD-10-CM | POA: Diagnosis not present

## 2018-12-27 DIAGNOSIS — M1990 Unspecified osteoarthritis, unspecified site: Secondary | ICD-10-CM | POA: Diagnosis not present

## 2018-12-27 DIAGNOSIS — K5900 Constipation, unspecified: Secondary | ICD-10-CM | POA: Diagnosis not present

## 2018-12-27 DIAGNOSIS — E782 Mixed hyperlipidemia: Secondary | ICD-10-CM | POA: Diagnosis not present

## 2018-12-29 DIAGNOSIS — Z03818 Encounter for observation for suspected exposure to other biological agents ruled out: Secondary | ICD-10-CM | POA: Diagnosis not present

## 2019-01-04 ENCOUNTER — Emergency Department (HOSPITAL_COMMUNITY)
Admission: EM | Admit: 2019-01-04 | Discharge: 2019-01-04 | Disposition: A | Discharge status: Home / Self Care | Payer: Medicare Other | Attending: Emergency Medicine | Admitting: Emergency Medicine

## 2019-01-04 DIAGNOSIS — I129 Hypertensive chronic kidney disease with stage 1 through stage 4 chronic kidney disease, or unspecified chronic kidney disease: Secondary | ICD-10-CM | POA: Diagnosis not present

## 2019-01-04 DIAGNOSIS — Z87891 Personal history of nicotine dependence: Secondary | ICD-10-CM | POA: Insufficient documentation

## 2019-01-04 DIAGNOSIS — N183 Chronic kidney disease, stage 3 (moderate): Secondary | ICD-10-CM | POA: Insufficient documentation

## 2019-01-04 DIAGNOSIS — Z79899 Other long term (current) drug therapy: Secondary | ICD-10-CM | POA: Insufficient documentation

## 2019-01-04 DIAGNOSIS — R195 Other fecal abnormalities: Secondary | ICD-10-CM | POA: Diagnosis not present

## 2019-01-04 DIAGNOSIS — R410 Disorientation, unspecified: Secondary | ICD-10-CM | POA: Diagnosis not present

## 2019-01-04 DIAGNOSIS — M2550 Pain in unspecified joint: Secondary | ICD-10-CM | POA: Diagnosis not present

## 2019-01-04 DIAGNOSIS — Z8546 Personal history of malignant neoplasm of prostate: Secondary | ICD-10-CM | POA: Diagnosis not present

## 2019-01-04 DIAGNOSIS — K625 Hemorrhage of anus and rectum: Secondary | ICD-10-CM | POA: Diagnosis present

## 2019-01-04 DIAGNOSIS — R198 Other specified symptoms and signs involving the digestive system and abdomen: Secondary | ICD-10-CM

## 2019-01-04 DIAGNOSIS — I10 Essential (primary) hypertension: Secondary | ICD-10-CM | POA: Diagnosis not present

## 2019-01-04 DIAGNOSIS — N189 Chronic kidney disease, unspecified: Secondary | ICD-10-CM

## 2019-01-04 DIAGNOSIS — Z7401 Bed confinement status: Secondary | ICD-10-CM | POA: Diagnosis not present

## 2019-01-04 DIAGNOSIS — R001 Bradycardia, unspecified: Secondary | ICD-10-CM | POA: Diagnosis not present

## 2019-01-04 DIAGNOSIS — R58 Hemorrhage, not elsewhere classified: Secondary | ICD-10-CM | POA: Diagnosis not present

## 2019-01-04 DIAGNOSIS — R000 Tachycardia, unspecified: Secondary | ICD-10-CM | POA: Diagnosis not present

## 2019-01-04 LAB — CBC WITH DIFFERENTIAL/PLATELET
Abs Immature Granulocytes: 0.07 10*3/uL (ref 0.00–0.07)
Basophils Absolute: 0 10*3/uL (ref 0.0–0.1)
Basophils Relative: 1 %
Eosinophils Absolute: 0.1 10*3/uL (ref 0.0–0.5)
Eosinophils Relative: 2 %
HCT: 33.1 % — ABNORMAL LOW (ref 39.0–52.0)
Hemoglobin: 10.3 g/dL — ABNORMAL LOW (ref 13.0–17.0)
Immature Granulocytes: 1 %
Lymphocytes Relative: 35 %
Lymphs Abs: 2.5 10*3/uL (ref 0.7–4.0)
MCH: 28.7 pg (ref 26.0–34.0)
MCHC: 31.1 g/dL (ref 30.0–36.0)
MCV: 92.2 fL (ref 80.0–100.0)
Monocytes Absolute: 0.6 10*3/uL (ref 0.1–1.0)
Monocytes Relative: 8 %
Neutro Abs: 3.8 10*3/uL (ref 1.7–7.7)
Neutrophils Relative %: 53 %
Platelets: 216 10*3/uL (ref 150–400)
RBC: 3.59 MIL/uL — ABNORMAL LOW (ref 4.22–5.81)
RDW: 15.7 % — ABNORMAL HIGH (ref 11.5–15.5)
WBC: 7.1 10*3/uL (ref 4.0–10.5)
nRBC: 0 % (ref 0.0–0.2)

## 2019-01-04 LAB — COMPREHENSIVE METABOLIC PANEL
ALT: 13 U/L (ref 0–44)
AST: 12 U/L — ABNORMAL LOW (ref 15–41)
Albumin: 3.3 g/dL — ABNORMAL LOW (ref 3.5–5.0)
Alkaline Phosphatase: 63 U/L (ref 38–126)
Anion gap: 11 (ref 5–15)
BUN: 49 mg/dL — ABNORMAL HIGH (ref 8–23)
CO2: 19 mmol/L — ABNORMAL LOW (ref 22–32)
Calcium: 9 mg/dL (ref 8.9–10.3)
Chloride: 109 mmol/L (ref 98–111)
Creatinine, Ser: 1.99 mg/dL — ABNORMAL HIGH (ref 0.61–1.24)
GFR calc Af Amer: 34 mL/min — ABNORMAL LOW (ref >60)
GFR calc non Af Amer: 29 mL/min — ABNORMAL LOW (ref >60)
Glucose, Bld: 101 mg/dL — ABNORMAL HIGH (ref 70–99)
Potassium: 4.6 mmol/L (ref 3.5–5.1)
Sodium: 139 mmol/L (ref 135–145)
Total Bilirubin: 0.3 mg/dL (ref 0.3–1.2)
Total Protein: 7.1 g/dL (ref 6.5–8.1)

## 2019-01-04 LAB — TYPE AND SCREEN
ABO/RH(D): O POS
Antibody Screen: NEGATIVE

## 2019-01-04 LAB — POC OCCULT BLOOD, ED: Fecal Occult Bld: NEGATIVE

## 2019-01-04 LAB — PROTIME-INR
INR: 1.2 (ref 0.8–1.2)
Prothrombin Time: 15.2 seconds (ref 11.4–15.2)

## 2019-01-04 LAB — ABO/RH: ABO/RH(D): O POS

## 2019-01-04 NOTE — ED Notes (Signed)
[employer] notified of need for transport of pt back to facility.

## 2019-01-04 NOTE — ED Notes (Signed)
[facility] here for transport back to facility.  

## 2019-01-04 NOTE — ED Triage Notes (Signed)
Pt comes to ed via [facility], pt has dementia at baseline per ems, pt was being changed tonight at facility and noticed bleeding from his adult depend. V/s on arrival hr 58 reg, spo2 99, temp 97.7 bp 169/72. 20 in left hand.

## 2019-01-05 NOTE — ED Provider Notes (Signed)
[facility] DEPT Provider Note   CSN: [id number] Arrival date & time: 01/04/19  0043     History   Chief Complaint Chief Complaint  Patient presents with  . GI Bleeding    HPI Manuel King is a 85 y.o. male.      Rectal Bleeding Quality:  Bright red Amount:  Scant Timing:  Constant Chronicity:  New Similar prior episodes: no   Relieved by:  None tried Worsened by:  Nothing Ineffective treatments:  None tried Associated symptoms: no abdominal pain and no fever     Past Medical History:  Diagnosis Date  . Arthritis   . Cellulitis currently   right foot  . CVA (cerebral vascular accident) ([place])   . Dementia   . Hypertension   . Prostate cancer [facility])     Patient Active Problem List   Diagnosis Date Noted  . HLD (hyperlipidemia) 08/18/2017  . Dementia ([place]) 08/18/2017  . Iron deficiency anemia 08/18/2017  . Acute renal failure superimposed on stage 3 chronic kidney disease ([place]) 08/18/2017  . Sepsis ([place]) 08/18/2017  . Urinary urgency 01/25/2015  . Ambulatory dysfunction 12/21/2014  . Generalized weakness 07/18/2014  . Fall 07/18/2014  . Malnutrition of moderate degree ([place]) 02/03/2014  . Bradycardia 02/01/2014  . Gout 09/16/2011  . AKI (acute kidney injury) ([place]) 09/12/2011  . Falls 09/12/2011  . Acute parotitis 05/10/2011  . History of CVA (cerebrovascular accident) 05/10/2011  . Vascular dementia ([place]) 05/10/2011  . SLEEP APNEA 11/16/2008  . BRADYCARDIA 09/29/2008  . FATIGUE, CHRONIC 09/29/2008  . Essential hypertension 09/28/2008  . PROSTATE CANCER, HX OF 09/28/2008    Past Surgical History:  Procedure Laterality Date  . ANTERIOR CERVICAL DECOMP/DISCECTOMY FUSION  05/29/1999   Anterior C3 and C4 diskectomy, decompression of the spinal cord,bone bank graft, Synthes plate and microscope/notes 09/17/2010  . CARPAL TUNNEL RELEASE Left 09/27/1999   Archie Endo 09/17/2010  . CARPAL TUNNEL RELEASE Right 08/12/2000   Archie Endo  09/17/2010  . FOOT SURGERY     Partial excision left medial cuneiform.; Partial excision left first metatarsal./notes 09/17/2010  . INSERTION PROSTATE RADIATION SEED  09/2005   Archie Endo 09/17/2010  . NECK SURGERY     "BOIL ON MY NECK"  . spider bite surgery     on left leg        Home Medications    Prior to Admission medications   Medication Sig Start Date End Date Taking? Authorizing Provider  acetaminophen (TYLENOL) 500 MG tablet Take 1,000 mg by mouth every 6 (six) hours as needed.     [provider]  acetaminophen (TYLENOL) 500 MG tablet Take 1,000 mg by mouth 2 (two) times daily.    [provider]  allopurinol (ZYLOPRIM) 100 MG tablet Take 100 mg by mouth daily.    [provider]  amLODipine (NORVASC) 5 MG tablet Take 5 mg by mouth daily.    [provider]  amoxicillin-clavulanate (AUGMENTIN) 875-125 MG tablet Take 1 tablet by mouth every 12 (twelve) hours. 08/24/17   Patrecia Pour, MD  carboxymethylcellul-glycerin (OPTIVE) 0.5-0.9 % ophthalmic solution Place 1 drop into both eyes as needed for dry eyes.    [provider]  divalproex (DEPAKOTE) 250 MG DR tablet Take 250-500 mg by mouth 2 (two) times daily. Take 250 mg  tablet in the am and 500 mg  tablets in the pm    [provider]  docusate sodium (COLACE) 100 MG capsule Take 100 mg by mouth daily.  [provider]  donepezil (ARICEPT) 10 MG tablet Take 10 mg by mouth at bedtime.    [provider]  ferrous sulfate 325 (65 FE) MG EC tablet Take 325 mg by mouth 2 (two) times daily.    [provider]  hydrALAZINE (APRESOLINE) 25 MG tablet Take 1 tablet (25 mg total) by mouth every 8 (eight) hours. 12/25/14   Theodis Blaze, MD  hydrochlorothiazide (HYDRODIURIL) 12.5 MG tablet Take 12.5 mg by mouth daily. 07/24/17   [provider]  irbesartan (AVAPRO) 150 MG tablet Take 150 mg by mouth daily. 07/24/17   [provider]  oxybutynin  (DITROPAN) 5 MG tablet Take 5 mg by mouth 3 (three) times daily.    [provider]  PARoxetine (PAXIL) 20 MG tablet Take 20 mg by mouth daily.    [provider]  Polyethyl Glycol-Propyl Glycol (SYSTANE) 0.4-0.3 % SOLN Place 1 drop into both eyes at bedtime.    [provider]  pravastatin (PRAVACHOL) 40 MG tablet Take 40 mg by mouth at bedtime.     [provider]  traMADol (ULTRAM) 50 MG tablet Take 50 mg by mouth daily. 07/20/17   [provider]    Family History Family History  Problem Relation Age of Onset  . Hypotension Mother   . Diabetes type II Mother   . CAD Mother   . Hypotension Father   . CAD Father   . CAD Sister     Social History Social History   Tobacco Use  . Smoking status: Former Smoker    Years: 15.00  . Smokeless tobacco: Never Used  Substance Use Topics  . Alcohol use: No    Alcohol/week: 1.0 standard drinks    Types: 1 Glasses of wine per week    Comment: none in 2 years or more  . Drug use: No     Allergies   Patient has no known allergies.   Review of Systems Review of Systems  Unable to perform ROS: Dementia  Constitutional: Negative for fever.  Gastrointestinal: Positive for hematochezia. Negative for abdominal pain.     Physical Exam Updated Vital Signs BP (!) 154/79   Pulse (!) 56   Resp 15   SpO2 97%   Physical Exam Vitals signs and nursing note reviewed.  Constitutional:      Appearance: He is well-developed.  HENT:     Head: Normocephalic and atraumatic.     Mouth/Throat:     Mouth: Mucous membranes are moist.     Pharynx: Oropharynx is clear.  Eyes:     Extraocular Movements: Extraocular movements intact.     Conjunctiva/sclera: Conjunctivae normal.  Neck:     Musculoskeletal: Normal range of motion.  Cardiovascular:     Rate and Rhythm: Normal rate.  Pulmonary:     Effort: Pulmonary effort is normal. No respiratory distress.  Abdominal:     General: Abdomen is flat.  There is no distension.     Tenderness: There is no abdominal tenderness.  Genitourinary:    Rectum: Guaiac result negative.     Comments: Chaperoned by nursing. No areas of active bleeding, dried blood, recent bleeding. No fissures or hemorrhoids. Depends without blood or stool.  Musculoskeletal: Normal range of motion.  Skin:    General: Skin is warm and dry.  Neurological:     General: No focal deficit present.     Mental Status: He is alert.      ED Treatments / Results  Labs (all labs ordered are listed, but only abnormal results are displayed) Labs Reviewed  COMPREHENSIVE METABOLIC PANEL - Abnormal; Notable for the following components:      Result Value   CO2 19 (*)    Glucose, Bld 101 (*)    BUN 49 (*)    Creatinine, Ser 1.99 (*)    Albumin 3.3 (*)    AST 12 (*)    GFR calc non Af Amer 29 (*)    GFR calc Af Amer 34 (*)    All other components within normal limits  CBC WITH DIFFERENTIAL/PLATELET - Abnormal; Notable for the following components:   RBC 3.59 (*)    Hemoglobin 10.3 (*)    HCT 33.1 (*)    RDW 15.7 (*)    All other components within normal limits  PROTIME-INR  POC OCCULT BLOOD, ED  TYPE AND SCREEN  ABO/RH    EKG None  Radiology No results found.  Procedures Procedures (including critical care time)  Medications Ordered in ED Medications - No data to display   Initial Impression / Assessment and Plan / ED Course  I have reviewed the triage vital signs and the nursing notes.  Pertinent labs & imaging results that were available during my care of the patient were reviewed by me and considered in my medical decision making (see chart for details).   Patient without any evidence of GI bleeding here.  Normal vital signs, no bleeding on exam, negative stool guaiac, normal hemoglobin.  9 blood thinners.  No indication for admission or further work-up at this time.  Stable for discharge back to follow-up with his PCP.  Final Clinical  Impressions(s) / ED Diagnoses   Final diagnoses:  Abnormal bowel movement  Chronic kidney disease, unspecified CKD stage    ED Discharge Orders    None       Langdon Crosson, Corene Cornea, MD 01/05/19 [phone]

## 2019-01-07 ENCOUNTER — Other Ambulatory Visit: Payer: Self-pay

## 2019-01-07 ENCOUNTER — Emergency Department (HOSPITAL_COMMUNITY): Payer: Medicare Other

## 2019-01-07 ENCOUNTER — Inpatient Hospital Stay (HOSPITAL_COMMUNITY)
Admission: EM | Admit: 2019-01-07 | Discharge: 2019-01-11 | DRG: 871 | Disposition: A | Discharge status: Skilled Nursing Facility | Payer: Medicare Other | Source: Skilled Nursing Facility | Attending: Internal Medicine | Admitting: Internal Medicine

## 2019-01-07 ENCOUNTER — Encounter (HOSPITAL_COMMUNITY): Payer: Self-pay | Admitting: Emergency Medicine

## 2019-01-07 DIAGNOSIS — A419 Sepsis, unspecified organism: Secondary | ICD-10-CM | POA: Diagnosis not present

## 2019-01-07 DIAGNOSIS — Z79899 Other long term (current) drug therapy: Secondary | ICD-10-CM | POA: Diagnosis not present

## 2019-01-07 DIAGNOSIS — N390 Urinary tract infection, site not specified: Secondary | ICD-10-CM | POA: Diagnosis not present

## 2019-01-07 DIAGNOSIS — I129 Hypertensive chronic kidney disease with stage 1 through stage 4 chronic kidney disease, or unspecified chronic kidney disease: Secondary | ICD-10-CM | POA: Diagnosis not present

## 2019-01-07 DIAGNOSIS — R531 Weakness: Secondary | ICD-10-CM

## 2019-01-07 DIAGNOSIS — R079 Chest pain, unspecified: Secondary | ICD-10-CM | POA: Diagnosis not present

## 2019-01-07 DIAGNOSIS — R6520 Severe sepsis without septic shock: Secondary | ICD-10-CM | POA: Diagnosis not present

## 2019-01-07 DIAGNOSIS — Z79891 Long term (current) use of opiate analgesic: Secondary | ICD-10-CM | POA: Diagnosis not present

## 2019-01-07 DIAGNOSIS — Z8546 Personal history of malignant neoplasm of prostate: Secondary | ICD-10-CM

## 2019-01-07 DIAGNOSIS — R319 Hematuria, unspecified: Secondary | ICD-10-CM | POA: Diagnosis present

## 2019-01-07 DIAGNOSIS — Z833 Family history of diabetes mellitus: Secondary | ICD-10-CM

## 2019-01-07 DIAGNOSIS — N183 Chronic kidney disease, stage 3 unspecified: Secondary | ICD-10-CM | POA: Diagnosis present

## 2019-01-07 DIAGNOSIS — A4159 Other Gram-negative sepsis: Secondary | ICD-10-CM | POA: Diagnosis not present

## 2019-01-07 DIAGNOSIS — Z8249 Family history of ischemic heart disease and other diseases of the circulatory system: Secondary | ICD-10-CM | POA: Diagnosis not present

## 2019-01-07 DIAGNOSIS — Z981 Arthrodesis status: Secondary | ICD-10-CM | POA: Diagnosis not present

## 2019-01-07 DIAGNOSIS — R0689 Other abnormalities of breathing: Secondary | ICD-10-CM | POA: Diagnosis not present

## 2019-01-07 DIAGNOSIS — R32 Unspecified urinary incontinence: Secondary | ICD-10-CM | POA: Diagnosis present

## 2019-01-07 DIAGNOSIS — I69351 Hemiplegia and hemiparesis following cerebral infarction affecting right dominant side: Secondary | ICD-10-CM | POA: Diagnosis not present

## 2019-01-07 DIAGNOSIS — F329 Major depressive disorder, single episode, unspecified: Secondary | ICD-10-CM | POA: Diagnosis present

## 2019-01-07 DIAGNOSIS — Z8673 Personal history of transient ischemic attack (TIA), and cerebral infarction without residual deficits: Secondary | ICD-10-CM

## 2019-01-07 DIAGNOSIS — B961 Klebsiella pneumoniae [K. pneumoniae] as the cause of diseases classified elsewhere: Secondary | ICD-10-CM | POA: Diagnosis not present

## 2019-01-07 DIAGNOSIS — R338 Other retention of urine: Secondary | ICD-10-CM | POA: Diagnosis not present

## 2019-01-07 DIAGNOSIS — J189 Pneumonia, unspecified organism: Secondary | ICD-10-CM | POA: Diagnosis present

## 2019-01-07 DIAGNOSIS — D509 Iron deficiency anemia, unspecified: Secondary | ICD-10-CM | POA: Diagnosis present

## 2019-01-07 DIAGNOSIS — I10 Essential (primary) hypertension: Secondary | ICD-10-CM | POA: Diagnosis not present

## 2019-01-07 DIAGNOSIS — N179 Acute kidney failure, unspecified: Secondary | ICD-10-CM | POA: Diagnosis not present

## 2019-01-07 DIAGNOSIS — M1990 Unspecified osteoarthritis, unspecified site: Secondary | ICD-10-CM | POA: Diagnosis not present

## 2019-01-07 DIAGNOSIS — E538 Deficiency of other specified B group vitamins: Secondary | ICD-10-CM | POA: Diagnosis not present

## 2019-01-07 DIAGNOSIS — F0150 Vascular dementia without behavioral disturbance: Secondary | ICD-10-CM | POA: Diagnosis not present

## 2019-01-07 DIAGNOSIS — Z87891 Personal history of nicotine dependence: Secondary | ICD-10-CM

## 2019-01-07 DIAGNOSIS — E785 Hyperlipidemia, unspecified: Secondary | ICD-10-CM | POA: Diagnosis not present

## 2019-01-07 DIAGNOSIS — M109 Gout, unspecified: Secondary | ICD-10-CM | POA: Diagnosis present

## 2019-01-07 DIAGNOSIS — Z20828 Contact with and (suspected) exposure to other viral communicable diseases: Secondary | ICD-10-CM | POA: Diagnosis present

## 2019-01-07 DIAGNOSIS — R509 Fever, unspecified: Secondary | ICD-10-CM | POA: Diagnosis not present

## 2019-01-07 DIAGNOSIS — R000 Tachycardia, unspecified: Secondary | ICD-10-CM | POA: Diagnosis not present

## 2019-01-07 DIAGNOSIS — R069 Unspecified abnormalities of breathing: Secondary | ICD-10-CM | POA: Diagnosis not present

## 2019-01-07 DIAGNOSIS — A498 Other bacterial infections of unspecified site: Secondary | ICD-10-CM

## 2019-01-07 DIAGNOSIS — R0602 Shortness of breath: Secondary | ICD-10-CM | POA: Diagnosis not present

## 2019-01-07 LAB — CBC WITH DIFFERENTIAL/PLATELET
Abs Immature Granulocytes: 0.11 10*3/uL — ABNORMAL HIGH (ref 0.00–0.07)
Basophils Absolute: 0 10*3/uL (ref 0.0–0.1)
Basophils Relative: 0 %
Eosinophils Absolute: 0 10*3/uL (ref 0.0–0.5)
Eosinophils Relative: 0 %
HCT: 29.1 % — ABNORMAL LOW (ref 39.0–52.0)
Hemoglobin: 9.4 g/dL — ABNORMAL LOW (ref 13.0–17.0)
Immature Granulocytes: 1 %
Lymphocytes Relative: 9 %
Lymphs Abs: 1.1 10*3/uL (ref 0.7–4.0)
MCH: 29.5 pg (ref 26.0–34.0)
MCHC: 32.3 g/dL (ref 30.0–36.0)
MCV: 91.2 fL (ref 80.0–100.0)
Monocytes Absolute: 1.7 10*3/uL — ABNORMAL HIGH (ref 0.1–1.0)
Monocytes Relative: 12 %
Neutro Abs: 10.5 10*3/uL — ABNORMAL HIGH (ref 1.7–7.7)
Neutrophils Relative %: 78 %
Platelets: 183 10*3/uL (ref 150–400)
RBC: 3.19 MIL/uL — ABNORMAL LOW (ref 4.22–5.81)
RDW: 15.9 % — ABNORMAL HIGH (ref 11.5–15.5)
WBC: 13.4 10*3/uL — ABNORMAL HIGH (ref 4.0–10.5)
nRBC: 0 % (ref 0.0–0.2)

## 2019-01-07 LAB — COMPREHENSIVE METABOLIC PANEL
ALT: 12 U/L (ref 0–44)
AST: 16 U/L (ref 15–41)
Albumin: 3 g/dL — ABNORMAL LOW (ref 3.5–5.0)
Alkaline Phosphatase: 59 U/L (ref 38–126)
Anion gap: 9 (ref 5–15)
BUN: 58 mg/dL — ABNORMAL HIGH (ref 8–23)
CO2: 16 mmol/L — ABNORMAL LOW (ref 22–32)
Calcium: 8.4 mg/dL — ABNORMAL LOW (ref 8.9–10.3)
Chloride: 114 mmol/L — ABNORMAL HIGH (ref 98–111)
Creatinine, Ser: 2.7 mg/dL — ABNORMAL HIGH (ref 0.61–1.24)
GFR calc Af Amer: 23 mL/min — ABNORMAL LOW (ref >60)
GFR calc non Af Amer: 20 mL/min — ABNORMAL LOW (ref >60)
Glucose, Bld: 132 mg/dL — ABNORMAL HIGH (ref 70–99)
Potassium: 4.5 mmol/L (ref 3.5–5.1)
Sodium: 139 mmol/L (ref 135–145)
Total Bilirubin: 0.5 mg/dL (ref 0.3–1.2)
Total Protein: 7 g/dL (ref 6.5–8.1)

## 2019-01-07 LAB — PROTIME-INR
INR: 1.3 — ABNORMAL HIGH (ref 0.8–1.2)
Prothrombin Time: 16.4 seconds — ABNORMAL HIGH (ref 11.4–15.2)

## 2019-01-07 LAB — LACTIC ACID, PLASMA: Lactic Acid, Venous: 1.7 mmol/L (ref 0.5–1.9)

## 2019-01-07 LAB — C-REACTIVE PROTEIN: CRP: 17.8 mg/dL — ABNORMAL HIGH (ref <1.0)

## 2019-01-07 LAB — TRIGLYCERIDES: Triglycerides: 77 mg/dL (ref <150)

## 2019-01-07 LAB — LACTATE DEHYDROGENASE: LDH: 110 U/L (ref 98–192)

## 2019-01-07 LAB — PROCALCITONIN: Procalcitonin: 9.94 ng/mL

## 2019-01-07 LAB — FIBRINOGEN: Fibrinogen: 607 mg/dL — ABNORMAL HIGH (ref 210–475)

## 2019-01-07 LAB — FERRITIN: Ferritin: 169 ng/mL (ref 24–336)

## 2019-01-07 LAB — D-DIMER, QUANTITATIVE: D-Dimer, Quant: 0.66 ug/mL-FEU — ABNORMAL HIGH (ref 0.00–0.50)

## 2019-01-07 MED ORDER — LACTATED RINGERS IV BOLUS
1000.0000 mL | Freq: Once | INTRAVENOUS | Status: AC
  Administered 2019-01-07: 22:00:00 1000 mL via INTRAVENOUS

## 2019-01-07 MED ORDER — ACETAMINOPHEN 500 MG PO TABS
1000.0000 mg | ORAL_TABLET | Freq: Once | ORAL | Status: AC
  Administered 2019-01-07: 1000 mg via ORAL
  Filled 2019-01-07: qty 2

## 2019-01-07 NOTE — ED Provider Notes (Signed)
[facility] DEPT Provider Note   CSN: [id number] Arrival date & time: 01/07/19  2052     History   Chief Complaint Chief Complaint  Patient presents with   Fever    HPI Manuel King is a 85 y.o. male.     EMS states that they were called out for fever, altered mental status.  Patient had been less conversational today.  EMS noticed fever and given Tylenol.  Patient overall does not have any specific complaints.  There has been cases of coronavirus in the facility that patient lives.  Has history of dementia.  Denies any abdominal pain, chest pain, shortness of breath.  The history is provided by the patient.  Fever Max temp prior to arrival:  102 Temp source:  Oral Severity:  Mild Onset quality:  Gradual Timing:  Constant Progression:  Unchanged Chronicity:  New Relieved by:  Nothing Worsened by:  Nothing Associated symptoms: no chest pain, no chills, no cough, no diarrhea, no dysuria, no ear pain, no nausea, no rash, no sore throat and no vomiting   Risk factors: sick contacts   Risk factors: no recent sickness     Past Medical History:  Diagnosis Date   Arthritis    Cellulitis currently   right foot   CVA (cerebral vascular accident) ([place])    Dementia ([place])    Hypertension    Prostate cancer [facility])     Patient Active Problem List   Diagnosis Date Noted   HLD (hyperlipidemia) 08/18/2017   Dementia ([place]) 08/18/2017   Iron deficiency anemia 08/18/2017   Acute renal failure superimposed on stage 3 chronic kidney disease ([place]) 08/18/2017   Sepsis ([place]) 08/18/2017   Urinary urgency 01/25/2015   Ambulatory dysfunction 12/21/2014   Generalized weakness 07/18/2014   Fall 07/18/2014   Malnutrition of moderate degree ([place]) 02/03/2014   Bradycardia 02/01/2014   Gout 09/16/2011   AKI (acute kidney injury) ([place]) 09/12/2011   Falls 09/12/2011   Acute parotitis 05/10/2011   History of CVA (cerebrovascular  accident) 05/10/2011   Vascular dementia ([place]) 05/10/2011   SLEEP APNEA 11/16/2008   BRADYCARDIA 09/29/2008   FATIGUE, CHRONIC 09/29/2008   Essential hypertension 09/28/2008   PROSTATE CANCER, HX OF 09/28/2008    Past Surgical History:  Procedure Laterality Date   ANTERIOR CERVICAL DECOMP/DISCECTOMY FUSION  05/29/1999   Anterior C3 and C4 diskectomy, decompression of the spinal cord,bone bank graft, Synthes plate and microscope/notes 09/17/2010   CARPAL TUNNEL RELEASE Left 09/27/1999   /notes 09/17/2010   CARPAL TUNNEL RELEASE Right 08/12/2000   Archie Endo 09/17/2010   FOOT SURGERY     Partial excision left medial cuneiform.; Partial excision left first metatarsal./notes 09/17/2010   INSERTION PROSTATE RADIATION SEED  09/2005   Archie Endo 09/17/2010   NECK SURGERY     "BOIL ON MY NECK"   spider bite surgery     on left leg        Home Medications    Prior to Admission medications   Medication Sig Start Date End Date Taking? Authorizing Provider  acetaminophen (TYLENOL) 500 MG tablet Take 1,000 mg by mouth 2 (two) times daily.   Yes [provider]  allopurinol (ZYLOPRIM) 100 MG tablet Take 100 mg by mouth daily.   Yes [provider]  amLODipine (NORVASC) 5 MG tablet Take 5 mg by mouth daily.   Yes [provider]  divalproex (DEPAKOTE) 250 MG DR tablet Take 250-500 mg by mouth See admin instructions. Take 250 mg  tablet in the am and 500 mg  tablets in the pm   Yes [provider]  docusate sodium (COLACE) 100 MG capsule Take 100 mg by mouth daily.   Yes [provider]  donepezil (ARICEPT) 10 MG tablet Take 10 mg by mouth at bedtime.   Yes [provider]  ferrous sulfate 325 (65 FE) MG EC tablet Take 325 mg by mouth 2 (two) times daily.   Yes [provider]  hydrALAZINE (APRESOLINE) 25 MG tablet Take 1 tablet (25 mg total) by mouth every 8 (eight) hours. 12/25/14  Yes Theodis Blaze, MD  hydrochlorothiazide  (HYDRODIURIL) 12.5 MG tablet Take 12.5 mg by mouth daily. 07/24/17  Yes [provider]  irbesartan (AVAPRO) 150 MG tablet Take 150 mg by mouth daily. 07/24/17  Yes [provider]  oxybutynin (DITROPAN) 5 MG tablet Take 5 mg by mouth 3 (three) times daily.   Yes [provider]  PARoxetine (PAXIL) 20 MG tablet Take 20 mg by mouth daily.   Yes [provider]  Polyethyl Glycol-Propyl Glycol (SYSTANE) 0.4-0.3 % SOLN Place 2 drops into both eyes 2 (two) times daily.    Yes [provider]  pravastatin (PRAVACHOL) 40 MG tablet Take 40 mg by mouth at bedtime.    Yes [provider]  traMADol (ULTRAM) 50 MG tablet Take 50 mg by mouth daily. 07/20/17  Yes [provider]  amoxicillin-clavulanate (AUGMENTIN) 875-125 MG tablet Take 1 tablet by mouth every 12 (twelve) hours. Patient not taking: Reported on 01/07/2019 08/24/17   Patrecia Pour, MD    Family History Family History  Problem Relation Age of Onset   Hypotension Mother    Diabetes type II Mother    CAD Mother    Hypotension Father    CAD Father    CAD Sister     Social History Social History   Tobacco Use   Smoking status: Former Smoker    Years: 15.00   Smokeless tobacco: Never Used  Substance Use Topics   Alcohol use: No    Alcohol/week: 1.0 standard drinks    Types: 1 Glasses of wine per week    Comment: none in 2 years or more   Drug use: No     Allergies   Patient has no known allergies.   Review of Systems Review of Systems  Constitutional: Positive for fever. Negative for chills.  HENT: Negative for ear pain and sore throat.   Eyes: Negative for pain and visual disturbance.  Respiratory: Negative for cough, shortness of breath, wheezing and stridor.   Cardiovascular: Negative for chest pain and palpitations.  Gastrointestinal: Negative for abdominal pain, diarrhea, nausea and vomiting.  Genitourinary: Negative for dysuria and hematuria.   Musculoskeletal: Negative for arthralgias and back pain.  Skin: Negative for color change and rash.  Neurological: Negative for seizures and syncope.  All other systems reviewed and are negative.    Physical Exam Updated Vital Signs  ED Triage Vitals  Enc Vitals Group     BP 01/07/19 2108 (!) 136/56     Pulse Rate 01/07/19 2108 (!) 106     Resp 01/07/19 2108 (!) 26     Temp 01/07/19 2108 (!) 102.6 F (39.2 C)     Temp Source 01/07/19 2108 Oral     SpO2 01/07/19 2108 95 %     Weight 01/07/19 2217 219 lb 9.3 oz (99.6 kg)     Height 01/07/19 2217 6\' 4"  (1.93 m)  Head Circumference --      Peak Flow --      Pain Score 01/07/19 2328 0     Pain Loc --      Pain Edu? --      Excl. in [place]? --     Physical Exam Vitals signs and nursing note reviewed.  Constitutional:      General: He is not in acute distress.    Appearance: He is well-developed. He is not ill-appearing.  HENT:     Head: Normocephalic and atraumatic.     Nose: Nose normal.     Mouth/Throat:     Mouth: Mucous membranes are moist.  Eyes:     Extraocular Movements: Extraocular movements intact.     Conjunctiva/sclera: Conjunctivae normal.     Pupils: Pupils are equal, round, and reactive to light.  Neck:     Musculoskeletal: Normal range of motion and neck supple.  Cardiovascular:     Rate and Rhythm: Normal rate and regular rhythm.     Pulses: Normal pulses.     Heart sounds: Normal heart sounds. No murmur.  Pulmonary:     Effort: Pulmonary effort is normal. No respiratory distress.     Breath sounds: Normal breath sounds.  Abdominal:     Palpations: Abdomen is soft.     Tenderness: There is no abdominal tenderness.  Musculoskeletal: Normal range of motion.        General: No tenderness.  Skin:    General: Skin is warm and dry.     Capillary Refill: Capillary refill takes less than 2 seconds.  Neurological:     General: No focal deficit present.     Mental Status: He is alert and oriented to  person, place, and time.     Cranial Nerves: No cranial nerve deficit.     Sensory: No sensory deficit.  Psychiatric:        Mood and Affect: Mood normal.      ED Treatments / Results  Labs (all labs ordered are listed, but only abnormal results are displayed) Labs Reviewed  COMPREHENSIVE METABOLIC PANEL - Abnormal; Notable for the following components:      Result Value   Chloride 114 (*)    CO2 16 (*)    Glucose, Bld 132 (*)    BUN 58 (*)    Creatinine, Ser 2.70 (*)    Calcium 8.4 (*)    Albumin 3.0 (*)    GFR calc non Af Amer 20 (*)    GFR calc Af Amer 23 (*)    All other components within normal limits  CBC WITH DIFFERENTIAL/PLATELET - Abnormal; Notable for the following components:   WBC 13.4 (*)    RBC 3.19 (*)    Hemoglobin 9.4 (*)    HCT 29.1 (*)    RDW 15.9 (*)    Neutro Abs 10.5 (*)    Monocytes Absolute 1.7 (*)    Abs Immature Granulocytes 0.11 (*)    All other components within normal limits  PROTIME-INR - Abnormal; Notable for the following components:   Prothrombin Time 16.4 (*)    INR 1.3 (*)    All other components within normal limits  D-DIMER, QUANTITATIVE (NOT AT [facility]) - Abnormal; Notable for the following components:   D-Dimer, Quant 0.66 (*)    All other components within normal limits  FIBRINOGEN - Abnormal; Notable for the following components:   Fibrinogen 607 (*)    All other components within normal limits  C-REACTIVE  PROTEIN - Abnormal; Notable for the following components:   CRP 17.8 (*)    All other components within normal limits  SARS CORONAVIRUS 2 (HOSPITAL ORDER, PERFORMED IN [facility])  CULTURE, BLOOD (ROUTINE X 2)  CULTURE, BLOOD (ROUTINE X 2)  URINE CULTURE  LACTIC ACID, PLASMA  PROCALCITONIN  LACTATE DEHYDROGENASE  FERRITIN  TRIGLYCERIDES  LACTIC ACID, PLASMA  URINALYSIS, ROUTINE W REFLEX MICROSCOPIC    EKG EKG Interpretation  Date/Time:  Friday January 07 2019 21:10:14 EDT Ventricular Rate:   109 PR Interval:    QRS Duration: 74 QT Interval:  330 QTC Calculation: 445 R Axis:   54 Text Interpretation:  Sinus tachycardia Confirmed by Lennice Sites [phone]) on 01/07/2019 10:37:30 PM   Radiology Dg Chest Port 1 View  Result Date: 01/07/2019 CLINICAL DATA:  Fever EXAM: PORTABLE CHEST 1 VIEW COMPARISON:  08/18/2017 FINDINGS: Low lung volumes. Patchy airspace opacity at the right infrahilar lung. Borderline cardiomegaly with aortic atherosclerosis. No pneumothorax. IMPRESSION: Low lung volumes. Patchy atelectasis or small infiltrate at the right infrahilar lung Electronically Signed   By: Donavan Foil M.D.   On: 01/07/2019 21:52    Procedures .Critical Care Performed by: Lennice Sites, DO Authorized by: Lennice Sites, DO   Critical care provider statement:    Critical care time (minutes):  40   Critical care was necessary to treat or prevent imminent or life-threatening deterioration of the following conditions:  Sepsis and renal failure   Critical care was time spent personally by me on the following activities:  Blood draw for specimens, development of treatment plan with patient or surrogate, discussions with primary provider, evaluation of patient's response to treatment, obtaining history from patient or surrogate, ordering and performing treatments and interventions, ordering and review of laboratory studies, pulse oximetry, ordering and review of radiographic studies, re-evaluation of patient's condition and review of old charts   I assumed direction of critical care for this patient from another provider in my specialty: no     (including critical care time)  Medications Ordered in ED Medications  cefTRIAXone (ROCEPHIN) 1 g in sodium chloride 0.9 % 100 mL IVPB (has no administration in time range)  azithromycin (ZITHROMAX) 500 mg in sodium chloride 0.9 % 250 mL IVPB (has no administration in time range)  lactated ringers infusion (has no administration in time range)   acetaminophen (TYLENOL) tablet 1,000 mg (1,000 mg Oral Given 01/07/19 2216)  lactated ringers bolus 1,000 mL (1,000 mLs Intravenous New Bag/Given 01/07/19 2216)     Initial Impression / Assessment and Plan / ED Course  I have reviewed the triage vital signs and the nursing notes.  Pertinent labs & imaging results that were available during my care of the patient were reviewed by me and considered in my medical decision making (see chart for details).     ESSIAH PASSARIELLO is an 85 year old male with history of dementia who presents to the ED with fever.  Patient with fever of 102.6 at facility.  Given Tylenol.  Has had generalized weakness and has had increased weakness.  There has been coronavirus in the facility.  Patient overall pleasantly demented.  No specific symptoms.  Patient is febrile and tachycardic and concern for sepsis.  Possibly coronavirus.  Infectious work-up showed leukocytosis.  Possible pneumonia.  Coronavirus test is negative.  Urinalysis is still pending.  Patient appears to have an AKI.  However, normal lactic acid.  Patient given a Zithromax and Rocephin to cover for possible pneumonia/UTI.  Patient was given lactated ringer bolus.  Started on lactated Ringer infusion.  D-dimer is age-adjusted normal.  Overall patient is hemodynamically stable but appears to have vitals and lab work consistent with possibly sepsis.  Will admit to medicine for further care.  Suspect pneumonia versus urinary source.  This chart was dictated using voice recognition software.  Despite best efforts to proofread,  errors can occur which can change the documentation meaning.    Final Clinical Impressions(s) / ED Diagnoses   Final diagnoses:  Sepsis, due to unspecified organism, unspecified whether acute organ dysfunction present [facility])  Community acquired pneumonia, unspecified laterality    ED Discharge Orders    None       Lennice Sites, DO 01/08/19 [phone]

## 2019-01-07 NOTE — ED Notes (Signed)
Unsuccessful in and out attempt. EDP made aware. Will place condom cath on patient to obtain UA and culture. Will continue to monitor patient.

## 2019-01-07 NOTE — ED Triage Notes (Signed)
Per EMS: Pt is coming from [facility] with complaints of a fever with the highest reported from facility at 102.6. EMS administered 1000 mg of tylenol. Pt has reported having generalized weakness and at baseline the pt is able to ambulate with a walker but has not been able to do so. The pt also has a hx of dementia and according to facility the patient has been less verbal than normal. Pt has chronic kidney disease and normally has blood in his urine.   CBG 137 BP 136/76 HR 110 RR 25 SPO2 97

## 2019-01-08 ENCOUNTER — Encounter (HOSPITAL_COMMUNITY): Payer: Self-pay | Admitting: Internal Medicine

## 2019-01-08 ENCOUNTER — Inpatient Hospital Stay (HOSPITAL_COMMUNITY): Payer: Medicare Other

## 2019-01-08 DIAGNOSIS — D509 Iron deficiency anemia, unspecified: Secondary | ICD-10-CM | POA: Diagnosis present

## 2019-01-08 DIAGNOSIS — N390 Urinary tract infection, site not specified: Secondary | ICD-10-CM

## 2019-01-08 DIAGNOSIS — A419 Sepsis, unspecified organism: Secondary | ICD-10-CM

## 2019-01-08 DIAGNOSIS — Z7401 Bed confinement status: Secondary | ICD-10-CM | POA: Diagnosis not present

## 2019-01-08 DIAGNOSIS — Z833 Family history of diabetes mellitus: Secondary | ICD-10-CM | POA: Diagnosis not present

## 2019-01-08 DIAGNOSIS — R319 Hematuria, unspecified: Secondary | ICD-10-CM | POA: Diagnosis not present

## 2019-01-08 DIAGNOSIS — Z20828 Contact with and (suspected) exposure to other viral communicable diseases: Secondary | ICD-10-CM | POA: Diagnosis present

## 2019-01-08 DIAGNOSIS — Z8249 Family history of ischemic heart disease and other diseases of the circulatory system: Secondary | ICD-10-CM | POA: Diagnosis not present

## 2019-01-08 DIAGNOSIS — Z79899 Other long term (current) drug therapy: Secondary | ICD-10-CM | POA: Diagnosis not present

## 2019-01-08 DIAGNOSIS — N183 Chronic kidney disease, stage 3 (moderate): Secondary | ICD-10-CM | POA: Diagnosis present

## 2019-01-08 DIAGNOSIS — I69351 Hemiplegia and hemiparesis following cerebral infarction affecting right dominant side: Secondary | ICD-10-CM | POA: Diagnosis not present

## 2019-01-08 DIAGNOSIS — R6520 Severe sepsis without septic shock: Secondary | ICD-10-CM

## 2019-01-08 DIAGNOSIS — F329 Major depressive disorder, single episode, unspecified: Secondary | ICD-10-CM | POA: Diagnosis present

## 2019-01-08 DIAGNOSIS — E785 Hyperlipidemia, unspecified: Secondary | ICD-10-CM | POA: Diagnosis present

## 2019-01-08 DIAGNOSIS — F0150 Vascular dementia without behavioral disturbance: Secondary | ICD-10-CM | POA: Diagnosis present

## 2019-01-08 DIAGNOSIS — J189 Pneumonia, unspecified organism: Secondary | ICD-10-CM

## 2019-01-08 DIAGNOSIS — Z79891 Long term (current) use of opiate analgesic: Secondary | ICD-10-CM | POA: Diagnosis not present

## 2019-01-08 DIAGNOSIS — Z87891 Personal history of nicotine dependence: Secondary | ICD-10-CM | POA: Diagnosis not present

## 2019-01-08 DIAGNOSIS — M109 Gout, unspecified: Secondary | ICD-10-CM | POA: Diagnosis present

## 2019-01-08 DIAGNOSIS — A4159 Other Gram-negative sepsis: Secondary | ICD-10-CM | POA: Diagnosis present

## 2019-01-08 DIAGNOSIS — Z8673 Personal history of transient ischemic attack (TIA), and cerebral infarction without residual deficits: Secondary | ICD-10-CM

## 2019-01-08 DIAGNOSIS — M1990 Unspecified osteoarthritis, unspecified site: Secondary | ICD-10-CM | POA: Diagnosis present

## 2019-01-08 DIAGNOSIS — R32 Unspecified urinary incontinence: Secondary | ICD-10-CM | POA: Diagnosis present

## 2019-01-08 DIAGNOSIS — N179 Acute kidney failure, unspecified: Secondary | ICD-10-CM | POA: Diagnosis not present

## 2019-01-08 DIAGNOSIS — Z981 Arthrodesis status: Secondary | ICD-10-CM | POA: Diagnosis not present

## 2019-01-08 DIAGNOSIS — I129 Hypertensive chronic kidney disease with stage 1 through stage 4 chronic kidney disease, or unspecified chronic kidney disease: Secondary | ICD-10-CM | POA: Diagnosis present

## 2019-01-08 DIAGNOSIS — G894 Chronic pain syndrome: Secondary | ICD-10-CM | POA: Diagnosis not present

## 2019-01-08 DIAGNOSIS — I10 Essential (primary) hypertension: Secondary | ICD-10-CM

## 2019-01-08 DIAGNOSIS — M2550 Pain in unspecified joint: Secondary | ICD-10-CM | POA: Diagnosis not present

## 2019-01-08 DIAGNOSIS — Z8546 Personal history of malignant neoplasm of prostate: Secondary | ICD-10-CM | POA: Diagnosis not present

## 2019-01-08 LAB — CBC WITH DIFFERENTIAL/PLATELET
Abs Immature Granulocytes: 0.09 10*3/uL — ABNORMAL HIGH (ref 0.00–0.07)
Basophils Absolute: 0 10*3/uL (ref 0.0–0.1)
Basophils Relative: 0 %
Eosinophils Absolute: 0 10*3/uL (ref 0.0–0.5)
Eosinophils Relative: 0 %
HCT: 28.5 % — ABNORMAL LOW (ref 39.0–52.0)
Hemoglobin: 9 g/dL — ABNORMAL LOW (ref 13.0–17.0)
Immature Granulocytes: 1 %
Lymphocytes Relative: 5 %
Lymphs Abs: 0.7 10*3/uL (ref 0.7–4.0)
MCH: 28.8 pg (ref 26.0–34.0)
MCHC: 31.6 g/dL (ref 30.0–36.0)
MCV: 91.1 fL (ref 80.0–100.0)
Monocytes Absolute: 1.5 10*3/uL — ABNORMAL HIGH (ref 0.1–1.0)
Monocytes Relative: 11 %
Neutro Abs: 10.8 10*3/uL — ABNORMAL HIGH (ref 1.7–7.7)
Neutrophils Relative %: 83 %
Platelets: 171 10*3/uL (ref 150–400)
RBC: 3.13 MIL/uL — ABNORMAL LOW (ref 4.22–5.81)
RDW: 16 % — ABNORMAL HIGH (ref 11.5–15.5)
WBC: 13.1 10*3/uL — ABNORMAL HIGH (ref 4.0–10.5)
nRBC: 0 % (ref 0.0–0.2)

## 2019-01-08 LAB — URINALYSIS, ROUTINE W REFLEX MICROSCOPIC
Bilirubin Urine: NEGATIVE
Glucose, UA: NEGATIVE mg/dL
Ketones, ur: NEGATIVE mg/dL
Nitrite: POSITIVE — AB
Protein, ur: 100 mg/dL — AB
Specific Gravity, Urine: 1.011 (ref 1.005–1.030)
WBC, UA: >50 WBC/hpf — ABNORMAL HIGH (ref 0–5)
pH: 6 (ref 5.0–8.0)

## 2019-01-08 LAB — COMPREHENSIVE METABOLIC PANEL
ALT: 12 U/L (ref 0–44)
AST: 16 U/L (ref 15–41)
Albumin: 2.7 g/dL — ABNORMAL LOW (ref 3.5–5.0)
Alkaline Phosphatase: 56 U/L (ref 38–126)
Anion gap: 12 (ref 5–15)
BUN: 51 mg/dL — ABNORMAL HIGH (ref 8–23)
CO2: 17 mmol/L — ABNORMAL LOW (ref 22–32)
Calcium: 8.3 mg/dL — ABNORMAL LOW (ref 8.9–10.3)
Chloride: 110 mmol/L (ref 98–111)
Creatinine, Ser: 2.3 mg/dL — ABNORMAL HIGH (ref 0.61–1.24)
GFR calc Af Amer: 29 mL/min — ABNORMAL LOW (ref >60)
GFR calc non Af Amer: 25 mL/min — ABNORMAL LOW (ref >60)
Glucose, Bld: 113 mg/dL — ABNORMAL HIGH (ref 70–99)
Potassium: 4.3 mmol/L (ref 3.5–5.1)
Sodium: 139 mmol/L (ref 135–145)
Total Bilirubin: 0.6 mg/dL (ref 0.3–1.2)
Total Protein: 6.4 g/dL — ABNORMAL LOW (ref 6.5–8.1)

## 2019-01-08 LAB — PROCALCITONIN: Procalcitonin: 7.91 ng/mL

## 2019-01-08 LAB — STREP PNEUMONIAE URINARY ANTIGEN: Strep Pneumo Urinary Antigen: NEGATIVE

## 2019-01-08 LAB — LACTIC ACID, PLASMA: Lactic Acid, Venous: 1.7 mmol/L (ref 0.5–1.9)

## 2019-01-08 LAB — MRSA PCR SCREENING: MRSA by PCR: NEGATIVE

## 2019-01-08 LAB — SARS CORONAVIRUS 2 BY RT PCR (HOSPITAL ORDER, PERFORMED IN ~~LOC~~ HOSPITAL LAB): SARS Coronavirus 2: NEGATIVE

## 2019-01-08 LAB — VALPROIC ACID LEVEL: Valproic Acid Lvl: 31 ug/mL — ABNORMAL LOW (ref 50.0–100.0)

## 2019-01-08 MED ORDER — DIVALPROEX SODIUM 250 MG PO DR TAB
500.0000 mg | DELAYED_RELEASE_TABLET | Freq: Every day | ORAL | Status: DC
  Administered 2019-01-08 – 2019-01-10 (×3): 500 mg via ORAL
  Filled 2019-01-08 (×3): qty 2

## 2019-01-08 MED ORDER — PRAVASTATIN SODIUM 40 MG PO TABS
40.0000 mg | ORAL_TABLET | Freq: Every day | ORAL | Status: DC
  Administered 2019-01-08 – 2019-01-10 (×3): 40 mg via ORAL
  Filled 2019-01-08 (×3): qty 1

## 2019-01-08 MED ORDER — ONDANSETRON HCL 4 MG/2ML IJ SOLN: 4.0000 mg | Freq: Four times a day (QID) | INTRAMUSCULAR | Status: DC | PRN

## 2019-01-08 MED ORDER — SODIUM CHLORIDE 0.9 % IV SOLN
1.0000 g | Freq: Once | INTRAVENOUS | Status: AC
  Administered 2019-01-08: 1 g via INTRAVENOUS
  Filled 2019-01-08: qty 10

## 2019-01-08 MED ORDER — ACETAMINOPHEN 650 MG RE SUPP: 650.0000 mg | Freq: Four times a day (QID) | RECTAL | Status: DC | PRN

## 2019-01-08 MED ORDER — POLYVINYL ALCOHOL 1.4 % OP SOLN
2.0000 [drp] | Freq: Two times a day (BID) | OPHTHALMIC | Status: DC
  Administered 2019-01-08 – 2019-01-11 (×6): 2 [drp] via OPHTHALMIC
  Filled 2019-01-08: qty 15

## 2019-01-08 MED ORDER — SODIUM CHLORIDE 0.9 % IV SOLN
500.0000 mg | INTRAVENOUS | Status: DC
  Administered 2019-01-08 – 2019-01-10 (×3): 500 mg via INTRAVENOUS
  Filled 2019-01-08 (×3): qty 500

## 2019-01-08 MED ORDER — PAROXETINE HCL 20 MG PO TABS
20.0000 mg | ORAL_TABLET | Freq: Every day | ORAL | Status: DC
  Administered 2019-01-08 – 2019-01-11 (×4): 20 mg via ORAL
  Filled 2019-01-08 (×4): qty 1

## 2019-01-08 MED ORDER — AMLODIPINE BESYLATE 5 MG PO TABS
5.0000 mg | ORAL_TABLET | Freq: Every day | ORAL | Status: DC
  Administered 2019-01-08 – 2019-01-10 (×3): 5 mg via ORAL
  Filled 2019-01-08 (×3): qty 1

## 2019-01-08 MED ORDER — FERROUS SULFATE 325 (65 FE) MG PO TABS
325.0000 mg | ORAL_TABLET | Freq: Two times a day (BID) | ORAL | Status: DC
  Administered 2019-01-08 – 2019-01-11 (×7): 325 mg via ORAL
  Filled 2019-01-08 (×8): qty 1

## 2019-01-08 MED ORDER — DIVALPROEX SODIUM 250 MG PO DR TAB: 250.0000 mg | DELAYED_RELEASE_TABLET | ORAL | Status: DC

## 2019-01-08 MED ORDER — SODIUM CHLORIDE 0.9 % IV SOLN
500.0000 mg | Freq: Once | INTRAVENOUS | Status: AC
  Administered 2019-01-08: 500 mg via INTRAVENOUS
  Filled 2019-01-08: qty 500

## 2019-01-08 MED ORDER — SODIUM CHLORIDE 0.9 % IV SOLN
INTRAVENOUS | Status: DC
  Administered 2019-01-08 – 2019-01-10 (×5): via INTRAVENOUS

## 2019-01-08 MED ORDER — HYDRALAZINE HCL 25 MG PO TABS
25.0000 mg | ORAL_TABLET | Freq: Three times a day (TID) | ORAL | Status: DC
  Administered 2019-01-08 – 2019-01-11 (×11): 25 mg via ORAL
  Filled 2019-01-08 (×13): qty 1

## 2019-01-08 MED ORDER — DIVALPROEX SODIUM 250 MG PO DR TAB
250.0000 mg | DELAYED_RELEASE_TABLET | Freq: Every day | ORAL | Status: DC
  Administered 2019-01-09 – 2019-01-11 (×3): 250 mg via ORAL
  Filled 2019-01-08 (×3): qty 1

## 2019-01-08 MED ORDER — ONDANSETRON HCL 4 MG PO TABS: 4.0000 mg | ORAL_TABLET | Freq: Four times a day (QID) | ORAL | Status: DC | PRN

## 2019-01-08 MED ORDER — HYDRALAZINE HCL 20 MG/ML IJ SOLN
10.0000 mg | INTRAMUSCULAR | Status: DC | PRN
  Filled 2019-01-08: qty 1

## 2019-01-08 MED ORDER — ACETAMINOPHEN 325 MG PO TABS
650.0000 mg | ORAL_TABLET | Freq: Four times a day (QID) | ORAL | Status: DC | PRN
  Administered 2019-01-08 – 2019-01-10 (×4): 650 mg via ORAL
  Filled 2019-01-08 (×4): qty 2

## 2019-01-08 MED ORDER — DONEPEZIL HCL 10 MG PO TABS
10.0000 mg | ORAL_TABLET | Freq: Every day | ORAL | Status: DC
  Administered 2019-01-08 – 2019-01-10 (×3): 10 mg via ORAL
  Filled 2019-01-08 (×3): qty 1

## 2019-01-08 MED ORDER — OXYBUTYNIN CHLORIDE 5 MG PO TABS
5.0000 mg | ORAL_TABLET | Freq: Three times a day (TID) | ORAL | Status: DC
  Administered 2019-01-08 – 2019-01-11 (×11): 5 mg via ORAL
  Filled 2019-01-08 (×11): qty 1

## 2019-01-08 MED ORDER — SODIUM CHLORIDE 0.9% IV SOLUTION: Freq: Once | INTRAVENOUS | Status: DC

## 2019-01-08 MED ORDER — LACTATED RINGERS IV SOLN
INTRAVENOUS | Status: DC
  Administered 2019-01-08 (×2): via INTRAVENOUS

## 2019-01-08 MED ORDER — SODIUM CHLORIDE 0.9 % IV SOLN
2.0000 g | INTRAVENOUS | Status: DC
  Administered 2019-01-08 – 2019-01-10 (×3): 2 g via INTRAVENOUS
  Filled 2019-01-08 (×3): qty 2

## 2019-01-08 MED ORDER — TRAMADOL HCL 50 MG PO TABS
50.0000 mg | ORAL_TABLET | Freq: Every day | ORAL | Status: DC
  Administered 2019-01-08 – 2019-01-11 (×4): 50 mg via ORAL
  Filled 2019-01-08 (×4): qty 1

## 2019-01-08 MED ORDER — DOCUSATE SODIUM 100 MG PO CAPS
100.0000 mg | ORAL_CAPSULE | Freq: Every day | ORAL | Status: DC
  Administered 2019-01-08 – 2019-01-11 (×4): 100 mg via ORAL
  Filled 2019-01-08 (×4): qty 1

## 2019-01-08 MED ORDER — ALLOPURINOL 100 MG PO TABS
100.0000 mg | ORAL_TABLET | Freq: Every day | ORAL | Status: DC
  Administered 2019-01-08 – 2019-01-11 (×4): 100 mg via ORAL
  Filled 2019-01-08 (×4): qty 1

## 2019-01-08 NOTE — Progress Notes (Signed)
I have seen and assessed patient and agree with Dr. Moise Boring assessment and plan.  A 85 year old gentleman history of dementia, history of CVA with right-sided weakness, hypertension, chronic kidney disease stage III, history of prostate cancer presented to the ED with a fever of 102, generalized weakness.  Patient noted in the ED to have a temperature 102, noted to be tachycardic with elevated procalcitonin levels.  Urinalysis consistent with a UTI.  CT abdomen and pelvis done concerning for cystitis.  Chest x-ray done concerning for atelectasis versus infiltrate.  Patient also noted to be in acute renal failure.  COVID-19 test was negative.  Patient admitted with sepsis felt secondary to UTI and probable pneumonia.  Continue empiric IV antibiotics, IV fluids, supportive care.  Follow.  No charge.

## 2019-01-08 NOTE — ED Notes (Signed)
ED TO INPATIENT HANDOFF REPORT  Name/Age/Gender Manuel King 85 y.o. male  Code Status    Code Status Orders  (From admission, onward)         Start     Ordered   01/08/19 0246  Full code  Continuous     01/08/19 0245        Code Status History    Date Active Date Inactive Code Status Order ID Comments User Context   08/18/2017 2359 08/24/2017 1825 Full Code [id number]  Ivor Costa, MD ED   12/21/2014 1641 12/25/2014 1620 Full Code [id number]  Theodis Blaze, MD ED   02/02/2014 0017 02/04/2014 1510 Full Code [id number]  Toy Baker, MD Inpatient   09/12/2011 1825 09/17/2011 1716 Full Code [id number]  Kaylyn Layer, RN Inpatient   05/10/2011 1348 05/13/2011 1441 Full Code [id number]  Claiborne Billings, RN Inpatient   Advance Care Planning Activity      Home/SNF/Other Home  Chief Complaint fever  Level of Care/Admitting Diagnosis ED Disposition    ED Disposition Condition [facility] Area: [facility] [id number]  Level of Care: Telemetry [5]  Admit to tele based on following criteria: Monitor for Ischemic changes  Covid Evaluation: N/A  [facility]: [id number]  Admitting Physician: Rise Patience [phone]  Attending Physician: Rise Patience [phone]  Estimated length of stay: past midnight tomorrow  Certification:: I certify this patient will need inpatient services for at least 2 midnights  PT Class (Do Not Modify): Inpatient [101]  PT Acc Code (Do Not Modify): Private [1]       Medical History Past Medical History:  Diagnosis Date  . Arthritis   . Cellulitis currently   right foot  . CVA (cerebral vascular accident) ([place])   . Dementia ([place])   . Hypertension   . Prostate cancer ([place])     Allergies No Known Allergies  IV Location/Drains/Wounds Patient Lines/Drains/Airways Status   Active Line/Drains/Airways    Name:   Placement date:   Placement time:   Site:   Days:   Peripheral IV 01/07/19  Left Forearm   01/07/19    2138    Forearm   1   Peripheral IV 01/07/19 Right Forearm   01/07/19    2143    Forearm   1          Labs/Imaging Results for orders placed or performed during the hospital encounter of 01/07/19 (from the past 48 hour(s))  Comprehensive metabolic panel     Status: Abnormal   Collection Time: 01/07/19  9:29 PM  Result Value Ref Range   Sodium 139 135 - 145 mmol/L   Potassium 4.5 3.5 - 5.1 mmol/L   Chloride 114 (H) 98 - 111 mmol/L   CO2 16 (L) 22 - 32 mmol/L   Glucose, Bld 132 (H) 70 - 99 mg/dL   BUN 58 (H) 8 - 23 mg/dL   Creatinine, Ser 2.70 (H) 0.61 - 1.24 mg/dL   Calcium 8.4 (L) 8.9 - 10.3 mg/dL   Total Protein 7.0 6.5 - 8.1 g/dL   Albumin 3.0 (L) 3.5 - 5.0 g/dL   AST 16 15 - 41 U/L   ALT 12 0 - 44 U/L   Alkaline Phosphatase 59 38 - 126 U/L   Total Bilirubin 0.5 0.3 - 1.2 mg/dL   GFR calc non Af Amer 20 (L) >60 mL/min   GFR calc Af Amer 23 (L) >60 mL/min   Anion  gap 9 5 - 15    Comment: Performed at [facility], [place] [address] [place], [place] [address]  Lactic acid, plasma     Status: None   Collection Time: 01/07/19  9:29 PM  Result Value Ref Range   Lactic Acid, Venous 1.7 0.5 - 1.9 mmol/L    Comment: Performed at [facility], [place] [address] [place], [place] [address]  CBC with Differential     Status: Abnormal   Collection Time: 01/07/19  9:29 PM  Result Value Ref Range   WBC 13.4 (H) 4.0 - 10.5 K/uL   RBC 3.19 (L) 4.22 - 5.81 MIL/uL   Hemoglobin 9.4 (L) 13.0 - 17.0 g/dL   HCT 29.1 (L) 39.0 - 52.0 %   MCV 91.2 80.0 - 100.0 fL   MCH 29.5 26.0 - 34.0 pg   MCHC 32.3 30.0 - 36.0 g/dL   RDW 15.9 (H) 11.5 - 15.5 %   Platelets 183 150 - 400 K/uL   nRBC 0.0 0.0 - 0.2 %   Neutrophils Relative % 78 %   Neutro Abs 10.5 (H) 1.7 - 7.7 K/uL   Lymphocytes Relative 9 %   Lymphs Abs 1.1 0.7 - 4.0 K/uL   Monocytes Relative 12 %   Monocytes Absolute 1.7 (H) 0.1 - 1.0 K/uL   Eosinophils Relative 0 %    Eosinophils Absolute 0.0 0.0 - 0.5 K/uL   Basophils Relative 0 %   Basophils Absolute 0.0 0.0 - 0.1 K/uL   Immature Granulocytes 1 %   Abs Immature Granulocytes 0.11 (H) 0.00 - 0.07 K/uL    Comment: Performed at [facility], [place] [address] [place], [place] [address]  Protime-INR     Status: Abnormal   Collection Time: 01/07/19  9:29 PM  Result Value Ref Range   Prothrombin Time 16.4 (H) 11.4 - 15.2 seconds   INR 1.3 (H) 0.8 - 1.2    Comment: (NOTE) INR goal varies based on device and disease states. Performed at [facility], [place] [address], [place], [place] [address]   Culture, blood (Routine x 2)     Status: None (Preliminary result)   Collection Time: 01/07/19  9:30 PM   Specimen: BLOOD  Result Value Ref Range   Specimen Description      BLOOD BLOOD LEFT HAND Performed at [place] [address] [place], [place] [address]    Special Requests      BOTTLES DRAWN AEROBIC AND ANAEROBIC Blood Culture adequate volume Performed at [place] [address] [place], [place] [address]    Culture      NO GROWTH < 24 HOURS Performed at [place] [address], [place], [place] [address]    Report Status PENDING   Culture, blood (Routine x 2)     Status: None (Preliminary result)   Collection Time: 01/07/19  9:30 PM   Specimen: BLOOD  Result Value Ref Range   Specimen Description      BLOOD BLOOD RIGHT HAND Performed at [place] [address] [place], [place] [address]    Special Requests      BOTTLES DRAWN AEROBIC AND ANAEROBIC Blood Culture results may not be optimal due to an excessive volume of blood received in culture bottles Performed at [place] [address] [place],  [address]    Culture      NO GROWTH < 24 HOURS  Performed at [facility], [place] [address], [place], [place] [address]    Report Status PENDING    SARS Coronavirus 2 [facility] order, Performed in [facility] hospital lab) Nasopharyngeal Nasopharyngeal Swab     Status: None   Collection Time: 01/07/19  9:54 PM   Specimen: Nasopharyngeal Swab  Result Value Ref Range   SARS Coronavirus 2 NEGATIVE NEGATIVE    Comment: (NOTE) If result is NEGATIVE SARS-CoV-2 target nucleic acids are NOT DETECTED. The SARS-CoV-2 RNA is generally detectable in upper and lower  respiratory specimens during the acute phase of infection. The lowest  concentration of SARS-CoV-2 viral copies this assay can detect is 250  copies / mL. A negative result does not preclude SARS-CoV-2 infection  and should not be used as the sole basis for treatment or other  patient management decisions.  A negative result may occur with  improper specimen collection / handling, submission of specimen other  than nasopharyngeal swab, presence of viral mutation(s) within the  areas targeted by this assay, and inadequate number of viral copies  (<250 copies / mL). A negative result must be combined with clinical  observations, patient history, and epidemiological information. If result is POSITIVE SARS-CoV-2 target nucleic acids are DETECTED. The SARS-CoV-2 RNA is generally detectable in upper and lower  respiratory specimens dur ing the acute phase of infection.  Positive  results are indicative of active infection with SARS-CoV-2.  Clinical  correlation with patient history and other diagnostic information is  necessary to determine patient infection status.  Positive results do  not rule out bacterial infection or co-infection with other viruses. If result is PRESUMPTIVE POSTIVE SARS-CoV-2 nucleic acids MAY BE PRESENT.   A presumptive positive result was obtained on the submitted specimen  and confirmed on repeat testing.  While 2019 novel coronavirus  (SARS-CoV-2) nucleic acids may be present in the submitted sample  additional confirmatory testing may be necessary for  epidemiological  and / or clinical management purposes  to differentiate between  SARS-CoV-2 and other Sarbecovirus currently known to infect humans.  If clinically indicated additional testing with an alternate test  methodology [phone]) is advised. The SARS-CoV-2 RNA is generally  detectable in upper and lower respiratory sp ecimens during the acute  phase of infection. The expected result is Negative. Fact Sheet for Patients:  StrictlyIdeas.no Fact Sheet for Healthcare Providers: BankingDealers.co.za This test is not yet approved or cleared by the [place] FDA and has been authorized for detection and/or diagnosis of SARS-CoV-2 by FDA under an Emergency Use Authorization (EUA).  This EUA will remain in effect (meaning this test can be used) for the duration of the COVID-19 declaration under Section 564(b)(1) of the Act, 21 U.S.C. section 360bbb-3(b)(1), unless the authorization is terminated or revoked sooner. Performed at [facility], [place] [address], [place], [place] [address]   D-dimer, quantitative     Status: Abnormal   Collection Time: 01/07/19 10:07 PM  Result Value Ref Range   D-Dimer, Quant 0.66 (H) 0.00 - 0.50 ug/mL-FEU    Comment: (NOTE) At the manufacturer cut-off of 0.50 ug/mL FEU, this assay has been documented to exclude PE with a sensitivity and negative predictive value of 97 to 99%.  At this time, this assay has not been approved by the FDA to exclude DVT/VTE. Results should be correlated with clinical presentation. Performed at [facility], [place] [address] [place], [place] [address]   Procalcitonin     Status: None  Collection Time: 01/07/19 10:07 PM  Result Value Ref Range   Procalcitonin 9.94 ng/mL    Comment:        Interpretation: PCT > 2 ng/mL: Systemic infection (sepsis) is likely, unless other causes are known. (NOTE)       Sepsis PCT Algorithm            Lower Respiratory Tract                                      Infection PCT Algorithm    ----------------------------     ----------------------------         PCT < 0.25 ng/mL                PCT < 0.10 ng/mL         Strongly encourage             Strongly discourage   discontinuation of antibiotics    initiation of antibiotics    ----------------------------     -----------------------------       PCT 0.25 - 0.50 ng/mL            PCT 0.10 - 0.25 ng/mL               OR       >80% decrease in PCT            Discourage initiation of                                            antibiotics      Encourage discontinuation           of antibiotics    ----------------------------     -----------------------------         PCT >= 0.50 ng/mL              PCT 0.26 - 0.50 ng/mL               AND       <80% decrease in PCT              Encourage initiation of                                             antibiotics       Encourage continuation           of antibiotics    ----------------------------     -----------------------------        PCT >= 0.50 ng/mL                  PCT > 0.50 ng/mL               AND         increase in PCT                  Strongly encourage                                      initiation of antibiotics    Strongly encourage escalation  of antibiotics                                     -----------------------------                                           PCT <= 0.25 ng/mL                                                 OR                                        > 80% decrease in PCT                                     Discontinue / Do not initiate                                             antibiotics Performed at [place] [address], [place], [place] [address]   Lactate dehydrogenase     Status: None   Collection Time: 01/07/19 10:07 PM  Result Value Ref Range   LDH 110 98 - 192 U/L    Comment: Performed at [facility], [place] [address], [place], [place] [address]  Ferritin     Status: None   Collection Time: 01/07/19 10:07 PM  Result Value Ref Range   Ferritin 169 24 - 336 ng/mL    Comment: Performed at [facility], [place] [address], [place], [place] [address]  Triglycerides     Status: None   Collection Time: 01/07/19 10:07 PM  Result Value Ref Range   Triglycerides 77 <150 mg/dL    Comment: Performed at [facility], [place] [address] [place], [place] [address]  Fibrinogen     Status: Abnormal   Collection Time: 01/07/19 10:07 PM  Result Value Ref Range   Fibrinogen 607 (H) 210 - 475 mg/dL    Comment: Performed at [facility], [place] [address] [place] [address] [place], [place] [address]  C-reactive protein     Status: Abnormal   Collection Time: 01/07/19 10:07 PM  Result Value Ref Range   CRP 17.8 (H) <1.0 mg/dL    Comment: Performed at [facility], [place] [address] [place], [place] [address]  Lactic acid, plasma     Status: None   Collection Time: 01/07/19 11:14 PM  Result Value Ref Range   Lactic Acid, Venous 1.7 0.5 - 1.9 mmol/L    Comment: Performed at [facility], [place] [address], [place], [place] [address]  Comprehensive metabolic panel     Status: Abnormal   Collection Time: 01/08/19  2:46 AM  Result Value Ref Range   Sodium 139 135 - 145 mmol/L   Potassium 4.3 3.5 - 5.1 mmol/L   Chloride 110 98 - 111 mmol/L  CO2 17 (L) 22 - 32 mmol/L   Glucose, Bld 113 (H) 70 - 99 mg/dL   BUN 51 (H) 8 - 23 mg/dL   Creatinine, Ser 2.30 (H) 0.61 - 1.24 mg/dL   Calcium 8.3 (L) 8.9 - 10.3 mg/dL   Total Protein 6.4 (L) 6.5 - 8.1 g/dL   Albumin 2.7 (L) 3.5 - 5.0 g/dL   AST 16 15 - 41 U/L   ALT 12 0 - 44 U/L   Alkaline Phosphatase 56 38 - 126 U/L   Total Bilirubin 0.6 0.3 - 1.2 mg/dL   GFR calc non Af Amer 25 (L) >60 mL/min   GFR calc Af Amer 29 (L) >60 mL/min   Anion gap 12 5 - 15    Comment: Performed at  [facility], [place] [address], [place], [place] [address]  CBC WITH DIFFERENTIAL     Status: Abnormal   Collection Time: 01/08/19  2:46 AM  Result Value Ref Range   WBC 13.1 (H) 4.0 - 10.5 K/uL   RBC 3.13 (L) 4.22 - 5.81 MIL/uL   Hemoglobin 9.0 (L) 13.0 - 17.0 g/dL   HCT 28.5 (L) 39.0 - 52.0 %   MCV 91.1 80.0 - 100.0 fL   MCH 28.8 26.0 - 34.0 pg   MCHC 31.6 30.0 - 36.0 g/dL   RDW 16.0 (H) 11.5 - 15.5 %   Platelets 171 150 - 400 K/uL   nRBC 0.0 0.0 - 0.2 %   Neutrophils Relative % 83 %   Neutro Abs 10.8 (H) 1.7 - 7.7 K/uL   Lymphocytes Relative 5 %   Lymphs Abs 0.7 0.7 - 4.0 K/uL   Monocytes Relative 11 %   Monocytes Absolute 1.5 (H) 0.1 - 1.0 K/uL   Eosinophils Relative 0 %   Eosinophils Absolute 0.0 0.0 - 0.5 K/uL   Basophils Relative 0 %   Basophils Absolute 0.0 0.0 - 0.1 K/uL   Immature Granulocytes 1 %   Abs Immature Granulocytes 0.09 (H) 0.00 - 0.07 K/uL    Comment: Performed at [facility], [place] [address], [place], [place] [address]  Urinalysis, Routine w reflex microscopic     Status: Abnormal   Collection Time: 01/08/19  3:17 AM  Result Value Ref Range   Color, Urine YELLOW YELLOW   APPearance CLOUDY (A) CLEAR   Specific Gravity, Urine 1.011 1.005 - 1.030   pH 6.0 5.0 - 8.0   Glucose, UA NEGATIVE NEGATIVE mg/dL   Hgb urine dipstick SMALL (A) NEGATIVE   Bilirubin Urine NEGATIVE NEGATIVE   Ketones, ur NEGATIVE NEGATIVE mg/dL   Protein, ur 100 (A) NEGATIVE mg/dL   Nitrite POSITIVE (A) NEGATIVE   Leukocytes,Ua LARGE (A) NEGATIVE   RBC / HPF 11-20 0 - 5 RBC/hpf   WBC, UA >50 (H) 0 - 5 WBC/hpf   Bacteria, UA RARE (A) NONE SEEN   Squamous Epithelial / LPF 0-5 0 - 5   WBC Clumps PRESENT    Budding Yeast PRESENT     Comment: Performed at [facility], [place] [address] [place], [place] [address]  Valproic acid level     Status: Abnormal   Collection Time: 01/08/19  4:34 AM  Result Value Ref Range   Valproic Acid  Lvl 31 (L) 50.0 - 100.0 ug/mL    Comment: Performed at [facility], [place] [address] [place],  [address]   Dg Chest Port 1 View  Result Date: 01/07/2019 CLINICAL DATA:  Fever  EXAM: PORTABLE CHEST 1 VIEW COMPARISON:  08/18/2017 FINDINGS: Low lung volumes. Patchy airspace opacity at the right infrahilar lung. Borderline cardiomegaly with aortic atherosclerosis. No pneumothorax. IMPRESSION: Low lung volumes. Patchy atelectasis or small infiltrate at the right infrahilar lung Electronically Signed   By: Donavan Foil M.D.   On: 01/07/2019 21:52   Ct Renal Stone Study  Result Date: 01/08/2019 CLINICAL DATA:  Hematuria EXAM: CT ABDOMEN AND PELVIS WITHOUT CONTRAST TECHNIQUE: Multidetector CT imaging of the abdomen and pelvis was performed following the standard protocol without IV contrast. COMPARISON:  None. FINDINGS: Lower chest: There is mild cardiomegaly. No hiatal hernia. The visualized portions of the lungs are clear. Hepatobiliary: There is a 2.3 cm low-density lesion seen within the right liver lobe. No evidence of calcified gallstones or biliary ductal dilatation. Pancreas:  Unremarkable.  No surrounding inflammatory changes. Spleen: Normal in size. Although limited due to the lack of intravenous contrast, normal in appearance. Adrenals/Urinary Tract: Both adrenal glands appear normal. There is atrophy with bilateral perinephric stranding. No renal calculi are seen. No hydronephrosis. There appears to be mild bladder wall thickening with surrounding mild fat stranding changes. Stomach/Bowel: The stomach, small bowel, and colon are normal in appearance. No inflammatory changes or obstructive findings. There is a moderate amount of colonic stool. The appendix is not seen. Vascular/Lymphatic: There are no enlarged abdominal or pelvic lymph nodes. Scattered aortic atherosclerotic calcifications are seen without aneurysmal dilatation. Reproductive: Radiation seeds are seen within the  prostate. Other: Small bilateral fat containing inguinal hernias are seen. Musculoskeletal: No acute or significant osseous findings. Degenerative changes are seen in the lower lumbar spine is IMPRESSION: Mild diffuse bladder wall thickening with adjacent inflammatory changes. This could be due to cystitis. Please correlate with the patient's clinical laboratory exam. Bilateral renal cortical thinning with perinephric stranding. No hydronephrosis. Electronically Signed   By: Prudencio Pair M.D.   On: 01/08/2019 03:25    Pending Labs Unresulted Labs (From admission, onward)    Start     Ordered   01/08/19 0212  HIV antibody (Routine Screening)  Once,   STAT     01/08/19 0212   01/08/19 0212  Legionella Pneumophila Serogp 1 Ur Ag  Once,   STAT     01/08/19 0212   01/08/19 0212  Strep pneumoniae urinary antigen  Once,   STAT     01/08/19 0212   01/07/19 2208  Urine culture  ONCE - STAT,   STAT     01/07/19 2207          Vitals/Pain Today's Vitals   01/08/19 1300 01/08/19 1330 01/08/19 1400 01/08/19 1430  BP: 140/69 (!) 116/92 (!) 156/61 (!) 137/56  Pulse: 82 82 90 74  Resp: 18 (!) 22 13 20   Temp:      TempSrc:      SpO2: 97% 98% 98% 96%  Weight:      Height:      PainSc:        Isolation Precautions No active isolations  Medications Medications  lactated ringers infusion ( Intravenous Stopped 01/08/19 0415)  hydrALAZINE (APRESOLINE) injection 10 mg (has no administration in time range)  allopurinol (ZYLOPRIM) tablet 100 mg (100 mg Oral Given 01/08/19 1042)  traMADol (ULTRAM) tablet 50 mg (50 mg Oral Given 01/08/19 1043)  amLODipine (NORVASC) tablet 5 mg (5 mg Oral Given 01/08/19 1042)  hydrALAZINE (APRESOLINE) tablet 25 mg (25 mg Oral Given 01/08/19 0409)  pravastatin (PRAVACHOL) tablet 40 mg (has no administration in time  range)  donepezil (ARICEPT) tablet 10 mg (has no administration in time range)  PARoxetine (PAXIL) tablet 20 mg (20 mg Oral Given 01/08/19 1043)  docusate sodium  (COLACE) capsule 100 mg (100 mg Oral Given 01/08/19 1043)  oxybutynin (DITROPAN) tablet 5 mg (5 mg Oral Given 01/08/19 1043)  ferrous sulfate tablet 325 mg (325 mg Oral Given 01/08/19 0409)  divalproex (DEPAKOTE) DR tablet 250-500 mg (has no administration in time range)  polyvinyl alcohol (LIQUIFILM TEARS) 1.4 % ophthalmic solution 2 drop (2 drops Both Eyes Given 01/08/19 1044)  acetaminophen (TYLENOL) tablet 650 mg (650 mg Oral Given 01/08/19 1044)    Or  acetaminophen (TYLENOL) suppository 650 mg ( Rectal See Alternative 01/08/19 1044)  ondansetron (ZOFRAN) tablet 4 mg (has no administration in time range)    Or  ondansetron (ZOFRAN) injection 4 mg (has no administration in time range)  cefTRIAXone (ROCEPHIN) 2 g in sodium chloride 0.9 % 100 mL IVPB (has no administration in time range)  azithromycin (ZITHROMAX) 500 mg in sodium chloride 0.9 % 250 mL IVPB (has no administration in time range)  acetaminophen (TYLENOL) tablet 1,000 mg (1,000 mg Oral Given 01/07/19 2216)  lactated ringers bolus 1,000 mL (0 mLs Intravenous Stopped 01/08/19 0041)  cefTRIAXone (ROCEPHIN) 1 g in sodium chloride 0.9 % 100 mL IVPB (0 g Intravenous Stopped 01/08/19 0124)  azithromycin (ZITHROMAX) 500 mg in sodium chloride 0.9 % 250 mL IVPB (0 mg Intravenous Stopped 01/08/19 0315)    Mobility walks with device

## 2019-01-08 NOTE — ED Notes (Signed)
Water was given to pt. Nurse aware.

## 2019-01-08 NOTE — H&P (Addendum)
History and Physical    Manuel King [id number] DOB: 05/31/1929 DOA: 01/07/2019  PCP: Merrilee Seashore, MD  Patient coming from: Skilled nursing facility.  Chief Complaint: Fever and weakness.  HPI: Manuel King is a 85 y.o. male with history of dementia, CVA with right-sided weakness, hypertension, chronic kidney disease stage III with baseline creatinine around 1.4-1.9, history of prostate cancer was brought to the ER after patient was found to have a fever of 102 degrees at the living facility and also was found to be generally weak.  Patient was found to be more lethargic.    ED Course: In the ER patient was oriented to his name and place.  Follows commands.  Denies any chest pain shortness of breath.  Patient states he did notice some blood in the urine.  Patient was febrile with temperature around 102 F tachycardic with elevated procalcitonin levels.  UA is consistent with UTI also.  CT of the abdomen was done which shows features concerning for cystitis.  Patient was started on fluids for sepsis and empiric antibiotics.  Patient's creatinine has increased from 1.93 days ago to 2.7.  Patient has leukocytosis with normal lactate.  COVID-19 test was negative.  Review of Systems: As per HPI, rest all negative.   Past Medical History:  Diagnosis Date  . Arthritis   . Cellulitis currently   right foot  . CVA (cerebral vascular accident) ([place])   . Dementia ([place])   . Hypertension   . Prostate cancer [facility])     Past Surgical History:  Procedure Laterality Date  . ANTERIOR CERVICAL DECOMP/DISCECTOMY FUSION  05/29/1999   Anterior C3 and C4 diskectomy, decompression of the spinal cord,bone bank graft, Synthes plate and microscope/notes 09/17/2010  . CARPAL TUNNEL RELEASE Left 09/27/1999   Archie Endo 09/17/2010  . CARPAL TUNNEL RELEASE Right 08/12/2000   Archie Endo 09/17/2010  . FOOT SURGERY     Partial excision left medial cuneiform.; Partial excision left first metatarsal./notes  09/17/2010  . INSERTION PROSTATE RADIATION SEED  09/2005   Archie Endo 09/17/2010  . NECK SURGERY     "BOIL ON MY NECK"  . spider bite surgery     on left leg     reports that he has quit smoking. He quit after 15.00 years of use. He has never used smokeless tobacco. He reports that he does not drink alcohol or use drugs.  No Known Allergies  Family History  Problem Relation Age of Onset  . Hypotension Mother   . Diabetes type II Mother   . CAD Mother   . Hypotension Father   . CAD Father   . CAD Sister     Prior to Admission medications   Medication Sig Start Date End Date Taking? Authorizing Provider  acetaminophen (TYLENOL) 500 MG tablet Take 1,000 mg by mouth 2 (two) times daily.   Yes [provider]  allopurinol (ZYLOPRIM) 100 MG tablet Take 100 mg by mouth daily.   Yes [provider]  amLODipine (NORVASC) 5 MG tablet Take 5 mg by mouth daily.   Yes [provider]  divalproex (DEPAKOTE) 250 MG DR tablet Take 250-500 mg by mouth See admin instructions. Take 250 mg  tablet in the am and 500 mg  tablets in the pm   Yes [provider]  docusate sodium (COLACE) 100 MG capsule Take 100 mg by mouth daily.   Yes [provider]  donepezil (ARICEPT) 10 MG tablet Take 10 mg by mouth at bedtime.  Yes [provider]  ferrous sulfate 325 (65 FE) MG EC tablet Take 325 mg by mouth 2 (two) times daily.   Yes [provider]  hydrALAZINE (APRESOLINE) 25 MG tablet Take 1 tablet (25 mg total) by mouth every 8 (eight) hours. 12/25/14  Yes Theodis Blaze, MD  hydrochlorothiazide (HYDRODIURIL) 12.5 MG tablet Take 12.5 mg by mouth daily. 07/24/17  Yes [provider]  irbesartan (AVAPRO) 150 MG tablet Take 150 mg by mouth daily. 07/24/17  Yes [provider]  oxybutynin (DITROPAN) 5 MG tablet Take 5 mg by mouth 3 (three) times daily.   Yes [provider]  PARoxetine (PAXIL) 20 MG tablet Take 20 mg by mouth daily.    Yes [provider]  Polyethyl Glycol-Propyl Glycol (SYSTANE) 0.4-0.3 % SOLN Place 2 drops into both eyes 2 (two) times daily.    Yes [provider]  pravastatin (PRAVACHOL) 40 MG tablet Take 40 mg by mouth at bedtime.    Yes [provider]  traMADol (ULTRAM) 50 MG tablet Take 50 mg by mouth daily. 07/20/17  Yes [provider]  amoxicillin-clavulanate (AUGMENTIN) 875-125 MG tablet Take 1 tablet by mouth every 12 (twelve) hours. Patient not taking: Reported on 01/07/2019 08/24/17   Patrecia Pour, MD    Physical Exam: Constitutional: Moderately built and nourished. Vitals:   01/08/19 0030 01/08/19 0102 01/08/19 0130 01/08/19 0206  BP: (!) 129/46 (!) 144/62 (!) 120/91 135/60  Pulse: 86 99 83 76  Resp: 18 (!) 23 (!) 23 19  Temp:      TempSrc:      SpO2: 96% 98% 96% 95%  Weight:      Height:       Eyes: Anicteric no pallor. ENMT: No discharge from the ears eyes nose or mouth. Neck: No mass felt.  No neck rigidity. Respiratory: No rhonchi or crepitations. Cardiovascular: S1-S2 heard. Abdomen: Soft nontender bowel sounds present. Musculoskeletal: No edema. Skin: No rash. Neurologic: Awake oriented to name and place.  Moves all extremities. Psychiatric: Oriented to name and place.   Labs on Admission: I have personally reviewed following labs and imaging studies  CBC: Recent Labs  Lab 01/04/19 0125 01/07/19 2129  WBC 7.1 13.4*  NEUTROABS 3.8 10.5*  HGB 10.3* 9.4*  HCT 33.1* 29.1*  MCV 92.2 91.2  PLT 216 [id number]   Basic Metabolic Panel: Recent Labs  Lab 01/04/19 0125 01/07/19 2129  NA 139 139  K 4.6 4.5  CL 109 114*  CO2 19* 16*  GLUCOSE 101* 132*  BUN 49* 58*  CREATININE 1.99* 2.70*  CALCIUM 9.0 8.4*   GFR: Estimated Creatinine Clearance: 23.7 mL/min (A) (by C-G formula based on SCr of 2.7 mg/dL (H)). Liver Function Tests: Recent Labs  Lab 01/04/19 0125 01/07/19 2129  AST 12* 16  ALT 13 12  ALKPHOS 63 59  BILITOT 0.3 0.5   PROT 7.1 7.0  ALBUMIN 3.3* 3.0*   No results for input(s): LIPASE, AMYLASE in the last 168 hours. No results for input(s): AMMONIA in the last 168 hours. Coagulation Profile: Recent Labs  Lab 01/04/19 0125 01/07/19 2129  INR 1.2 1.3*   Cardiac Enzymes: No results for input(s): CKTOTAL, CKMB, CKMBINDEX, TROPONINI in the last 168 hours. BNP (last 3 results) No results for input(s): PROBNP in the last 8760 hours. HbA1C: No results for input(s): HGBA1C in the last 72 hours. CBG: No results for input(s): GLUCAP in the last 168 hours. Lipid Profile: Recent Labs  01/07/19 2207  TRIG 77   Thyroid Function Tests: No results for input(s): TSH, T4TOTAL, FREET4, T3FREE, THYROIDAB in the last 72 hours. Anemia Panel: Recent Labs    01/07/19 2207  FERRITIN 169   Urine analysis:    Component Value Date/Time   COLORURINE YELLOW 08/19/2017 0258   APPEARANCEUR CLEAR 08/19/2017 0258   LABSPEC 1.015 08/19/2017 0258   PHURINE 5.0 08/19/2017 0258   GLUCOSEU NEGATIVE 08/19/2017 0258   HGBUR NEGATIVE 08/19/2017 0258   BILIRUBINUR NEGATIVE 08/19/2017 0258   KETONESUR NEGATIVE 08/19/2017 0258   PROTEINUR 30 (A) 08/19/2017 0258   UROBILINOGEN 0.2 12/21/2014 1409   NITRITE NEGATIVE 08/19/2017 0258   LEUKOCYTESUR NEGATIVE 08/19/2017 0258   Sepsis Labs: @LABRCNTIP (procalcitonin:4,lacticidven:4) ) Recent Results (from the past 240 hour(s))  SARS Coronavirus 2 [facility] order, Performed in [facility] hospital lab) Nasopharyngeal Nasopharyngeal Swab     Status: None   Collection Time: 01/07/19  9:54 PM   Specimen: Nasopharyngeal Swab  Result Value Ref Range Status   SARS Coronavirus 2 NEGATIVE NEGATIVE Final    Comment: (NOTE) If result is NEGATIVE SARS-CoV-2 target nucleic acids are NOT DETECTED. The SARS-CoV-2 RNA is generally detectable in upper and lower  respiratory specimens during the acute phase of infection. The lowest  concentration of SARS-CoV-2 viral copies this assay  can detect is 250  copies / mL. A negative result does not preclude SARS-CoV-2 infection  and should not be used as the sole basis for treatment or other  patient management decisions.  A negative result may occur with  improper specimen collection / handling, submission of specimen other  than nasopharyngeal swab, presence of viral mutation(s) within the  areas targeted by this assay, and inadequate number of viral copies  (<250 copies / mL). A negative result must be combined with clinical  observations, patient history, and epidemiological information. If result is POSITIVE SARS-CoV-2 target nucleic acids are DETECTED. The SARS-CoV-2 RNA is generally detectable in upper and lower  respiratory specimens dur ing the acute phase of infection.  Positive  results are indicative of active infection with SARS-CoV-2.  Clinical  correlation with patient history and other diagnostic information is  necessary to determine patient infection status.  Positive results do  not rule out bacterial infection or co-infection with other viruses. If result is PRESUMPTIVE POSTIVE SARS-CoV-2 nucleic acids MAY BE PRESENT.   A presumptive positive result was obtained on the submitted specimen  and confirmed on repeat testing.  While 2019 novel coronavirus  (SARS-CoV-2) nucleic acids may be present in the submitted sample  additional confirmatory testing may be necessary for epidemiological  and / or clinical management purposes  to differentiate between  SARS-CoV-2 and other Sarbecovirus currently known to infect humans.  If clinically indicated additional testing with an alternate test  methodology [id number]) is advised. The SARS-CoV-2 RNA is generally  detectable in upper and lower respiratory sp ecimens during the acute  phase of infection. The expected result is Negative. Fact Sheet for Patients:  StrictlyIdeas.no Fact Sheet for Healthcare Providers:  BankingDealers.co.za This test is not yet approved or cleared by the [place] FDA and has been authorized for detection and/or diagnosis of SARS-CoV-2 by FDA under an Emergency Use Authorization (EUA).  This EUA will remain in effect (meaning this test can be used) for the duration of the COVID-19 declaration under Section 564(b)(1) of the Act, 21 U.S.C. section 360bbb-3(b)(1), unless the authorization is terminated or revoked sooner. Performed at [facility], [place]  [address] [place], [place] [address]      Radiological Exams on Admission: Dg Chest Port 1 View  Result Date: 01/07/2019 CLINICAL DATA:  Fever EXAM: PORTABLE CHEST 1 VIEW COMPARISON:  08/18/2017 FINDINGS: Low lung volumes. Patchy airspace opacity at the right infrahilar lung. Borderline cardiomegaly with aortic atherosclerosis. No pneumothorax. IMPRESSION: Low lung volumes. Patchy atelectasis or small infiltrate at the right infrahilar lung Electronically Signed   By: Donavan Foil M.D.   On: 01/07/2019 21:52    EKG: Independently reviewed.  Sinus tachycardia.  Assessment/Plan Principal Problem:   Sepsis ([place]) Active Problems:   Essential hypertension   History of CVA (cerebrovascular accident)   Vascular dementia ([place])   AKI (acute kidney injury) ([place])   Community acquired pneumonia   HLD (hyperlipidemia)    1. Sepsis likely from possible pneumonia and UTI -on empiric antibiotics follow cultures continue hydration. 2. Acute on chronic kidney disease stage III -we will hold ARB hydrochlorothiazide continue hydration.  Cause for worsening renal function not clear. 3. Hypertension on hydralazine and amlodipine.  Will hold ARB and hydrochlorothiazide due to renal failure.  On PRN IV hydralazine. 4. Dementia on Aricept. 5. Chronic anemia likely from renal disease appears to be at baseline.  Follow CBC since patient has complaints of hematuria.  On iron supplements. 6.  Hyperlipidemia on statins. 7. Depression on Depakote and Paxil. 8. History of gout on allopurinol.  Note that patient's creatinine is increased and if it further worsen may have to hold allopurinol.  Given the septic nature of patient's presentation with markedly elevated procalcitonin level with ongoing fever patient will need close monitoring for further deterioration and will be admitted as inpatient.   DVT prophylaxis: SCDs due to hematuria.  There is no further decline in hemoglobin may start Lovenox. Code Status: Full code.  Confirmed with patient's daughter. Family Communication: Discussed with patient's daughter Ms. Penix. Disposition Plan: Back to facility when stable. Consults called: None. Admission status: Inpatient.   Rise Patience MD [facility] Pager [phone].  If 7PM-7AM, please contact night-coverage [email or web address] Password [email or web address]  01/08/2019, 2:11 AM

## 2019-01-08 NOTE — ED Notes (Addendum)
Pt. Had peri area cleaned due to having a bowel movement. Pt. Bottom sheet were changed. Pt. Was assisted by Kemper Durie Ermalene Postin) and RN Ali Lowe). Nurse aware.

## 2019-01-09 DIAGNOSIS — E785 Hyperlipidemia, unspecified: Secondary | ICD-10-CM

## 2019-01-09 DIAGNOSIS — N390 Urinary tract infection, site not specified: Secondary | ICD-10-CM | POA: Diagnosis present

## 2019-01-09 DIAGNOSIS — R531 Weakness: Secondary | ICD-10-CM

## 2019-01-09 DIAGNOSIS — N183 Chronic kidney disease, stage 3 (moderate): Secondary | ICD-10-CM

## 2019-01-09 DIAGNOSIS — E538 Deficiency of other specified B group vitamins: Secondary | ICD-10-CM | POA: Diagnosis present

## 2019-01-09 DIAGNOSIS — D509 Iron deficiency anemia, unspecified: Secondary | ICD-10-CM

## 2019-01-09 LAB — BASIC METABOLIC PANEL
Anion gap: 9 (ref 5–15)
BUN: 45 mg/dL — ABNORMAL HIGH (ref 8–23)
CO2: 19 mmol/L — ABNORMAL LOW (ref 22–32)
Calcium: 7.7 mg/dL — ABNORMAL LOW (ref 8.9–10.3)
Chloride: 107 mmol/L (ref 98–111)
Creatinine, Ser: 2.08 mg/dL — ABNORMAL HIGH (ref 0.61–1.24)
GFR calc Af Amer: 32 mL/min — ABNORMAL LOW (ref >60)
GFR calc non Af Amer: 28 mL/min — ABNORMAL LOW (ref >60)
Glucose, Bld: 106 mg/dL — ABNORMAL HIGH (ref 70–99)
Potassium: 4.1 mmol/L (ref 3.5–5.1)
Sodium: 135 mmol/L (ref 135–145)

## 2019-01-09 LAB — IRON AND TIBC
Iron: <5 ug/dL — ABNORMAL LOW (ref 45–182)
Saturation Ratios: 1 % — ABNORMAL LOW (ref 17.9–39.5)
TIBC: 157 ug/dL — ABNORMAL LOW (ref 250–450)
UIBC: 156 ug/dL

## 2019-01-09 LAB — CBC WITH DIFFERENTIAL/PLATELET
Abs Immature Granulocytes: 0.21 10*3/uL — ABNORMAL HIGH (ref 0.00–0.07)
Basophils Absolute: 0 10*3/uL (ref 0.0–0.1)
Basophils Relative: 0 %
Eosinophils Absolute: 0.1 10*3/uL (ref 0.0–0.5)
Eosinophils Relative: 0 %
HCT: 27 % — ABNORMAL LOW (ref 39.0–52.0)
Hemoglobin: 8.4 g/dL — ABNORMAL LOW (ref 13.0–17.0)
Immature Granulocytes: 2 %
Lymphocytes Relative: 9 %
Lymphs Abs: 1 10*3/uL (ref 0.7–4.0)
MCH: 28.8 pg (ref 26.0–34.0)
MCHC: 31.1 g/dL (ref 30.0–36.0)
MCV: 92.5 fL (ref 80.0–100.0)
Monocytes Absolute: 1.4 10*3/uL — ABNORMAL HIGH (ref 0.1–1.0)
Monocytes Relative: 12 %
Neutro Abs: 9.1 10*3/uL — ABNORMAL HIGH (ref 1.7–7.7)
Neutrophils Relative %: 77 %
Platelets: 159 10*3/uL (ref 150–400)
RBC: 2.92 MIL/uL — ABNORMAL LOW (ref 4.22–5.81)
RDW: 16 % — ABNORMAL HIGH (ref 11.5–15.5)
WBC: 11.8 10*3/uL — ABNORMAL HIGH (ref 4.0–10.5)
nRBC: 0 % (ref 0.0–0.2)

## 2019-01-09 LAB — PROCALCITONIN: Procalcitonin: 6.5 ng/mL

## 2019-01-09 LAB — HIV ANTIBODY (ROUTINE TESTING W REFLEX): HIV Screen 4th Generation wRfx: NONREACTIVE

## 2019-01-09 LAB — FOLATE: Folate: 12.2 ng/mL (ref >5.9)

## 2019-01-09 LAB — VITAMIN B12: Vitamin B-12: 202 pg/mL (ref 180–914)

## 2019-01-09 LAB — FERRITIN: Ferritin: 299 ng/mL (ref 24–336)

## 2019-01-09 LAB — LACTIC ACID, PLASMA: Lactic Acid, Venous: 1.1 mmol/L (ref 0.5–1.9)

## 2019-01-09 MED ORDER — SODIUM CHLORIDE 0.9 % IV SOLN
510.0000 mg | Freq: Once | INTRAVENOUS | Status: AC
  Administered 2019-01-09: 510 mg via INTRAVENOUS
  Filled 2019-01-09: qty 17

## 2019-01-09 MED ORDER — BOOST / RESOURCE BREEZE PO LIQD CUSTOM
1.0000 | ORAL | Status: DC
  Administered 2019-01-09: 14:00:00 1 via ORAL

## 2019-01-09 MED ORDER — CYANOCOBALAMIN 1000 MCG/ML IJ SOLN
1000.0000 ug | Freq: Every day | INTRAMUSCULAR | Status: DC
  Administered 2019-01-09 – 2019-01-11 (×3): 1000 ug via INTRAMUSCULAR
  Filled 2019-01-09 (×3): qty 1

## 2019-01-09 MED ORDER — BISACODYL 10 MG RE SUPP
10.0000 mg | Freq: Every day | RECTAL | Status: DC
  Administered 2019-01-09: 10 mg via RECTAL
  Filled 2019-01-09: qty 1

## 2019-01-09 MED ORDER — ENSURE ENLIVE PO LIQD
237.0000 mL | Freq: Two times a day (BID) | ORAL | Status: DC
  Administered 2019-01-09 – 2019-01-10 (×2): 237 mL via ORAL

## 2019-01-09 NOTE — Progress Notes (Signed)
PROGRESS NOTE    Manuel King  [id number] DOB: [birth year] DOA: 01/07/2019 PCP: Merrilee Seashore, MD    Brief Narrative:  Patient is a 85 year old gentleman history of dementia, prior history of CVA with right-sided weakness, hypertension, chronic kidney disease stage III with baseline creatinine 1.4-1.9, history of prostate cancer presented to the ED found to have a fever of 102 at assisted living facility with generalized weakness and lethargy.  Patient seen in the ED patient noted to have a temperature of 102 with elevated procalcitonin levels.  Urinalysis consistent with UTI.  CT abdomen and pelvis which was done was concerning for cystitis and some perinephric stranding.  Patient placed on IV fluids for sepsis and empiric IV antibiotics.  Chest x-ray done with findings consistent with atelectasis versus early infiltrate.   Assessment & Plan:   Principal Problem:   Sepsis ([name]) Active Problems:   Essential hypertension   History of CVA (cerebrovascular accident)   Vascular dementia ([name])   Community acquired pneumonia   Generalized weakness   HLD (hyperlipidemia)   Iron deficiency anemia   Acute renal failure superimposed on stage 3 chronic kidney disease (HCC)   Low vitamin B12 level   Acute lower UTI  1 sepsis likely secondary to UTI and probable pneumonia Patient presented with fevers, leukocytosis, acute on chronic kidney disease stage III, elevated procalcitonin levels.  Urinalysis consistent with a UTI.  CT abdomen and pelvis which was done was concerning for cystitis and some perinephric stranding bilaterally.  Chest x-ray done concerning for atelectasis versus infiltrate.  Patient currently afebrile.  Procalcitonin slowly trending down.  Lactic acid level trending down.  Leukocytosis trending down.  Patient has been pancultured results pending.  Continue empiric IV Rocephin IV azithromycin.  Follow.  2.  Acute renal failure on chronic kidney disease stage III Likely  secondary to a prerenal azotemia in the setting of ARB and diuretic.  Renal function improving with hydration.  Continue to hold ARB and HCTZ.  Follow.  3.  Hypertension Continue to hold ARB and HCTZ secondary to acute on chronic kidney disease stage III.  Blood pressure stable.  Continue Norvasc and hydralazine.  Follow.  4.  Severe iron deficiency anemia/low vitamin B12 levels Patient with no overt bleeding.  Patient noted to have 11-20 RBCs.  Hemoglobin currently at 8.4 from 10.3 on 01/04/2019.  Anemia panel with iron level of less than 5, TIBC of 157, folate of 12.2, ferritin of 299, vitamin B12 of 202.  We will give a dose of IV Feraheme x1.  Placed on vitamin B12 1000 MCG's IM daily x7 days, and then weekly x1 month, and then monthly.  Follow H&H.  Transfusion threshold hemoglobin less than 7.  5.  Hyperlipidemia Continue statin.  6.  Vascular dementia Stable.  Continue Aricept.  7.  Depression Stable.  Continue home regimen of Depakote and Paxil.  9.  History of gout Renally dose allopurinol.  Follow for now.  10.  History of CVA Stable.  Patient with anemia.  Patient with no overt bleeding.  We will hold off on aspirin for now and defer to the outpatient setting.  Continue statin   DVT prophylaxis: SCDs Code Status: Full Family Communication: Updated patient.  No family at bedside. Disposition Plan: Likely back to skilled nursing facility when clinically improved.   Consultants:   None  Procedures:   CT renal stone protocol 01/08/2019  Chest x-ray 01/07/2019  Antimicrobials:   IV azithromycin 01/08/2019  IV Rocephin 01/08/2019  Subjective: Patient sleeping however easily arousable.  Denies any chest pain.  No shortness of breath.  No abdominal pain.  Per RN patient with some urinary retention.  Objective: Vitals:   01/08/19 1535 01/08/19 2035 01/09/19 0429 01/09/19 0525  BP: (!) 139/50 (!) 150/60 (!) 125/49 (!) 142/61  Pulse: 84 90 76 68  Resp: 18 18 18    Temp:  100.1 F (37.8 C) 99.1 F (37.3 C) 99.7 F (37.6 C)   TempSrc: Oral Oral Oral   SpO2: 98% 98% 99%   Weight:      Height:        Intake/Output Summary (Last 24 hours) at 01/09/2019 1105 Last data filed at 01/09/2019 0932 Gross per 24 hour  Intake 2731.34 ml  Output -  Net 2731.34 ml   Filed Weights   01/07/19 2217  Weight: 99.6 kg    Examination:  General exam: Appears calm and comfortable  Respiratory system: Clear to auscultation. Respiratory effort normal. Cardiovascular system: S1 & S2 heard, RRR. No JVD, murmurs, rubs, gallops or clicks. No pedal edema. Gastrointestinal system: Abdomen is nondistended, soft and nontender. No organomegaly or masses felt. Normal bowel sounds heard. Central nervous system: Alert and oriented. No focal neurological deficits. Extremities: Symmetric 5 x 5 power. Skin: No rashes, lesions or ulcers Psychiatry: Judgement and insight appear normal. Mood & affect appropriate.     Data Reviewed: I have personally reviewed following labs and imaging studies  CBC: Recent Labs  Lab 01/04/19 0125 01/07/19 2129 01/08/19 0246 01/09/19 0539  WBC 7.1 13.4* 13.1* 11.8*  NEUTROABS 3.8 10.5* 10.8* 9.1*  HGB 10.3* 9.4* 9.0* 8.4*  HCT 33.1* 29.1* 28.5* 27.0*  MCV 92.2 91.2 91.1 92.5  PLT 216 183 171 [id number]   Basic Metabolic Panel: Recent Labs  Lab 01/04/19 0125 01/07/19 2129 01/08/19 0246 01/09/19 0539  NA 139 139 139 135  K 4.6 4.5 4.3 4.1  CL 109 114* 110 107  CO2 19* 16* 17* 19*  GLUCOSE 101* 132* 113* 106*  BUN 49* 58* 51* 45*  CREATININE 1.99* 2.70* 2.30* 2.08*  CALCIUM 9.0 8.4* 8.3* 7.7*   GFR: Estimated Creatinine Clearance: 30.7 mL/min (A) (by C-G formula based on SCr of 2.08 mg/dL (H)). Liver Function Tests: Recent Labs  Lab 01/04/19 0125 01/07/19 2129 01/08/19 0246  AST 12* 16 16  ALT 13 12 12   ALKPHOS 63 59 56  BILITOT 0.3 0.5 0.6  PROT 7.1 7.0 6.4*  ALBUMIN 3.3* 3.0* 2.7*   No results for input(s): LIPASE, AMYLASE in  the last 168 hours. No results for input(s): AMMONIA in the last 168 hours. Coagulation Profile: Recent Labs  Lab 01/04/19 0125 01/07/19 2129  INR 1.2 1.3*   Cardiac Enzymes: No results for input(s): CKTOTAL, CKMB, CKMBINDEX, TROPONINI in the last 168 hours. BNP (last 3 results) No results for input(s): PROBNP in the last 8760 hours. HbA1C: No results for input(s): HGBA1C in the last 72 hours. CBG: No results for input(s): GLUCAP in the last 168 hours. Lipid Profile: Recent Labs    01/07/19 2207  TRIG 77   Thyroid Function Tests: No results for input(s): TSH, T4TOTAL, FREET4, T3FREE, THYROIDAB in the last 72 hours. Anemia Panel: Recent Labs    01/07/19 2207 01/09/19 0539  VITAMINB12  --  202  FOLATE  --  12.2  FERRITIN 169 299  TIBC  --  157*  IRON  --  <5*   Sepsis Labs: Recent Labs  Lab 01/07/19 2129 01/07/19 2207 01/07/19 2314  01/08/19 1803 01/09/19 0539  PROCALCITON  --  9.94  --  7.91 6.50  LATICACIDVEN 1.7  --  1.7  --  1.1    Recent Results (from the past 240 hour(s))  Culture, blood (Routine x 2)     Status: None (Preliminary result)   Collection Time: 01/07/19  9:30 PM   Specimen: BLOOD  Result Value Ref Range Status   Specimen Description   Final    BLOOD BLOOD LEFT HAND Performed at [facility] [address] [place], [place] [address]    Special Requests   Final    BOTTLES DRAWN AEROBIC AND ANAEROBIC Blood Culture adequate volume Performed at [facility] [address], [place], [place] [address]    Culture   Final    NO GROWTH 2 DAYS Performed at [facility] [address] [place], [place] [address]    Report Status PENDING  Incomplete  Culture, blood (Routine x 2)     Status: None (Preliminary result)   Collection Time: 01/07/19  9:30 PM   Specimen: BLOOD  Result Value Ref Range Status   Specimen Description   Final    BLOOD BLOOD RIGHT HAND Performed at [facility] [address] [place], [place] [address]    Special Requests   Final    BOTTLES DRAWN AEROBIC AND ANAEROBIC Blood Culture results may not be optimal due to an excessive volume of blood received in culture bottles Performed at [facility] [address] [place], [place] [address]    Culture   Final    NO GROWTH 2 DAYS Performed at [facility] [address] [place], [place] [address]    Report Status PENDING  Incomplete  SARS Coronavirus 2 [facility] order, Performed in [facility] hospital lab) Nasopharyngeal Nasopharyngeal Swab     Status: None   Collection Time: 01/07/19  9:54 PM   Specimen: Nasopharyngeal Swab  Result Value Ref Range Status   SARS Coronavirus 2 NEGATIVE NEGATIVE Final    Comment: (NOTE) If result is NEGATIVE SARS-CoV-2 target nucleic acids are NOT DETECTED. The SARS-CoV-2 RNA is generally detectable in upper and lower  respiratory specimens during the acute phase of infection. The lowest  concentration of SARS-CoV-2 viral copies this assay can detect is 250  copies / mL. A negative result does not preclude SARS-CoV-2 infection  and should not be used as the sole basis for treatment or other  patient management decisions.  A negative result may occur with  improper specimen collection / handling, submission of specimen other  than nasopharyngeal swab, presence of viral mutation(s) within the  areas targeted by this assay, and inadequate number of viral copies  (<250 copies / mL). A negative result must be combined with clinical  observations, patient history, and epidemiological information. If result is POSITIVE SARS-CoV-2 target nucleic acids are DETECTED. The SARS-CoV-2 RNA is generally detectable in upper and lower  respiratory specimens dur ing the acute phase of infection.  Positive  results are indicative of active infection with SARS-CoV-2.  Clinical  correlation with patient history and other diagnostic  information is  necessary to determine patient infection status.  Positive results do  not rule out bacterial infection or co-infection with other viruses. If result is PRESUMPTIVE POSTIVE SARS-CoV-2 nucleic acids MAY BE PRESENT.   A presumptive positive result was obtained on the submitted specimen  and confirmed on repeat testing.  While 2019 novel  coronavirus  (SARS-CoV-2) nucleic acids may be present in the submitted sample  additional confirmatory testing may be necessary for epidemiological  and / or clinical management purposes  to differentiate between  SARS-CoV-2 and other Sarbecovirus currently known to infect humans.  If clinically indicated additional testing with an alternate test  methodology [phone]) is advised. The SARS-CoV-2 RNA is generally  detectable in upper and lower respiratory sp ecimens during the acute  phase of infection. The expected result is Negative. Fact Sheet for Patients:  StrictlyIdeas.no Fact Sheet for Healthcare Providers: BankingDealers.co.za This test is not yet approved or cleared by the [place] FDA and has been authorized for detection and/or diagnosis of SARS-CoV-2 by FDA under an Emergency Use Authorization (EUA).  This EUA will remain in effect (meaning this test can be used) for the duration of the COVID-19 declaration under Section 564(b)(1) of the Act, 21 U.S.C. section 360bbb-3(b)(1), unless the authorization is terminated or revoked sooner. Performed at [facility], [place] [address], [place], [place] [address]   Urine culture     Status: Abnormal (Preliminary result)   Collection Time: 01/08/19  3:17 AM   Specimen: Urine, Clean Catch  Result Value Ref Range Status   Specimen Description   Final    URINE, CLEAN CATCH Performed at [facility], [place] [address], [place], [place] [address]    Special Requests   Final    NONE Performed at [facility], [place] [address] [place], [place] [address]    Culture (A)  Final    >=100,000 COLONIES/mL GRAM NEGATIVE RODS CULTURE REINCUBATED FOR BETTER GROWTH Performed at [facility], [place] [address], [place], [place] [address]    Report Status PENDING  Incomplete  MRSA PCR Screening     Status: Abnormal   Collection Time: 01/08/19  4:01 PM   Specimen: Nasal Mucosa; Nasopharyngeal  Result Value Ref Range Status   MRSA by PCR (A) NEGATIVE Final    INVALID, UNABLE TO DETERMINE THE PRESENCE OF TARGET DUE TO SPECIMEN INTEGRITY. RECOLLECTION REQUESTED.    Comment: CRITICAL RESULT CALLED TO, READ BACK BY AND VERIFIED WITH: Roxan Diesel [id number] @ [place] Performed at [facility], [place] [address], [place], [place] [address]   MRSA PCR Screening     Status: None   Collection Time: 01/08/19  8:13 PM   Specimen: Nasal Mucosa; Nasopharyngeal  Result Value Ref Range Status   MRSA by PCR NEGATIVE NEGATIVE Final    Comment:        The GeneXpert MRSA Assay (FDA approved for NASAL specimens only), is one component of a comprehensive MRSA colonization surveillance program. It is not intended to diagnose MRSA infection nor to guide or monitor treatment for MRSA infections. Performed at [facility], [place] [address], [place], [place] [address]          Radiology Studies: Dg Chest Port 1 View  Result Date: 01/07/2019 CLINICAL DATA:  Fever EXAM: PORTABLE CHEST 1 VIEW COMPARISON:  08/18/2017 FINDINGS: Low lung volumes. Patchy airspace opacity at the right infrahilar lung. Borderline cardiomegaly with aortic atherosclerosis. No pneumothorax. IMPRESSION: Low lung volumes. Patchy atelectasis or small infiltrate at the right infrahilar lung Electronically Signed   By: Donavan Foil M.D.   On: 01/07/2019 21:52   Ct Renal Stone Study  Result Date: 01/08/2019 CLINICAL DATA:  Hematuria EXAM: CT ABDOMEN AND PELVIS WITHOUT CONTRAST  TECHNIQUE: Multidetector CT imaging of the abdomen  and pelvis was performed following the standard protocol without IV contrast. COMPARISON:  None. FINDINGS: Lower chest: There is mild cardiomegaly. No hiatal hernia. The visualized portions of the lungs are clear. Hepatobiliary: There is a 2.3 cm low-density lesion seen within the right liver lobe. No evidence of calcified gallstones or biliary ductal dilatation. Pancreas:  Unremarkable.  No surrounding inflammatory changes. Spleen: Normal in size. Although limited due to the lack of intravenous contrast, normal in appearance. Adrenals/Urinary Tract: Both adrenal glands appear normal. There is atrophy with bilateral perinephric stranding. No renal calculi are seen. No hydronephrosis. There appears to be mild bladder wall thickening with surrounding mild fat stranding changes. Stomach/Bowel: The stomach, small bowel, and colon are normal in appearance. No inflammatory changes or obstructive findings. There is a moderate amount of colonic stool. The appendix is not seen. Vascular/Lymphatic: There are no enlarged abdominal or pelvic lymph nodes. Scattered aortic atherosclerotic calcifications are seen without aneurysmal dilatation. Reproductive: Radiation seeds are seen within the prostate. Other: Small bilateral fat containing inguinal hernias are seen. Musculoskeletal: No acute or significant osseous findings. Degenerative changes are seen in the lower lumbar spine is IMPRESSION: Mild diffuse bladder wall thickening with adjacent inflammatory changes. This could be due to cystitis. Please correlate with the patient's clinical laboratory exam. Bilateral renal cortical thinning with perinephric stranding. No hydronephrosis. Electronically Signed   By: Prudencio Pair M.D.   On: 01/08/2019 03:25        Scheduled Meds: . allopurinol  100 mg Oral Daily  . amLODipine  5 mg Oral Daily  . bisacodyl  10 mg Rectal Daily  . cyanocobalamin  1,000 mcg Intramuscular Daily   . divalproex  250 mg Oral Daily  . divalproex  500 mg Oral QHS  . docusate sodium  100 mg Oral Daily  . donepezil  10 mg Oral QHS  . ferrous sulfate  325 mg Oral BID  . hydrALAZINE  25 mg Oral Q8H  . oxybutynin  5 mg Oral TID  . PARoxetine  20 mg Oral Daily  . polyvinyl alcohol  2 drop Both Eyes BID  . pravastatin  40 mg Oral QHS  . traMADol  50 mg Oral Daily   Continuous Infusions: . sodium chloride 100 mL/hr at 01/09/19 0600  . azithromycin Stopped (01/08/19 1932)  . cefTRIAXone (ROCEPHIN)  IV 2 g (01/08/19 1703)  . lactated ringers Stopped (01/08/19 1833)     LOS: 1 day    Time spent: 40 minutes    Irine Seal, MD [facility]  If 7PM-7AM, please contact night-coverage [email or web address] 01/09/2019, 11:05 AM

## 2019-01-09 NOTE — Progress Notes (Signed)
Post void residual bladder scan performed on pt at 1208 this shift yielding 387 mL. MD notified. Will continue to monitor.

## 2019-01-09 NOTE — Progress Notes (Signed)
Initial Nutrition Assessment  RD working remotely.   DOCUMENTATION CODES:   Not applicable  INTERVENTION:  - will order Ensure Enlive BID, each supplement provides 350 kcal and 20 grams of protein. - will order Boost Breeze once/day, each supplement provides 250 kcal and 9 grams of protein. - weigh patient today. - continue to encourage PO intakes.    NUTRITION DIAGNOSIS:   Increased nutrient needs related to acute illness as evidenced by estimated needs.  GOAL:   Patient will meet greater than or equal to 90% of their needs  MONITOR:   PO intake, Supplement acceptance, Labs, Weight trends  REASON FOR ASSESSMENT:   Malnutrition Screening Tool  ASSESSMENT:   85 y.o. male with history of dementia, CVA with R-sided weakness, HTN, CKD stage 3, and history of prostate cancer. Patient presented to the ED from a facility d/t fever of 102 degrees, generalized weakness, and lethargy. UA in the ED was consistent with UTI. CT abdomen was concerning for cystitis.  Per flow sheet, patient is a/o to self only and patient consumed 100% of breakfast this AM. Per chart review, current weight recorded as 99.6 kg/220 lb which is identical to recording on 08/24/17. No other recent weight hx available.  Per notes: - sepsis--likely 2/2 PNA and UTI - acute on CKD stage 3 - dementia - chronic anemia--thought to be d/t renal disease   Labs reviewed; BUN: 45 mg/dl, creatinine: 2.08 mg/dl, Ca: 7.7 mg/dl, GFR: 28 ml/min. Medications reviewed; 10 mg rectal dulcolax/day, 1000 mcg IM vitamin B12/day, 100 mg colace/day, 325 mg ferrous sulfate BID, 510 mg feraheme x1 dose 9/6. IVF; NS @ 100 ml/hr.    NUTRITION - FOCUSED PHYSICAL EXAM:  unable to complete at this time.   Diet Order:   Diet Order            Diet Heart Room service appropriate? Yes; Fluid consistency: Thin  Diet effective now              EDUCATION NEEDS:   No education needs have been identified at this time  Skin:   Skin Assessment: Reviewed RN Assessment  Last BM:  9/6  Height:   Ht Readings from Last 1 Encounters:  01/07/19 6\' 4"  (1.93 m)    Weight:   Wt Readings from Last 1 Encounters:  01/07/19 99.6 kg    Ideal Body Weight:  91.8 kg  BMI:  Body mass index is 26.73 kg/m.  Estimated Nutritional Needs:   Kcal:  1800-2000 kcal  Protein:  100-110 grams  Fluid:  >/= 2 L/day     Jarome Matin, MS, RD, LDN, [facility] Inpatient Clinical Dietitian Pager # [phone] After hours/weekend pager # [phone]

## 2019-01-10 DIAGNOSIS — G894 Chronic pain syndrome: Secondary | ICD-10-CM | POA: Diagnosis not present

## 2019-01-10 DIAGNOSIS — R338 Other retention of urine: Secondary | ICD-10-CM

## 2019-01-10 LAB — CBC WITH DIFFERENTIAL/PLATELET
Abs Immature Granulocytes: 0.13 10*3/uL — ABNORMAL HIGH (ref 0.00–0.07)
Basophils Absolute: 0 10*3/uL (ref 0.0–0.1)
Basophils Relative: 0 %
Eosinophils Absolute: 0.1 10*3/uL (ref 0.0–0.5)
Eosinophils Relative: 1 %
HCT: 27.1 % — ABNORMAL LOW (ref 39.0–52.0)
Hemoglobin: 8.5 g/dL — ABNORMAL LOW (ref 13.0–17.0)
Immature Granulocytes: 1 %
Lymphocytes Relative: 13 %
Lymphs Abs: 1.3 10*3/uL (ref 0.7–4.0)
MCH: 29 pg (ref 26.0–34.0)
MCHC: 31.4 g/dL (ref 30.0–36.0)
MCV: 92.5 fL (ref 80.0–100.0)
Monocytes Absolute: 1.2 10*3/uL — ABNORMAL HIGH (ref 0.1–1.0)
Monocytes Relative: 11 %
Neutro Abs: 7.5 10*3/uL (ref 1.7–7.7)
Neutrophils Relative %: 74 %
Platelets: 159 10*3/uL (ref 150–400)
RBC: 2.93 MIL/uL — ABNORMAL LOW (ref 4.22–5.81)
RDW: 16.1 % — ABNORMAL HIGH (ref 11.5–15.5)
WBC: 10.2 10*3/uL (ref 4.0–10.5)
nRBC: 0 % (ref 0.0–0.2)

## 2019-01-10 LAB — URINE CULTURE: Culture: >=100000 — AB

## 2019-01-10 LAB — BASIC METABOLIC PANEL
Anion gap: 7 (ref 5–15)
BUN: 37 mg/dL — ABNORMAL HIGH (ref 8–23)
CO2: 20 mmol/L — ABNORMAL LOW (ref 22–32)
Calcium: 8.3 mg/dL — ABNORMAL LOW (ref 8.9–10.3)
Chloride: 112 mmol/L — ABNORMAL HIGH (ref 98–111)
Creatinine, Ser: 1.69 mg/dL — ABNORMAL HIGH (ref 0.61–1.24)
GFR calc Af Amer: 41 mL/min — ABNORMAL LOW (ref >60)
GFR calc non Af Amer: 36 mL/min — ABNORMAL LOW (ref >60)
Glucose, Bld: 105 mg/dL — ABNORMAL HIGH (ref 70–99)
Potassium: 4.3 mmol/L (ref 3.5–5.1)
Sodium: 139 mmol/L (ref 135–145)

## 2019-01-10 LAB — MAGNESIUM: Magnesium: 1.6 mg/dL — ABNORMAL LOW (ref 1.7–2.4)

## 2019-01-10 LAB — LEGIONELLA PNEUMOPHILA SEROGP 1 UR AG: L. pneumophila Serogp 1 Ur Ag: NEGATIVE

## 2019-01-10 LAB — PROCALCITONIN: Procalcitonin: 4.12 ng/mL

## 2019-01-10 MED ORDER — ASPIRIN EC 81 MG PO TBEC
81.0000 mg | DELAYED_RELEASE_TABLET | Freq: Every day | ORAL | Status: DC
  Administered 2019-01-10 – 2019-01-11 (×2): 81 mg via ORAL
  Filled 2019-01-10 (×2): qty 1

## 2019-01-10 MED ORDER — TAMSULOSIN HCL 0.4 MG PO CAPS
0.4000 mg | ORAL_CAPSULE | Freq: Every day | ORAL | Status: DC
  Administered 2019-01-10 – 2019-01-11 (×2): 0.4 mg via ORAL
  Filled 2019-01-10 (×2): qty 1

## 2019-01-10 MED ORDER — PHENOL 1.4 % MT LIQD
1.0000 | OROMUCOSAL | Status: DC | PRN
  Administered 2019-01-10: 1 via OROMUCOSAL
  Filled 2019-01-10: qty 177

## 2019-01-10 MED ORDER — MAGNESIUM SULFATE 4 GM/100ML IV SOLN
4.0000 g | Freq: Once | INTRAVENOUS | Status: AC
  Administered 2019-01-10: 4 g via INTRAVENOUS
  Filled 2019-01-10: qty 100

## 2019-01-10 MED ORDER — BISACODYL 10 MG RE SUPP: 10.0000 mg | Freq: Every day | RECTAL | Status: DC | PRN

## 2019-01-10 NOTE — Progress Notes (Signed)
PT Cancellation Note  Patient Details Name: STEVEN SLEPPY MRN: [id number] DOB: 12-Jul-1929   Cancelled Treatment:    Reason Eval/Treat Not Completed: Patient at procedure or test/unavailable patient currently receiving in and out cath, will attempt to return if time/schedule allow.     Deniece Ree PT, DPT, CBIS  Supplemental Physical Therapist [facility]    Pager [phone] Acute Rehab Office [phone]

## 2019-01-10 NOTE — Progress Notes (Signed)
Pt was incontinent  Around 0930. Bladder scanned pt and found 511 ml. Md notified. Eulas Post, RN

## 2019-01-10 NOTE — Progress Notes (Signed)
Patient inontinent again since bladder scan of 511 cc. I/O cath done with 750 cc clear yellow urine returned.Eulas Post, RN

## 2019-01-10 NOTE — Progress Notes (Signed)
PROGRESS NOTE    Manuel King  [id number] DOB: 04-Nov-1929 DOA: 01/07/2019 PCP: Merrilee Seashore, MD    Brief Narrative:  Patient is a 85 year old gentleman history of dementia, prior history of CVA with right-sided weakness, hypertension, chronic kidney disease stage III with baseline creatinine 1.4-1.9, history of prostate cancer presented to the ED found to have a fever of 102 at assisted living facility with generalized weakness and lethargy.  Patient seen in the ED patient noted to have a temperature of 102 with elevated procalcitonin levels.  Urinalysis consistent with UTI.  CT abdomen and pelvis which was done was concerning for cystitis and some perinephric stranding.  Patient placed on IV fluids for sepsis and empiric IV antibiotics.  Chest x-ray done with findings consistent with atelectasis versus early infiltrate.   Assessment & Plan:   Principal Problem:   Sepsis ([place]) Active Problems:   Acute lower UTI   Essential hypertension   History of CVA (cerebrovascular accident)   Vascular dementia ([place])   Community acquired pneumonia   Generalized weakness   HLD (hyperlipidemia)   Iron deficiency anemia   Acute renal failure superimposed on stage 3 chronic kidney disease (HCC)   Low vitamin B12 level  1 sepsis likely secondary to UTI and probable pneumonia Patient presented with fevers, leukocytosis, acute on chronic kidney disease stage III, elevated procalcitonin levels.  Urinalysis consistent with a UTI.  CT abdomen and pelvis which was done was concerning for cystitis and some perinephric stranding bilaterally.  Chest x-ray done concerning for atelectasis versus infiltrate.  Patient currently afebrile.  Procalcitonin slowly trending down.  Lactic acid level trending down.  Leukocytosis trending down.  Patient has been pancultured results pending.  Urine cultures with thousand colonies of Klebsiella pneumonia and Aerococcus viridans.  Sensitivities for Aerococcus viridans  pending.  Klebsiella pneumonia sensitive to the cephalosporins.  Continue empiric IV Rocephin IV azithromycin.  Follow.  2.  Acute renal failure on chronic kidney disease stage III Likely secondary to a prerenal azotemia in the setting of ARB and diuretic.  Renal function improving with hydration.  Continue to hold ARB and HCTZ.  Monitor closely for volume overload.  Follow.  3.  Hypertension Continue to hold ARB and HCTZ secondary to acute on chronic kidney disease stage III.  Blood pressure stable.  Continue Norvasc and hydralazine.  Follow.  4.  Severe iron deficiency anemia/low vitamin B12 levels Patient with no overt bleeding.  Patient noted to have 11-20 RBCs.  Hemoglobin currently at 8.5 from 8.4 from 10.3 on 01/04/2019.  Anemia panel with iron level of less than 5, TIBC of 157, folate of 12.2, ferritin of 299, vitamin B12 of 202.  Status post IV Feraheme x1.  Continue vitamin B12 1000 MCG's IM daily x6 days, then weekly x1 month, then monthly.  Follow H&H. Transfusion threshold hemoglobin less than 7.  5.  Hyperlipidemia Continue statin.  6.  Vascular dementia Stable.  Continue Aricept.  7.  Depression Continue home regimen Depakote and Paxil.    9.  History of gout Renally dose allopurinol.  Follow for now.  10.  History of probable nonhemorrhagic CVA Stable.  Patient with anemia.  Patient with no overt bleeding.  Will place patient on aspirin 81 mg daily for secondary stroke prophylaxis.  Continue statin.  Follow.  11.  Urinary retention Postvoid residual every 4 hours was ordered yesterday however only done once.  Check postvoid residual.  Place on Flomax.     DVT prophylaxis: SCDs Code  Status: Full Family Communication: Updated patient.  No family at bedside. Disposition Plan: Likely back to skilled nursing facility when clinically improved hopefully the next 24 to 48 hours.   Consultants:   None  Procedures:   CT renal stone protocol 01/08/2019  Chest x-ray  01/07/2019  Antimicrobials:   IV azithromycin 01/08/2019  IV Rocephin 01/08/2019   Subjective: Patient sitting up in bed eating his breakfast.  Denies any chest pain or shortness of breath.  No abdominal pain.  Patient states he is feeling better.  Per RN patient with urinary incontinence.  Postvoid residual was ordered last night however was only done once.    Objective: Vitals:   01/09/19 1343 01/09/19 1429 01/09/19 2039 01/10/19 0617  BP: (!) 133/54  (!) 140/55 139/67  Pulse: 65  73 75  Resp: 18  18 18   Temp: 98.5 F (36.9 C)  100 F (37.8 C) 98.3 F (36.8 C)  TempSrc: Oral  Oral Oral  SpO2: 98%  100% 96%  Weight:  94.9 kg    Height:        Intake/Output Summary (Last 24 hours) at 01/10/2019 0958 Last data filed at 01/10/2019 0600 Gross per 24 hour  Intake 3206.35 ml  Output 987 ml  Net 2219.35 ml   Filed Weights   01/07/19 2217 01/09/19 1429  Weight: 99.6 kg 94.9 kg    Examination:  General exam: NAD Respiratory system: CTAB.  No wheezes, no crackles, no rhonchi.  Speaking in full sentences.  Normal respiratory effort.   Cardiovascular system: Regular rate rhythm no murmurs rubs or gallops.  No JVD.  No lower extremity edema. Gastrointestinal system: Abdomen is soft, nontender, nondistended, positive bowel sounds.  No rebound.  No guarding.  Central nervous system: Alert and oriented. No focal neurological deficits. Extremities: Symmetric 5 x 5 power. Skin: No rashes, lesions or ulcers Psychiatry: Judgement and insight appear normal. Mood & affect appropriate.     Data Reviewed: I have personally reviewed following labs and imaging studies  CBC: Recent Labs  Lab 01/04/19 0125 01/07/19 2129 01/08/19 0246 01/09/19 0539 01/10/19 0504  WBC 7.1 13.4* 13.1* 11.8* 10.2  NEUTROABS 3.8 10.5* 10.8* 9.1* 7.5  HGB 10.3* 9.4* 9.0* 8.4* 8.5*  HCT 33.1* 29.1* 28.5* 27.0* 27.1*  MCV 92.2 91.2 91.1 92.5 92.5  PLT 216 183 171 159 [id number]   Basic Metabolic Panel: Recent Labs   Lab 01/04/19 0125 01/07/19 2129 01/08/19 0246 01/09/19 0539 01/10/19 0504  NA 139 139 139 135 139  K 4.6 4.5 4.3 4.1 4.3  CL 109 114* 110 107 112*  CO2 19* 16* 17* 19* 20*  GLUCOSE 101* 132* 113* 106* 105*  BUN 49* 58* 51* 45* 37*  CREATININE 1.99* 2.70* 2.30* 2.08* 1.69*  CALCIUM 9.0 8.4* 8.3* 7.7* 8.3*  MG  --   --   --   --  1.6*   GFR: Estimated Creatinine Clearance: 37.8 mL/min (A) (by C-G formula based on SCr of 1.69 mg/dL (H)). Liver Function Tests: Recent Labs  Lab 01/04/19 0125 01/07/19 2129 01/08/19 0246  AST 12* 16 16  ALT 13 12 12   ALKPHOS 63 59 56  BILITOT 0.3 0.5 0.6  PROT 7.1 7.0 6.4*  ALBUMIN 3.3* 3.0* 2.7*   No results for input(s): LIPASE, AMYLASE in the last 168 hours. No results for input(s): AMMONIA in the last 168 hours. Coagulation Profile: Recent Labs  Lab 01/04/19 0125 01/07/19 2129  INR 1.2 1.3*   Cardiac Enzymes: No results for  input(s): CKTOTAL, CKMB, CKMBINDEX, TROPONINI in the last 168 hours. BNP (last 3 results) No results for input(s): PROBNP in the last 8760 hours. HbA1C: No results for input(s): HGBA1C in the last 72 hours. CBG: No results for input(s): GLUCAP in the last 168 hours. Lipid Profile: Recent Labs    01/07/19 2207  TRIG 77   Thyroid Function Tests: No results for input(s): TSH, T4TOTAL, FREET4, T3FREE, THYROIDAB in the last 72 hours. Anemia Panel: Recent Labs    01/07/19 2207 01/09/19 0539  VITAMINB12  --  202  FOLATE  --  12.2  FERRITIN 169 299  TIBC  --  157*  IRON  --  <5*   Sepsis Labs: Recent Labs  Lab 01/07/19 2129 01/07/19 2207 01/07/19 2314 01/08/19 1803 01/09/19 0539 01/10/19 0504  PROCALCITON  --  9.94  --  7.91 6.50 4.12  LATICACIDVEN 1.7  --  1.7  --  1.1  --     Recent Results (from the past 240 hour(s))  Culture, blood (Routine x 2)     Status: None (Preliminary result)   Collection Time: 01/07/19  9:30 PM   Specimen: BLOOD  Result Value Ref Range Status   Specimen  Description   Final    BLOOD BLOOD LEFT HAND Performed at [facility], [place] [address], [place], [place] [address]    Special Requests   Final    BOTTLES DRAWN AEROBIC AND ANAEROBIC Blood Culture adequate volume Performed at [place] [address], [place], [place] [address]    Culture   Final    NO GROWTH 3 DAYS Performed at [facility], [place] [address] [place], [place] [address]    Report Status PENDING  Incomplete  Culture, blood (Routine x 2)     Status: None (Preliminary result)   Collection Time: 01/07/19  9:30 PM   Specimen: BLOOD RIGHT HAND  Result Value Ref Range Status   Specimen Description   Final    BLOOD RIGHT HAND Performed at [facility], [place] [address] [place], [place] [address]    Special Requests   Final    BOTTLES DRAWN AEROBIC AND ANAEROBIC Blood Culture results may not be optimal due to an excessive volume of blood received in culture bottles Performed at [place] [address], [place], [place] [address]    Culture   Final    NO GROWTH 3 DAYS Performed at [facility], [place] [address], [place], [place] [address]    Report Status PENDING  Incomplete  SARS Coronavirus 2 [facility] order, Performed in [facility] hospital lab) Nasopharyngeal Nasopharyngeal Swab     Status: None   Collection Time: 01/07/19  9:54 PM   Specimen: Nasopharyngeal Swab  Result Value Ref Range Status   SARS Coronavirus 2 NEGATIVE NEGATIVE Final    Comment: (NOTE) If result is NEGATIVE SARS-CoV-2 target nucleic acids are NOT DETECTED. The SARS-CoV-2 RNA is generally detectable in upper and lower  respiratory specimens during the acute phase of infection. The lowest  concentration of SARS-CoV-2 viral copies this assay can detect is 250  copies / mL. A negative result does not preclude SARS-CoV-2 infection  and should not be used as the sole basis for treatment or other  patient management  decisions.  A negative result may occur with  improper specimen collection / handling, submission of specimen other  than nasopharyngeal swab, presence of viral mutation(s) within the  areas targeted  by this assay, and inadequate number of viral copies  (<250 copies / mL). A negative result must be combined with clinical  observations, patient history, and epidemiological information. If result is POSITIVE SARS-CoV-2 target nucleic acids are DETECTED. The SARS-CoV-2 RNA is generally detectable in upper and lower  respiratory specimens dur ing the acute phase of infection.  Positive  results are indicative of active infection with SARS-CoV-2.  Clinical  correlation with patient history and other diagnostic information is  necessary to determine patient infection status.  Positive results do  not rule out bacterial infection or co-infection with other viruses. If result is PRESUMPTIVE POSTIVE SARS-CoV-2 nucleic acids MAY BE PRESENT.   A presumptive positive result was obtained on the submitted specimen  and confirmed on repeat testing.  While 2019 novel coronavirus  (SARS-CoV-2) nucleic acids may be present in the submitted sample  additional confirmatory testing may be necessary for epidemiological  and / or clinical management purposes  to differentiate between  SARS-CoV-2 and other Sarbecovirus currently known to infect humans.  If clinically indicated additional testing with an alternate test  methodology [phone]) is advised. The SARS-CoV-2 RNA is generally  detectable in upper and lower respiratory sp ecimens during the acute  phase of infection. The expected result is Negative. Fact Sheet for Patients:  StrictlyIdeas.no Fact Sheet for Healthcare Providers: BankingDealers.co.za This test is not yet approved or cleared by the [place] FDA and has been authorized for detection and/or diagnosis of SARS-CoV-2 by FDA under an  Emergency Use Authorization (EUA).  This EUA will remain in effect (meaning this test can be used) for the duration of the COVID-19 declaration under Section 564(b)(1) of the Act, 21 U.S.C. section 360bbb-3(b)(1), unless the authorization is terminated or revoked sooner. Performed at [facility], [place] [address] [place], [place] [address]   Urine culture     Status: Abnormal   Collection Time: 01/08/19  3:17 AM   Specimen: Urine, Clean Catch  Result Value Ref Range Status   Specimen Description   Final    URINE, CLEAN CATCH Performed at [facility], [place] [address] [place], [place] [address]    Special Requests   Final    NONE Performed at [facility], [place] [address], [place], [place] [address]    Culture (A)  Final    >=100,000 COLONIES/mL KLEBSIELLA PNEUMONIAE >=100,000 COLONIES/mL AEROCOCCUS VIRIDANS    Report Status 01/10/2019 FINAL  Final   Organism ID, Bacteria KLEBSIELLA PNEUMONIAE (A)  Final      Susceptibility   Klebsiella pneumoniae - MIC*    AMPICILLIN RESISTANT Resistant     CEFAZOLIN <=4 SENSITIVE Sensitive     CEFTRIAXONE <=1 SENSITIVE Sensitive     CIPROFLOXACIN <=0.25 SENSITIVE Sensitive     GENTAMICIN <=1 SENSITIVE Sensitive     IMIPENEM <=0.25 SENSITIVE Sensitive     NITROFURANTOIN <=16 SENSITIVE Sensitive     TRIMETH/SULFA <=20 SENSITIVE Sensitive     AMPICILLIN/SULBACTAM 4 SENSITIVE Sensitive     PIP/TAZO <=4 SENSITIVE Sensitive     Extended ESBL NEGATIVE Sensitive     * >=100,000 COLONIES/mL KLEBSIELLA PNEUMONIAE  MRSA PCR Screening     Status: Abnormal   Collection Time: 01/08/19  4:01 PM   Specimen: Nasal Mucosa; Nasopharyngeal  Result Value Ref Range Status   MRSA by PCR (A) NEGATIVE Final    INVALID, UNABLE TO DETERMINE THE PRESENCE OF TARGET DUE TO SPECIMEN INTEGRITY. RECOLLECTION REQUESTED.    Comment: CRITICAL  RESULT CALLED TO, READ BACK BY AND VERIFIED WITHRoxan Diesel [id number] @  [place] Performed at [facility], [place] [address], [place], [place] [address]   MRSA PCR Screening     Status: None   Collection Time: 01/08/19  8:13 PM   Specimen: Nasal Mucosa; Nasopharyngeal  Result Value Ref Range Status   MRSA by PCR NEGATIVE NEGATIVE Final    Comment:        The GeneXpert MRSA Assay (FDA approved for NASAL specimens only), is one component of a comprehensive MRSA colonization surveillance program. It is not intended to diagnose MRSA infection nor to guide or monitor treatment for MRSA infections. Performed at [facility] [place] [address] [place], [place] [address]          Radiology Studies: No results found.      Scheduled Meds:  allopurinol  100 mg Oral Daily   amLODipine  5 mg Oral Daily   cyanocobalamin  1,000 mcg Intramuscular Daily   divalproex  250 mg Oral Daily   divalproex  500 mg Oral QHS   docusate sodium  100 mg Oral Daily   donepezil  10 mg Oral QHS   feeding supplement  1 Container Oral Q24H   feeding supplement (ENSURE ENLIVE)  237 mL Oral BID BM   ferrous sulfate  325 mg Oral BID   hydrALAZINE  25 mg Oral Q8H   oxybutynin  5 mg Oral TID   PARoxetine  20 mg Oral Daily   polyvinyl alcohol  2 drop Both Eyes BID   pravastatin  40 mg Oral QHS   traMADol  50 mg Oral Daily   Continuous Infusions:  sodium chloride 100 mL/hr at 01/10/19 0444   azithromycin 500 mg (01/09/19 1828)   cefTRIAXone (ROCEPHIN)  IV 2 g (01/09/19 1740)   magnesium sulfate bolus IVPB       LOS: 2 days    Time spent: 40 minutes    Irine Seal, MD [facility]  If 7PM-7AM, please contact night-coverage [email or web address] 01/10/2019, 9:58 AM

## 2019-01-11 DIAGNOSIS — B961 Klebsiella pneumoniae [K. pneumoniae] as the cause of diseases classified elsewhere: Secondary | ICD-10-CM

## 2019-01-11 DIAGNOSIS — A498 Other bacterial infections of unspecified site: Secondary | ICD-10-CM

## 2019-01-11 LAB — BASIC METABOLIC PANEL
Anion gap: 6 (ref 5–15)
BUN: 31 mg/dL — ABNORMAL HIGH (ref 8–23)
CO2: 19 mmol/L — ABNORMAL LOW (ref 22–32)
Calcium: 8.1 mg/dL — ABNORMAL LOW (ref 8.9–10.3)
Chloride: 114 mmol/L — ABNORMAL HIGH (ref 98–111)
Creatinine, Ser: 1.69 mg/dL — ABNORMAL HIGH (ref 0.61–1.24)
GFR calc Af Amer: 41 mL/min — ABNORMAL LOW (ref >60)
GFR calc non Af Amer: 36 mL/min — ABNORMAL LOW (ref >60)
Glucose, Bld: 108 mg/dL — ABNORMAL HIGH (ref 70–99)
Potassium: 3.9 mmol/L (ref 3.5–5.1)
Sodium: 139 mmol/L (ref 135–145)

## 2019-01-11 LAB — CBC WITH DIFFERENTIAL/PLATELET
Abs Immature Granulocytes: 0.2 10*3/uL — ABNORMAL HIGH (ref 0.00–0.07)
Basophils Absolute: 0 10*3/uL (ref 0.0–0.1)
Basophils Relative: 1 %
Eosinophils Absolute: 0.2 10*3/uL (ref 0.0–0.5)
Eosinophils Relative: 2 %
HCT: 26.3 % — ABNORMAL LOW (ref 39.0–52.0)
Hemoglobin: 8.3 g/dL — ABNORMAL LOW (ref 13.0–17.0)
Immature Granulocytes: 2 %
Lymphocytes Relative: 15 %
Lymphs Abs: 1.3 10*3/uL (ref 0.7–4.0)
MCH: 28.9 pg (ref 26.0–34.0)
MCHC: 31.6 g/dL (ref 30.0–36.0)
MCV: 91.6 fL (ref 80.0–100.0)
Monocytes Absolute: 1 10*3/uL (ref 0.1–1.0)
Monocytes Relative: 11 %
Neutro Abs: 6 10*3/uL (ref 1.7–7.7)
Neutrophils Relative %: 69 %
Platelets: 166 10*3/uL (ref 150–400)
RBC: 2.87 MIL/uL — ABNORMAL LOW (ref 4.22–5.81)
RDW: 15.9 % — ABNORMAL HIGH (ref 11.5–15.5)
WBC: 8.8 10*3/uL (ref 4.0–10.5)
nRBC: 0 % (ref 0.0–0.2)

## 2019-01-11 LAB — MAGNESIUM: Magnesium: 2.5 mg/dL — ABNORMAL HIGH (ref 1.7–2.4)

## 2019-01-11 MED ORDER — ASPIRIN 81 MG PO TBEC: 81.0000 mg | DELAYED_RELEASE_TABLET | Freq: Every day | ORAL | Status: DC

## 2019-01-11 MED ORDER — AZITHROMYCIN 250 MG PO TABS
500.0000 mg | ORAL_TABLET | Freq: Every day | ORAL | Status: DC
  Administered 2019-01-11: 500 mg via ORAL
  Filled 2019-01-11: qty 2

## 2019-01-11 MED ORDER — AZITHROMYCIN 250 MG PO TABS: 250.0000 mg | ORAL_TABLET | Freq: Every day | ORAL | 0 refills | Status: AC

## 2019-01-11 MED ORDER — TAMSULOSIN HCL 0.4 MG PO CAPS: 0.4000 mg | ORAL_CAPSULE | Freq: Every day | ORAL | 1 refills | Status: DC

## 2019-01-11 MED ORDER — ACETAMINOPHEN 325 MG PO TABS: 650.0000 mg | ORAL_TABLET | Freq: Four times a day (QID) | ORAL | Status: AC | PRN

## 2019-01-11 MED ORDER — AMLODIPINE BESYLATE 10 MG PO TABS
10.0000 mg | ORAL_TABLET | Freq: Every day | ORAL | Status: DC
  Administered 2019-01-11: 10 mg via ORAL
  Filled 2019-01-11: qty 1

## 2019-01-11 MED ORDER — SODIUM BICARBONATE 650 MG PO TABS: 650.0000 mg | ORAL_TABLET | Freq: Two times a day (BID) | ORAL | 0 refills | Status: DC

## 2019-01-11 MED ORDER — ENSURE ENLIVE PO LIQD: 237.0000 mL | Freq: Two times a day (BID) | ORAL | 0 refills | Status: DC

## 2019-01-11 MED ORDER — CEFDINIR 300 MG PO CAPS: 300.0000 mg | ORAL_CAPSULE | Freq: Two times a day (BID) | ORAL | 0 refills | Status: AC

## 2019-01-11 MED ORDER — AMLODIPINE BESYLATE 10 MG PO TABS: 10.0000 mg | ORAL_TABLET | Freq: Every day | ORAL | 0 refills | Status: DC

## 2019-01-11 MED ORDER — TRAMADOL HCL 50 MG PO TABS: 50.0000 mg | ORAL_TABLET | Freq: Every day | ORAL | 0 refills | Status: DC

## 2019-01-11 MED ORDER — VITAMIN B-12 1000 MCG PO TABS: 1000.0000 ug | ORAL_TABLET | Freq: Every day | ORAL | Status: DC

## 2019-01-11 MED ORDER — SODIUM BICARBONATE 650 MG PO TABS
650.0000 mg | ORAL_TABLET | Freq: Two times a day (BID) | ORAL | Status: DC
  Administered 2019-01-11: 650 mg via ORAL
  Filled 2019-01-11: qty 1

## 2019-01-11 MED ORDER — CEFDINIR 300 MG PO CAPS
300.0000 mg | ORAL_CAPSULE | Freq: Two times a day (BID) | ORAL | Status: DC
  Administered 2019-01-11: 300 mg via ORAL
  Filled 2019-01-11 (×2): qty 1

## 2019-01-11 NOTE — Progress Notes (Signed)
Discharge report called to supervisor at [facility]. Awaiting PTAR for transportation. Eulas Post, RN

## 2019-01-11 NOTE — Evaluation (Addendum)
Occupational Therapy Evaluation Patient Details Name: Manuel King MRN: [id number] DOB: 07/20/1929 Today's Date: 01/11/2019    History of Present Illness Pt is an 85 year old male admitted for sepsis likely secondary to UTI and probable pneumonia with PMHx significant for dementia, HTN, prostate cancer and CVA   Clinical Impression   Pt was admitted for the above. He is from [place].  Unsure how much assistance he needed. He currently needs mod A to stand and has a posterior lean.  Could not take steps due to diarrhea. Pt needs up to total A for hygiene/LB dressing. Will follow in acute setting focusing on mobility components of ADLs initially    Follow Up Recommendations  Other (comment);SNF(ALF with HHOT, if [place] can provide needed A, otherwise SNF)    Equipment Recommendations  3 in 1 bedside commode    Recommendations for Other Services       Precautions / Restrictions Precautions Precautions: Fall Precaution Comments: residual deficits left extremities Restrictions Weight Bearing Restrictions: No      Mobility Bed Mobility Overal bed mobility: Needs Assistance Bed Mobility: Supine to Sit;Sit to Supine     Supine to sit: Mod assist;HOB elevated Sit to supine: Mod assist   General bed mobility comments: verbal cues for technique, pt attempting to assist however more assist required for left side, pt to EOB and then started to return to supine on his own (BM observed on bed pad, nursing notified); pt perseverating on calling someone  Transfers Overall transfer level: Needs assistance Equipment used: Rolling walker (2 wheeled) Transfers: Sit to/from Stand Sit to Stand: Mod assist         General transfer comment: assist to power up and stabilize; pt tends to lean posteriorly    Balance Overall balance assessment: Needs assistance Sitting-balance support: Feet supported   Sitting balance - Comments: initially poor balance with posterior LOB sitting EOB  progressed to fair     Standing balance-Leahy Scale: Poor Standing balance comment: needs bil UEs on RW                           ADL either performed or assessed with clinical judgement   ADL Overall ADL's : Needs assistance/impaired Eating/Feeding: Set up   Grooming: Set up;Wash/dry face   Upper Body Bathing: Set up   Lower Body Bathing: Moderate assistance   Upper Body Dressing : Minimal assistance   Lower Body Dressing: Maximal assistance                 General ADL Comments: stood only. Pt with diarrhea; assisted in cleaning from supine     Vision         Perception     Praxis      Pertinent Vitals/Pain Pain Assessment: No/denies pain     Hand Dominance Right   Extremity/Trunk Assessment Upper Extremity Assessment Upper Extremity Assessment: Generalized weakness;RUE deficits/detail RUE Deficits / Details: slight tremor.  Used it to self feed; used L to help pull himself up in bed; limited shoulder ROM   Lower Extremity Assessment Lower Extremity Assessment: Generalized weakness;LLE deficits/detail LLE Deficits / Details: residual deficits since CVA, at least 2+/5 throughout       Communication Communication Communication: HOH   Cognition Arousal/Alertness: Awake/alert Behavior During Therapy: Flat affect Overall Cognitive Status: History of cognitive impairments - at baseline  General Comments       Exercises     Shoulder Instructions      Home Living Family/patient expects to be discharged to:: Assisted living                             Home Equipment: Walker - 2 wheels          Prior Functioning/Environment Level of Independence: Needs assistance  Gait / Transfers Assistance Needed: pt reports ambulating with walker ADL's / Homemaking Assistance Needed: reports staff assisted with ADLs   Comments: unsure of how much assistance he needed        OT  Problem List: Decreased strength;Decreased activity tolerance;Impaired balance (sitting and/or standing);Decreased safety awareness;Decreased knowledge of use of DME or AE      OT Treatment/Interventions: Self-care/ADL training;DME and/or AE instruction;Patient/family education;Balance training;Therapeutic activities    OT Goals(Current goals can be found in the care plan section) Acute Rehab OT Goals Patient Stated Goal: move back into his own home OT Goal Formulation: With patient Time For Goal Achievement: 01/25/19 Potential to Achieve Goals: Good ADL Goals Pt Will Transfer to Toilet: with min assist;bedside commode;stand pivot transfer Additional ADL Goal #1: pt will go from sit to stand with min A during adls and maintain x 2 minutes with min A Additional ADL Goal #2: pt will perform bed mobility at min A in preparation for adls  OT Frequency: Min 2X/week   Barriers to D/C:            Co-evaluation              AM-PAC OT "6 Clicks" Daily Activity     Outcome Measure Help from another person eating meals?: A Little Help from another person taking care of personal grooming?: A Little Help from another person toileting, which includes using toliet, bedpan, or urinal?: Total Help from another person bathing (including washing, rinsing, drying)?: A Lot Help from another person to put on and taking off regular upper body clothing?: A Little Help from another person to put on and taking off regular lower body clothing?: Total 6 Click Score: 13   End of Session    Activity Tolerance: (limited by diarrhea) Patient left: in bed;with call bell/phone within reach;with bed alarm set  OT Visit Diagnosis: Unsteadiness on feet (R26.81);Muscle weakness (generalized) (M62.81)                Time: [id number] OT Time Calculation (min): 21 min Charges:  OT General Charges $OT Visit: 1 Visit OT Evaluation $OT Eval Low Complexity: Tensed, OTR/L Acute Rehabilitation  Services [phone] [facility] pager [phone] office 01/11/2019  [place] 01/11/2019, 1:17 PM

## 2019-01-11 NOTE — Consult Note (Signed)
   [facility] CM Inpatient Consult   01/11/2019  Manuel King [birth year] [id number]   Patient screened for potential [facility] Management services. Patient is in the [facility] Management services under patient's [employer] plan.    Per chart review, patient current resident at [facility] [place]. Current disposition plan is for SNF. No [facility] CM needs.  Netta Cedars, MSN, [facility] Liaison Nurse Mobile Phone [phone]  Toll free office [phone]

## 2019-01-11 NOTE — NC FL2 (Signed)
[place] LEVEL OF CARE SCREENING TOOL     IDENTIFICATION  Patient Name: Manuel King Birthdate: March 01, 1930 Sex: male Admission Date (Current Location): 01/07/2019  [facility] and [place] Number:  Herbalist and Address:  [facility],  [place], [place]      Provider Number: [id number]  Attending Physician Name and Address:  Eugenie Filler, MD  Relative Name and Phone Number:       Current Level of Care: Hospital Recommended Level of Care: [place] Prior Approval Number:    Date Approved/Denied:   PASRR Number:    Discharge Plan: Other (Comment)(ALF)    Current Diagnoses: Patient Active Problem List   Diagnosis Date Noted  . Klebsiella pneumoniae infection   . Low vitamin B12 level 01/09/2019  . Acute lower UTI 01/09/2019  . HLD (hyperlipidemia) 08/18/2017  . Dementia ([place]) 08/18/2017  . Iron deficiency anemia 08/18/2017  . Acute renal failure superimposed on stage 3 chronic kidney disease ([place]) 08/18/2017  . Sepsis ([place]) 08/18/2017  . Urinary urgency 01/25/2015  . Ambulatory dysfunction 12/21/2014  . Generalized weakness 07/18/2014  . Fall 07/18/2014  . Malnutrition of moderate degree ([place]) 02/03/2014  . Bradycardia 02/01/2014  . Gout 09/16/2011  . Community acquired pneumonia 09/16/2011  . AKI (acute kidney injury) ([place]) 09/12/2011  . Falls 09/12/2011  . Acute parotitis 05/10/2011  . History of CVA (cerebrovascular accident) 05/10/2011  . Vascular dementia ([place]) 05/10/2011  . SLEEP APNEA 11/16/2008  . BRADYCARDIA 09/29/2008  . FATIGUE, CHRONIC 09/29/2008  . Essential hypertension 09/28/2008  . PROSTATE CANCER, HX OF 09/28/2008    Orientation RESPIRATION BLADDER Height & Weight     Self, Place  Normal Incontinent Weight: 94.1 kg Height:  6\' 4"  (193 cm)  BEHAVIORAL SYMPTOMS/MOOD NEUROLOGICAL BOWEL NUTRITION STATUS      Incontinent Diet(Heart Healthy)  AMBULATORY STATUS  COMMUNICATION OF NEEDS Skin   Extensive Assist Verbally Normal                       Personal Care Assistance Level of Assistance  Bathing, Feeding, Dressing Bathing Assistance: Maximum assistance Feeding assistance: Independent Dressing Assistance: Maximum assistance     Functional Limitations Info  Hearing(HOH)   Hearing Info: Impaired(HOH)      SPECIAL CARE FACTORS FREQUENCY                       Contractures Contractures Info: Not present    Additional Factors Info  Code Status, Allergies(FULL) Code Status Info: FULL Allergies Info: No Known Allergies           Current Medications (01/11/2019):  This is the current hospital active medication list Current Facility-Administered Medications  Medication Dose Route Frequency Provider Last Rate Last Dose  . acetaminophen (TYLENOL) tablet 650 mg  650 mg Oral Q6H PRN Rise Patience, MD   650 mg at 01/10/19 2032   Or  . acetaminophen (TYLENOL) suppository 650 mg  650 mg Rectal Q6H PRN Rise Patience, MD      . allopurinol (ZYLOPRIM) tablet 100 mg  100 mg Oral Daily Rise Patience, MD   100 mg at 01/11/19 [id number]  . amLODipine (NORVASC) tablet 10 mg  10 mg Oral Daily Eugenie Filler, MD   10 mg at 01/11/19 [id number]  . aspirin EC tablet 81 mg  81 mg Oral Daily Eugenie Filler, MD   81 mg at 01/11/19 [id number]  .  azithromycin (ZITHROMAX) tablet 500 mg  500 mg Oral Daily Eugenie Filler, MD      . bisacodyl (DULCOLAX) suppository 10 mg  10 mg Rectal Daily PRN Eugenie Filler, MD      . cefdinir (OMNICEF) capsule 300 mg  300 mg Oral Q12H Eugenie Filler, MD   300 mg at 01/11/19 0935  . cyanocobalamin ((VITAMIN B-12)) injection 1,000 mcg  1,000 mcg Intramuscular Daily Eugenie Filler, MD   1,000 mcg at 01/11/19 [id number]  . divalproex (DEPAKOTE) DR tablet 250 mg  250 mg Oral Daily Rise Patience, MD   250 mg at 01/11/19 [id number]  . divalproex (DEPAKOTE) DR tablet 500 mg  500 mg Oral QHS Rise Patience, MD   500 mg at 01/10/19 2121  . docusate sodium (COLACE) capsule 100 mg  100 mg Oral Daily Rise Patience, MD   100 mg at 01/11/19 0929  . donepezil (ARICEPT) tablet 10 mg  10 mg Oral QHS Rise Patience, MD   10 mg at 01/10/19 2121  . feeding supplement (BOOST / RESOURCE BREEZE) liquid 1 Container  1 Container Oral Q24H Eugenie Filler, MD   1 Container at 01/09/19 1426  . feeding supplement (ENSURE ENLIVE) (ENSURE ENLIVE) liquid 237 mL  237 mL Oral BID BM Eugenie Filler, MD   237 mL at 01/10/19 2033  . ferrous sulfate tablet 325 mg  325 mg Oral BID Rise Patience, MD   325 mg at 01/11/19 [id number]  . hydrALAZINE (APRESOLINE) injection 10 mg  10 mg Intravenous Q4H PRN Rise Patience, MD      . hydrALAZINE (APRESOLINE) tablet 25 mg  25 mg Oral Q8H Rise Patience, MD   25 mg at 01/11/19 [id number]  . ondansetron (ZOFRAN) tablet 4 mg  4 mg Oral Q6H PRN Rise Patience, MD       Or  . ondansetron [facility]) injection 4 mg  4 mg Intravenous Q6H PRN Rise Patience, MD      . oxybutynin (DITROPAN) tablet 5 mg  5 mg Oral TID Rise Patience, MD   5 mg at 01/11/19 0929  . PARoxetine (PAXIL) tablet 20 mg  20 mg Oral Daily Rise Patience, MD   20 mg at 01/11/19 [id number]  . phenol (CHLORASEPTIC) mouth spray 1 spray  1 spray Mouth/Throat PRN Eugenie Filler, MD   1 spray at 01/10/19 2121  . polyvinyl alcohol (LIQUIFILM TEARS) 1.4 % ophthalmic solution 2 drop  2 drop Both Eyes BID Rise Patience, MD   2 drop at 01/11/19 [id number]  . pravastatin (PRAVACHOL) tablet 40 mg  40 mg Oral QHS Rise Patience, MD   40 mg at 01/10/19 2121  . sodium bicarbonate tablet 650 mg  650 mg Oral BID Eugenie Filler, MD   650 mg at 01/11/19 [id number]  . tamsulosin (FLOMAX) capsule 0.4 mg  0.4 mg Oral QPC breakfast Eugenie Filler, MD   0.4 mg at 01/11/19 0929  . traMADol (ULTRAM) tablet 50 mg  50 mg Oral Daily Rise Patience, MD   50 mg at 01/11/19 [id number]      Discharge Medications: Please see discharge summary for a list of discharge medications.  Relevant Imaging Results:  Relevant Lab Results:   Additional Information [id number]  Purcell Mouton, RN

## 2019-01-11 NOTE — Care Management Important Message (Signed)
Important Message  Patient Details  Name: Manuel King MRN: [id number] Date of Birth: [birth year]   Medicare Important Message Given:  Yes. CMA printed out IM for Case Management Nurse or CSW to give to patient.      Charae Depaolis 01/11/2019, 10:51 AM

## 2019-01-11 NOTE — TOC Progression Note (Signed)
Transition of Care [facility]) - Progression Note    Patient Details  Name: Manuel King MRN: [id number] Date of Birth: Mar 31, 1930  Transition of Care [facility]) CM/SW Contact  Purcell Mouton, RN Phone Number: 01/11/2019, 3:49 PM  Clinical Narrative:    Spoke with pt's daughter Mardene Celeste concerning pt going back to [place] by [employer].         Expected Discharge Plan and Services           Expected Discharge Date: 01/11/19                                     Social Determinants of Health (SDOH) Interventions    Readmission Risk Interventions No flowsheet data found.

## 2019-01-11 NOTE — NC FL2 (Deleted)
[place] LEVEL OF CARE SCREENING TOOL     IDENTIFICATION  Patient Name: Manuel King Birthdate: [birth year] Sex: male Admission Date (Current Location): 01/07/2019  [facility] and [place] Number:  Herbalist and Address:  [facility],  [place], [place]      Provider Number: [id number]  Attending Physician Name and Address:  Eugenie Filler, MD  Relative Name and Phone Number:       Current Level of Care: Hospital Recommended Level of Care: [place] Prior Approval Number:    Date Approved/Denied:   PASRR Number:    Discharge Plan: Other (Comment)(ALF)    Current Diagnoses: Patient Active Problem List   Diagnosis Date Noted  . Klebsiella pneumoniae infection   . Low vitamin B12 level 01/09/2019  . Acute lower UTI 01/09/2019  . HLD (hyperlipidemia) 08/18/2017  . Dementia ([place]) 08/18/2017  . Iron deficiency anemia 08/18/2017  . Acute renal failure superimposed on stage 3 chronic kidney disease ([place]) 08/18/2017  . Sepsis ([place]) 08/18/2017  . Urinary urgency 01/25/2015  . Ambulatory dysfunction 12/21/2014  . Generalized weakness 07/18/2014  . Fall 07/18/2014  . Malnutrition of moderate degree ([place]) 02/03/2014  . Bradycardia 02/01/2014  . Gout 09/16/2011  . Community acquired pneumonia 09/16/2011  . AKI (acute kidney injury) ([place]) 09/12/2011  . Falls 09/12/2011  . Acute parotitis 05/10/2011  . History of CVA (cerebrovascular accident) 05/10/2011  . Vascular dementia ([place]) 05/10/2011  . SLEEP APNEA 11/16/2008  . BRADYCARDIA 09/29/2008  . FATIGUE, CHRONIC 09/29/2008  . Essential hypertension 09/28/2008  . PROSTATE CANCER, HX OF 09/28/2008    Orientation RESPIRATION BLADDER Height & Weight     Self, Place  Normal Incontinent Weight: 94.1 kg Height:  6\' 4"  (193 cm)  BEHAVIORAL SYMPTOMS/MOOD NEUROLOGICAL BOWEL NUTRITION STATUS      Incontinent Diet(Heart Healthy)  AMBULATORY STATUS  COMMUNICATION OF NEEDS Skin   Extensive Assist Verbally Normal                       Personal Care Assistance Level of Assistance  Bathing, Feeding, Dressing Bathing Assistance: Maximum assistance Feeding assistance: Independent Dressing Assistance: Maximum assistance     Functional Limitations Info  Hearing(HOH)   Hearing Info: Impaired(HOH)      SPECIAL CARE FACTORS FREQUENCY                       Contractures Contractures Info: Not present    Additional Factors Info  Code Status, Allergies(FULL) Code Status Info: FULL Allergies Info: No Known Allergies           Current Medications (01/11/2019):  This is the current hospital active medication list Current Facility-Administered Medications  Medication Dose Route Frequency Provider Last Rate Last Dose  . acetaminophen (TYLENOL) tablet 650 mg  650 mg Oral Q6H PRN Rise Patience, MD   650 mg at 01/10/19 2032   Or  . acetaminophen (TYLENOL) suppository 650 mg  650 mg Rectal Q6H PRN Rise Patience, MD      . allopurinol (ZYLOPRIM) tablet 100 mg  100 mg Oral Daily Rise Patience, MD   100 mg at 01/11/19 [id number]  . amLODipine (NORVASC) tablet 10 mg  10 mg Oral Daily Eugenie Filler, MD   10 mg at 01/11/19 [id number]  . aspirin EC tablet 81 mg  81 mg Oral Daily Eugenie Filler, MD   81 mg at 01/11/19 [id number]  .  azithromycin (ZITHROMAX) tablet 500 mg  500 mg Oral Daily Eugenie Filler, MD      . bisacodyl (DULCOLAX) suppository 10 mg  10 mg Rectal Daily PRN Eugenie Filler, MD      . cefdinir (OMNICEF) capsule 300 mg  300 mg Oral Q12H Eugenie Filler, MD   300 mg at 01/11/19 0935  . cyanocobalamin ((VITAMIN B-12)) injection 1,000 mcg  1,000 mcg Intramuscular Daily Eugenie Filler, MD   1,000 mcg at 01/11/19 [id number]  . divalproex (DEPAKOTE) DR tablet 250 mg  250 mg Oral Daily Rise Patience, MD   250 mg at 01/11/19 [id number]  . divalproex (DEPAKOTE) DR tablet 500 mg  500 mg Oral QHS Rise Patience, MD   500 mg at 01/10/19 2121  . docusate sodium (COLACE) capsule 100 mg  100 mg Oral Daily Rise Patience, MD   100 mg at 01/11/19 0929  . donepezil (ARICEPT) tablet 10 mg  10 mg Oral QHS Rise Patience, MD   10 mg at 01/10/19 2121  . feeding supplement (BOOST / RESOURCE BREEZE) liquid 1 Container  1 Container Oral Q24H Eugenie Filler, MD   1 Container at 01/09/19 1426  . feeding supplement (ENSURE ENLIVE) (ENSURE ENLIVE) liquid 237 mL  237 mL Oral BID BM Eugenie Filler, MD   237 mL at 01/10/19 2033  . ferrous sulfate tablet 325 mg  325 mg Oral BID Rise Patience, MD   325 mg at 01/11/19 [id number]  . hydrALAZINE (APRESOLINE) injection 10 mg  10 mg Intravenous Q4H PRN Rise Patience, MD      . hydrALAZINE (APRESOLINE) tablet 25 mg  25 mg Oral Q8H Rise Patience, MD   25 mg at 01/11/19 [id number]  . ondansetron (ZOFRAN) tablet 4 mg  4 mg Oral Q6H PRN Rise Patience, MD       Or  . ondansetron [facility]) injection 4 mg  4 mg Intravenous Q6H PRN Rise Patience, MD      . oxybutynin (DITROPAN) tablet 5 mg  5 mg Oral TID Rise Patience, MD   5 mg at 01/11/19 0929  . PARoxetine (PAXIL) tablet 20 mg  20 mg Oral Daily Rise Patience, MD   20 mg at 01/11/19 [id number]  . phenol (CHLORASEPTIC) mouth spray 1 spray  1 spray Mouth/Throat PRN Eugenie Filler, MD   1 spray at 01/10/19 2121  . polyvinyl alcohol (LIQUIFILM TEARS) 1.4 % ophthalmic solution 2 drop  2 drop Both Eyes BID Rise Patience, MD   2 drop at 01/11/19 [id number]  . pravastatin (PRAVACHOL) tablet 40 mg  40 mg Oral QHS Rise Patience, MD   40 mg at 01/10/19 2121  . sodium bicarbonate tablet 650 mg  650 mg Oral BID Eugenie Filler, MD   650 mg at 01/11/19 [id number]  . tamsulosin (FLOMAX) capsule 0.4 mg  0.4 mg Oral QPC breakfast Eugenie Filler, MD   0.4 mg at 01/11/19 0929  . traMADol (ULTRAM) tablet 50 mg  50 mg Oral Daily Rise Patience, MD   50 mg at 01/11/19 [id number]      Discharge Medications: Please see discharge summary for a list of discharge medications.  Relevant Imaging Results:  Relevant Lab Results:   Additional Information [id number]  Purcell Mouton, RN

## 2019-01-11 NOTE — Discharge Summary (Signed)
Physician Discharge Summary  Manuel King [id number] DOB: 1929-07-26 DOA: 01/07/2019  PCP: Merrilee Seashore, MD  Admit date: 01/07/2019 Discharge date: 01/11/2019  Time spent: 60 minutes  Recommendations for Outpatient Follow-up:  1. Follow-up with MD at skilled nursing facility.  Patient will need a basic metabolic profile done in 1 week as well as a CBC to follow-up on electrolytes renal function and counts.  Patient's blood pressure also need to be reassessed as patient's antihypertensive medications were adjusted. 2. Follow-up with [facility] in 1 week for evaluation of urinary retention and voiding trial.  Patient was discharged with Foley catheter.   Discharge Diagnoses:  Principal Problem:   Sepsis ([place]) Active Problems:   Acute lower UTI   Essential hypertension   History of CVA (cerebrovascular accident)   Vascular dementia ([place])   Community acquired pneumonia   Generalized weakness   HLD (hyperlipidemia)   Iron deficiency anemia   Acute renal failure superimposed on stage 3 chronic kidney disease (HCC)   Low vitamin B12 level   Klebsiella pneumoniae infection   Discharge Condition: Stable and improved  Diet recommendation: Heart healthy  Filed Weights   01/07/19 2217 01/09/19 1429 01/11/19 0500  Weight: 99.6 kg 94.9 kg 94.1 kg    History of present illness:  Per Dr Manuel King is a 85 y.o. male with history of dementia, CVA with right-sided weakness, hypertension, chronic kidney disease stage III with baseline creatinine around 1.4-1.9, history of prostate cancer was brought to the ER after patient was found to have a fever of 102 degrees at the living facility and also was found to be generally weak.  Patient was found to be more lethargic.    ED Course: In the ER patient was oriented to his name and place.  Follows commands.  Denies any chest pain shortness of breath.  Patient states he did notice some blood in the urine.  Patient was  febrile with temperature around 102 F tachycardic with elevated procalcitonin levels.  UA is consistent with UTI also.  CT of the abdomen was done which shows features concerning for cystitis.  Patient was started on fluids for sepsis and empiric antibiotics.  Patient's creatinine has increased from 1.93 days ago to 2.7.  Patient has leukocytosis with normal lactate.  COVID-19 test was negative.  Hospital Course:  1 sepsis likely secondary to UTI and probable pneumonia Patient presented with fevers, leukocytosis, acute on chronic kidney disease stage III, elevated procalcitonin levels.  Urinalysis consistent with a UTI.  CT abdomen and pelvis which was done was concerning for cystitis and some perinephric stranding bilaterally.  Chest x-ray done concerning for atelectasis versus infiltrate.  Patient remained afebrile.  Procalcitonin trended down.  Lactic acid level trended down.  Leukocytosis trending down and had resolved by day of discharge.  Patient has been pancultured results pending.  Patient placed empirically on IV Rocephin and azithromycin on admission. Urine cultures with > 100,000 colonies of Klebsiella pneumonia and Aerococcus viridans.  Sensitivities for Aerococcus viridans pending.    Patient improved clinically.  IV Rocephin and IV azithromycin with transition to oral Vantin and azithromycin.  Patient be discharged on 4 days of oral Vantin and 2 days of oral azithromycin.  Outpatient follow-up.    2.  Acute renal failure on chronic kidney disease stage III Likely secondary to a prerenal azotemia in the setting of ARB and diuretic.  Renal function improved with hydration.    ARB and HCTZ were held and  discontinued on discharge.  Renal function improved and was back to baseline by day of discharge.  Creatinine was down to 1.69.  Outpatient follow-up.    3.  Hypertension Patient's ARB and HCTZ were held secondary to acute renal failure on chronic kidney disease stage III.  Blood pressure  improved on admission.  Patient was subsequently placed back on home regimen of hydralazine.  Home dose Norvasc was increased to 10 mg daily.  Blood pressure was better controlled.  Patient will be discharged on hydralazine and Norvasc only.  HCTZ and ARB have been discontinued.  Outpatient follow-up.   4.  Severe iron deficiency anemia/low vitamin B12 levels Patient with no overt bleeding.  Patient noted to have 11-20 RBCs.  Hemoglobin remained stable at 8.3(on day of discharge) from 8.5 from 8.4 from 10.3 on 01/04/2019.  Anemia panel with iron level of less than 5, TIBC of 157, folate of 12.2, ferritin of 299, vitamin B12 of 202.  Status post IV Feraheme x1.  Patient was placed on vitamin B12 1000 MCG subcutaneously daily will be discharged on oral vitamin B12 1000 MCG's daily.  Hemoglobin remained stable.  Outpatient follow-up.   5.  Hyperlipidemia Continue statin.  6.  Vascular dementia Patient maintained on Aricept.    7.  Depression Patient maintained on home regimen of Depakote and Paxil.    9.  History of gout Patient maintained on allopurinol.  Outpatient follow-up.   10.  History of probable nonhemorrhagic CVA Stable.  Patient with anemia.  Patient with no overt bleeding.  Patient started on aspirin 81 mg daily for secondary stroke prophylaxis.  Patient maintained on a statin.  Outpatient follow-up.  11.  Urinary retention Postvoid residual every 4 hours was ordered yesterday however only done once. PVR noted to be greater than 400.  Patient started on Flomax.  Foley catheter placed.  Outpatient follow-up with urology.   Procedures:  CT renal stone protocol 01/08/2019  Chest x-ray 01/07/2019   Consultations:  None  Discharge Exam: Vitals:   01/10/19 2035 01/11/19 0420  BP: (!) 161/60 (!) 150/56  Pulse: 100 87  Resp: 18 18  Temp: 98.9 F (37.2 C) 98.7 F (37.1 C)  SpO2: 97% 98%    General: NAD Cardiovascular: RRR Respiratory: CTAB  Discharge  Instructions   Discharge Instructions    Diet - low sodium heart healthy   Complete by: As directed    Increase activity slowly   Complete by: As directed      Allergies as of 01/11/2019   No Known Allergies     Medication List    STOP taking these medications   amoxicillin-clavulanate 875-125 MG tablet Commonly known as: AUGMENTIN   hydrochlorothiazide 12.5 MG tablet Commonly known as: HYDRODIURIL   irbesartan 150 MG tablet Commonly known as: AVAPRO     TAKE these medications   acetaminophen 325 MG tablet Commonly known as: TYLENOL Take 2 tablets (650 mg total) by mouth every 6 (six) hours as needed for mild pain (or Fever >/= 101). What changed:   medication strength  how much to take  when to take this  reasons to take this   allopurinol 100 MG tablet Commonly known as: ZYLOPRIM Take 100 mg by mouth daily.   amLODipine 10 MG tablet Commonly known as: NORVASC Take 1 tablet (10 mg total) by mouth daily. What changed:   medication strength  how much to take   aspirin 81 MG EC tablet Take 1 tablet (81 mg total) by  mouth daily. Start taking on: January 12, 2019   azithromycin 250 MG tablet Commonly known as: ZITHROMAX Take 1 tablet (250 mg total) by mouth daily for 2 days.   cefdinir 300 MG capsule Commonly known as: OMNICEF Take 1 capsule (300 mg total) by mouth every 12 (twelve) hours for 4 days.   divalproex 250 MG DR tablet Commonly known as: DEPAKOTE Take 250-500 mg by mouth See admin instructions. Take 250 mg  tablet in the am and 500 mg  tablets in the pm   docusate sodium 100 MG capsule Commonly known as: COLACE Take 100 mg by mouth daily.   donepezil 10 MG tablet Commonly known as: ARICEPT Take 10 mg by mouth at bedtime.   feeding supplement (ENSURE ENLIVE) Liqd Take 237 mLs by mouth 2 (two) times daily between meals.   ferrous sulfate 325 (65 FE) MG EC tablet Take 325 mg by mouth 2 (two) times daily.   hydrALAZINE 25 MG  tablet Commonly known as: APRESOLINE Take 1 tablet (25 mg total) by mouth every 8 (eight) hours.   oxybutynin 5 MG tablet Commonly known as: DITROPAN Take 5 mg by mouth 3 (three) times daily.   PARoxetine 20 MG tablet Commonly known as: PAXIL Take 20 mg by mouth daily.   pravastatin 40 MG tablet Commonly known as: PRAVACHOL Take 40 mg by mouth at bedtime.   sodium bicarbonate 650 MG tablet Take 1 tablet (650 mg total) by mouth 2 (two) times daily.   Systane 0.4-0.3 % Soln Generic drug: Polyethyl Glycol-Propyl Glycol Place 2 drops into both eyes 2 (two) times daily.   tamsulosin 0.4 MG Caps capsule Commonly known as: FLOMAX Take 1 capsule (0.4 mg total) by mouth daily after breakfast. Start taking on: January 12, 2019   traMADol 50 MG tablet Commonly known as: ULTRAM Take 1 tablet (50 mg total) by mouth daily.   vitamin B-12 1000 MCG tablet Commonly known as: CYANOCOBALAMIN Take 1 tablet (1,000 mcg total) by mouth daily.      No Known Allergies Follow-up Information    MD AT SNF Follow up.        [facility]. Schedule an appointment as soon as possible for a visit in 1 week(s).   Contact information: [place] [phone]           The results of significant diagnostics from this hospitalization (including imaging, microbiology, ancillary and laboratory) are listed below for reference.    Significant Diagnostic Studies: Dg Chest Port 1 View  Result Date: 01/07/2019 CLINICAL DATA:  Fever EXAM: PORTABLE CHEST 1 VIEW COMPARISON:  08/18/2017 FINDINGS: Low lung volumes. Patchy airspace opacity at the right infrahilar lung. Borderline cardiomegaly with aortic atherosclerosis. No pneumothorax. IMPRESSION: Low lung volumes. Patchy atelectasis or small infiltrate at the right infrahilar lung Electronically Signed   By: Donavan Foil M.D.   On: 01/07/2019 21:52   Ct Renal Stone Study  Result Date:  01/08/2019 CLINICAL DATA:  Hematuria EXAM: CT ABDOMEN AND PELVIS WITHOUT CONTRAST TECHNIQUE: Multidetector CT imaging of the abdomen and pelvis was performed following the standard protocol without IV contrast. COMPARISON:  None. FINDINGS: Lower chest: There is mild cardiomegaly. No hiatal hernia. The visualized portions of the lungs are clear. Hepatobiliary: There is a 2.3 cm low-density lesion seen within the right liver lobe. No evidence of calcified gallstones or biliary ductal dilatation. Pancreas:  Unremarkable.  No surrounding inflammatory changes. Spleen: Normal in size. Although limited  due to the lack of intravenous contrast, normal in appearance. Adrenals/Urinary Tract: Both adrenal glands appear normal. There is atrophy with bilateral perinephric stranding. No renal calculi are seen. No hydronephrosis. There appears to be mild bladder wall thickening with surrounding mild fat stranding changes. Stomach/Bowel: The stomach, small bowel, and colon are normal in appearance. No inflammatory changes or obstructive findings. There is a moderate amount of colonic stool. The appendix is not seen. Vascular/Lymphatic: There are no enlarged abdominal or pelvic lymph nodes. Scattered aortic atherosclerotic calcifications are seen without aneurysmal dilatation. Reproductive: Radiation seeds are seen within the prostate. Other: Small bilateral fat containing inguinal hernias are seen. Musculoskeletal: No acute or significant osseous findings. Degenerative changes are seen in the lower lumbar spine is IMPRESSION: Mild diffuse bladder wall thickening with adjacent inflammatory changes. This could be due to cystitis. Please correlate with the patient's clinical laboratory exam. Bilateral renal cortical thinning with perinephric stranding. No hydronephrosis. Electronically Signed   By: Prudencio Pair M.D.   On: 01/08/2019 03:25    Microbiology: Recent Results (from the past 240 hour(s))  Culture, blood (Routine x 2)      Status: None (Preliminary result)   Collection Time: 01/07/19  9:30 PM   Specimen: BLOOD  Result Value Ref Range Status   Specimen Description   Final    BLOOD BLOOD LEFT HAND Performed at [facility], [place] [address] [place], [place] [address]    Special Requests   Final    BOTTLES DRAWN AEROBIC AND ANAEROBIC Blood Culture adequate volume Performed at [place] [address], [place], [place] [address]    Culture   Final    NO GROWTH 4 DAYS Performed at [facility], [place] [address] [place], [place] [address]    Report Status PENDING  Incomplete  Culture, blood (Routine x 2)     Status: None (Preliminary result)   Collection Time: 01/07/19  9:30 PM   Specimen: BLOOD RIGHT HAND  Result Value Ref Range Status   Specimen Description   Final    BLOOD RIGHT HAND Performed at [facility], [place] [address], [place], [place] [address]    Special Requests   Final    BOTTLES DRAWN AEROBIC AND ANAEROBIC Blood Culture results may not be optimal due to an excessive volume of blood received in culture bottles Performed at [place] [address] [place], [place] [address]    Culture   Final    NO GROWTH 4 DAYS Performed at [facility], [place] [address] [place], [place] [address]    Report Status PENDING  Incomplete  SARS Coronavirus 2 [facility] order, Performed in [facility] hospital lab) Nasopharyngeal Nasopharyngeal Swab     Status: None   Collection Time: 01/07/19  9:54 PM   Specimen: Nasopharyngeal Swab  Result Value Ref Range Status   SARS Coronavirus 2 NEGATIVE NEGATIVE Final    Comment: (NOTE) If result is NEGATIVE SARS-CoV-2 target nucleic acids are NOT DETECTED. The SARS-CoV-2 RNA is generally detectable in upper and lower  respiratory specimens during the acute phase of infection. The lowest  concentration of SARS-CoV-2 viral copies this assay can detect is 250  copies / mL. A negative  result does not preclude SARS-CoV-2 infection  and should not be used as the sole basis for treatment or other  patient management decisions.  A negative result may occur with  improper specimen collection / handling, submission of specimen  other  than nasopharyngeal swab, presence of viral mutation(s) within the  areas targeted by this assay, and inadequate number of viral copies  (<250 copies / mL). A negative result must be combined with clinical  observations, patient history, and epidemiological information. If result is POSITIVE SARS-CoV-2 target nucleic acids are DETECTED. The SARS-CoV-2 RNA is generally detectable in upper and lower  respiratory specimens dur ing the acute phase of infection.  Positive  results are indicative of active infection with SARS-CoV-2.  Clinical  correlation with patient history and other diagnostic information is  necessary to determine patient infection status.  Positive results do  not rule out bacterial infection or co-infection with other viruses. If result is PRESUMPTIVE POSTIVE SARS-CoV-2 nucleic acids MAY BE PRESENT.   A presumptive positive result was obtained on the submitted specimen  and confirmed on repeat testing.  While 2019 novel coronavirus  (SARS-CoV-2) nucleic acids may be present in the submitted sample  additional confirmatory testing may be necessary for epidemiological  and / or clinical management purposes  to differentiate between  SARS-CoV-2 and other Sarbecovirus currently known to infect humans.  If clinically indicated additional testing with an alternate test  methodology [phone]) is advised. The SARS-CoV-2 RNA is generally  detectable in upper and lower respiratory sp ecimens during the acute  phase of infection. The expected result is Negative. Fact Sheet for Patients:  StrictlyIdeas.no Fact Sheet for Healthcare Providers: BankingDealers.co.za This test is not yet  approved or cleared by the [place] FDA and has been authorized for detection and/or diagnosis of SARS-CoV-2 by FDA under an Emergency Use Authorization (EUA).  This EUA will remain in effect (meaning this test can be used) for the duration of the COVID-19 declaration under Section 564(b)(1) of the Act, 21 U.S.C. section 360bbb-3(b)(1), unless the authorization is terminated or revoked sooner. Performed at [facility], [place] [address] [place], [place] [address]   Urine culture     Status: Abnormal   Collection Time: 01/08/19  3:17 AM   Specimen: Urine, Clean Catch  Result Value Ref Range Status   Specimen Description   Final    URINE, CLEAN CATCH Performed at [facility], [place] [address], [place], [place] [address]    Special Requests   Final    NONE Performed at [facility], [place] [address], [place], [place] [address]    Culture (A)  Final    >=100,000 COLONIES/mL KLEBSIELLA PNEUMONIAE >=100,000 COLONIES/mL AEROCOCCUS VIRIDANS    Report Status 01/10/2019 FINAL  Final   Organism ID, Bacteria KLEBSIELLA PNEUMONIAE (A)  Final      Susceptibility   Klebsiella pneumoniae - MIC*    AMPICILLIN RESISTANT Resistant     CEFAZOLIN <=4 SENSITIVE Sensitive     CEFTRIAXONE <=1 SENSITIVE Sensitive     CIPROFLOXACIN <=0.25 SENSITIVE Sensitive     GENTAMICIN <=1 SENSITIVE Sensitive     IMIPENEM <=0.25 SENSITIVE Sensitive     NITROFURANTOIN <=16 SENSITIVE Sensitive     TRIMETH/SULFA <=20 SENSITIVE Sensitive     AMPICILLIN/SULBACTAM 4 SENSITIVE Sensitive     PIP/TAZO <=4 SENSITIVE Sensitive     Extended ESBL NEGATIVE Sensitive     * >=100,000 COLONIES/mL KLEBSIELLA PNEUMONIAE  MRSA PCR Screening     Status: Abnormal   Collection Time: 01/08/19  4:01 PM   Specimen: Nasal Mucosa; Nasopharyngeal  Result Value Ref Range Status   MRSA by PCR (A) NEGATIVE Final    INVALID, UNABLE TO DETERMINE THE  PRESENCE OF TARGET DUE TO SPECIMEN  INTEGRITY. RECOLLECTION REQUESTED.    Comment: CRITICAL RESULT CALLED TO, READ BACK BY AND VERIFIED WITH: Roxan Diesel [id number] @ [place] Performed at [facility], [place] [address], [place], [name] [address]   MRSA PCR Screening     Status: None   Collection Time: 01/08/19  8:13 PM   Specimen: Nasal Mucosa; Nasopharyngeal  Result Value Ref Range Status   MRSA by PCR NEGATIVE NEGATIVE Final    Comment:        The GeneXpert MRSA Assay (FDA approved for NASAL specimens only), is one component of a comprehensive MRSA colonization surveillance program. It is not intended to diagnose MRSA infection nor to guide or monitor treatment for MRSA infections. Performed at [facility], [place] [address], [place], [place] [address]      Labs: Basic Metabolic Panel: Recent Labs  Lab 01/07/19 2129 01/08/19 0246 01/09/19 0539 01/10/19 0504 01/11/19 0348  NA 139 139 135 139 139  K 4.5 4.3 4.1 4.3 3.9  CL 114* 110 107 112* 114*  CO2 16* 17* 19* 20* 19*  GLUCOSE 132* 113* 106* 105* 108*  BUN 58* 51* 45* 37* 31*  CREATININE 2.70* 2.30* 2.08* 1.69* 1.69*  CALCIUM 8.4* 8.3* 7.7* 8.3* 8.1*  MG  --   --   --  1.6* 2.5*   Liver Function Tests: Recent Labs  Lab 01/07/19 2129 01/08/19 0246  AST 16 16  ALT 12 12  ALKPHOS 59 56  BILITOT 0.5 0.6  PROT 7.0 6.4*  ALBUMIN 3.0* 2.7*   No results for input(s): LIPASE, AMYLASE in the last 168 hours. No results for input(s): AMMONIA in the last 168 hours. CBC: Recent Labs  Lab 01/07/19 2129 01/08/19 0246 01/09/19 0539 01/10/19 0504 01/11/19 0348  WBC 13.4* 13.1* 11.8* 10.2 8.8  NEUTROABS 10.5* 10.8* 9.1* 7.5 6.0  HGB 9.4* 9.0* 8.4* 8.5* 8.3*  HCT 29.1* 28.5* 27.0* 27.1* 26.3*  MCV 91.2 91.1 92.5 92.5 91.6  PLT 183 171 159 159 166   Cardiac Enzymes: No results for input(s): CKTOTAL, CKMB, CKMBINDEX, TROPONINI in the last 168 hours. BNP: BNP (last 3 results) No results for input(s): BNP  in the last 8760 hours.  ProBNP (last 3 results) No results for input(s): PROBNP in the last 8760 hours.  CBG: No results for input(s): GLUCAP in the last 168 hours.     Signed:  Irine Seal MD.  [facility] 01/11/2019, 12:00 PM

## 2019-01-11 NOTE — Evaluation (Signed)
Physical Therapy Evaluation Patient Details Name: Manuel King MRN: [id number] DOB: 1929-08-29 Today's Date: 01/11/2019   History of Present Illness  Pt is an 85 year old male admitted for sepsis likely secondary to UTI and probable pneumonia with PMHx significant for dementia, HTN, prostate cancer and CVA  Clinical Impression  Pt admitted with above diagnosis.  Pt currently with functional limitations due to the deficits listed below (see PT Problem List). Pt will benefit from skilled PT to increase their independence and safety with mobility to allow discharge to the venue listed below.  Pt from [facility] and reports he is ambulatory with walker however requires assist for ADLs at baseline.  Pt requiring assist for bed mobility today.  Pt returned to supine on his own prior to being able to assist to standing however likely will require at least min assist with standing.  Pt would benefit from SNF if ALF cannot provide current assist level.     Follow Up Recommendations SNF;Supervision/Assistance - 24 hour    Equipment Recommendations  None recommended by PT    Recommendations for Other Services       Precautions / Restrictions Precautions Precautions: Fall Precaution Comments: residual deficits left extremities      Mobility  Bed Mobility Overal bed mobility: Needs Assistance Bed Mobility: Supine to Sit;Sit to Supine     Supine to sit: Mod assist;HOB elevated Sit to supine: Mod assist   General bed mobility comments: verbal cues for technique, pt attempting to assist however more assist required for left side, pt to EOB and then started to return to supine on his own (BM observed on bed pad, nursing notified); pt perseverating on calling someone  Transfers                 General transfer comment: pt assisted to sitting and then started returning to supine despite cues, likely requires at least min assist  Ambulation/Gait                Stairs             Wheelchair Mobility    Modified Rankin (Stroke Patients Only)       Balance                                             Pertinent Vitals/Pain Pain Assessment: No/denies pain    Home Living Family/patient expects to be discharged to:: Assisted living               Home Equipment: Walker - 2 wheels      Prior Function Level of Independence: Needs assistance   Gait / Transfers Assistance Needed: pt reports ambulating with walker  ADL's / Homemaking Assistance Needed: reports staff assisted with ADLs        Hand Dominance        Extremity/Trunk Assessment        Lower Extremity Assessment Lower Extremity Assessment: Generalized weakness;LLE deficits/detail LLE Deficits / Details: residual deficits since CVA, at least 2+/5 throughout       Communication   Communication: HOH  Cognition Arousal/Alertness: Awake/alert Behavior During Therapy: Flat affect Overall Cognitive Status: History of cognitive impairments - at baseline  General Comments      Exercises     Assessment/Plan    PT Assessment Patient needs continued PT services  PT Problem List Decreased strength;Decreased mobility;Decreased activity tolerance;Decreased balance;Decreased knowledge of use of DME;Decreased safety awareness       PT Treatment Interventions DME instruction;Therapeutic exercise;Gait training;Balance training;Functional mobility training;Therapeutic activities;Patient/family education    PT Goals (Current goals can be found in the Care Plan section)  Acute Rehab PT Goals PT Goal Formulation: With patient Time For Goal Achievement: 01/25/19 Potential to Achieve Goals: Good    Frequency Min 2X/week   Barriers to discharge        Co-evaluation               AM-PAC PT "6 Clicks" Mobility  Outcome Measure Help needed turning from your back to your side while in a flat bed  without using bedrails?: A Lot Help needed moving from lying on your back to sitting on the side of a flat bed without using bedrails?: A Lot Help needed moving to and from a bed to a chair (including a wheelchair)?: A Lot Help needed standing up from a chair using your arms (e.g., wheelchair or bedside chair)?: Total Help needed to walk in hospital room?: Total Help needed climbing 3-5 steps with a railing? : Total 6 Click Score: 9    End of Session   Activity Tolerance: Patient tolerated treatment well Patient left: with call bell/phone within reach;in bed;with bed alarm set Nurse Communication: Mobility status PT Visit Diagnosis: Other abnormalities of gait and mobility (R26.89)    Time: 0946-1000 PT Time Calculation (min) (ACUTE ONLY): 14 min   Charges:   PT Evaluation $PT Eval Low Complexity: [place], PT, DPT Acute Rehabilitation Services Office: [phone] Pager: [phone]  Trena Platt 01/11/2019, 12:46 PM

## 2019-01-12 LAB — CULTURE, BLOOD (ROUTINE X 2)
Culture: NO GROWTH
Culture: NO GROWTH
Special Requests: ADEQUATE

## 2019-01-13 DIAGNOSIS — Z03818 Encounter for observation for suspected exposure to other biological agents ruled out: Secondary | ICD-10-CM | POA: Diagnosis not present

## 2019-01-17 DIAGNOSIS — M109 Gout, unspecified: Secondary | ICD-10-CM | POA: Diagnosis not present

## 2019-01-17 DIAGNOSIS — N3281 Overactive bladder: Secondary | ICD-10-CM | POA: Diagnosis not present

## 2019-01-17 DIAGNOSIS — D509 Iron deficiency anemia, unspecified: Secondary | ICD-10-CM | POA: Diagnosis not present

## 2019-01-25 DIAGNOSIS — N3281 Overactive bladder: Secondary | ICD-10-CM | POA: Diagnosis not present

## 2019-01-25 DIAGNOSIS — R338 Other retention of urine: Secondary | ICD-10-CM | POA: Diagnosis not present

## 2019-01-31 DIAGNOSIS — R2689 Other abnormalities of gait and mobility: Secondary | ICD-10-CM | POA: Diagnosis not present

## 2019-02-01 DIAGNOSIS — N3281 Overactive bladder: Secondary | ICD-10-CM | POA: Diagnosis not present

## 2019-02-01 DIAGNOSIS — R338 Other retention of urine: Secondary | ICD-10-CM | POA: Diagnosis not present

## 2019-02-07 DIAGNOSIS — G894 Chronic pain syndrome: Secondary | ICD-10-CM | POA: Diagnosis not present

## 2019-02-10 DIAGNOSIS — Z03818 Encounter for observation for suspected exposure to other biological agents ruled out: Secondary | ICD-10-CM | POA: Diagnosis not present

## 2019-02-14 DIAGNOSIS — H04123 Dry eye syndrome of bilateral lacrimal glands: Secondary | ICD-10-CM | POA: Diagnosis not present

## 2019-02-14 DIAGNOSIS — I10 Essential (primary) hypertension: Secondary | ICD-10-CM | POA: Diagnosis not present

## 2019-02-14 DIAGNOSIS — E782 Mixed hyperlipidemia: Secondary | ICD-10-CM | POA: Diagnosis not present

## 2019-02-19 DIAGNOSIS — Z79899 Other long term (current) drug therapy: Secondary | ICD-10-CM | POA: Diagnosis not present

## 2019-02-24 DIAGNOSIS — Z03818 Encounter for observation for suspected exposure to other biological agents ruled out: Secondary | ICD-10-CM | POA: Diagnosis not present

## 2019-03-02 DIAGNOSIS — I10 Essential (primary) hypertension: Secondary | ICD-10-CM | POA: Diagnosis not present

## 2019-03-02 DIAGNOSIS — E782 Mixed hyperlipidemia: Secondary | ICD-10-CM | POA: Diagnosis not present

## 2019-03-07 DIAGNOSIS — M109 Gout, unspecified: Secondary | ICD-10-CM | POA: Diagnosis not present

## 2019-03-07 DIAGNOSIS — I10 Essential (primary) hypertension: Secondary | ICD-10-CM | POA: Diagnosis not present

## 2019-03-07 DIAGNOSIS — D509 Iron deficiency anemia, unspecified: Secondary | ICD-10-CM | POA: Diagnosis not present

## 2019-03-10 DIAGNOSIS — Z03818 Encounter for observation for suspected exposure to other biological agents ruled out: Secondary | ICD-10-CM | POA: Diagnosis not present

## 2019-03-14 DIAGNOSIS — M1909 Primary osteoarthritis, other specified site: Secondary | ICD-10-CM | POA: Diagnosis not present

## 2019-03-17 ENCOUNTER — Other Ambulatory Visit: Payer: Self-pay

## 2019-03-17 NOTE — Patient Outreach (Signed)
[facility]) Care Management  03/17/2019  [name] [birth year] [id number]   Medication Adherence call to Mr. Manuel King HIPPA Compliant Voice message left with a call back number. Mr. Manuel King is showing past due on Irbesartan 150 mg under [place].   [facility] Management Direct Dial [phone]  Fax [phone] [email or web address]

## 2019-03-28 DIAGNOSIS — B351 Tinea unguium: Secondary | ICD-10-CM | POA: Diagnosis not present

## 2019-03-28 DIAGNOSIS — Q845 Enlarged and hypertrophic nails: Secondary | ICD-10-CM | POA: Diagnosis not present

## 2019-03-28 DIAGNOSIS — I739 Peripheral vascular disease, unspecified: Secondary | ICD-10-CM | POA: Diagnosis not present

## 2019-04-11 DIAGNOSIS — M1990 Unspecified osteoarthritis, unspecified site: Secondary | ICD-10-CM | POA: Diagnosis not present

## 2019-04-11 DIAGNOSIS — K5900 Constipation, unspecified: Secondary | ICD-10-CM | POA: Diagnosis not present

## 2019-04-11 DIAGNOSIS — R269 Unspecified abnormalities of gait and mobility: Secondary | ICD-10-CM | POA: Diagnosis not present

## 2019-04-18 DIAGNOSIS — M1991 Primary osteoarthritis, unspecified site: Secondary | ICD-10-CM | POA: Diagnosis not present

## 2019-05-09 DIAGNOSIS — N3281 Overactive bladder: Secondary | ICD-10-CM | POA: Diagnosis not present

## 2019-05-09 DIAGNOSIS — M109 Gout, unspecified: Secondary | ICD-10-CM | POA: Diagnosis not present

## 2019-05-09 DIAGNOSIS — U071 COVID-19: Secondary | ICD-10-CM | POA: Diagnosis not present

## 2019-05-11 ENCOUNTER — Other Ambulatory Visit: Payer: Self-pay

## 2019-05-11 ENCOUNTER — Emergency Department (HOSPITAL_COMMUNITY): Payer: Medicare Other

## 2019-05-11 ENCOUNTER — Inpatient Hospital Stay (HOSPITAL_COMMUNITY)
Admission: EM | Admit: 2019-05-11 | Discharge: 2019-05-17 | DRG: 690 | Disposition: A | Discharge status: Skilled Nursing Facility | Payer: Medicare Other | Source: Skilled Nursing Facility | Attending: Internal Medicine | Admitting: Internal Medicine

## 2019-05-11 ENCOUNTER — Encounter (HOSPITAL_COMMUNITY): Payer: Self-pay

## 2019-05-11 DIAGNOSIS — R52 Pain, unspecified: Secondary | ICD-10-CM | POA: Diagnosis not present

## 2019-05-11 DIAGNOSIS — M25561 Pain in right knee: Secondary | ICD-10-CM | POA: Diagnosis not present

## 2019-05-11 DIAGNOSIS — R2689 Other abnormalities of gait and mobility: Secondary | ICD-10-CM | POA: Diagnosis not present

## 2019-05-11 DIAGNOSIS — I469 Cardiac arrest, cause unspecified: Secondary | ICD-10-CM | POA: Diagnosis not present

## 2019-05-11 DIAGNOSIS — R410 Disorientation, unspecified: Secondary | ICD-10-CM | POA: Diagnosis not present

## 2019-05-11 DIAGNOSIS — R042 Hemoptysis: Secondary | ICD-10-CM | POA: Diagnosis not present

## 2019-05-11 DIAGNOSIS — R05 Cough: Secondary | ICD-10-CM

## 2019-05-11 DIAGNOSIS — M1990 Unspecified osteoarthritis, unspecified site: Secondary | ICD-10-CM | POA: Diagnosis not present

## 2019-05-11 DIAGNOSIS — Z743 Need for continuous supervision: Secondary | ICD-10-CM | POA: Diagnosis not present

## 2019-05-11 DIAGNOSIS — Z20822 Contact with and (suspected) exposure to covid-19: Secondary | ICD-10-CM | POA: Diagnosis not present

## 2019-05-11 DIAGNOSIS — Z981 Arthrodesis status: Secondary | ICD-10-CM

## 2019-05-11 DIAGNOSIS — Z87891 Personal history of nicotine dependence: Secondary | ICD-10-CM | POA: Diagnosis not present

## 2019-05-11 DIAGNOSIS — L89621 Pressure ulcer of left heel, stage 1: Secondary | ICD-10-CM | POA: Diagnosis present

## 2019-05-11 DIAGNOSIS — Z833 Family history of diabetes mellitus: Secondary | ICD-10-CM

## 2019-05-11 DIAGNOSIS — Z8546 Personal history of malignant neoplasm of prostate: Secondary | ICD-10-CM | POA: Diagnosis not present

## 2019-05-11 DIAGNOSIS — R112 Nausea with vomiting, unspecified: Secondary | ICD-10-CM | POA: Diagnosis present

## 2019-05-11 DIAGNOSIS — R279 Unspecified lack of coordination: Secondary | ICD-10-CM | POA: Diagnosis not present

## 2019-05-11 DIAGNOSIS — R059 Cough, unspecified: Secondary | ICD-10-CM

## 2019-05-11 DIAGNOSIS — R000 Tachycardia, unspecified: Secondary | ICD-10-CM | POA: Diagnosis not present

## 2019-05-11 DIAGNOSIS — D631 Anemia in chronic kidney disease: Secondary | ICD-10-CM | POA: Diagnosis not present

## 2019-05-11 DIAGNOSIS — R404 Transient alteration of awareness: Secondary | ICD-10-CM | POA: Diagnosis not present

## 2019-05-11 DIAGNOSIS — N390 Urinary tract infection, site not specified: Secondary | ICD-10-CM | POA: Diagnosis not present

## 2019-05-11 DIAGNOSIS — I491 Atrial premature depolarization: Secondary | ICD-10-CM | POA: Diagnosis not present

## 2019-05-11 DIAGNOSIS — F0150 Vascular dementia without behavioral disturbance: Secondary | ICD-10-CM | POA: Diagnosis present

## 2019-05-11 DIAGNOSIS — E785 Hyperlipidemia, unspecified: Secondary | ICD-10-CM | POA: Diagnosis present

## 2019-05-11 DIAGNOSIS — I471 Supraventricular tachycardia: Secondary | ICD-10-CM | POA: Diagnosis not present

## 2019-05-11 DIAGNOSIS — Z8249 Family history of ischemic heart disease and other diseases of the circulatory system: Secondary | ICD-10-CM | POA: Diagnosis not present

## 2019-05-11 DIAGNOSIS — E860 Dehydration: Secondary | ICD-10-CM | POA: Diagnosis not present

## 2019-05-11 DIAGNOSIS — R531 Weakness: Secondary | ICD-10-CM | POA: Diagnosis not present

## 2019-05-11 DIAGNOSIS — N3000 Acute cystitis without hematuria: Secondary | ICD-10-CM | POA: Diagnosis not present

## 2019-05-11 DIAGNOSIS — A419 Sepsis, unspecified organism: Secondary | ICD-10-CM | POA: Diagnosis not present

## 2019-05-11 DIAGNOSIS — E876 Hypokalemia: Secondary | ICD-10-CM | POA: Diagnosis not present

## 2019-05-11 DIAGNOSIS — Z8673 Personal history of transient ischemic attack (TIA), and cerebral infarction without residual deficits: Secondary | ICD-10-CM

## 2019-05-11 DIAGNOSIS — I129 Hypertensive chronic kidney disease with stage 1 through stage 4 chronic kidney disease, or unspecified chronic kidney disease: Secondary | ICD-10-CM | POA: Diagnosis not present

## 2019-05-11 DIAGNOSIS — Z79891 Long term (current) use of opiate analgesic: Secondary | ICD-10-CM | POA: Diagnosis not present

## 2019-05-11 DIAGNOSIS — Z79899 Other long term (current) drug therapy: Secondary | ICD-10-CM

## 2019-05-11 DIAGNOSIS — N179 Acute kidney failure, unspecified: Secondary | ICD-10-CM | POA: Diagnosis present

## 2019-05-11 DIAGNOSIS — R2681 Unsteadiness on feet: Secondary | ICD-10-CM | POA: Diagnosis not present

## 2019-05-11 DIAGNOSIS — A498 Other bacterial infections of unspecified site: Secondary | ICD-10-CM | POA: Diagnosis not present

## 2019-05-11 DIAGNOSIS — L89152 Pressure ulcer of sacral region, stage 2: Secondary | ICD-10-CM | POA: Diagnosis present

## 2019-05-11 DIAGNOSIS — B9561 Methicillin susceptible Staphylococcus aureus infection as the cause of diseases classified elsewhere: Secondary | ICD-10-CM | POA: Diagnosis not present

## 2019-05-11 DIAGNOSIS — R7401 Elevation of levels of liver transaminase levels: Secondary | ICD-10-CM | POA: Diagnosis present

## 2019-05-11 DIAGNOSIS — R6510 Systemic inflammatory response syndrome (SIRS) of non-infectious origin without acute organ dysfunction: Secondary | ICD-10-CM | POA: Diagnosis present

## 2019-05-11 DIAGNOSIS — L8990 Pressure ulcer of unspecified site, unspecified stage: Secondary | ICD-10-CM | POA: Insufficient documentation

## 2019-05-11 DIAGNOSIS — N183 Chronic kidney disease, stage 3 unspecified: Secondary | ICD-10-CM | POA: Diagnosis present

## 2019-05-11 DIAGNOSIS — F329 Major depressive disorder, single episode, unspecified: Secondary | ICD-10-CM | POA: Diagnosis present

## 2019-05-11 DIAGNOSIS — I10 Essential (primary) hypertension: Secondary | ICD-10-CM | POA: Diagnosis not present

## 2019-05-11 DIAGNOSIS — M6281 Muscle weakness (generalized): Secondary | ICD-10-CM | POA: Diagnosis not present

## 2019-05-11 DIAGNOSIS — U071 COVID-19: Secondary | ICD-10-CM | POA: Diagnosis not present

## 2019-05-11 DIAGNOSIS — R1110 Vomiting, unspecified: Secondary | ICD-10-CM | POA: Diagnosis not present

## 2019-05-11 DIAGNOSIS — E875 Hyperkalemia: Secondary | ICD-10-CM | POA: Diagnosis present

## 2019-05-11 LAB — CBC WITH DIFFERENTIAL/PLATELET
Abs Immature Granulocytes: 0.25 10*3/uL — ABNORMAL HIGH (ref 0.00–0.07)
Abs Immature Granulocytes: 0.21 10*3/uL — ABNORMAL HIGH (ref 0.00–0.07)
Basophils Absolute: 0 10*3/uL (ref 0.0–0.1)
Basophils Absolute: 0 10*3/uL (ref 0.0–0.1)
Basophils Relative: 0 %
Basophils Relative: 0 %
Eosinophils Absolute: 0 10*3/uL (ref 0.0–0.5)
Eosinophils Absolute: 0 10*3/uL (ref 0.0–0.5)
Eosinophils Relative: 0 %
Eosinophils Relative: 0 %
HCT: 29.4 % — ABNORMAL LOW (ref 39.0–52.0)
HCT: 27.9 % — ABNORMAL LOW (ref 39.0–52.0)
Hemoglobin: 9.3 g/dL — ABNORMAL LOW (ref 13.0–17.0)
Hemoglobin: 8.7 g/dL — ABNORMAL LOW (ref 13.0–17.0)
Immature Granulocytes: 2 %
Immature Granulocytes: 1 %
Lymphocytes Relative: 8 %
Lymphocytes Relative: 8 %
Lymphs Abs: 1.4 10*3/uL (ref 0.7–4.0)
Lymphs Abs: 1.4 10*3/uL (ref 0.7–4.0)
MCH: 25.8 pg — ABNORMAL LOW (ref 26.0–34.0)
MCH: 25.6 pg — ABNORMAL LOW (ref 26.0–34.0)
MCHC: 31.6 g/dL (ref 30.0–36.0)
MCHC: 31.2 g/dL (ref 30.0–36.0)
MCV: 81.7 fL (ref 80.0–100.0)
MCV: 82.1 fL (ref 80.0–100.0)
Monocytes Absolute: 1.1 10*3/uL — ABNORMAL HIGH (ref 0.1–1.0)
Monocytes Absolute: 1 10*3/uL (ref 0.1–1.0)
Monocytes Relative: 7 %
Monocytes Relative: 6 %
Neutro Abs: 13.7 10*3/uL — ABNORMAL HIGH (ref 1.7–7.7)
Neutro Abs: 15.2 10*3/uL — ABNORMAL HIGH (ref 1.7–7.7)
Neutrophils Relative %: 83 %
Neutrophils Relative %: 85 %
Platelets: 333 10*3/uL (ref 150–400)
Platelets: 292 10*3/uL (ref 150–400)
RBC: 3.6 MIL/uL — ABNORMAL LOW (ref 4.22–5.81)
RBC: 3.4 MIL/uL — ABNORMAL LOW (ref 4.22–5.81)
RDW: 17.2 % — ABNORMAL HIGH (ref 11.5–15.5)
RDW: 17.2 % — ABNORMAL HIGH (ref 11.5–15.5)
WBC: 16.6 10*3/uL — ABNORMAL HIGH (ref 4.0–10.5)
WBC: 17.8 10*3/uL — ABNORMAL HIGH (ref 4.0–10.5)
nRBC: 0 % (ref 0.0–0.2)
nRBC: 0 % (ref 0.0–0.2)

## 2019-05-11 LAB — LACTIC ACID, PLASMA: Lactic Acid, Venous: 1.4 mmol/L (ref 0.5–1.9)

## 2019-05-11 LAB — COMPREHENSIVE METABOLIC PANEL
ALT: 34 U/L (ref 0–44)
AST: 93 U/L — ABNORMAL HIGH (ref 15–41)
Albumin: 2 g/dL — ABNORMAL LOW (ref 3.5–5.0)
Alkaline Phosphatase: 84 U/L (ref 38–126)
Anion gap: 17 — ABNORMAL HIGH (ref 5–15)
BUN: 84 mg/dL — ABNORMAL HIGH (ref 8–23)
CO2: 20 mmol/L — ABNORMAL LOW (ref 22–32)
Calcium: 8.7 mg/dL — ABNORMAL LOW (ref 8.9–10.3)
Chloride: 97 mmol/L — ABNORMAL LOW (ref 98–111)
Creatinine, Ser: 2.69 mg/dL — ABNORMAL HIGH (ref 0.61–1.24)
GFR calc Af Amer: 24 mL/min — ABNORMAL LOW (ref >60)
GFR calc non Af Amer: 20 mL/min — ABNORMAL LOW (ref >60)
Glucose, Bld: 124 mg/dL — ABNORMAL HIGH (ref 70–99)
Potassium: 2.7 mmol/L — CL (ref 3.5–5.1)
Sodium: 134 mmol/L — ABNORMAL LOW (ref 135–145)
Total Bilirubin: 1.5 mg/dL — ABNORMAL HIGH (ref 0.3–1.2)
Total Protein: 8 g/dL (ref 6.5–8.1)

## 2019-05-11 LAB — URINALYSIS, ROUTINE W REFLEX MICROSCOPIC
Bilirubin Urine: NEGATIVE
Glucose, UA: NEGATIVE mg/dL
Ketones, ur: NEGATIVE mg/dL
Nitrite: NEGATIVE
Protein, ur: 100 mg/dL — AB
Specific Gravity, Urine: 1.013 (ref 1.005–1.030)
WBC, UA: >50 WBC/hpf — ABNORMAL HIGH (ref 0–5)
pH: 5 (ref 5.0–8.0)

## 2019-05-11 LAB — PROCALCITONIN: Procalcitonin: 1.89 ng/mL

## 2019-05-11 LAB — URIC ACID: Uric Acid, Serum: 10.4 mg/dL — ABNORMAL HIGH (ref 3.7–8.6)

## 2019-05-11 LAB — RESPIRATORY PANEL BY RT PCR (FLU A&B, COVID)
Influenza A by PCR: NEGATIVE
Influenza B by PCR: NEGATIVE
SARS Coronavirus 2 by RT PCR: NEGATIVE

## 2019-05-11 LAB — VALPROIC ACID LEVEL: Valproic Acid Lvl: <10 ug/mL — ABNORMAL LOW (ref 50.0–100.0)

## 2019-05-11 LAB — LIPASE, BLOOD: Lipase: 32 U/L (ref 11–51)

## 2019-05-11 MED ORDER — DOCUSATE SODIUM 100 MG PO CAPS
100.0000 mg | ORAL_CAPSULE | Freq: Every day | ORAL | Status: DC
  Administered 2019-05-12 – 2019-05-14 (×3): 100 mg via ORAL
  Filled 2019-05-11 (×4): qty 1

## 2019-05-11 MED ORDER — POTASSIUM CHLORIDE 10 MEQ/100ML IV SOLN
10.0000 meq | INTRAVENOUS | Status: AC
  Administered 2019-05-11 (×2): 10 meq via INTRAVENOUS
  Filled 2019-05-11 (×2): qty 100

## 2019-05-11 MED ORDER — HYDRALAZINE HCL 20 MG/ML IJ SOLN: 10.0000 mg | INTRAMUSCULAR | Status: DC | PRN

## 2019-05-11 MED ORDER — HEPARIN SODIUM (PORCINE) 5000 UNIT/ML IJ SOLN
5000.0000 [IU] | Freq: Three times a day (TID) | INTRAMUSCULAR | Status: DC
  Administered 2019-05-11 – 2019-05-17 (×20): 5000 [IU] via SUBCUTANEOUS
  Filled 2019-05-11 (×19): qty 1

## 2019-05-11 MED ORDER — ONDANSETRON HCL 4 MG/2ML IJ SOLN: 4.0000 mg | Freq: Four times a day (QID) | INTRAMUSCULAR | Status: DC | PRN

## 2019-05-11 MED ORDER — ACETAMINOPHEN 325 MG PO TABS
650.0000 mg | ORAL_TABLET | Freq: Four times a day (QID) | ORAL | Status: DC | PRN
  Administered 2019-05-12 – 2019-05-16 (×6): 650 mg via ORAL
  Filled 2019-05-11 (×6): qty 2

## 2019-05-11 MED ORDER — PRAVASTATIN SODIUM 40 MG PO TABS
40.0000 mg | ORAL_TABLET | Freq: Every day | ORAL | Status: DC
  Administered 2019-05-11 – 2019-05-16 (×6): 40 mg via ORAL
  Filled 2019-05-11 (×6): qty 1

## 2019-05-11 MED ORDER — DIVALPROEX SODIUM 250 MG PO DR TAB
250.0000 mg | DELAYED_RELEASE_TABLET | Freq: Every day | ORAL | Status: DC
  Administered 2019-05-11 – 2019-05-17 (×7): 250 mg via ORAL
  Filled 2019-05-11 (×7): qty 1

## 2019-05-11 MED ORDER — SODIUM CHLORIDE 0.9 % IV SOLN: INTRAVENOUS | Status: AC

## 2019-05-11 MED ORDER — SODIUM CHLORIDE 0.9 % IV SOLN
2.0000 g | INTRAVENOUS | Status: DC
  Administered 2019-05-11 – 2019-05-12 (×2): 2 g via INTRAVENOUS
  Filled 2019-05-11 (×2): qty 2

## 2019-05-11 MED ORDER — POLYVINYL ALCOHOL 1.4 % OP SOLN
2.0000 [drp] | Freq: Two times a day (BID) | OPHTHALMIC | Status: DC
  Administered 2019-05-11 – 2019-05-17 (×11): 2 [drp] via OPHTHALMIC
  Filled 2019-05-11 (×2): qty 15

## 2019-05-11 MED ORDER — DONEPEZIL HCL 10 MG PO TABS
10.0000 mg | ORAL_TABLET | Freq: Every day | ORAL | Status: DC
  Administered 2019-05-11 – 2019-05-16 (×6): 10 mg via ORAL
  Filled 2019-05-11 (×6): qty 1

## 2019-05-11 MED ORDER — DIVALPROEX SODIUM 250 MG PO DR TAB
500.0000 mg | DELAYED_RELEASE_TABLET | Freq: Every day | ORAL | Status: DC
  Administered 2019-05-11 – 2019-05-16 (×6): 500 mg via ORAL
  Filled 2019-05-11 (×6): qty 2

## 2019-05-11 MED ORDER — ONDANSETRON HCL 4 MG PO TABS: 4.0000 mg | ORAL_TABLET | Freq: Four times a day (QID) | ORAL | Status: DC | PRN

## 2019-05-11 MED ORDER — SODIUM CHLORIDE 0.9 % IV BOLUS (SEPSIS)
1000.0000 mL | Freq: Once | INTRAVENOUS | Status: AC
  Administered 2019-05-11: 05:00:00 1000 mL via INTRAVENOUS

## 2019-05-11 MED ORDER — PAROXETINE HCL 20 MG PO TABS
20.0000 mg | ORAL_TABLET | Freq: Every day | ORAL | Status: DC
  Administered 2019-05-11 – 2019-05-17 (×7): 20 mg via ORAL
  Filled 2019-05-11 (×7): qty 1

## 2019-05-11 MED ORDER — HYDRALAZINE HCL 25 MG PO TABS
25.0000 mg | ORAL_TABLET | Freq: Three times a day (TID) | ORAL | Status: DC
  Administered 2019-05-11 – 2019-05-17 (×20): 25 mg via ORAL
  Filled 2019-05-11 (×20): qty 1

## 2019-05-11 MED ORDER — OXYBUTYNIN CHLORIDE 5 MG PO TABS
5.0000 mg | ORAL_TABLET | Freq: Three times a day (TID) | ORAL | Status: DC
  Administered 2019-05-11 – 2019-05-17 (×20): 5 mg via ORAL
  Filled 2019-05-11 (×20): qty 1

## 2019-05-11 MED ORDER — SODIUM CHLORIDE 0.9 % IV SOLN
1.0000 g | Freq: Once | INTRAVENOUS | Status: AC
  Administered 2019-05-11: 1 g via INTRAVENOUS
  Filled 2019-05-11: qty 10

## 2019-05-11 MED ORDER — DIVALPROEX SODIUM 250 MG PO DR TAB: 250.0000 mg | DELAYED_RELEASE_TABLET | ORAL | Status: DC

## 2019-05-11 MED ORDER — ACETAMINOPHEN 650 MG RE SUPP
650.0000 mg | Freq: Four times a day (QID) | RECTAL | Status: DC | PRN
  Filled 2019-05-11: qty 1

## 2019-05-11 MED ORDER — AMLODIPINE BESYLATE 5 MG PO TABS
5.0000 mg | ORAL_TABLET | Freq: Every day | ORAL | Status: DC
  Administered 2019-05-11 – 2019-05-17 (×7): 5 mg via ORAL
  Filled 2019-05-11 (×7): qty 1

## 2019-05-11 MED ORDER — POTASSIUM CHLORIDE 10 MEQ/100ML IV SOLN
10.0000 meq | Freq: Once | INTRAVENOUS | Status: AC
  Administered 2019-05-11: 05:00:00 10 meq via INTRAVENOUS
  Filled 2019-05-11: qty 100

## 2019-05-11 MED ORDER — FERROUS SULFATE 325 (65 FE) MG PO TABS
325.0000 mg | ORAL_TABLET | Freq: Two times a day (BID) | ORAL | Status: DC
  Administered 2019-05-11 – 2019-05-17 (×13): 325 mg via ORAL
  Filled 2019-05-11 (×14): qty 1

## 2019-05-11 MED ORDER — ALLOPURINOL 100 MG PO TABS
100.0000 mg | ORAL_TABLET | Freq: Every day | ORAL | Status: DC
  Administered 2019-05-11 – 2019-05-17 (×7): 100 mg via ORAL
  Filled 2019-05-11 (×8): qty 1

## 2019-05-11 NOTE — ED Triage Notes (Signed)
Patient coming from [facility] with complaints of nausea, vomiting, and fever that started yesterday morning. Patient was diagnosed with COVID on December 15th and has not had any symptoms until yesterday. Patient had one episode of emesis today. Highest fever with nursing facility was 101 orally.

## 2019-05-11 NOTE — ED Provider Notes (Signed)
[facility] DEPT Provider Note   CSN: [id number] Arrival date & time: 05/11/19  0120     History Chief Complaint  Patient presents with  . Nausea  . Emesis  . Fever  . COVID+   Level 5 caveat due to dementia Manuel King is a 86 y.o. male.  The history is provided by the patient.  Emesis Severity:  Moderate Timing:  Intermittent Progression:  Worsening Chronicity:  New Relieved by:  Nothing Worsened by:  Nothing Associated symptoms: fever   Fever Associated symptoms: vomiting    Patient with history of previous stroke, dementia presents with vomiting.  Patient presents with nursing facility.  It is reported patient was diagnosed with COVID-19 on December 15.  He has been asymptomatic until tonight when he began having fevers and vomiting. Patient is unable provide any further details    Past Medical History:  Diagnosis Date  . Arthritis   . Cellulitis currently   right foot  . CVA (cerebral vascular accident) ([place])   . Dementia ([place])   . Hypertension   . Prostate cancer [facility])     Patient Active Problem List   Diagnosis Date Noted  . Klebsiella pneumoniae infection   . Low vitamin B12 level 01/09/2019  . Acute lower UTI 01/09/2019  . HLD (hyperlipidemia) 08/18/2017  . Dementia ([place]) 08/18/2017  . Iron deficiency anemia 08/18/2017  . Acute renal failure superimposed on stage 3 chronic kidney disease ([place]) 08/18/2017  . Sepsis ([place]) 08/18/2017  . Urinary urgency 01/25/2015  . Ambulatory dysfunction 12/21/2014  . Generalized weakness 07/18/2014  . Fall 07/18/2014  . Malnutrition of moderate degree ([place]) 02/03/2014  . Bradycardia 02/01/2014  . Gout 09/16/2011  . Community acquired pneumonia 09/16/2011  . AKI (acute kidney injury) ([place]) 09/12/2011  . Falls 09/12/2011  . Acute parotitis 05/10/2011  . History of CVA (cerebrovascular accident) 05/10/2011  . Vascular dementia ([place]) 05/10/2011  . SLEEP APNEA 11/16/2008  .  BRADYCARDIA 09/29/2008  . FATIGUE, CHRONIC 09/29/2008  . Essential hypertension 09/28/2008  . PROSTATE CANCER, HX OF 09/28/2008    Past Surgical History:  Procedure Laterality Date  . ANTERIOR CERVICAL DECOMP/DISCECTOMY FUSION  05/29/1999   Anterior C3 and C4 diskectomy, decompression of the spinal cord,bone bank graft, Synthes plate and microscope/notes 09/17/2010  . CARPAL TUNNEL RELEASE Left 09/27/1999   Archie Endo 09/17/2010  . CARPAL TUNNEL RELEASE Right 08/12/2000   Archie Endo 09/17/2010  . FOOT SURGERY     Partial excision left medial cuneiform.; Partial excision left first metatarsal./notes 09/17/2010  . INSERTION PROSTATE RADIATION SEED  09/2005   Archie Endo 09/17/2010  . NECK SURGERY     "BOIL ON MY NECK"  . spider bite surgery     on left leg       Family History  Problem Relation Age of Onset  . Hypotension Mother   . Diabetes type II Mother   . CAD Mother   . Hypotension Father   . CAD Father   . CAD Sister     Social History   Tobacco Use  . Smoking status: Former Smoker    Years: 15.00  . Smokeless tobacco: Never Used  Substance Use Topics  . Alcohol use: No    Alcohol/week: 1.0 standard drinks    Types: 1 Glasses of wine per week    Comment: none in 2 years or more  . Drug use: No    Home Medications Prior to Admission medications   Medication Sig Start Date End  Date Taking? Authorizing Provider  acetaminophen (TYLENOL) 325 MG tablet Take 2 tablets (650 mg total) by mouth every 6 (six) hours as needed for mild pain (or Fever >/= 101). 01/11/19  Yes Eugenie Filler, MD  acetaminophen (TYLENOL) 500 MG tablet Take 500 mg by mouth 2 (two) times daily.   Yes [provider]  allopurinol (ZYLOPRIM) 100 MG tablet Take 100 mg by mouth daily.   Yes [provider]  amLODipine (NORVASC) 5 MG tablet Take 5 mg by mouth daily.   Yes [provider]  divalproex (DEPAKOTE) 250 MG DR tablet Take 250-500 mg by mouth See admin instructions. Take 250  mg  tablet in the am and 500 mg  tablets in the pm   Yes [provider]  docusate sodium (COLACE) 100 MG capsule Take 100 mg by mouth daily.   Yes [provider]  donepezil (ARICEPT) 10 MG tablet Take 10 mg by mouth at bedtime.   Yes [provider]  ferrous sulfate 325 (65 FE) MG EC tablet Take 325 mg by mouth 2 (two) times daily.   Yes [provider]  hydrALAZINE (APRESOLINE) 25 MG tablet Take 1 tablet (25 mg total) by mouth every 8 (eight) hours. 12/25/14  Yes Theodis Blaze, MD  hydrochlorothiazide (HYDRODIURIL) 12.5 MG tablet Take 12.5 mg by mouth daily.   Yes [provider]  irbesartan (AVAPRO) 150 MG tablet Take 150 mg by mouth daily.   Yes [provider]  oxybutynin (DITROPAN) 5 MG tablet Take 5 mg by mouth 3 (three) times daily.   Yes [provider]  PARoxetine (PAXIL) 20 MG tablet Take 20 mg by mouth daily.   Yes [provider]  Polyethyl Glycol-Propyl Glycol (SYSTANE) 0.4-0.3 % SOLN Place 2 drops into both eyes 2 (two) times daily.    Yes [provider]  pravastatin (PRAVACHOL) 40 MG tablet Take 40 mg by mouth at bedtime.    Yes [provider]  traMADol (ULTRAM) 50 MG tablet Take 1 tablet (50 mg total) by mouth daily. 01/11/19  Yes Eugenie Filler, MD  amLODipine (NORVASC) 10 MG tablet Take 1 tablet (10 mg total) by mouth daily. Patient not taking: Reported on 05/11/2019 01/11/19   Eugenie Filler, MD  aspirin EC 81 MG EC tablet Take 1 tablet (81 mg total) by mouth daily. Patient not taking: Reported on 05/11/2019 01/12/19   Eugenie Filler, MD  feeding supplement, ENSURE ENLIVE, (ENSURE ENLIVE) LIQD Take 237 mLs by mouth 2 (two) times daily between meals. Patient not taking: Reported on 05/11/2019 01/11/19   Eugenie Filler, MD  sodium bicarbonate 650 MG tablet Take 1 tablet (650 mg total) by mouth 2 (two) times daily. Patient not taking: Reported on 05/11/2019 01/11/19   Eugenie Filler, MD    tamsulosin [facility]) 0.4 MG CAPS capsule Take 1 capsule (0.4 mg total) by mouth daily after breakfast. Patient not taking: Reported on 05/11/2019 01/12/19   Eugenie Filler, MD  vitamin B-12 (CYANOCOBALAMIN) 1000 MCG tablet Take 1 tablet (1,000 mcg total) by mouth daily. Patient not taking: Reported on 05/11/2019 01/11/19   Eugenie Filler, MD    Allergies    Patient has no known allergies.  Review of Systems   Review of Systems  Unable to perform ROS: Dementia  Constitutional: Positive for fever.  Gastrointestinal: Positive for vomiting.    Physical Exam Updated Vital Signs BP 131/84 (BP Location: Left Arm)   Pulse (!) 107  Temp 99.3 F (37.4 C) (Oral)   Resp (!) 21   SpO2 96%   Physical Exam CONSTITUTIONAL: Elderly, no acute distress HEAD: Normocephalic/atraumatic EYES: EOMI ENMT: Mucous membranes moist NECK: supple no meningeal signs CV: S1/S2 noted, no murmurs/rubs/gallops noted LUNGS: Lungs are clear to auscultation bilaterally, no apparent distress ABDOMEN: soft, nontender, no rebound or guarding, bowel sounds noted throughout abdomen GU:no cva tenderness NEURO: Pt is awake/alert, no distress.  Left-sided hemiparesis noted EXTREMITIES: pulses normal/equal, full ROM, pelvis stable, no signs of trauma SKIN: warm, color normal PSYCH: Unable to assess  ED Results / Procedures / Treatments   Labs (all labs ordered are listed, but only abnormal results are displayed) Labs Reviewed  CBC WITH DIFFERENTIAL/PLATELET - Abnormal; Notable for the following components:      Result Value   WBC 16.6 (*)    RBC 3.60 (*)    Hemoglobin 9.3 (*)    HCT 29.4 (*)    MCH 25.8 (*)    RDW 17.2 (*)    Neutro Abs 13.7 (*)    Monocytes Absolute 1.1 (*)    Abs Immature Granulocytes 0.25 (*)    All other components within normal limits  COMPREHENSIVE METABOLIC PANEL - Abnormal; Notable for the following components:   Sodium 134 (*)    Potassium 2.7 (*)    Chloride 97 (*)    CO2 20  (*)    Glucose, Bld 124 (*)    BUN 84 (*)    Creatinine, Ser 2.69 (*)    Calcium 8.7 (*)    Albumin 2.0 (*)    AST 93 (*)    Total Bilirubin 1.5 (*)    GFR calc non Af Amer 20 (*)    GFR calc Af Amer 24 (*)    Anion gap 17 (*)    All other components within normal limits  URINALYSIS, ROUTINE W REFLEX MICROSCOPIC - Abnormal; Notable for the following components:   APPearance HAZY (*)    Hgb urine dipstick MODERATE (*)    Protein, ur 100 (*)    Leukocytes,Ua LARGE (*)    WBC, UA >50 (*)    Bacteria, UA RARE (*)    All other components within normal limits  VALPROIC ACID LEVEL - Abnormal; Notable for the following components:   Valproic Acid Lvl <10 (*)    All other components within normal limits  RESPIRATORY PANEL BY RT PCR (FLU A&B, COVID)  URINE CULTURE  LIPASE, BLOOD  LACTIC ACID, PLASMA    EKG EKG Interpretation  Date/Time:  Wednesday May 11 2019 01:25:38 EST Ventricular Rate:  125 PR Interval:    QRS Duration: 95 QT Interval:  327 QTC Calculation: 466 R Axis:   80 Text Interpretation: Sinus tachycardia Repolarization abnormality, prob rate related Interpretation limited secondary to artifact Confirmed by Ripley Fraise [id number]) on 05/11/2019 1:35:52 AM   Radiology DG Chest Port 1 View  Result Date: 05/11/2019 CLINICAL DATA:  86 year old male with vomiting. Positive COVID-19 EXAM: PORTABLE CHEST 1 VIEW COMPARISON:  Chest radiograph dated 01/07/2019. FINDINGS: Faint right infrahilar density similar to prior radiograph may represent all Alexis or minimal scarring. No focal consolidation, pleural effusion, or pneumothorax. The cardiac silhouette is within normal limits. Atherosclerotic calcification of the aorta. No acute osseous pathology. IMPRESSION: No active disease. No interval change. Electronically Signed   By: Anner Crete M.D.   On: 05/11/2019 02:25    Procedures .Critical Care Performed by: Ripley Fraise, MD Authorized by: Ripley Fraise, MD  Critical care provider statement:    Critical care time (minutes):  54   Critical care start time:  05/11/2019 3:59 AM   Critical care end time:  05/11/2019 4:53 AM   Critical care time was exclusive of:  Separately billable procedures and treating other patients   Critical care was necessary to treat or prevent imminent or life-threatening deterioration of the following conditions:  Renal failure and sepsis   Critical care was time spent personally by me on the following activities:  Ordering and review of laboratory studies, ordering and review of radiographic studies, pulse oximetry, re-evaluation of patient's condition, discussions with consultants, evaluation of patient's response to treatment, review of old charts and examination of patient    Medications Ordered in ED Medications  potassium chloride 10 mEq in 100 mL IVPB (10 mEq Intravenous New Bag/Given 05/11/19 0442)  sodium chloride 0.9 % bolus 1,000 mL (1,000 mLs Intravenous New Bag/Given 05/11/19 0439)  cefTRIAXone (ROCEPHIN) 1 g in sodium chloride 0.9 % 100 mL IVPB (has no administration in time range)    ED Course  I have reviewed the triage vital signs and the nursing notes.  Pertinent labs & imaging results that were available during my care of the patient were reviewed by me and considered in my medical decision making (see chart for details).    MDM Rules/Calculators/A&P                      3:58 AM COVID-19 PCR testing here is negative.  Patient likely has a another infectious etiology.  Patient is found to have hypokalemia and acute renal failure 4:54 AM Pt will be admitted for dehydration, acute renal failure, UTI And HYPOkalemia Lactic acid negative Vitals stable No focal ABD tenderness. No distress D/w dr Hal Hope for admission  Final Clinical Impression(s) / ED Diagnoses Final diagnoses:  AKI (acute kidney injury) ([place])  Hypokalemia  Acute cystitis without hematuria  Dehydration    Rx / DC Orders ED  Discharge Orders    None       Ripley Fraise, MD 05/11/19 [phone]

## 2019-05-11 NOTE — Progress Notes (Signed)
Patient arrived to floor 0645. Patient is A & O x2 and on RA. Patient comfort measures addressed. NAD noted. Patient educated on fall precaution and low bed. Call bell within reach. Skin assessment done. Patient denied any needs at time. Will give report to ongoing shift.

## 2019-05-11 NOTE — Progress Notes (Signed)
PROGRESS NOTE  Manuel King [id number] DOB: [birth year] DOA: 05/11/2019 PCP: Merrilee Seashore, MD  HPI/Recap of past 24 hours: HPI from Dr [name] is a 86 y.o. male with history of stroke, dementia, hypertension, prostate cancer who was recently diagnosed with COVID-19 infection on December 15 about 3 weeks ago at the time patient was asymptomatic started having fever and chills and had 1 episode of nausea vomiting.  Patient was brought to the ER.  Patient has history of dementia but is giving appropriate history and states that he threw up once denies any abdominal pain denies any chest pain or shortness of breath.  Has been having some pain in the right knee and has a small wound in the right knee.  Patient states he usually walks with the help of walker. In the ER, afebrile, tachycardic COVID-19 test was negative, chest x-ray unremarkable.  UA shows features consistent with UTI.  Labs show creatinine has increased from 1.6-2.6 potassium 2.7 LFTs shows AST 93 bilirubin 1.5 WBC 16.6 lactic acid 1.4 hemoglobin is at baseline.  Patient started on empiric antibiotics for UTI, IV fluids for acute renal failure admitted for further management.    Today, patient denies any new complaints, appears fatigue, unable to engage in meaningful conversation.  Assessment/Plan: Active Problems:   Essential hypertension   PROSTATE CANCER, HX OF   History of CVA (cerebrovascular accident)   Vascular dementia (HCC)   AKI (acute kidney injury) ([place])   HLD (hyperlipidemia)   Hypokalemia   Nausea & vomiting   ARF (acute renal failure) (HCC)   Pressure injury of skin  SIRS likely from UTI Currently afebrile, with leukocytosis UA with large leukocytes, rare bacteria, greater than 50 WBC UC pending BC x2 pending collection Procalcitonin pending Continue IV fluids, IV ceftriaxone  AKI on CKD stage III Last creatinine on 01/2019 was 1.69, on presentation 2.69 Likely 2/2 poor oral  intake in addition to vomiting + on ARB + HCTZ IV fluids Continue to hold ARB, HCTZ Daily CMP  Anemia of CKD/history of iron deficiency Creatinine baseline between 8-9 Continue oral iron supplementation Daily CBC  Mild transaminitis AST 93, T bili 1.5, will trend If continues to rise, will image his abdomen Daily CMP  Hypokalemia Replace as needed  Hypertension Continue amlodipine, hydralazine, IV hydralazine as needed Continue to hold ARB, HCTZ  History of CVA Continue statins, aspirin  Depression Continue Depakote, Paxil  History of dementia Continue Aricept         Malnutrition Type:      Malnutrition Characteristics:      Nutrition Interventions:       Estimated body mass index is 25.25 kg/m as calculated from the following:   Height as of 01/07/19: 6\' 4"  (1.93 m).   Weight as of 01/11/19: 94.1 kg.     Code Status: Full  Family Communication: None at bedside  Disposition Plan: To be determined   Consultants:  None  Procedures:  None  Antimicrobials:  Ceftriaxone  DVT prophylaxis: Heparin [name]   Objective: Vitals:   05/11/19 0600 05/11/19 0651 05/11/19 0923 05/11/19 1446  BP: (!) 149/91 (!) 157/82 128/66 131/71  Pulse: (!) 109 (!) 103 94 98  Resp: 18 20 20 18   Temp:  98.6 F (37 C) 98.8 F (37.1 C) 99.9 F (37.7 C)  TempSrc:   Oral Oral  SpO2: 100% 100% 100% 97%    Intake/Output Summary (Last 24 hours) at 05/11/2019 1500 Last data filed at 05/11/2019 1000  Gross per 24 hour  Intake 1440 ml  Output --  Net 1440 ml   There were no vitals filed for this visit.  Exam:  General: NAD   Cardiovascular: S1, S2 present  Respiratory: CTAB  Abdomen: Soft, nontender, nondistended, bowel sounds present  Musculoskeletal: No bilateral pedal edema noted, small ulceration on right knee, tender  Skin:  As above  Psychiatry:  Unable to assess   Data Reviewed: CBC: Recent Labs  Lab 05/11/19 0237 05/11/19 0829  WBC  16.6* 17.8*  NEUTROABS 13.7* 15.2*  HGB 9.3* 8.7*  HCT 29.4* 27.9*  MCV 81.7 82.1  PLT 333 [id number]   Basic Metabolic Panel: Recent Labs  Lab 05/11/19 0237  NA 134*  K 2.7*  CL 97*  CO2 20*  GLUCOSE 124*  BUN 84*  CREATININE 2.69*  CALCIUM 8.7*   GFR: CrCl cannot be calculated (Unknown ideal weight.). Liver Function Tests: Recent Labs  Lab 05/11/19 0237  AST 93*  ALT 34  ALKPHOS 84  BILITOT 1.5*  PROT 8.0  ALBUMIN 2.0*   Recent Labs  Lab 05/11/19 0237  LIPASE 32   No results for input(s): AMMONIA in the last 168 hours. Coagulation Profile: No results for input(s): INR, PROTIME in the last 168 hours. Cardiac Enzymes: No results for input(s): CKTOTAL, CKMB, CKMBINDEX, TROPONINI in the last 168 hours. BNP (last 3 results) No results for input(s): PROBNP in the last 8760 hours. HbA1C: No results for input(s): HGBA1C in the last 72 hours. CBG: No results for input(s): GLUCAP in the last 168 hours. Lipid Profile: No results for input(s): CHOL, HDL, LDLCALC, TRIG, CHOLHDL, LDLDIRECT in the last 72 hours. Thyroid Function Tests: No results for input(s): TSH, T4TOTAL, FREET4, T3FREE, THYROIDAB in the last 72 hours. Anemia Panel: No results for input(s): VITAMINB12, FOLATE, FERRITIN, TIBC, IRON, RETICCTPCT in the last 72 hours. Urine analysis:    Component Value Date/Time   COLORURINE YELLOW 05/11/2019 0237   APPEARANCEUR HAZY (A) 05/11/2019 0237   LABSPEC 1.013 05/11/2019 0237   PHURINE 5.0 05/11/2019 0237   GLUCOSEU NEGATIVE 05/11/2019 0237   HGBUR MODERATE (A) 05/11/2019 0237   BILIRUBINUR NEGATIVE 05/11/2019 0237   KETONESUR NEGATIVE 05/11/2019 0237   PROTEINUR 100 (A) 05/11/2019 0237   UROBILINOGEN 0.2 12/21/2014 1409   NITRITE NEGATIVE 05/11/2019 0237   LEUKOCYTESUR LARGE (A) 05/11/2019 0237   Sepsis Labs: @LABRCNTIP (procalcitonin:4,lacticidven:4)  ) Recent Results (from the past 240 hour(s))  Respiratory Panel by RT PCR (Flu A&B, Covid) -  Nasopharyngeal Swab     Status: None   Collection Time: 05/11/19  2:37 AM   Specimen: Nasopharyngeal Swab  Result Value Ref Range Status   SARS Coronavirus 2 by RT PCR NEGATIVE NEGATIVE Final    Comment: (NOTE) SARS-CoV-2 target nucleic acids are NOT DETECTED. The SARS-CoV-2 RNA is generally detectable in upper respiratoy specimens during the acute phase of infection. The lowest concentration of SARS-CoV-2 viral copies this assay can detect is 131 copies/mL. A negative result does not preclude SARS-Cov-2 infection and should not be used as the sole basis for treatment or other patient management decisions. A negative result may occur with  improper specimen collection/handling, submission of specimen other than nasopharyngeal swab, presence of viral mutation(s) within the areas targeted by this assay, and inadequate number of viral copies (<131 copies/mL). A negative result must be combined with clinical observations, patient history, and epidemiological information. The expected result is Negative. Fact Sheet for Patients:  PinkCheek.be Fact Sheet for Healthcare Providers:  GravelBags.it  This test is not yet ap proved or cleared by the [place] and  has been authorized for detection and/or diagnosis of SARS-CoV-2 by FDA under an Emergency Use Authorization (EUA). This EUA will remain  in effect (meaning this test can be used) for the duration of the COVID-19 declaration under Section 564(b)(1) of the Act, 21 U.S.C. section 360bbb-3(b)(1), unless the authorization is terminated or revoked sooner.    Influenza A by PCR NEGATIVE NEGATIVE Final   Influenza B by PCR NEGATIVE NEGATIVE Final    Comment: (NOTE) The Xpert Xpress SARS-CoV-2/FLU/RSV assay is intended as an aid in  the diagnosis of influenza from Nasopharyngeal swab specimens and  should not be used as a sole basis for treatment. Nasal washings and  aspirates  are unacceptable for Xpert Xpress SARS-CoV-2/FLU/RSV  testing. Fact Sheet for Patients: PinkCheek.be Fact Sheet for Healthcare Providers: GravelBags.it This test is not yet approved or cleared by the [place] FDA and  has been authorized for detection and/or diagnosis of SARS-CoV-2 by  FDA under an Emergency Use Authorization (EUA). This EUA will remain  in effect (meaning this test can be used) for the duration of the  Covid-19 declaration under Section 564(b)(1) of the Act, 21  U.S.C. section 360bbb-3(b)(1), unless the authorization is  terminated or revoked. Performed at [facility], [place] [address], [place], [place] [address]       Studies: DG Chest Port 1 View  Result Date: 05/11/2019 CLINICAL DATA:  86 year old male with vomiting. Positive COVID-19 EXAM: PORTABLE CHEST 1 VIEW COMPARISON:  Chest radiograph dated 01/07/2019. FINDINGS: Faint right infrahilar density similar to prior radiograph may represent all Alexis or minimal scarring. No focal consolidation, pleural effusion, or pneumothorax. The cardiac silhouette is within normal limits. Atherosclerotic calcification of the aorta. No acute osseous pathology. IMPRESSION: No active disease. No interval change. Electronically Signed   By: Anner Crete M.D.   On: 05/11/2019 02:25    Scheduled Meds: . allopurinol  100 mg Oral Daily  . amLODipine  5 mg Oral Daily  . divalproex  250 mg Oral Daily  . divalproex  500 mg Oral QHS  . docusate sodium  100 mg Oral Daily  . donepezil  10 mg Oral QHS  . ferrous sulfate  325 mg Oral BID  . heparin  5,000 Units Subcutaneous Q8H  . hydrALAZINE  25 mg Oral Q8H  . oxybutynin  5 mg Oral TID  . PARoxetine  20 mg Oral Daily  . polyvinyl alcohol  2 drop Both Eyes BID  . pravastatin  40 mg Oral QHS    Continuous Infusions: . sodium chloride 75 mL/hr at 05/11/19 1426  . cefTRIAXone (ROCEPHIN)  IV        LOS: 0 days     Alma Friendly, MD [facility]  If 7PM-7AM, please contact night-coverage [email or web address] 05/11/2019, 3:00 PM

## 2019-05-11 NOTE — H&P (Signed)
History and Physical    Manuel King [id number] DOB: May 12, 1929 DOA: 05/11/2019  PCP: Merrilee Seashore, MD  Patient coming from: From skilled nursing facility.  Chief Complaint: Nausea vomiting fever chills.  HPI: Manuel King is a 86 y.o. male with history of stroke, dementia, hypertension, prostate cancer who was recently diagnosed with COVID-19 infection on December 15 about 3 weeks ago at the time patient was asymptomatic started having fever and chills and had 1 episode of nausea vomiting.  Patient was brought to the ER.  Patient has history of dementia but is giving appropriate history and states that he threw up once denies any abdominal pain denies any chest pain or shortness of breath.  Has been having some pain in the right knee and has a small wound in the right knee.  Patient states he usually walks with the help of walker.  ED Course: In the ER blood pressure was normal temperature was 99.3 tachycardic COVID-19 test was negative chest x-ray unremarkable.  UA shows features consistent with UTI.  Labs show creatinine has increased from 1.6-2.6 potassium 2.7 LFTs shows AST 93 bilirubin 1.5 WBC 16.6 lactic acid 1.4 hemoglobin is at baseline.  Patient started on empiric antibiotics for UTI IV fluids for acute renal failure admitted for further management.  Review of Systems: As per HPI, rest all negative.   Past Medical History:  Diagnosis Date  . Arthritis   . Cellulitis currently   right foot  . CVA (cerebral vascular accident) ([place])   . Dementia ([place])   . Hypertension   . Prostate cancer [facility])     Past Surgical History:  Procedure Laterality Date  . ANTERIOR CERVICAL DECOMP/DISCECTOMY FUSION  05/29/1999   Anterior C3 and C4 diskectomy, decompression of the spinal cord,bone bank graft, Synthes plate and microscope/notes 09/17/2010  . CARPAL TUNNEL RELEASE Left 09/27/1999   Archie Endo 09/17/2010  . CARPAL TUNNEL RELEASE Right 08/12/2000   Archie Endo 09/17/2010  . FOOT  SURGERY     Partial excision left medial cuneiform.; Partial excision left first metatarsal./notes 09/17/2010  . INSERTION PROSTATE RADIATION SEED  09/2005   Archie Endo 09/17/2010  . NECK SURGERY     "BOIL ON MY NECK"  . spider bite surgery     on left leg     reports that he has quit smoking. He quit after 15.00 years of use. He has never used smokeless tobacco. He reports that he does not drink alcohol or use drugs.  No Known Allergies  Family History  Problem Relation Age of Onset  . Hypotension Mother   . Diabetes type II Mother   . CAD Mother   . Hypotension Father   . CAD Father   . CAD Sister     Prior to Admission medications   Medication Sig Start Date End Date Taking? Authorizing Provider  acetaminophen (TYLENOL) 325 MG tablet Take 2 tablets (650 mg total) by mouth every 6 (six) hours as needed for mild pain (or Fever >/= 101). 01/11/19  Yes Eugenie Filler, MD  acetaminophen (TYLENOL) 500 MG tablet Take 500 mg by mouth 2 (two) times daily.   Yes [provider]  allopurinol (ZYLOPRIM) 100 MG tablet Take 100 mg by mouth daily.   Yes [provider]  amLODipine (NORVASC) 5 MG tablet Take 5 mg by mouth daily.   Yes [provider]  divalproex (DEPAKOTE) 250 MG DR tablet Take 250-500 mg by mouth See admin instructions. Take 250 mg  tablet in  the am and 500 mg  tablets in the pm   Yes [provider]  docusate sodium (COLACE) 100 MG capsule Take 100 mg by mouth daily.   Yes [provider]  donepezil (ARICEPT) 10 MG tablet Take 10 mg by mouth at bedtime.   Yes [provider]  ferrous sulfate 325 (65 FE) MG EC tablet Take 325 mg by mouth 2 (two) times daily.   Yes [provider]  hydrALAZINE (APRESOLINE) 25 MG tablet Take 1 tablet (25 mg total) by mouth every 8 (eight) hours. 12/25/14  Yes Theodis Blaze, MD  hydrochlorothiazide (HYDRODIURIL) 12.5 MG tablet Take 12.5 mg by mouth daily.   Yes [provider]    irbesartan (AVAPRO) 150 MG tablet Take 150 mg by mouth daily.   Yes [provider]  oxybutynin (DITROPAN) 5 MG tablet Take 5 mg by mouth 3 (three) times daily.   Yes [provider]  PARoxetine (PAXIL) 20 MG tablet Take 20 mg by mouth daily.   Yes [provider]  Polyethyl Glycol-Propyl Glycol (SYSTANE) 0.4-0.3 % SOLN Place 2 drops into both eyes 2 (two) times daily.    Yes [provider]  pravastatin (PRAVACHOL) 40 MG tablet Take 40 mg by mouth at bedtime.    Yes [provider]  traMADol (ULTRAM) 50 MG tablet Take 1 tablet (50 mg total) by mouth daily. 01/11/19  Yes Eugenie Filler, MD  amLODipine (NORVASC) 10 MG tablet Take 1 tablet (10 mg total) by mouth daily. Patient not taking: Reported on 05/11/2019 01/11/19   Eugenie Filler, MD  aspirin EC 81 MG EC tablet Take 1 tablet (81 mg total) by mouth daily. Patient not taking: Reported on 05/11/2019 01/12/19   Eugenie Filler, MD  feeding supplement, ENSURE ENLIVE, (ENSURE ENLIVE) LIQD Take 237 mLs by mouth 2 (two) times daily between meals. Patient not taking: Reported on 05/11/2019 01/11/19   Eugenie Filler, MD  sodium bicarbonate 650 MG tablet Take 1 tablet (650 mg total) by mouth 2 (two) times daily. Patient not taking: Reported on 05/11/2019 01/11/19   Eugenie Filler, MD  tamsulosin [facility]) 0.4 MG CAPS capsule Take 1 capsule (0.4 mg total) by mouth daily after breakfast. Patient not taking: Reported on 05/11/2019 01/12/19   Eugenie Filler, MD  vitamin B-12 (CYANOCOBALAMIN) 1000 MCG tablet Take 1 tablet (1,000 mcg total) by mouth daily. Patient not taking: Reported on 05/11/2019 01/11/19   Eugenie Filler, MD    Physical Exam: Constitutional: Moderately built and nourished. Vitals:   05/11/19 0244 05/11/19 0330 05/11/19 0400 05/11/19 0430  BP: 120/70 131/84 (!) 143/79 (!) 144/79  Pulse: (!) 102 (!) 107 (!) 102 98  Resp: 16 (!) 21 (!) 22 14  Temp:      TempSrc:      SpO2: 97% 96% 99%  98%   Eyes: Anicteric no pallor. ENMT: No discharge from the ears eyes nose or mouth. Neck: No mass felt.  No neck rigidity. Respiratory: No rhonchi or crepitations. Cardiovascular: S1-S2 heard. Abdomen: Soft nontender bowel sounds present. Musculoskeletal: No edema.  There are some small ulceration on the right knee. Skin: Small ulceration of the right knee which appears to be mildly tender. Neurologic: Alert awake oriented to his name and place moves all extremities. Psychiatric: Oriented to name and place.   Labs on Admission: I have personally reviewed following labs and imaging studies  CBC: Recent Labs  Lab 05/11/19 0237  WBC 16.6*  NEUTROABS 13.7*  HGB 9.3*  HCT 29.4*  MCV 81.7  PLT 0000000   Basic Metabolic Panel: Recent Labs  Lab 05/11/19 0237  NA 134*  K 2.7*  CL 97*  CO2 20*  GLUCOSE 124*  BUN 84*  CREATININE 2.69*  CALCIUM 8.7*   GFR: CrCl cannot be calculated (Unknown ideal weight.). Liver Function Tests: Recent Labs  Lab 05/11/19 0237  AST 93*  ALT 34  ALKPHOS 84  BILITOT 1.5*  PROT 8.0  ALBUMIN 2.0*   Recent Labs  Lab 05/11/19 0237  LIPASE 32   No results for input(s): AMMONIA in the last 168 hours. Coagulation Profile: No results for input(s): INR, PROTIME in the last 168 hours. Cardiac Enzymes: No results for input(s): CKTOTAL, CKMB, CKMBINDEX, TROPONINI in the last 168 hours. BNP (last 3 results) No results for input(s): PROBNP in the last 8760 hours. HbA1C: No results for input(s): HGBA1C in the last 72 hours. CBG: No results for input(s): GLUCAP in the last 168 hours. Lipid Profile: No results for input(s): CHOL, HDL, LDLCALC, TRIG, CHOLHDL, LDLDIRECT in the last 72 hours. Thyroid Function Tests: No results for input(s): TSH, T4TOTAL, FREET4, T3FREE, THYROIDAB in the last 72 hours. Anemia Panel: No results for input(s): VITAMINB12, FOLATE, FERRITIN, TIBC, IRON, RETICCTPCT in the last 72 hours. Urine analysis:    Component  Value Date/Time   COLORURINE YELLOW 05/11/2019 0237   APPEARANCEUR HAZY (A) 05/11/2019 0237   LABSPEC 1.013 05/11/2019 0237   PHURINE 5.0 05/11/2019 0237   GLUCOSEU NEGATIVE 05/11/2019 0237   HGBUR MODERATE (A) 05/11/2019 0237   BILIRUBINUR NEGATIVE 05/11/2019 0237   KETONESUR NEGATIVE 05/11/2019 0237   PROTEINUR 100 (A) 05/11/2019 0237   UROBILINOGEN 0.2 12/21/2014 1409   NITRITE NEGATIVE 05/11/2019 0237   LEUKOCYTESUR LARGE (A) 05/11/2019 0237   Sepsis Labs: @LABRCNTIP (procalcitonin:4,lacticidven:4) ) Recent Results (from the past 240 hour(s))  Respiratory Panel by RT PCR (Flu A&B, Covid) - Nasopharyngeal Swab     Status: None   Collection Time: 05/11/19  2:37 AM   Specimen: Nasopharyngeal Swab  Result Value Ref Range Status   SARS Coronavirus 2 by RT PCR NEGATIVE NEGATIVE Final    Comment: (NOTE) SARS-CoV-2 target nucleic acids are NOT DETECTED. The SARS-CoV-2 RNA is generally detectable in upper respiratoy specimens during the acute phase of infection. The lowest concentration of SARS-CoV-2 viral copies this assay can detect is 131 copies/mL. A negative result does not preclude SARS-Cov-2 infection and should not be used as the sole basis for treatment or other patient management decisions. A negative result may occur with  improper specimen collection/handling, submission of specimen other than nasopharyngeal swab, presence of viral mutation(s) within the areas targeted by this assay, and inadequate number of viral copies (<131 copies/mL). A negative result must be combined with clinical observations, patient history, and epidemiological information. The expected result is Negative. Fact Sheet for Patients:  PinkCheek.be Fact Sheet for Healthcare Providers:  GravelBags.it This test is not yet ap proved or cleared by the [place] FDA and  has been authorized for detection and/or diagnosis of SARS-CoV-2 by FDA  under an Emergency Use Authorization (EUA). This EUA will remain  in effect (meaning this test can be used) for the duration of the COVID-19 declaration under Section 564(b)(1) of the Act, 21 U.S.C. section 360bbb-3(b)(1), unless the authorization is terminated or revoked sooner.    Influenza A by PCR NEGATIVE NEGATIVE Final   Influenza B by PCR NEGATIVE NEGATIVE Final    Comment: (NOTE)  The Xpert Xpress SARS-CoV-2/FLU/RSV assay is intended as an aid in  the diagnosis of influenza from Nasopharyngeal swab specimens and  should not be used as a sole basis for treatment. Nasal washings and  aspirates are unacceptable for Xpert Xpress SARS-CoV-2/FLU/RSV  testing. Fact Sheet for Patients: PinkCheek.be Fact Sheet for Healthcare Providers: GravelBags.it This test is not yet approved or cleared by the [place] FDA and  has been authorized for detection and/or diagnosis of SARS-CoV-2 by  FDA under an Emergency Use Authorization (EUA). This EUA will remain  in effect (meaning this test can be used) for the duration of the  Covid-19 declaration under Section 564(b)(1) of the Act, 21  U.S.C. section 360bbb-3(b)(1), unless the authorization is  terminated or revoked. Performed at [facility], [place] [address], [place], [place] [address]      Radiological Exams on Admission: DG Chest Port 1 View  Result Date: 05/11/2019 CLINICAL DATA:  86 year old male with vomiting. Positive COVID-19 EXAM: PORTABLE CHEST 1 VIEW COMPARISON:  Chest radiograph dated 01/07/2019. FINDINGS: Faint right infrahilar density similar to prior radiograph may represent all Alexis or minimal scarring. No focal consolidation, pleural effusion, or pneumothorax. The cardiac silhouette is within normal limits. Atherosclerotic calcification of the aorta. No acute osseous pathology. IMPRESSION: No active disease. No interval change. Electronically Signed    By: Anner Crete M.D.   On: 05/11/2019 02:25    EKG: Independently reviewed.  Sinus tachycardia.  Assessment/Plan Active Problems:   Essential hypertension   PROSTATE CANCER, HX OF   History of CVA (cerebrovascular accident)   Vascular dementia (HCC)   AKI (acute kidney injury) ([place])   HLD (hyperlipidemia)   Hypokalemia   Nausea & vomiting   ARF (acute renal failure) ([place])    1. SIRS likely from UTI for which I have placed patient on ceftriaxone and IV fluids.  Follow cultures. 2. Acute on chronic kidney disease stage III likely from nausea vomiting holding of ARB and hydrochlorothiazide gently hydrating.  Follow metabolic panel. 3. Nausea vomiting could be from UTI.  Abdomen appears benign LFTs AST is mildly elevated.  Follow LFTs and closely monitor for any further worsening may need imaging if there is further worsening. 4. Hypertension we will continue with amlodipine holding ARB hydrochlorothiazide due to acute renal failure and hyperkalemia.  As needed IV hydralazine. 5. Hypokalemia likely from vomiting and also hydrochlorothiazide replace recheck. 6. History of dementia on Aricept. 7. Depression on Depakote and Paxil. 8. History of CVA on statins and aspirin. 9. History of prostate cancer per the chart.   DVT prophylaxis: Heparin. Code Status: Full code.  Patient has a MOST form which says full code. Family Communication: We will need to reach out with patient's family when available. Disposition Plan: Back to facility when stable. Consults called: None. Admission status: Observation.   Rise Patience MD [facility] Pager [phone].  If 7PM-7AM, please contact night-coverage [email or web address] Password [facility]  05/11/2019, 5:33 AM

## 2019-05-12 DIAGNOSIS — Z8249 Family history of ischemic heart disease and other diseases of the circulatory system: Secondary | ICD-10-CM | POA: Diagnosis not present

## 2019-05-12 DIAGNOSIS — I129 Hypertensive chronic kidney disease with stage 1 through stage 4 chronic kidney disease, or unspecified chronic kidney disease: Secondary | ICD-10-CM | POA: Diagnosis present

## 2019-05-12 DIAGNOSIS — Z833 Family history of diabetes mellitus: Secondary | ICD-10-CM | POA: Diagnosis not present

## 2019-05-12 DIAGNOSIS — N3000 Acute cystitis without hematuria: Secondary | ICD-10-CM | POA: Diagnosis present

## 2019-05-12 DIAGNOSIS — Z20822 Contact with and (suspected) exposure to covid-19: Secondary | ICD-10-CM | POA: Diagnosis present

## 2019-05-12 DIAGNOSIS — I471 Supraventricular tachycardia: Secondary | ICD-10-CM | POA: Diagnosis present

## 2019-05-12 DIAGNOSIS — Z79891 Long term (current) use of opiate analgesic: Secondary | ICD-10-CM | POA: Diagnosis not present

## 2019-05-12 DIAGNOSIS — N183 Chronic kidney disease, stage 3 unspecified: Secondary | ICD-10-CM | POA: Diagnosis present

## 2019-05-12 DIAGNOSIS — R6510 Systemic inflammatory response syndrome (SIRS) of non-infectious origin without acute organ dysfunction: Secondary | ICD-10-CM | POA: Diagnosis present

## 2019-05-12 DIAGNOSIS — E860 Dehydration: Secondary | ICD-10-CM | POA: Diagnosis present

## 2019-05-12 DIAGNOSIS — Z8673 Personal history of transient ischemic attack (TIA), and cerebral infarction without residual deficits: Secondary | ICD-10-CM

## 2019-05-12 DIAGNOSIS — Z87891 Personal history of nicotine dependence: Secondary | ICD-10-CM | POA: Diagnosis not present

## 2019-05-12 DIAGNOSIS — D631 Anemia in chronic kidney disease: Secondary | ICD-10-CM | POA: Diagnosis present

## 2019-05-12 DIAGNOSIS — E876 Hypokalemia: Secondary | ICD-10-CM | POA: Diagnosis present

## 2019-05-12 DIAGNOSIS — F0150 Vascular dementia without behavioral disturbance: Secondary | ICD-10-CM | POA: Diagnosis present

## 2019-05-12 DIAGNOSIS — Z981 Arthrodesis status: Secondary | ICD-10-CM | POA: Diagnosis not present

## 2019-05-12 DIAGNOSIS — R042 Hemoptysis: Secondary | ICD-10-CM | POA: Diagnosis not present

## 2019-05-12 DIAGNOSIS — I10 Essential (primary) hypertension: Secondary | ICD-10-CM | POA: Diagnosis not present

## 2019-05-12 DIAGNOSIS — E785 Hyperlipidemia, unspecified: Secondary | ICD-10-CM

## 2019-05-12 DIAGNOSIS — R7401 Elevation of levels of liver transaminase levels: Secondary | ICD-10-CM | POA: Diagnosis present

## 2019-05-12 DIAGNOSIS — M1990 Unspecified osteoarthritis, unspecified site: Secondary | ICD-10-CM | POA: Diagnosis not present

## 2019-05-12 DIAGNOSIS — F329 Major depressive disorder, single episode, unspecified: Secondary | ICD-10-CM | POA: Diagnosis present

## 2019-05-12 DIAGNOSIS — M25561 Pain in right knee: Secondary | ICD-10-CM | POA: Diagnosis present

## 2019-05-12 DIAGNOSIS — R05 Cough: Secondary | ICD-10-CM | POA: Diagnosis not present

## 2019-05-12 DIAGNOSIS — B9561 Methicillin susceptible Staphylococcus aureus infection as the cause of diseases classified elsewhere: Secondary | ICD-10-CM | POA: Diagnosis present

## 2019-05-12 DIAGNOSIS — Z8546 Personal history of malignant neoplasm of prostate: Secondary | ICD-10-CM | POA: Diagnosis not present

## 2019-05-12 DIAGNOSIS — N390 Urinary tract infection, site not specified: Secondary | ICD-10-CM

## 2019-05-12 DIAGNOSIS — N179 Acute kidney failure, unspecified: Secondary | ICD-10-CM | POA: Diagnosis present

## 2019-05-12 LAB — COMPREHENSIVE METABOLIC PANEL
ALT: 38 U/L (ref 0–44)
AST: 94 U/L — ABNORMAL HIGH (ref 15–41)
Albumin: 1.6 g/dL — ABNORMAL LOW (ref 3.5–5.0)
Alkaline Phosphatase: 78 U/L (ref 38–126)
Anion gap: 10 (ref 5–15)
BUN: 67 mg/dL — ABNORMAL HIGH (ref 8–23)
CO2: 18 mmol/L — ABNORMAL LOW (ref 22–32)
Calcium: 7.7 mg/dL — ABNORMAL LOW (ref 8.9–10.3)
Chloride: 103 mmol/L (ref 98–111)
Creatinine, Ser: 2.01 mg/dL — ABNORMAL HIGH (ref 0.61–1.24)
GFR calc Af Amer: 34 mL/min — ABNORMAL LOW (ref >60)
GFR calc non Af Amer: 29 mL/min — ABNORMAL LOW (ref >60)
Glucose, Bld: 103 mg/dL — ABNORMAL HIGH (ref 70–99)
Potassium: 3.3 mmol/L — ABNORMAL LOW (ref 3.5–5.1)
Sodium: 131 mmol/L — ABNORMAL LOW (ref 135–145)
Total Bilirubin: 0.3 mg/dL (ref 0.3–1.2)
Total Protein: 6.1 g/dL — ABNORMAL LOW (ref 6.5–8.1)

## 2019-05-12 LAB — CBC WITH DIFFERENTIAL/PLATELET
Abs Immature Granulocytes: 0.21 10*3/uL — ABNORMAL HIGH (ref 0.00–0.07)
Basophils Absolute: 0 10*3/uL (ref 0.0–0.1)
Basophils Relative: 0 %
Eosinophils Absolute: 0.1 10*3/uL (ref 0.0–0.5)
Eosinophils Relative: 1 %
HCT: 22.6 % — ABNORMAL LOW (ref 39.0–52.0)
Hemoglobin: 7.1 g/dL — ABNORMAL LOW (ref 13.0–17.0)
Immature Granulocytes: 2 %
Lymphocytes Relative: 15 %
Lymphs Abs: 1.9 10*3/uL (ref 0.7–4.0)
MCH: 25.8 pg — ABNORMAL LOW (ref 26.0–34.0)
MCHC: 31.4 g/dL (ref 30.0–36.0)
MCV: 82.2 fL (ref 80.0–100.0)
Monocytes Absolute: 0.9 10*3/uL (ref 0.1–1.0)
Monocytes Relative: 7 %
Neutro Abs: 9.2 10*3/uL — ABNORMAL HIGH (ref 1.7–7.7)
Neutrophils Relative %: 75 %
Platelets: 266 10*3/uL (ref 150–400)
RBC: 2.75 MIL/uL — ABNORMAL LOW (ref 4.22–5.81)
RDW: 17.2 % — ABNORMAL HIGH (ref 11.5–15.5)
WBC: 12.4 10*3/uL — ABNORMAL HIGH (ref 4.0–10.5)
nRBC: 0 % (ref 0.0–0.2)

## 2019-05-12 LAB — MAGNESIUM: Magnesium: 2.1 mg/dL (ref 1.7–2.4)

## 2019-05-12 LAB — PROCALCITONIN: Procalcitonin: 1.41 ng/mL

## 2019-05-12 MED ORDER — POTASSIUM CHLORIDE CRYS ER 20 MEQ PO TBCR
40.0000 meq | EXTENDED_RELEASE_TABLET | Freq: Once | ORAL | Status: AC
  Administered 2019-05-12: 09:00:00 40 meq via ORAL
  Filled 2019-05-12: qty 2

## 2019-05-12 NOTE — TOC Initial Note (Signed)
Transition of Care [facility]) - Initial/Assessment Note    Patient Details  Name: Manuel King MRN: [id number] Date of Birth: [birth year]  Transition of Care [facility]) CM/SW Contact:    Lia Hopping, [place] Phone Number: 05/12/2019, 4:41 PM  Clinical Narrative:  Patient admitted for Nausea and Vomiting.  Patient sister requested CSW call her about the patient discharge plan.                 CSW reached out to the patient sister Adelfa Koh. She reports the patient is Long term care resident at [facility]. Patient uses a walker at baseline. Sister states he went to  SNF in the past for rehab and did well and prefer SNF rehab before going to ALF. Patient receives care with bathing and dressing. FL2 completed.  CSW will follow up with bed offers in the am.  Facility will need a COVID-19 test 24 hours before discharge.  [facility] insurance authorization to be submitted.     Expected Discharge Plan: Skilled Nursing Facility Barriers to Discharge: Continued Medical Work up   Patient Goals and CMS Choice   CMS Medicare.gov Compare Post Acute Care list provided to:: Other (Comment Required)(Sibling) Choice offered to / list presented to : Sibling  Expected Discharge Plan and Services Expected Discharge Plan: [place] In-house Referral: Clinical Social Work Discharge Planning Services: CM Consult Post Acute Care Choice: [place] arrangements for the past 2 months: [place]                                      Prior Living Arrangements/Services Living arrangements for the past 2 months: Advance Lives with:: Facility Resident Patient language and need for interpreter reviewed:: No Do you feel safe going back to the place where you live?: Yes      Need for Family Participation in Patient Care: Yes (Comment) Care giver support system in place?: Yes (comment)   Criminal Activity/Legal Involvement Pertinent to  Current Situation/Hospitalization: No - Comment as needed  Activities of Daily Living Home Assistive Devices/Equipment: Blood pressure cuff, Dentures (specify type), Eyeglasses, Grab bars around toilet, Grab bars in shower, Hand-held shower hose, Hospital bed, Scales, Walker (specify type)(upper denture, front wheeled walker-[facility] has necessary equipment for their residents) ADL Screening (condition at time of admission) Patient's cognitive ability adequate to safely complete daily activities?: No Is the patient deaf or have difficulty hearing?: Yes(HOH) Does the patient have difficulty seeing, even when wearing glasses/contacts?: No Does the patient have difficulty concentrating, remembering, or making decisions?: Yes Patient able to express need for assistance with ADLs?: Yes Does the patient have difficulty dressing or bathing?: Yes Independently performs ADLs?: No Communication: Independent Dressing (OT): Needs assistance Is this a change from baseline?: Pre-admission baseline Grooming: Needs assistance Is this a change from baseline?: Pre-admission baseline Feeding: Needs assistance Is this a change from baseline?: Pre-admission baseline Bathing: Needs assistance Is this a change from baseline?: Pre-admission baseline Toileting: Needs assistance Is this a change from baseline?: Pre-admission baseline In/Out Bed: Needs assistance Is this a change from baseline?: Pre-admission baseline Walks in Home: Needs assistance Is this a change from baseline?: Pre-admission baseline Does the patient have difficulty walking or climbing stairs?: Yes(secondary to weakness) Weakness of Legs: Both(L>R) Weakness of Arms/Hands: Both(L>R)  Permission Sought/Granted Permission sought to share information with : Case Manager  Permission granted to share info w Relationship: Sister     Emotional Assessment Appearance:: Appears stated age     Orientation: : Oriented to Self Alcohol  / Substance Use: Not Applicable Psych Involvement: No (comment)  Admission diagnosis:  Dehydration [E86.0] Hypokalemia [E87.6] ARF (acute renal failure) (HCC) [N17.9] Acute cystitis without hematuria [N30.00] AKI (acute kidney injury) ([place]) [N17.9] Patient Active Problem List   Diagnosis Date Noted  . Hypokalemia 05/11/2019  . Nausea & vomiting 05/11/2019  . ARF (acute renal failure) ([place]) 05/11/2019  . Pressure injury of skin 05/11/2019  . Acute cystitis without hematuria   . Klebsiella pneumoniae infection   . Low vitamin B12 level 01/09/2019  . Acute lower UTI 01/09/2019  . HLD (hyperlipidemia) 08/18/2017  . Dementia ([place]) 08/18/2017  . Iron deficiency anemia 08/18/2017  . Acute renal failure superimposed on stage 3 chronic kidney disease ([place]) 08/18/2017  . Sepsis ([place]) 08/18/2017  . Urinary urgency 01/25/2015  . Ambulatory dysfunction 12/21/2014  . Generalized weakness 07/18/2014  . Fall 07/18/2014  . Malnutrition of moderate degree ([place]) 02/03/2014  . Bradycardia 02/01/2014  . Gout 09/16/2011  . Community acquired pneumonia 09/16/2011  . AKI (acute kidney injury) ([place]) 09/12/2011  . Falls 09/12/2011  . Acute parotitis 05/10/2011  . History of CVA (cerebrovascular accident) 05/10/2011  . Vascular dementia ([place]) 05/10/2011  . SLEEP APNEA 11/16/2008  . BRADYCARDIA 09/29/2008  . FATIGUE, CHRONIC 09/29/2008  . Essential hypertension 09/28/2008  . PROSTATE CANCER, HX OF 09/28/2008   PCP:  Merrilee Seashore, MD Pharmacy:   [facility] [place] [place] - [address] [place] [address] Phone: [phone] Fax: [phone]     Social Determinants of Health (SDOH) Interventions    Readmission Risk Interventions No flowsheet data found.

## 2019-05-12 NOTE — Progress Notes (Addendum)
PROGRESS NOTE  Manuel King [id number] DOB: December 22, 1929 DOA: 05/11/2019 PCP: Merrilee Seashore, MD  HPI/Recap of past 24 hours: HPI from Dr [name] is a 86 y.o. male with history of stroke, dementia, hypertension, prostate cancer who was recently diagnosed with COVID-19 infection on December 15 about 3 weeks ago at the time patient was asymptomatic started having fever and chills and had 1 episode of nausea vomiting.  Patient was brought to the ER.  Patient has history of dementia but is giving appropriate history and states that he threw up once denies any abdominal pain denies any chest pain or shortness of breath.  Has been having some pain in the right knee and has a small wound in the right knee.  Patient states he usually walks with the help of walker. In the ER, afebrile, tachycardic COVID-19 test was negative, chest x-ray unremarkable.  UA shows features consistent with UTI.  Labs show creatinine has increased from 1.6-2.6 potassium 2.7 LFTs shows AST 93 bilirubin 1.5 WBC 16.6 lactic acid 1.4 hemoglobin is at baseline.  Patient started on empiric antibiotics for UTI, IV fluids for acute renal failure admitted for further management.     Today, patient noted to have runs of SVT, with heart rate increasing to the 150s on telemetry, patient completely asymptomatic.  Patient denies any chest pain, shortness of breath, palpitations, abdominal pain, nausea/vomiting, fever/chills, although patient has a history of dementia and ROS unreliable.  Looks comfortable.     Assessment/Plan: Active Problems:   Essential hypertension   PROSTATE CANCER, HX OF   History of CVA (cerebrovascular accident)   Vascular dementia (HCC)   AKI (acute kidney injury) ([place])   HLD (hyperlipidemia)   Hypokalemia   Nausea & vomiting   ARF (acute renal failure) (HCC)   Pressure injury of skin  SIRS likely from UTI Currently afebrile, with leukocytosis UA with large leukocytes, rare  bacteria, greater than 50 WBC UC growing greater than 100,000 staph aureus, final report pending BC x2 NGTD Procalcitonin 1.89-->1.41 Continue IV ceftriaxone for now Monitor closely  AKI on CKD stage III Last creatinine on 01/2019 was 1.69, on presentation 2.69 Likely 2/2 poor oral intake in addition to vomiting + on ARB + HCTZ S/P IV fluids Continue to hold ARB, HCTZ Daily CMP  Anemia of CKD/history of iron deficiency Creatinine baseline between 8-9, trending downwards No obvious signs of bleeding Type and screen pending FOBT pending Continue oral iron supplementation Daily CBC  Mild transaminitis AST 93, T bili 1.5, improving Daily CMP, will trend  Hypokalemia Replace as needed  Hypertension Continue amlodipine, hydralazine, IV hydralazine as needed Continue to hold ARB, HCTZ  History of CVA Continue statins, aspirin  Depression Continue Depakote, Paxil  History of dementia Continue Aricept         Malnutrition Type:      Malnutrition Characteristics:      Nutrition Interventions:       Estimated body mass index is 22.15 kg/m as calculated from the following:   Height as of 01/07/19: 6\' 4"  (1.93 m).   Weight as of this encounter: 82.6 kg.     Code Status: Full  Family Communication: None at bedside  Disposition Plan: SNF, patient will require SNF to unable patient have a safe discharge.   Consultants:  None  Procedures:  None  Antimicrobials:  Ceftriaxone  DVT prophylaxis: Heparin [place]   Objective: Vitals:   05/12/19 0238 05/12/19 0520 05/12/19 0816 05/12/19 1404  BP: Marland Kitchen)  142/66 (!) 122/50 125/65 (!) 132/59  Pulse: 93 85 84 82  Resp: 14 19 16 17   Temp: 98.2 F (36.8 C) 98.3 F (36.8 C) 99.3 F (37.4 C) 98.2 F (36.8 C)  TempSrc: Oral Oral Oral Oral  SpO2: 100% 100% 100% 99%  Weight:  82.6 kg      Intake/Output Summary (Last 24 hours) at 05/12/2019 1613 Last data filed at 05/12/2019 1300 Gross per 24 hour  Intake  1773.67 ml  Output 1100 ml  Net 673.67 ml   Filed Weights   05/12/19 0520  Weight: 82.6 kg    Exam:  General: NAD   Cardiovascular: S1, S2 present  Respiratory: CTAB  Abdomen: Soft, nontender, nondistended, bowel sounds present  Musculoskeletal: No bilateral pedal edema noted  Skin: Normal  Psychiatry: Unable to assess   Data Reviewed: CBC: Recent Labs  Lab 05/11/19 0237 05/11/19 0829 05/12/19 0510  WBC 16.6* 17.8* 12.4*  NEUTROABS 13.7* 15.2* 9.2*  HGB 9.3* 8.7* 7.1*  HCT 29.4* 27.9* 22.6*  MCV 81.7 82.1 82.2  PLT 333 292 [id number]   Basic Metabolic Panel: Recent Labs  Lab 05/11/19 0237 05/12/19 0510  NA 134* 131*  K 2.7* 3.3*  CL 97* 103  CO2 20* 18*  GLUCOSE 124* 103*  BUN 84* 67*  CREATININE 2.69* 2.01*  CALCIUM 8.7* 7.7*  MG  --  2.1   GFR: Estimated Creatinine Clearance: 30.3 mL/min (A) (by C-G formula based on SCr of 2.01 mg/dL (H)). Liver Function Tests: Recent Labs  Lab 05/11/19 0237 05/12/19 0510  AST 93* 94*  ALT 34 38  ALKPHOS 84 78  BILITOT 1.5* 0.3  PROT 8.0 6.1*  ALBUMIN 2.0* 1.6*   Recent Labs  Lab 05/11/19 0237  LIPASE 32   No results for input(s): AMMONIA in the last 168 hours. Coagulation Profile: No results for input(s): INR, PROTIME in the last 168 hours. Cardiac Enzymes: No results for input(s): CKTOTAL, CKMB, CKMBINDEX, TROPONINI in the last 168 hours. BNP (last 3 results) No results for input(s): PROBNP in the last 8760 hours. HbA1C: No results for input(s): HGBA1C in the last 72 hours. CBG: No results for input(s): GLUCAP in the last 168 hours. Lipid Profile: No results for input(s): CHOL, HDL, LDLCALC, TRIG, CHOLHDL, LDLDIRECT in the last 72 hours. Thyroid Function Tests: No results for input(s): TSH, T4TOTAL, FREET4, T3FREE, THYROIDAB in the last 72 hours. Anemia Panel: No results for input(s): VITAMINB12, FOLATE, FERRITIN, TIBC, IRON, RETICCTPCT in the last 72 hours. Urine analysis:    Component Value  Date/Time   COLORURINE YELLOW 05/11/2019 0237   APPEARANCEUR HAZY (A) 05/11/2019 0237   LABSPEC 1.013 05/11/2019 0237   PHURINE 5.0 05/11/2019 0237   GLUCOSEU NEGATIVE 05/11/2019 0237   HGBUR MODERATE (A) 05/11/2019 0237   BILIRUBINUR NEGATIVE 05/11/2019 0237   KETONESUR NEGATIVE 05/11/2019 0237   PROTEINUR 100 (A) 05/11/2019 0237   UROBILINOGEN 0.2 12/21/2014 1409   NITRITE NEGATIVE 05/11/2019 0237   LEUKOCYTESUR LARGE (A) 05/11/2019 0237   Sepsis Labs: @LABRCNTIP (procalcitonin:4,lacticidven:4)  ) Recent Results (from the past 240 hour(s))  Respiratory Panel by RT PCR (Flu A&B, Covid) - Nasopharyngeal Swab     Status: None   Collection Time: 05/11/19  2:37 AM   Specimen: Nasopharyngeal Swab  Result Value Ref Range Status   SARS Coronavirus 2 by RT PCR NEGATIVE NEGATIVE Final    Comment: (NOTE) SARS-CoV-2 target nucleic acids are NOT DETECTED. The SARS-CoV-2 RNA is generally detectable in upper respiratoy specimens during the acute  phase of infection. The lowest concentration of SARS-CoV-2 viral copies this assay can detect is 131 copies/mL. A negative result does not preclude SARS-Cov-2 infection and should not be used as the sole basis for treatment or other patient management decisions. A negative result may occur with  improper specimen collection/handling, submission of specimen other than nasopharyngeal swab, presence of viral mutation(s) within the areas targeted by this assay, and inadequate number of viral copies (<131 copies/mL). A negative result must be combined with clinical observations, patient history, and epidemiological information. The expected result is Negative. Fact Sheet for Patients:  PinkCheek.be Fact Sheet for Healthcare Providers:  GravelBags.it This test is not yet ap proved or cleared by the [place] FDA and  has been authorized for detection and/or diagnosis of SARS-CoV-2 by FDA  under an Emergency Use Authorization (EUA). This EUA will remain  in effect (meaning this test can be used) for the duration of the COVID-19 declaration under Section 564(b)(1) of the Act, 21 U.S.C. section 360bbb-3(b)(1), unless the authorization is terminated or revoked sooner.    Influenza A by PCR NEGATIVE NEGATIVE Final   Influenza B by PCR NEGATIVE NEGATIVE Final    Comment: (NOTE) The Xpert Xpress SARS-CoV-2/FLU/RSV assay is intended as an aid in  the diagnosis of influenza from Nasopharyngeal swab specimens and  should not be used as a sole basis for treatment. Nasal washings and  aspirates are unacceptable for Xpert Xpress SARS-CoV-2/FLU/RSV  testing. Fact Sheet for Patients: PinkCheek.be Fact Sheet for Healthcare Providers: GravelBags.it This test is not yet approved or cleared by the [place] FDA and  has been authorized for detection and/or diagnosis of SARS-CoV-2 by  FDA under an Emergency Use Authorization (EUA). This EUA will remain  in effect (meaning this test can be used) for the duration of the  Covid-19 declaration under Section 564(b)(1) of the Act, 21  U.S.C. section 360bbb-3(b)(1), unless the authorization is  terminated or revoked. Performed at [facility], [place] [address] [place], [place] [address]   Urine culture     Status: Abnormal (Preliminary result)   Collection Time: 05/11/19  2:37 AM   Specimen: In/Out Cath Urine  Result Value Ref Range Status   Specimen Description   Final    IN/OUT CATH URINE Performed at [facility] [address], [place], [place] [address]    Special Requests   Final    NONE Performed at [facility], [place] [address] [place], [place] [address]    Culture (A)  Final    >=100,000 COLONIES/mL STAPHYLOCOCCUS AUREUS SUSCEPTIBILITIES TO FOLLOW Performed at [facility], [place] [address]  [place], [place] [address]    Report Status PENDING  Incomplete  Culture, blood (routine x 2)     Status: None (Preliminary result)   Collection Time: 05/11/19  4:10 PM   Specimen: BLOOD LEFT ARM  Result Value Ref Range Status   Specimen Description   Final    BLOOD LEFT ARM Performed at [facility], [place] [address], [place],  [address]    Special Requests   Final    BOTTLES DRAWN AEROBIC AND ANAEROBIC Blood Culture adequate volume Performed at [facility] [address], [place],  [address]    Culture   Final    NO GROWTH < 24 HOURS Performed at [facility] [address], [place],  [address]    Report Status PENDING  Incomplete  Culture,  blood (routine x 2)     Status: None (Preliminary result)   Collection Time: 05/11/19  4:10 PM   Specimen: BLOOD LEFT HAND  Result Value Ref Range Status   Specimen Description   Final    BLOOD LEFT HAND Performed at [facility] [address] [place], [place] [address]    Special Requests   Final    BOTTLES DRAWN AEROBIC ONLY Blood Culture adequate volume Performed at [facility] [address], [place], [place] [address]    Culture   Final    NO GROWTH < 24 HOURS Performed at [facility] [address], [place],  [address]    Report Status PENDING  Incomplete      Studies: No results found.  Scheduled Meds: . allopurinol  100 mg Oral Daily  . amLODipine  5 mg Oral Daily  . divalproex  250 mg Oral Daily  . divalproex  500 mg Oral QHS  . docusate sodium  100 mg Oral Daily  . donepezil  10 mg Oral QHS  . ferrous sulfate  325 mg Oral BID  . heparin  5,000 Units Subcutaneous Q8H  . hydrALAZINE  25 mg Oral Q8H  . oxybutynin  5 mg Oral TID  . PARoxetine  20 mg Oral Daily  . polyvinyl alcohol  2 drop Both Eyes BID  . pravastatin  40 mg Oral QHS    Continuous Infusions: . cefTRIAXone (ROCEPHIN)  IV 2 g  (05/11/19 1740)     LOS: 0 days     Alma Friendly, MD [facility]  If 7PM-7AM, please contact night-coverage [email or web address] 05/12/2019, 4:13 PM

## 2019-05-12 NOTE — NC FL2 (Signed)
[place] LEVEL OF CARE SCREENING TOOL     IDENTIFICATION  Patient Name: Manuel King Birthdate: May 09, 1929 Sex: male Admission Date (Current Location): 05/11/2019  [facility] and [place] Number:  Herbalist and Address:  [facility],  [place], [place]      Provider Number: [id number]  Attending Physician Name and Address:  Alma Friendly, MD  Relative Name and Phone Number:  [facility] Daughter [phone]  [phone]    Current Level of Care: Hospital Recommended Level of Care: [place] Prior Approval Number:    Date Approved/Denied:   PASRR Number: [id number]  Discharge Plan: SNF    Current Diagnoses: Patient Active Problem List   Diagnosis Date Noted  . Hypokalemia 05/11/2019  . Nausea & vomiting 05/11/2019  . ARF (acute renal failure) ([place]) 05/11/2019  . Pressure injury of skin 05/11/2019  . Acute cystitis without hematuria   . Klebsiella pneumoniae infection   . Low vitamin B12 level 01/09/2019  . Acute lower UTI 01/09/2019  . HLD (hyperlipidemia) 08/18/2017  . Dementia ([place]) 08/18/2017  . Iron deficiency anemia 08/18/2017  . Acute renal failure superimposed on stage 3 chronic kidney disease ([place]) 08/18/2017  . Sepsis ([place]) 08/18/2017  . Urinary urgency 01/25/2015  . Ambulatory dysfunction 12/21/2014  . Generalized weakness 07/18/2014  . Fall 07/18/2014  . Malnutrition of moderate degree ([place]) 02/03/2014  . Bradycardia 02/01/2014  . Gout 09/16/2011  . Community acquired pneumonia 09/16/2011  . AKI (acute kidney injury) ([place]) 09/12/2011  . Falls 09/12/2011  . Acute parotitis 05/10/2011  . History of CVA (cerebrovascular accident) 05/10/2011  . Vascular dementia ([place]) 05/10/2011  . SLEEP APNEA 11/16/2008  . BRADYCARDIA 09/29/2008  . FATIGUE, CHRONIC 09/29/2008  . Essential hypertension 09/28/2008  . PROSTATE CANCER, HX OF 09/28/2008    Orientation RESPIRATION BLADDER  Height & Weight     Self  Normal Continent Weight: 182 lb (82.6 kg) Height:     BEHAVIORAL SYMPTOMS/MOOD NEUROLOGICAL BOWEL NUTRITION STATUS      Continent Diet(Low Sodium Heart Healthy)  AMBULATORY STATUS COMMUNICATION OF NEEDS Skin   Extensive Assist Verbally Normal                       Personal Care Assistance Level of Assistance  Bathing, Feeding, Dressing Bathing Assistance: Maximum assistance Feeding assistance: Limited assistance Dressing Assistance: Maximum assistance     Functional Limitations Info  Sight, Hearing, Speech Sight Info: Adequate Hearing Info: Adequate Speech Info: Adequate    SPECIAL CARE FACTORS FREQUENCY  PT (By licensed PT), OT (By licensed OT)     PT Frequency: 5x/week OT Frequency: 5x/week            Contractures Contractures Info: Not present    Additional Factors Info  Psychotropic Code Status Info: Fullcode Allergies Info: Allergies: No Known Allergies Psychotropic Info: Aricept, Depakote, Paxil         Current Medications (05/12/2019):  This is the current hospital active medication list Current Facility-Administered Medications  Medication Dose Route Frequency Provider Last Rate Last Admin  . acetaminophen (TYLENOL) tablet 650 mg  650 mg Oral Q6H PRN Rise Patience, MD   650 mg at 05/12/19 1544   Or  . acetaminophen (TYLENOL) suppository 650 mg  650 mg Rectal Q6H PRN Rise Patience, MD      . allopurinol (ZYLOPRIM) tablet 100 mg  100 mg Oral Daily Rise Patience, MD   100 mg  at 05/12/19 0840  . amLODipine (NORVASC) tablet 5 mg  5 mg Oral Daily Rise Patience, MD   5 mg at 05/12/19 [id number]  . cefTRIAXone (ROCEPHIN) 2 g in sodium chloride 0.9 % 100 mL IVPB  2 g Intravenous Q24H Rise Patience, MD 200 mL/hr at 05/11/19 1740 2 g at 05/11/19 1740  . divalproex (DEPAKOTE) DR tablet 250 mg  250 mg Oral Daily Polly Cobia, RPH   250 mg at 05/12/19 [id number]  . divalproex (DEPAKOTE) DR tablet 500 mg  500 mg  Oral QHS Polly Cobia, RPH   500 mg at 05/11/19 2119  . docusate sodium (COLACE) capsule 100 mg  100 mg Oral Daily Rise Patience, MD   100 mg at 05/12/19 0841  . donepezil (ARICEPT) tablet 10 mg  10 mg Oral QHS Rise Patience, MD   10 mg at 05/11/19 2118  . ferrous sulfate tablet 325 mg  325 mg Oral BID Rise Patience, MD   325 mg at 05/12/19 [id number]  . heparin injection 5,000 Units  5,000 Units Subcutaneous Q8H Rise Patience, MD   5,000 Units at 05/12/19 1410  . hydrALAZINE (APRESOLINE) injection 10 mg  10 mg Intravenous Q4H PRN Rise Patience, MD      . hydrALAZINE (APRESOLINE) tablet 25 mg  25 mg Oral Q8H Rise Patience, MD   25 mg at 05/12/19 1408  . ondansetron (ZOFRAN) tablet 4 mg  4 mg Oral Q6H PRN Rise Patience, MD       Or  . ondansetron [facility]) injection 4 mg  4 mg Intravenous Q6H PRN Rise Patience, MD      . oxybutynin (DITROPAN) tablet 5 mg  5 mg Oral TID Rise Patience, MD   5 mg at 05/12/19 1544  . PARoxetine (PAXIL) tablet 20 mg  20 mg Oral Daily Rise Patience, MD   20 mg at 05/12/19 0840  . polyvinyl alcohol (LIQUIFILM TEARS) 1.4 % ophthalmic solution 2 drop  2 drop Both Eyes BID Rise Patience, MD   2 drop at 05/12/19 [phone]  . pravastatin (PRAVACHOL) tablet 40 mg  40 mg Oral QHS Rise Patience, MD   40 mg at 05/11/19 2119     Discharge Medications: Please see discharge summary for a list of discharge medications.  Relevant Imaging Results:  Relevant Lab Results:   Additional Information [id number]  Lia Hopping, LCSW

## 2019-05-12 NOTE — Progress Notes (Signed)
RN notified by telemetry that patient HR increase to 152 and reported a run of SVT.MD notified.

## 2019-05-12 NOTE — Evaluation (Signed)
Occupational Therapy Evaluation Patient Details Name: Manuel King MRN: [id number] DOB: March 23, 1930 Today's Date: 05/12/2019    History of Present Illness 86 y.o. male with history of stroke, dementia, hypertension, prostate cancer who was recently diagnosed with COVID-19 infection on December 15 about 3 weeks ago at the time patient was asymptomatic. Pt admitted with fever and chills and had 1 episode of nausea vomiting. Dx of SIRS, UTI, acute on chronic kidney failure.   Clinical Impression   Pt was admitted for the above. He was last seen in Sept, 2020 by OT and needs more assistance with mobility at this time.  Unsure of PLOF.  Will follow with mod +2 assist for mobility as precursor for adls    Follow Up Recommendations  (back to ALF, if they can manage vs SNF)    Equipment Recommendations  (3:1 commode)    Recommendations for Other Services       Precautions / Restrictions Precautions Precautions: Fall Precaution Comments: pt unable to provide fall hx 2* dementia, noted scab on R knee Restrictions Weight Bearing Restrictions: No      Mobility Bed Mobility Overal bed mobility: Needs Assistance Bed Mobility: Supine to Sit     Supine to sit: Max assist;+2 for physical assistance     General bed mobility comments: assist for legs and trunk  Transfers Overall transfer level: Needs assistance Equipment used: Rolling walker (2 wheeled) Transfers: Sit to/from [facility] Sit to Stand: Max assist;+2 physical assistance Stand pivot transfers: Max assist;+2 physical assistance       General transfer comment: assist to stand and stabilize.  Pt has difficulty standing straight, hips flexed; tends to lean L Doesn't put a lot of weight on LLE   Balance Overall balance assessment: Needs assistance Sitting-balance support: Feet supported Sitting balance-Leahy Scale: Poor Sitting balance - Comments: min guard to mod A sitting eob; tends to lean L      Standing balance-Leahy Scale: Zero                             ADL either performed or assessed with clinical judgement   ADL Overall ADL's : Needs assistance/impaired Eating/Feeding: Set up   Grooming: Minimal assistance                   Toilet Transfer: Maximal assistance;+2 for physical assistance;RW;Stand-pivot(to chair)             General ADL Comments: based on clinical judgment, pt needs mod A for UB and total A for LB adls     Vision         Perception     Praxis      Pertinent Vitals/Pain Pain Assessment: Faces Faces Pain Scale: Hurts a little bit Pain Location: headache Pain Descriptors / Indicators: Aching Pain Intervention(s): Limited activity within patient's tolerance;Monitored during session;Repositioned;Patient requesting pain meds-RN notified     Hand Dominance     Extremity/Trunk Assessment Upper Extremity Assessment Upper Extremity Assessment: Generalized weakness;LUE deficits/detail LUE Deficits / Details: FF limited to 30; hard end feel, did not use during adls but able to hold to [employer]           Communication Communication Communication: No difficulties   Cognition Arousal/Alertness: Awake/alert Behavior During Therapy: WFL for tasks assessed/performed Overall Cognitive Status: No family/caregiver present to determine baseline cognitive functioning  General Comments: h/o vascular dementia.  Provided some information:  I can't stand long, PLOF may not be accurate (walked with walker). Able to state he had 2 CVAs   General Comments  HR 80-102    Exercises     Shoulder Instructions      Home Living Family/patient expects to be discharged to:: Assisted living                                 Additional Comments: from [facility]      Prior Functioning/Environment Level of Independence: Needs assistance  Gait / Transfers Assistance Needed:  unknown     Comments: pt unreliable historian, he reported he, "needs help to move around"        OT Problem List: Decreased strength;Decreased activity tolerance;Impaired balance (sitting and/or standing);Decreased cognition;Decreased safety awareness;Pain      OT Treatment/Interventions: Self-care/ADL training;DME and/or AE instruction;Patient/family education;Balance training;Therapeutic activities    OT Goals(Current goals can be found in the care plan section) Acute Rehab OT Goals Patient Stated Goal: none stated OT Goal Formulation: With patient Time For Goal Achievement: 05/26/19 Potential to Achieve Goals: Fair ADL Goals Additional ADL Goal #1: pt will sit eob x 10 min with min guard assist in preparation for toilet transfers and adls Additional ADL Goal #2: pt will go from sit to stand with mod +2 for adls  OT Frequency: Min 2X/week   Barriers to D/C:            Co-evaluation PT/OT/SLP Co-Evaluation/Treatment: Yes Reason for Co-Treatment: For patient/therapist safety PT goals addressed during session: Mobility/safety with mobility;Balance OT goals addressed during session: ADL's and self-care      AM-PAC OT "6 Clicks" Daily Activity     Outcome Measure Help from another person eating meals?: A Little Help from another person taking care of personal grooming?: A Little Help from another person toileting, which includes using toliet, bedpan, or urinal?: Total Help from another person bathing (including washing, rinsing, drying)?: A Lot Help from another person to put on and taking off regular upper body clothing?: A Lot Help from another person to put on and taking off regular lower body clothing?: Total 6 Click Score: 12   End of Session Nurse Communication: Need for lift equipment(maximove pad placed; BTB 1 hr 2* sacrum)  Activity Tolerance: Patient limited by fatigue Patient left: in chair;with call bell/phone within reach;with chair alarm set  OT Visit  Diagnosis: Unsteadiness on feet (R26.81);Muscle weakness (generalized) (M62.81);Cognitive communication deficit (R41.841)                Time: [id number] OT Time Calculation (min): 24 min Charges:  OT General Charges $OT Visit: 1 Visit OT Evaluation $OT Eval Low Complexity: 1 Low  Alta Goding S, OTR/L Acute Rehabilitation Services 05/12/2019  Tradarius Reinwald 05/12/2019, 11:02 AM

## 2019-05-12 NOTE — Evaluation (Signed)
Physical Therapy Evaluation Patient Details Name: Manuel King MRN: [id number] DOB: May 26, 1929 Today's Date: 05/12/2019   History of Present Illness  86 y.o. male with history of stroke, dementia, hypertension, prostate cancer who was recently diagnosed with COVID-19 infection on December 15 about 3 weeks ago at the time patient was asymptomatic. Pt admitted with fever and chills and had 1 episode of nausea vomiting. Dx of SIRS, UTI, acute on chronic kidney failure.  Clinical Impression  Pt admitted with above diagnosis. +2 max assist for stand pivot transfer. Pt has heavy L lateral lean in sitting, requiring min to mod A to maintain sitting balance.  Pt currently with functional limitations due to the deficits listed below (see PT Problem List). Pt will benefit from skilled PT to increase their independence and safety with mobility to allow discharge to the venue listed below.       Follow Up Recommendations SNF;Supervision for mobility/OOB;Supervision/Assistance - 24 hour    Equipment Recommendations  Wheelchair cushion (measurements PT);Wheelchair (measurements PT)    Recommendations for Other Services       Precautions / Restrictions Precautions Precautions: Fall Precaution Comments: pt unable to provide fall hx 2* dementia, noted scab on R knee Restrictions Weight Bearing Restrictions: No      Mobility  Bed Mobility Overal bed mobility: Needs Assistance Bed Mobility: Supine to Sit     Supine to sit: Max assist;+2 for physical assistance     General bed mobility comments: assist for legs and trunk  Transfers Overall transfer level: Needs assistance Equipment used: Rolling walker (2 wheeled) Transfers: Sit to/from [facility] Sit to Stand: Max assist;+2 physical assistance Stand pivot transfers: Max assist;+2 physical assistance       General transfer comment: assist to stand and stabilize.  Pt has difficulty standing straight, hips flexed; tends to  lean L, decr WBing LLE  Ambulation/Gait                Stairs            Wheelchair Mobility    Modified Rankin (Stroke Patients Only)       Balance Overall balance assessment: Needs assistance Sitting-balance support: Feet supported Sitting balance-Leahy Scale: Poor Sitting balance - Comments: min guard to mod A sitting eob; tends to lean L     Standing balance-Leahy Scale: Zero                               Pertinent Vitals/Pain Pain Assessment: Faces Faces Pain Scale: Hurts a little bit Pain Location: headache Pain Descriptors / Indicators: Aching Pain Intervention(s): Monitored during session;Limited activity within patient's tolerance;Patient requesting pain meds-RN notified;Repositioned    Home Living Family/patient expects to be discharged to:: Assisted living                 Additional Comments: from [facility]    Prior Function Level of Independence: Needs assistance   Gait / Transfers Assistance Needed: unknown     Comments: pt unreliable historian, he reported he, "needs help to move around"     Hand Dominance        Extremity/Trunk Assessment   Upper Extremity Assessment Upper Extremity Assessment: Generalized weakness;LUE deficits/detail LUE Deficits / Details: FF limited to 30; hard end feel, did not use during adls but able to hold to RW    Lower Extremity Assessment Lower Extremity Assessment: Generalized weakness(R knee ext 4/5, L knee ext +3/5)  Communication   Communication: No difficulties  Cognition Arousal/Alertness: Awake/alert Behavior During Therapy: WFL for tasks assessed/performed Overall Cognitive Status: No family/caregiver present to determine baseline cognitive functioning                                 General Comments: h/o vascular dementia.  Provided some information:  I can't stand long, PLOF may not be accurate (walked with walker). Able to state he had 2  CVAs      General Comments General comments (skin integrity, edema, etc.): HR 80-102    Exercises     Assessment/Plan    PT Assessment Patient needs continued PT services  PT Problem List Decreased strength;Decreased mobility;Decreased activity tolerance;Decreased balance;Decreased cognition       PT Treatment Interventions Gait training;Functional mobility training;Therapeutic activities;Therapeutic exercise;DME instruction;Balance training;Patient/family education    PT Goals (Current goals can be found in the Care Plan section)  Acute Rehab PT Goals Patient Stated Goal: none stated PT Goal Formulation: Patient unable to participate in goal setting Time For Goal Achievement: 05/26/19 Potential to Achieve Goals: Fair    Frequency Min 2X/week   Barriers to discharge        Co-evaluation PT/OT/SLP Co-Evaluation/Treatment: Yes Reason for Co-Treatment: For patient/therapist safety PT goals addressed during session: Mobility/safety with mobility;Balance OT goals addressed during session: ADL's and self-care       AM-PAC PT "6 Clicks" Mobility  Outcome Measure Help needed turning from your back to your side while in a flat bed without using bedrails?: A Lot Help needed moving from lying on your back to sitting on the side of a flat bed without using bedrails?: A Lot Help needed moving to and from a bed to a chair (including a wheelchair)?: A Lot Help needed standing up from a chair using your arms (e.g., wheelchair or bedside chair)?: Total Help needed to walk in hospital room?: Total Help needed climbing 3-5 steps with a railing? : Total 6 Click Score: 9    End of Session Equipment Utilized During Treatment: Gait belt Activity Tolerance: Patient tolerated treatment well;No increased pain Patient left: in chair;with call bell/phone within reach;with chair alarm set(lift pad under pt) Nurse Communication: Mobility status;Need for lift equipment PT Visit Diagnosis:  Unsteadiness on feet (R26.81);Difficulty in walking, not elsewhere classified (R26.2);Muscle weakness (generalized) (M62.81)    Time: [id number] PT Time Calculation (min) (ACUTE ONLY): 28 min   Charges:   PT Evaluation $PT Eval Moderate Complexity: 1 Mod          Philomena Doheny PT 05/12/2019  Acute Rehabilitation Services Pager [phone] Office [phone]

## 2019-05-12 NOTE — Progress Notes (Signed)
LPN notified by telemetry that patient HR increase to 150's and reported run's of SVT. MD notified by [facility] [address] page.

## 2019-05-13 LAB — CBC WITH DIFFERENTIAL/PLATELET
Abs Immature Granulocytes: 0.39 10*3/uL — ABNORMAL HIGH (ref 0.00–0.07)
Basophils Absolute: 0 10*3/uL (ref 0.0–0.1)
Basophils Relative: 0 %
Eosinophils Absolute: 0.2 10*3/uL (ref 0.0–0.5)
Eosinophils Relative: 1 %
HCT: 22.7 % — ABNORMAL LOW (ref 39.0–52.0)
Hemoglobin: 7 g/dL — ABNORMAL LOW (ref 13.0–17.0)
Immature Granulocytes: 3 %
Lymphocytes Relative: 15 %
Lymphs Abs: 1.7 10*3/uL (ref 0.7–4.0)
MCH: 25.5 pg — ABNORMAL LOW (ref 26.0–34.0)
MCHC: 30.8 g/dL (ref 30.0–36.0)
MCV: 82.5 fL (ref 80.0–100.0)
Monocytes Absolute: 0.9 10*3/uL (ref 0.1–1.0)
Monocytes Relative: 7 %
Neutro Abs: 8.6 10*3/uL — ABNORMAL HIGH (ref 1.7–7.7)
Neutrophils Relative %: 74 %
Platelets: 269 10*3/uL (ref 150–400)
RBC: 2.75 MIL/uL — ABNORMAL LOW (ref 4.22–5.81)
RDW: 17.3 % — ABNORMAL HIGH (ref 11.5–15.5)
WBC: 11.8 10*3/uL — ABNORMAL HIGH (ref 4.0–10.5)
nRBC: 0 % (ref 0.0–0.2)

## 2019-05-13 LAB — URINE CULTURE: Culture: >=100000 — AB

## 2019-05-13 LAB — MRSA PCR SCREENING: MRSA by PCR: NEGATIVE

## 2019-05-13 LAB — COMPREHENSIVE METABOLIC PANEL
ALT: 39 U/L (ref 0–44)
AST: 76 U/L — ABNORMAL HIGH (ref 15–41)
Albumin: 1.6 g/dL — ABNORMAL LOW (ref 3.5–5.0)
Alkaline Phosphatase: 84 U/L (ref 38–126)
Anion gap: 10 (ref 5–15)
BUN: 55 mg/dL — ABNORMAL HIGH (ref 8–23)
CO2: 17 mmol/L — ABNORMAL LOW (ref 22–32)
Calcium: 7.8 mg/dL — ABNORMAL LOW (ref 8.9–10.3)
Chloride: 104 mmol/L (ref 98–111)
Creatinine, Ser: 1.8 mg/dL — ABNORMAL HIGH (ref 0.61–1.24)
GFR calc Af Amer: 38 mL/min — ABNORMAL LOW (ref >60)
GFR calc non Af Amer: 33 mL/min — ABNORMAL LOW (ref >60)
Glucose, Bld: 101 mg/dL — ABNORMAL HIGH (ref 70–99)
Potassium: 3.9 mmol/L (ref 3.5–5.1)
Sodium: 131 mmol/L — ABNORMAL LOW (ref 135–145)
Total Bilirubin: 0.3 mg/dL (ref 0.3–1.2)
Total Protein: 6.1 g/dL — ABNORMAL LOW (ref 6.5–8.1)

## 2019-05-13 LAB — FERRITIN: Ferritin: 1212 ng/mL — ABNORMAL HIGH (ref 24–336)

## 2019-05-13 LAB — IRON AND TIBC
Iron: 18 ug/dL — ABNORMAL LOW (ref 45–182)
Saturation Ratios: 14 % — ABNORMAL LOW (ref 17.9–39.5)
TIBC: 133 ug/dL — ABNORMAL LOW (ref 250–450)
UIBC: 115 ug/dL

## 2019-05-13 LAB — VITAMIN B12: Vitamin B-12: 987 pg/mL — ABNORMAL HIGH (ref 180–914)

## 2019-05-13 LAB — PREPARE RBC (CROSSMATCH)

## 2019-05-13 LAB — HEMOGLOBIN AND HEMATOCRIT, BLOOD
HCT: 24.8 % — ABNORMAL LOW (ref 39.0–52.0)
Hemoglobin: 7.8 g/dL — ABNORMAL LOW (ref 13.0–17.0)

## 2019-05-13 LAB — FOLATE: Folate: 5.5 ng/mL — ABNORMAL LOW (ref >5.9)

## 2019-05-13 LAB — PROCALCITONIN: Procalcitonin: 0.84 ng/mL

## 2019-05-13 MED ORDER — SODIUM CHLORIDE 0.9% IV SOLUTION: Freq: Once | INTRAVENOUS | Status: AC

## 2019-05-13 MED ORDER — SODIUM CHLORIDE 0.9 % IV SOLN
510.0000 mg | Freq: Once | INTRAVENOUS | Status: AC
  Administered 2019-05-13: 12:00:00 510 mg via INTRAVENOUS
  Filled 2019-05-13: qty 17

## 2019-05-13 MED ORDER — CEPHALEXIN 500 MG PO CAPS: 500.0000 mg | ORAL_CAPSULE | Freq: Two times a day (BID) | ORAL | Status: DC

## 2019-05-13 MED ORDER — CEPHALEXIN 500 MG PO CAPS
500.0000 mg | ORAL_CAPSULE | Freq: Four times a day (QID) | ORAL | Status: DC
  Administered 2019-05-13 – 2019-05-17 (×17): 500 mg via ORAL
  Filled 2019-05-13 (×18): qty 1

## 2019-05-13 NOTE — TOC Progression Note (Addendum)
Transition of Care [facility]) - Progression Note    Patient Details  Name: Manuel King MRN: [id number] Date of Birth: 1929/06/19  [facility] CM/SW Jefferson Davis, LCSW Phone Number: 05/13/2019, 4:40 PM  Clinical Narrative:    CSW provided daughter  SNF options.  Patient has 2 bed offers, neither has a male bed at this time. Patient daughter agreeable to either facility when a bed is available.  Patient will need COVID-19 test.   TOC staff will follow up the SNF's.   Expected Discharge Plan: [place] Barriers to Discharge: No SNF bed, Continued Medical Work up  Expected Discharge Plan and Services Expected Discharge Plan: [place] In-house Referral: Clinical Social Work Discharge Planning Services: CM Consult Post Acute Care Choice: [place] arrangements for the past 2 months: [place]                                       Social Determinants of Health (SDOH) Interventions    Readmission Risk Interventions No flowsheet data found.

## 2019-05-13 NOTE — Progress Notes (Signed)
PROGRESS NOTE  Manuel King [id number] DOB: 1929-12-22 DOA: 05/11/2019 PCP: Merrilee Seashore, MD  HPI/Recap of past 24 hours: HPI from Dr [name] is a 86 y.o. male with history of stroke, dementia, hypertension, prostate cancer who was recently diagnosed with COVID-19 infection on December 15 about 3 weeks ago at the time patient was asymptomatic started having fever and chills and had 1 episode of nausea vomiting.  Patient was brought to the ER.  Patient has history of dementia but is giving appropriate history and states that he threw up once denies any abdominal pain denies any chest pain or shortness of breath.  Has been having some pain in the right knee and has a small wound in the right knee.  Patient states he usually walks with the help of walker. In the ER, afebrile, tachycardic COVID-19 test was negative, chest x-ray unremarkable.  UA shows features consistent with UTI.  Labs show creatinine has increased from 1.6-2.6 potassium 2.7 LFTs shows AST 93 bilirubin 1.5 WBC 16.6 lactic acid 1.4 hemoglobin is at baseline.  Patient started on empiric antibiotics for UTI, IV fluids for acute renal failure admitted for further management.     Today, pt denies any new complaints. Noted to have short runs of SVT (completely asymptomatic)    Assessment/Plan: Active Problems:   Essential hypertension   PROSTATE CANCER, HX OF   History of CVA (cerebrovascular accident)   Vascular dementia ([facility])   AKI (acute kidney injury) ([place])   HLD (hyperlipidemia)   Hypokalemia   Nausea & vomiting   ARF (acute renal failure) ([facility])   Pressure injury of skin  SIRS likely from UTI Currently afebrile, with leukocytosis UA with large leukocytes, rare bacteria, greater than 50 WBC UC growing greater than 100,000 MSSA BC x2 NGTD Procalcitonin 1.89-->1.41-->0.84 Switch from IV ceftriaxone to PO keflex for a total of 7 days Monitor closely  AKI on CKD stage III Improving Last  creatinine on 01/2019 was 1.69, on presentation 2.69 Likely 2/2 poor oral intake in addition to vomiting + on ARB + HCTZ S/P IV fluids Continue to hold ARB, HCTZ Daily CMP  Anemia of CKD/history of iron deficiency Creatinine baseline between 8-9, trending downwards, now 7 No obvious signs of bleeding FOBT pending Anemia panel: Iron 18, sats 14, folate 5.5, ferritin 1,212, Vit B 12: 987 Give 1 dose of feraheme, transfuse 1U of PRBC on 05/13/19 Continue oral iron supplementation Daily CBC  Mild transaminitis AST 93, T bili 1.5, improving Daily CMP, will trend  Hypertension Continue amlodipine, hydralazine, IV hydralazine as needed Continue to hold ARB, HCTZ  History of CVA Continue statins, aspirin  Depression Continue Depakote, Paxil  History of dementia Continue Aricept         Malnutrition Type:      Malnutrition Characteristics:      Nutrition Interventions:       Estimated body mass index is 22 kg/m as calculated from the following:   Height as of 01/07/19: 6\' 4"  (1.93 m).   Weight as of this encounter: 82 kg.     Code Status: Full  Family Communication: None at bedside  Disposition Plan: SNF, patient will require SNF to enable a safe discharge.   Consultants:  None  Procedures:  None  Antimicrobials: Keflex  DVT prophylaxis: Heparin [place]   Objective: Vitals:   05/13/19 1258 05/13/19 1313 05/13/19 1316 05/13/19 1427  BP: 140/68 (!) 144/73 (!) 144/73 134/81  Pulse: 95 88 88 82  Resp:  20 20 20 16   Temp: 99.7 F (37.6 C) 99.3 F (37.4 C) 99.3 F (37.4 C) 99.7 F (37.6 C)  TempSrc: Oral  Oral Oral  SpO2: 98%  100% 98%  Weight:        Intake/Output Summary (Last 24 hours) at 05/13/2019 1531 Last data filed at 05/13/2019 0525 Gross per 24 hour  Intake 480 ml  Output 1000 ml  Net -520 ml   Filed Weights   05/12/19 0520 05/13/19 0523  Weight: 82.6 kg 82 kg    Exam:  General: NAD   Cardiovascular: S1, S2  present  Respiratory: CTAB  Abdomen: Soft, nontender, nondistended, bowel sounds present  Musculoskeletal: No bilateral pedal edema noted  Skin: Normal  Psychiatry: Normal mood   Data Reviewed: CBC: Recent Labs  Lab 05/11/19 0237 05/11/19 0829 05/12/19 0510 05/13/19 0451  WBC 16.6* 17.8* 12.4* 11.8*  NEUTROABS 13.7* 15.2* 9.2* 8.6*  HGB 9.3* 8.7* 7.1* 7.0*  HCT 29.4* 27.9* 22.6* 22.7*  MCV 81.7 82.1 82.2 82.5  PLT 333 292 266 [id number]   Basic Metabolic Panel: Recent Labs  Lab 05/11/19 0237 05/12/19 0510 05/13/19 0451  NA 134* 131* 131*  K 2.7* 3.3* 3.9  CL 97* 103 104  CO2 20* 18* 17*  GLUCOSE 124* 103* 101*  BUN 84* 67* 55*  CREATININE 2.69* 2.01* 1.80*  CALCIUM 8.7* 7.7* 7.8*  MG  --  2.1  --    GFR: Estimated Creatinine Clearance: 33.5 mL/min (A) (by C-G formula based on SCr of 1.8 mg/dL (H)). Liver Function Tests: Recent Labs  Lab 05/11/19 0237 05/12/19 0510 05/13/19 0451  AST 93* 94* 76*  ALT 34 38 39  ALKPHOS 84 78 84  BILITOT 1.5* 0.3 0.3  PROT 8.0 6.1* 6.1*  ALBUMIN 2.0* 1.6* 1.6*   Recent Labs  Lab 05/11/19 0237  LIPASE 32   No results for input(s): AMMONIA in the last 168 hours. Coagulation Profile: No results for input(s): INR, PROTIME in the last 168 hours. Cardiac Enzymes: No results for input(s): CKTOTAL, CKMB, CKMBINDEX, TROPONINI in the last 168 hours. BNP (last 3 results) No results for input(s): PROBNP in the last 8760 hours. HbA1C: No results for input(s): HGBA1C in the last 72 hours. CBG: No results for input(s): GLUCAP in the last 168 hours. Lipid Profile: No results for input(s): CHOL, HDL, LDLCALC, TRIG, CHOLHDL, LDLDIRECT in the last 72 hours. Thyroid Function Tests: No results for input(s): TSH, T4TOTAL, FREET4, T3FREE, THYROIDAB in the last 72 hours. Anemia Panel: Recent Labs    05/13/19 0835  VITAMINB12 987*  FOLATE 5.5*  FERRITIN 1,212*  TIBC 133*  IRON 18*   Urine analysis:    Component Value Date/Time    COLORURINE YELLOW 05/11/2019 0237   APPEARANCEUR HAZY (A) 05/11/2019 0237   LABSPEC 1.013 05/11/2019 0237   PHURINE 5.0 05/11/2019 0237   GLUCOSEU NEGATIVE 05/11/2019 0237   HGBUR MODERATE (A) 05/11/2019 0237   BILIRUBINUR NEGATIVE 05/11/2019 0237   KETONESUR NEGATIVE 05/11/2019 0237   PROTEINUR 100 (A) 05/11/2019 0237   UROBILINOGEN 0.2 12/21/2014 1409   NITRITE NEGATIVE 05/11/2019 0237   LEUKOCYTESUR LARGE (A) 05/11/2019 0237   Sepsis Labs: @LABRCNTIP (procalcitonin:4,lacticidven:4)  ) Recent Results (from the past 240 hour(s))  Respiratory Panel by RT PCR (Flu A&B, Covid) - Nasopharyngeal Swab     Status: None   Collection Time: 05/11/19  2:37 AM   Specimen: Nasopharyngeal Swab  Result Value Ref Range Status   SARS Coronavirus 2 by RT PCR NEGATIVE NEGATIVE  Final    Comment: (NOTE) SARS-CoV-2 target nucleic acids are NOT DETECTED. The SARS-CoV-2 RNA is generally detectable in upper respiratoy specimens during the acute phase of infection. The lowest concentration of SARS-CoV-2 viral copies this assay can detect is 131 copies/mL. A negative result does not preclude SARS-Cov-2 infection and should not be used as the sole basis for treatment or other patient management decisions. A negative result may occur with  improper specimen collection/handling, submission of specimen other than nasopharyngeal swab, presence of viral mutation(s) within the areas targeted by this assay, and inadequate number of viral copies (<131 copies/mL). A negative result must be combined with clinical observations, patient history, and epidemiological information. The expected result is Negative. Fact Sheet for Patients:  PinkCheek.be Fact Sheet for Healthcare Providers:  GravelBags.it This test is not yet ap proved or cleared by the [place] FDA and  has been authorized for detection and/or diagnosis of SARS-CoV-2 by FDA under an  Emergency Use Authorization (EUA). This EUA will remain  in effect (meaning this test can be used) for the duration of the COVID-19 declaration under Section 564(b)(1) of the Act, 21 U.S.C. section 360bbb-3(b)(1), unless the authorization is terminated or revoked sooner.    Influenza A by PCR NEGATIVE NEGATIVE Final   Influenza B by PCR NEGATIVE NEGATIVE Final    Comment: (NOTE) The Xpert Xpress SARS-CoV-2/FLU/RSV assay is intended as an aid in  the diagnosis of influenza from Nasopharyngeal swab specimens and  should not be used as a sole basis for treatment. Nasal washings and  aspirates are unacceptable for Xpert Xpress SARS-CoV-2/FLU/RSV  testing. Fact Sheet for Patients: PinkCheek.be Fact Sheet for Healthcare Providers: GravelBags.it This test is not yet approved or cleared by the [place] FDA and  has been authorized for detection and/or diagnosis of SARS-CoV-2 by  FDA under an Emergency Use Authorization (EUA). This EUA will remain  in effect (meaning this test can be used) for the duration of the  Covid-19 declaration under Section 564(b)(1) of the Act, 21  U.S.C. section 360bbb-3(b)(1), unless the authorization is  terminated or revoked. Performed at [facility], [place] [address] [place], [place] [address]   Urine culture     Status: Abnormal   Collection Time: 05/11/19  2:37 AM   Specimen: In/Out Cath Urine  Result Value Ref Range Status   Specimen Description   Final    IN/OUT CATH URINE Performed at [facility] [address], [place], [place] [address]    Special Requests   Final    NONE Performed at [facility], [place] [address] [place], [place] [address]    Culture >=100,000 COLONIES/mL STAPHYLOCOCCUS AUREUS (A)  Final   Report Status 05/13/2019 FINAL  Final   Organism ID, Bacteria STAPHYLOCOCCUS AUREUS (A)  Final      Susceptibility    Staphylococcus aureus - MIC*    CIPROFLOXACIN >=8 RESISTANT Resistant     GENTAMICIN <=0.5 SENSITIVE Sensitive     NITROFURANTOIN <=16 SENSITIVE Sensitive     OXACILLIN <=0.25 SENSITIVE Sensitive     TETRACYCLINE <=1 SENSITIVE Sensitive     VANCOMYCIN <=0.5 SENSITIVE Sensitive     TRIMETH/SULFA <=10 SENSITIVE Sensitive     CLINDAMYCIN <=0.25 SENSITIVE Sensitive     RIFAMPIN <=0.5 SENSITIVE Sensitive     Inducible Clindamycin NEGATIVE Sensitive     * >=100,000 COLONIES/mL STAPHYLOCOCCUS AUREUS  Culture, blood (routine x 2)     Status: None (Preliminary result)  Collection Time: 05/11/19  4:10 PM   Specimen: BLOOD LEFT ARM  Result Value Ref Range Status   Specimen Description   Final    BLOOD LEFT ARM Performed at [facility], [place] [address], [place], [place] [address]    Special Requests   Final    BOTTLES DRAWN AEROBIC AND ANAEROBIC Blood Culture adequate volume Performed at [facility] [address], [place], [place] [address]    Culture   Final    NO GROWTH 2 DAYS Performed at [facility] [address] [place], [place] [address]    Report Status PENDING  Incomplete  Culture, blood (routine x 2)     Status: None (Preliminary result)   Collection Time: 05/11/19  4:10 PM   Specimen: BLOOD LEFT HAND  Result Value Ref Range Status   Specimen Description   Final    BLOOD LEFT HAND Performed at [facility] [address], [place], [place] [address]    Special Requests   Final    BOTTLES DRAWN AEROBIC ONLY Blood Culture adequate volume Performed at [facility] [address] [place], [place] [address]    Culture   Final    NO GROWTH 2 DAYS Performed at [facility] [address] [place], [place] [address]    Report Status PENDING  Incomplete  MRSA PCR Screening     Status: None   Collection Time: 05/13/19  6:23 AM   Specimen: Nasopharyngeal  Result Value Ref Range Status    MRSA by PCR NEGATIVE NEGATIVE Final    Comment:        The GeneXpert MRSA Assay (FDA approved for NASAL specimens only), is one component of a comprehensive MRSA colonization surveillance program. It is not intended to diagnose MRSA infection nor to guide or monitor treatment for MRSA infections. Performed at [facility], [place] [address], [place], [place] [address]       Studies: No results found.  Scheduled Meds: . allopurinol  100 mg Oral Daily  . amLODipine  5 mg Oral Daily  . cephALEXin  500 mg Oral QID  . divalproex  250 mg Oral Daily  . divalproex  500 mg Oral QHS  . docusate sodium  100 mg Oral Daily  . donepezil  10 mg Oral QHS  . ferrous sulfate  325 mg Oral BID  . heparin  5,000 Units Subcutaneous Q8H  . hydrALAZINE  25 mg Oral Q8H  . oxybutynin  5 mg Oral TID  . PARoxetine  20 mg Oral Daily  . polyvinyl alcohol  2 drop Both Eyes BID  . pravastatin  40 mg Oral QHS    Continuous Infusions:    LOS: 1 day     Alma Friendly, MD [facility]  If 7PM-7AM, please contact night-coverage [email or web address] 05/13/2019, 3:31 PM

## 2019-05-13 NOTE — Progress Notes (Signed)
Tele called and stated that pt HR was 156 w/ short burst of SVT. MD notified by Shea Evans.

## 2019-05-14 LAB — CBC WITH DIFFERENTIAL/PLATELET
Abs Immature Granulocytes: 0.42 10*3/uL — ABNORMAL HIGH (ref 0.00–0.07)
Basophils Absolute: 0.1 10*3/uL (ref 0.0–0.1)
Basophils Relative: 1 %
Eosinophils Absolute: 0.2 10*3/uL (ref 0.0–0.5)
Eosinophils Relative: 2 %
HCT: 29.2 % — ABNORMAL LOW (ref 39.0–52.0)
Hemoglobin: 9.2 g/dL — ABNORMAL LOW (ref 13.0–17.0)
Immature Granulocytes: 4 %
Lymphocytes Relative: 14 %
Lymphs Abs: 1.5 10*3/uL (ref 0.7–4.0)
MCH: 25.7 pg — ABNORMAL LOW (ref 26.0–34.0)
MCHC: 31.5 g/dL (ref 30.0–36.0)
MCV: 81.6 fL (ref 80.0–100.0)
Monocytes Absolute: 0.6 10*3/uL (ref 0.1–1.0)
Monocytes Relative: 6 %
Neutro Abs: 8.1 10*3/uL — ABNORMAL HIGH (ref 1.7–7.7)
Neutrophils Relative %: 73 %
Platelets: 315 10*3/uL (ref 150–400)
RBC: 3.58 MIL/uL — ABNORMAL LOW (ref 4.22–5.81)
RDW: 17.2 % — ABNORMAL HIGH (ref 11.5–15.5)
WBC: 11 10*3/uL — ABNORMAL HIGH (ref 4.0–10.5)
nRBC: 0 % (ref 0.0–0.2)

## 2019-05-14 LAB — COMPREHENSIVE METABOLIC PANEL
ALT: 33 U/L (ref 0–44)
AST: 49 U/L — ABNORMAL HIGH (ref 15–41)
Albumin: 1.8 g/dL — ABNORMAL LOW (ref 3.5–5.0)
Alkaline Phosphatase: 76 U/L (ref 38–126)
Anion gap: 10 (ref 5–15)
BUN: 41 mg/dL — ABNORMAL HIGH (ref 8–23)
CO2: 20 mmol/L — ABNORMAL LOW (ref 22–32)
Calcium: 8.4 mg/dL — ABNORMAL LOW (ref 8.9–10.3)
Chloride: 106 mmol/L (ref 98–111)
Creatinine, Ser: 1.4 mg/dL — ABNORMAL HIGH (ref 0.61–1.24)
GFR calc Af Amer: 52 mL/min — ABNORMAL LOW (ref >60)
GFR calc non Af Amer: 45 mL/min — ABNORMAL LOW (ref >60)
Glucose, Bld: 97 mg/dL (ref 70–99)
Potassium: 3.8 mmol/L (ref 3.5–5.1)
Sodium: 136 mmol/L (ref 135–145)
Total Bilirubin: 1 mg/dL (ref 0.3–1.2)
Total Protein: 6.7 g/dL (ref 6.5–8.1)

## 2019-05-14 MED ORDER — FOLIC ACID 1 MG PO TABS
1.0000 mg | ORAL_TABLET | Freq: Every day | ORAL | Status: DC
  Administered 2019-05-14 – 2019-05-17 (×4): 1 mg via ORAL
  Filled 2019-05-14 (×4): qty 1

## 2019-05-14 NOTE — Progress Notes (Signed)
PROGRESS NOTE  Manuel King VVO:[id number] DOB: 11/06/1929 DOA: 05/11/2019 PCP: Merrilee Seashore, MD  HPI/Recap of past 24 hours: HPI from Dr [name] is a 86 y.o. male with history of stroke, dementia, hypertension, prostate cancer who was recently diagnosed with COVID-19 infection on December 15 about 3 weeks ago at the time patient was asymptomatic started having fever and chills and had 1 episode of nausea vomiting.  Patient was brought to the ER.  Patient has history of dementia but is giving appropriate history and states that he threw up once denies any abdominal pain denies any chest pain or shortness of breath.  Has been having some pain in the right knee and has a small wound in the right knee.  Patient states he usually walks with the help of walker. In the ER, afebrile, tachycardic COVID-19 test was negative, chest x-ray unremarkable.  UA shows features consistent with UTI.  Labs show creatinine has increased from 1.6-2.6 potassium 2.7 LFTs shows AST 93 bilirubin 1.5 WBC 16.6 lactic acid 1.4 hemoglobin is at baseline.  Patient started on empiric antibiotics for UTI, IV fluids for acute renal failure admitted for further management.     Today, met patient sleeping in bed, easily arousable, denies any new complaints.    Assessment/Plan: Active Problems:   Essential hypertension   PROSTATE CANCER, HX OF   History of CVA (cerebrovascular accident)   Vascular dementia (HCC)   AKI (acute kidney injury) ([place])   HLD (hyperlipidemia)   Hypokalemia   Nausea & vomiting   ARF (acute renal failure) (HCC)   Pressure injury of skin  SIRS likely from MSSA UTI Currently afebrile, with resolving leukocytosis UA with large leukocytes, rare bacteria, greater than 50 WBC UC growing greater than 100,000 MSSA BC x2 NGTD Procalcitonin 1.89-->1.41-->0.84 Continue PO keflex for a total of 7 days Monitor closely  AKI on CKD stage III Improved Last creatinine on 01/2019  was 1.69, on presentation 2.69 Likely 2/2 poor oral intake in addition to vomiting + on ARB + HCTZ S/P IV fluids Continue to hold ARB, HCTZ Daily CMP  Anemia of CKD/history of iron deficiency Creatinine baseline between 8-9, stable post 1U of PRBC No obvious signs of bleeding FOBT never collected Anemia panel: Iron 18, sats 14, folate 5.5, ferritin 1,212, Vit B 12: 987 Gave 1 dose of feraheme, and transfused 1U of PRBC on 05/13/19 Continue oral iron supplementation Daily CBC  Mild transaminitis Resolving AST 93, T bili 1.5 Daily CMP, will trend  Hypertension Continue amlodipine, hydralazine, IV hydralazine as needed Continue to hold ARB, HCTZ  History of CVA Continue statins, aspirin  Depression Continue Depakote, Paxil  History of dementia Continue Aricept         Malnutrition Type:      Malnutrition Characteristics:      Nutrition Interventions:       Estimated body mass index is 26.03 kg/m as calculated from the following:   Height as of 01/07/19: '6\' 4"'  (1.93 m).   Weight as of this encounter: 97 kg.     Code Status: Full  Family Communication: None at bedside  Disposition Plan: SNF, patient will require SNF to enable a safe discharge.   Consultants:  None  Procedures:  None  Antimicrobials: Keflex  DVT prophylaxis: Heparin [place]   Objective: Vitals:   05/13/19 2001 05/14/19 0012 05/14/19 0415 05/14/19 0434  BP: (!) 157/78 (!) 159/68 (!) 141/77   Pulse: 81 79 78   Resp: 20  19 19   Temp: 98.2 F (36.8 C) 97.7 F (36.5 C) 97.7 F (36.5 C)   TempSrc: Oral Oral Oral   SpO2: 97% 100% 98%   Weight:    97 kg    Intake/Output Summary (Last 24 hours) at 05/14/2019 1333 Last data filed at 05/14/2019 0934 Gross per 24 hour  Intake 2235.07 ml  Output 1325 ml  Net 910.07 ml   Filed Weights   05/12/19 0520 05/13/19 0523 05/14/19 0434  Weight: 82.6 kg 82 kg 97 kg    Exam:  General: NAD   Cardiovascular: S1, S2  present  Respiratory: CTAB  Abdomen: Soft, nontender, nondistended, bowel sounds present  Musculoskeletal: No bilateral pedal edema noted  Skin: Normal  Psychiatry: Normal mood   Data Reviewed: CBC: Recent Labs  Lab 05/11/19 0237 05/11/19 0829 05/12/19 0510 05/13/19 0451 05/13/19 1635 05/14/19 0549  WBC 16.6* 17.8* 12.4* 11.8*  --  11.0*  NEUTROABS 13.7* 15.2* 9.2* 8.6*  --  8.1*  HGB 9.3* 8.7* 7.1* 7.0* 7.8* 9.2*  HCT 29.4* 27.9* 22.6* 22.7* 24.8* 29.2*  MCV 81.7 82.1 82.2 82.5  --  81.6  PLT 333 292 266 269  --  818   Basic Metabolic Panel: Recent Labs  Lab 05/11/19 0237 05/12/19 0510 05/13/19 0451 05/14/19 0549  NA 134* 131* 131* 136  K 2.7* 3.3* 3.9 3.8  CL 97* 103 104 106  CO2 20* 18* 17* 20*  GLUCOSE 124* 103* 101* 97  BUN 84* 67* 55* 41*  CREATININE 2.69* 2.01* 1.80* 1.40*  CALCIUM 8.7* 7.7* 7.8* 8.4*  MG  --  2.1  --   --    GFR: Estimated Creatinine Clearance: 45.6 mL/min (A) (by C-G formula based on SCr of 1.4 mg/dL (H)). Liver Function Tests: Recent Labs  Lab 05/11/19 0237 05/12/19 0510 05/13/19 0451 05/14/19 0549  AST 93* 94* 76* 49*  ALT 34 38 39 33  ALKPHOS 84 78 84 76  BILITOT 1.5* 0.3 0.3 1.0  PROT 8.0 6.1* 6.1* 6.7  ALBUMIN 2.0* 1.6* 1.6* 1.8*   Recent Labs  Lab 05/11/19 0237  LIPASE 32   No results for input(s): AMMONIA in the last 168 hours. Coagulation Profile: No results for input(s): INR, PROTIME in the last 168 hours. Cardiac Enzymes: No results for input(s): CKTOTAL, CKMB, CKMBINDEX, TROPONINI in the last 168 hours. BNP (last 3 results) No results for input(s): PROBNP in the last 8760 hours. HbA1C: No results for input(s): HGBA1C in the last 72 hours. CBG: No results for input(s): GLUCAP in the last 168 hours. Lipid Profile: No results for input(s): CHOL, HDL, LDLCALC, TRIG, CHOLHDL, LDLDIRECT in the last 72 hours. Thyroid Function Tests: No results for input(s): TSH, T4TOTAL, FREET4, T3FREE, THYROIDAB in the  last 72 hours. Anemia Panel: Recent Labs    05/13/19 0835  VITAMINB12 987*  FOLATE 5.5*  FERRITIN 1,212*  TIBC 133*  IRON 18*   Urine analysis:    Component Value Date/Time   COLORURINE YELLOW 05/11/2019 0237   APPEARANCEUR HAZY (A) 05/11/2019 0237   LABSPEC 1.013 05/11/2019 0237   PHURINE 5.0 05/11/2019 0237   GLUCOSEU NEGATIVE 05/11/2019 0237   HGBUR MODERATE (A) 05/11/2019 0237   BILIRUBINUR NEGATIVE 05/11/2019 0237   KETONESUR NEGATIVE 05/11/2019 0237   PROTEINUR 100 (A) 05/11/2019 0237   UROBILINOGEN 0.2 12/21/2014 1409   NITRITE NEGATIVE 05/11/2019 0237   LEUKOCYTESUR LARGE (A) 05/11/2019 0237   Sepsis Labs: '@LABRCNTIP' (procalcitonin:4,lacticidven:4)  ) Recent Results (from the past 240 hour(s))  Respiratory Panel by RT PCR (Flu A&B, Covid) - Nasopharyngeal Swab     Status: None   Collection Time: 05/11/19  2:37 AM   Specimen: Nasopharyngeal Swab  Result Value Ref Range Status   SARS Coronavirus 2 by RT PCR NEGATIVE NEGATIVE Final    Comment: (NOTE) SARS-CoV-2 target nucleic acids are NOT DETECTED. The SARS-CoV-2 RNA is generally detectable in upper respiratoy specimens during the acute phase of infection. The lowest concentration of SARS-CoV-2 viral copies this assay can detect is 131 copies/mL. A negative result does not preclude SARS-Cov-2 infection and should not be used as the sole basis for treatment or other patient management decisions. A negative result may occur with  improper specimen collection/handling, submission of specimen other than nasopharyngeal swab, presence of viral mutation(s) within the areas targeted by this assay, and inadequate number of viral copies (<131 copies/mL). A negative result must be combined with clinical observations, patient history, and epidemiological information. The expected result is Negative. Fact Sheet for Patients:  PinkCheek.be Fact Sheet for Healthcare Providers:   GravelBags.it This test is not yet ap proved or cleared by the [place] FDA and  has been authorized for detection and/or diagnosis of SARS-CoV-2 by FDA under an Emergency Use Authorization (EUA). This EUA will remain  in effect (meaning this test can be used) for the duration of the COVID-19 declaration under Section 564(b)(1) of the Act, 21 U.S.C. section 360bbb-3(b)(1), unless the authorization is terminated or revoked sooner.    Influenza A by PCR NEGATIVE NEGATIVE Final   Influenza B by PCR NEGATIVE NEGATIVE Final    Comment: (NOTE) The Xpert Xpress SARS-CoV-2/FLU/RSV assay is intended as an aid in  the diagnosis of influenza from Nasopharyngeal swab specimens and  should not be used as a sole basis for treatment. Nasal washings and  aspirates are unacceptable for Xpert Xpress SARS-CoV-2/FLU/RSV  testing. Fact Sheet for Patients: PinkCheek.be Fact Sheet for Healthcare Providers: GravelBags.it This test is not yet approved or cleared by the [place] FDA and  has been authorized for detection and/or diagnosis of SARS-CoV-2 by  FDA under an Emergency Use Authorization (EUA). This EUA will remain  in effect (meaning this test can be used) for the duration of the  Covid-19 declaration under Section 564(b)(1) of the Act, 21  U.S.C. section 360bbb-3(b)(1), unless the authorization is  terminated or revoked. Performed at [facility], [place] [address], [place], [place] [address]   Urine culture     Status: Abnormal   Collection Time: 05/11/19  2:37 AM   Specimen: In/Out Cath Urine  Result Value Ref Range Status   Specimen Description   Final    IN/OUT CATH URINE Performed at [facility] [address], [place], [place] [address]    Special Requests   Final    NONE Performed at [facility], [place] [address], [place], [place]  [address]    Culture >=100,000 COLONIES/mL STAPHYLOCOCCUS AUREUS (A)  Final   Report Status 05/13/2019 FINAL  Final   Organism ID, Bacteria STAPHYLOCOCCUS AUREUS (A)  Final      Susceptibility   Staphylococcus aureus - MIC*    CIPROFLOXACIN >=8 RESISTANT Resistant     GENTAMICIN <=0.5 SENSITIVE Sensitive     NITROFURANTOIN <=16 SENSITIVE Sensitive     OXACILLIN <=0.25 SENSITIVE Sensitive     TETRACYCLINE <=1 SENSITIVE Sensitive     VANCOMYCIN <=0.5 SENSITIVE Sensitive     TRIMETH/SULFA <=10 SENSITIVE Sensitive  CLINDAMYCIN <=0.25 SENSITIVE Sensitive     RIFAMPIN <=0.5 SENSITIVE Sensitive     Inducible Clindamycin NEGATIVE Sensitive     * >=100,000 COLONIES/mL STAPHYLOCOCCUS AUREUS  Culture, blood (routine x 2)     Status: None (Preliminary result)   Collection Time: 05/11/19  4:10 PM   Specimen: BLOOD LEFT ARM  Result Value Ref Range Status   Specimen Description   Final    BLOOD LEFT ARM Performed at [facility] [address], [place], [place] [address]    Special Requests   Final    BOTTLES DRAWN AEROBIC AND ANAEROBIC Blood Culture adequate volume Performed at [facility] [address], [place], [place] [address]    Culture   Final    NO GROWTH 3 DAYS Performed at [facility], [place] [address] [place], [place] [address]    Report Status PENDING  Incomplete  Culture, blood (routine x 2)     Status: None (Preliminary result)   Collection Time: 05/11/19  4:10 PM   Specimen: BLOOD LEFT HAND  Result Value Ref Range Status   Specimen Description   Final    BLOOD LEFT HAND Performed at [facility] [address], [place], [place] [address]    Special Requests   Final    BOTTLES DRAWN AEROBIC ONLY Blood Culture adequate volume Performed at [facility] [address], [place], [place] [address]    Culture   Final    NO GROWTH 3 DAYS Performed at [facility], [place] [address] [place], [place] [address]    Report Status PENDING  Incomplete  MRSA PCR Screening     Status: None   Collection Time: 05/13/19  6:23 AM   Specimen: Nasopharyngeal  Result Value Ref Range Status   MRSA by PCR NEGATIVE NEGATIVE Final    Comment:        The GeneXpert MRSA Assay (FDA approved for NASAL specimens only), is one component of a comprehensive MRSA colonization surveillance program. It is not intended to diagnose MRSA infection nor to guide or monitor treatment for MRSA infections. Performed at [facility], [place] [address], [place], [place] [address]       Studies: No results found.  Scheduled Meds: . allopurinol  100 mg Oral Daily  . amLODipine  5 mg Oral Daily  . cephALEXin  500 mg Oral QID  . divalproex  250 mg Oral Daily  . divalproex  500 mg Oral QHS  . docusate sodium  100 mg Oral Daily  . donepezil  10 mg Oral QHS  . ferrous sulfate  325 mg Oral BID  . folic acid  1 mg Oral Daily  . heparin  5,000 Units Subcutaneous Q8H  . hydrALAZINE  25 mg Oral Q8H  . oxybutynin  5 mg Oral TID  . PARoxetine  20 mg Oral Daily  . polyvinyl alcohol  2 drop Both Eyes BID  . pravastatin  40 mg Oral QHS    Continuous Infusions:    LOS: 2 days     Alma Friendly, MD [facility]  If 7PM-7AM, please contact night-coverage [email or web address] 05/14/2019, 1:33 PM

## 2019-05-15 ENCOUNTER — Inpatient Hospital Stay (HOSPITAL_COMMUNITY): Payer: Medicare Other

## 2019-05-15 LAB — CBC WITH DIFFERENTIAL/PLATELET
Abs Immature Granulocytes: 0.39 10*3/uL — ABNORMAL HIGH (ref 0.00–0.07)
Basophils Absolute: 0 10*3/uL (ref 0.0–0.1)
Basophils Relative: 0 %
Eosinophils Absolute: 0.3 10*3/uL (ref 0.0–0.5)
Eosinophils Relative: 2 %
HCT: 29.7 % — ABNORMAL LOW (ref 39.0–52.0)
Hemoglobin: 9.3 g/dL — ABNORMAL LOW (ref 13.0–17.0)
Immature Granulocytes: 3 %
Lymphocytes Relative: 14 %
Lymphs Abs: 1.8 10*3/uL (ref 0.7–4.0)
MCH: 25.8 pg — ABNORMAL LOW (ref 26.0–34.0)
MCHC: 31.3 g/dL (ref 30.0–36.0)
MCV: 82.5 fL (ref 80.0–100.0)
Monocytes Absolute: 0.8 10*3/uL (ref 0.1–1.0)
Monocytes Relative: 6 %
Neutro Abs: 10 10*3/uL — ABNORMAL HIGH (ref 1.7–7.7)
Neutrophils Relative %: 75 %
Platelets: 335 10*3/uL (ref 150–400)
RBC: 3.6 MIL/uL — ABNORMAL LOW (ref 4.22–5.81)
RDW: 17.8 % — ABNORMAL HIGH (ref 11.5–15.5)
WBC: 13.4 10*3/uL — ABNORMAL HIGH (ref 4.0–10.5)
nRBC: 0 % (ref 0.0–0.2)

## 2019-05-15 LAB — COMPREHENSIVE METABOLIC PANEL
ALT: 32 U/L (ref 0–44)
AST: 44 U/L — ABNORMAL HIGH (ref 15–41)
Albumin: 1.9 g/dL — ABNORMAL LOW (ref 3.5–5.0)
Alkaline Phosphatase: 81 U/L (ref 38–126)
Anion gap: 10 (ref 5–15)
BUN: 35 mg/dL — ABNORMAL HIGH (ref 8–23)
CO2: 21 mmol/L — ABNORMAL LOW (ref 22–32)
Calcium: 8.3 mg/dL — ABNORMAL LOW (ref 8.9–10.3)
Chloride: 105 mmol/L (ref 98–111)
Creatinine, Ser: 1.36 mg/dL — ABNORMAL HIGH (ref 0.61–1.24)
GFR calc Af Amer: 54 mL/min — ABNORMAL LOW (ref >60)
GFR calc non Af Amer: 46 mL/min — ABNORMAL LOW (ref >60)
Glucose, Bld: 100 mg/dL — ABNORMAL HIGH (ref 70–99)
Potassium: 3.7 mmol/L (ref 3.5–5.1)
Sodium: 136 mmol/L (ref 135–145)
Total Bilirubin: 0.7 mg/dL (ref 0.3–1.2)
Total Protein: 6.9 g/dL (ref 6.5–8.1)

## 2019-05-15 MED ORDER — SENNOSIDES-DOCUSATE SODIUM 8.6-50 MG PO TABS
1.0000 | ORAL_TABLET | Freq: Two times a day (BID) | ORAL | Status: DC
  Administered 2019-05-15 – 2019-05-17 (×5): 1 via ORAL
  Filled 2019-05-15 (×5): qty 1

## 2019-05-15 MED ORDER — POLYETHYLENE GLYCOL 3350 17 G PO PACK
17.0000 g | PACK | Freq: Every day | ORAL | Status: DC
  Administered 2019-05-15 – 2019-05-17 (×3): 17 g via ORAL
  Filled 2019-05-15 (×3): qty 1

## 2019-05-15 NOTE — Progress Notes (Signed)
Alert MD via text that pt spit up a small amount of blood. Pt frequently spits up tan thick mucous into tissues. Pt states first time blood noted. Will continue to monitor. No other signs of bleeding noted.

## 2019-05-15 NOTE — Progress Notes (Signed)
PROGRESS NOTE  Manuel King [id number] DOB: 04/19/1930 DOA: 05/11/2019 PCP: Merrilee Seashore, MD  HPI/Recap of past 24 hours: HPI from Dr [name] is a 86 y.o. male with history of stroke, dementia, hypertension, prostate cancer who was recently diagnosed with COVID-19 infection on December 15 about 3 weeks ago at the time patient was asymptomatic started having fever and chills and had 1 episode of nausea vomiting.  Patient was brought to the ER.  Patient has history of dementia but is giving appropriate history and states that he threw up once denies any abdominal pain denies any chest pain or shortness of breath.  Has been having some pain in the right knee and has a small wound in the right knee.  Patient states he usually walks with the help of walker. In the ER, afebrile, tachycardic COVID-19 test was negative, chest x-ray unremarkable.  UA shows features consistent with UTI. Labs show creatinine has increased from 1.6-2.6 potassium 2.7 LFTs shows AST 93 bilirubin 1.5 WBC 16.6 lactic acid 1.4 hemoglobin is at baseline.  Patient started on empiric antibiotics for UTI, IV fluids for acute renal failure admitted for further management.     Today, patient denies any new complaints, noted to spit up a small amount of blood as per RN.  No other signs of bleeding noted.  Requesting coffee.  Denies any chest pain, shortness of breath, abdominal pain, nausea/vomiting, fever/chills.    Assessment/Plan: Active Problems:   Essential hypertension   PROSTATE CANCER, HX OF   History of CVA (cerebrovascular accident)   Vascular dementia (HCC)   AKI (acute kidney injury) ([place])   HLD (hyperlipidemia)   Hypokalemia   Nausea & vomiting   ARF (acute renal failure) (HCC)   Pressure injury of skin  SIRS likely from MSSA UTI Currently afebrile, with leukocytosis UA with large leukocytes, rare bacteria, greater than 50 WBC UC growing greater than 100,000 MSSA BC x2  NGTD Procalcitonin 1.89-->1.41-->0.84 Continue PO keflex for a total of 7 days Monitor closely  AKI on CKD stage III Improved Last creatinine on 01/2019 was 1.69, on presentation 2.69 Likely 2/2 poor oral intake in addition to vomiting + on ARB + HCTZ S/P IV fluids Continue to hold ARB, HCTZ Daily CMP  Small amount of hemoptysis Witnessed by RN Unknown etiology, ??irritation of the airway Chest x-ray pending Monitor closely, may hold Goff Heparin if persistent  Anemia of CKD/history of iron deficiency Creatinine baseline between 8-9, stable post 1U of PRBC No obvious signs of bleeding FOBT never collected Anemia panel: Iron 18, sats 14, folate 5.5, ferritin 1,212, Vit B 12: 987 Gave 1 dose of feraheme, and transfused 1U of PRBC on 05/13/19 Continue oral iron supplementation Daily CBC  Mild transaminitis Resolved AST 93, T bili 1.5 Daily CMP, will trend  Hypertension Continue amlodipine, hydralazine, IV hydralazine as needed Continue to hold ARB, HCTZ  History of CVA Continue statins  Depression Continue Depakote, Paxil  History of dementia Continue Aricept         Malnutrition Type:      Malnutrition Characteristics:      Nutrition Interventions:       Estimated body mass index is 25.76 kg/m as calculated from the following:   Height as of 01/07/19: 6\' 4"  (1.93 m).   Weight as of this encounter: 96 kg.     Code Status: Full  Family Communication: None at bedside  Disposition Plan: SNF, patient will require SNF to enable a safe  discharge.   Consultants:  None  Procedures:  None  Antimicrobials: Keflex  DVT prophylaxis: Heparin [place]   Objective: Vitals:   05/14/19 0434 05/14/19 1442 05/14/19 2049 05/15/19 0531  BP:  139/71 (!) 145/66 (!) 151/82  Pulse:  79 86 71  Resp:  20 20 20   Temp:  98 F (36.7 C) 98.5 F (36.9 C) 97.6 F (36.4 C)  TempSrc:  Oral Oral Oral  SpO2:  98% 98% 100%  Weight: 97 kg   96 kg     Intake/Output Summary (Last 24 hours) at 05/15/2019 1211 Last data filed at 05/15/2019 0915 Gross per 24 hour  Intake 1544 ml  Output 875 ml  Net 669 ml   Filed Weights   05/13/19 0523 05/14/19 0434 05/15/19 0531  Weight: 82 kg 97 kg 96 kg    Exam:  General: NAD   Cardiovascular: S1, S2 present  Respiratory: CTAB  Abdomen: Soft, nontender, nondistended, bowel sounds present  Musculoskeletal: No bilateral pedal edema noted  Skin: Normal  Psychiatry: Normal mood    Data Reviewed: CBC: Recent Labs  Lab 05/11/19 0829 05/12/19 0510 05/13/19 0451 05/13/19 1635 05/14/19 0549 05/15/19 0552  WBC 17.8* 12.4* 11.8*  --  11.0* 13.4*  NEUTROABS 15.2* 9.2* 8.6*  --  8.1* 10.0*  HGB 8.7* 7.1* 7.0* 7.8* 9.2* 9.3*  HCT 27.9* 22.6* 22.7* 24.8* 29.2* 29.7*  MCV 82.1 82.2 82.5  --  81.6 82.5  PLT 292 266 269  --  315 [id number]   Basic Metabolic Panel: Recent Labs  Lab 05/11/19 0237 05/12/19 0510 05/13/19 0451 05/14/19 0549 05/15/19 0552  NA 134* 131* 131* 136 136  K 2.7* 3.3* 3.9 3.8 3.7  CL 97* 103 104 106 105  CO2 20* 18* 17* 20* 21*  GLUCOSE 124* 103* 101* 97 100*  BUN 84* 67* 55* 41* 35*  CREATININE 2.69* 2.01* 1.80* 1.40* 1.36*  CALCIUM 8.7* 7.7* 7.8* 8.4* 8.3*  MG  --  2.1  --   --   --    GFR: Estimated Creatinine Clearance: 47 mL/min (A) (by C-G formula based on SCr of 1.36 mg/dL (H)). Liver Function Tests: Recent Labs  Lab 05/11/19 0237 05/12/19 0510 05/13/19 0451 05/14/19 0549 05/15/19 0552  AST 93* 94* 76* 49* 44*  ALT 34 38 39 33 32  ALKPHOS 84 78 84 76 81  BILITOT 1.5* 0.3 0.3 1.0 0.7  PROT 8.0 6.1* 6.1* 6.7 6.9  ALBUMIN 2.0* 1.6* 1.6* 1.8* 1.9*   Recent Labs  Lab 05/11/19 0237  LIPASE 32   No results for input(s): AMMONIA in the last 168 hours. Coagulation Profile: No results for input(s): INR, PROTIME in the last 168 hours. Cardiac Enzymes: No results for input(s): CKTOTAL, CKMB, CKMBINDEX, TROPONINI in the last 168 hours. BNP (last  3 results) No results for input(s): PROBNP in the last 8760 hours. HbA1C: No results for input(s): HGBA1C in the last 72 hours. CBG: No results for input(s): GLUCAP in the last 168 hours. Lipid Profile: No results for input(s): CHOL, HDL, LDLCALC, TRIG, CHOLHDL, LDLDIRECT in the last 72 hours. Thyroid Function Tests: No results for input(s): TSH, T4TOTAL, FREET4, T3FREE, THYROIDAB in the last 72 hours. Anemia Panel: Recent Labs    05/13/19 0835  VITAMINB12 987*  FOLATE 5.5*  FERRITIN 1,212*  TIBC 133*  IRON 18*   Urine analysis:    Component Value Date/Time   COLORURINE YELLOW 05/11/2019 0237   APPEARANCEUR HAZY (A) 05/11/2019 0237   LABSPEC 1.013 05/11/2019 [id number]  PHURINE 5.0 05/11/2019 0237   GLUCOSEU NEGATIVE 05/11/2019 0237   HGBUR MODERATE (A) 05/11/2019 0237   BILIRUBINUR NEGATIVE 05/11/2019 0237   KETONESUR NEGATIVE 05/11/2019 0237   PROTEINUR 100 (A) 05/11/2019 0237   UROBILINOGEN 0.2 12/21/2014 1409   NITRITE NEGATIVE 05/11/2019 0237   LEUKOCYTESUR LARGE (A) 05/11/2019 0237   Sepsis Labs: @LABRCNTIP (procalcitonin:4,lacticidven:4)  ) Recent Results (from the past 240 hour(s))  Respiratory Panel by RT PCR (Flu A&B, Covid) - Nasopharyngeal Swab     Status: None   Collection Time: 05/11/19  2:37 AM   Specimen: Nasopharyngeal Swab  Result Value Ref Range Status   SARS Coronavirus 2 by RT PCR NEGATIVE NEGATIVE Final    Comment: (NOTE) SARS-CoV-2 target nucleic acids are NOT DETECTED. The SARS-CoV-2 RNA is generally detectable in upper respiratoy specimens during the acute phase of infection. The lowest concentration of SARS-CoV-2 viral copies this assay can detect is 131 copies/mL. A negative result does not preclude SARS-Cov-2 infection and should not be used as the sole basis for treatment or other patient management decisions. A negative result may occur with  improper specimen collection/handling, submission of specimen other than nasopharyngeal swab,  presence of viral mutation(s) within the areas targeted by this assay, and inadequate number of viral copies (<131 copies/mL). A negative result must be combined with clinical observations, patient history, and epidemiological information. The expected result is Negative. Fact Sheet for Patients:  PinkCheek.be Fact Sheet for Healthcare Providers:  GravelBags.it This test is not yet ap proved or cleared by the [place] FDA and  has been authorized for detection and/or diagnosis of SARS-CoV-2 by FDA under an Emergency Use Authorization (EUA). This EUA will remain  in effect (meaning this test can be used) for the duration of the COVID-19 declaration under Section 564(b)(1) of the Act, 21 U.S.C. section 360bbb-3(b)(1), unless the authorization is terminated or revoked sooner.    Influenza A by PCR NEGATIVE NEGATIVE Final   Influenza B by PCR NEGATIVE NEGATIVE Final    Comment: (NOTE) The Xpert Xpress SARS-CoV-2/FLU/RSV assay is intended as an aid in  the diagnosis of influenza from Nasopharyngeal swab specimens and  should not be used as a sole basis for treatment. Nasal washings and  aspirates are unacceptable for Xpert Xpress SARS-CoV-2/FLU/RSV  testing. Fact Sheet for Patients: PinkCheek.be Fact Sheet for Healthcare Providers: GravelBags.it This test is not yet approved or cleared by the [place] FDA and  has been authorized for detection and/or diagnosis of SARS-CoV-2 by  FDA under an Emergency Use Authorization (EUA). This EUA will remain  in effect (meaning this test can be used) for the duration of the  Covid-19 declaration under Section 564(b)(1) of the Act, 21  U.S.C. section 360bbb-3(b)(1), unless the authorization is  terminated or revoked. Performed at [facility], [place] [address], [place], [place] [address]   Urine culture      Status: Abnormal   Collection Time: 05/11/19  2:37 AM   Specimen: In/Out Cath Urine  Result Value Ref Range Status   Specimen Description   Final    IN/OUT CATH URINE Performed at [facility] [address] [place], [place] [address]    Special Requests   Final    NONE Performed at [facility], [place] [address], [place],  [address]    Culture >=100,000 COLONIES/mL STAPHYLOCOCCUS AUREUS (A)  Final   Report Status 05/13/2019 FINAL  Final   Organism ID, Bacteria STAPHYLOCOCCUS AUREUS (A)  Final  Susceptibility   Staphylococcus aureus - MIC*    CIPROFLOXACIN >=8 RESISTANT Resistant     GENTAMICIN <=0.5 SENSITIVE Sensitive     NITROFURANTOIN <=16 SENSITIVE Sensitive     OXACILLIN <=0.25 SENSITIVE Sensitive     TETRACYCLINE <=1 SENSITIVE Sensitive     VANCOMYCIN <=0.5 SENSITIVE Sensitive     TRIMETH/SULFA <=10 SENSITIVE Sensitive     CLINDAMYCIN <=0.25 SENSITIVE Sensitive     RIFAMPIN <=0.5 SENSITIVE Sensitive     Inducible Clindamycin NEGATIVE Sensitive     * >=100,000 COLONIES/mL STAPHYLOCOCCUS AUREUS  Culture, blood (routine x 2)     Status: None (Preliminary result)   Collection Time: 05/11/19  4:10 PM   Specimen: BLOOD LEFT ARM  Result Value Ref Range Status   Specimen Description   Final    BLOOD LEFT ARM Performed at [facility] [address] [place], [place] [address]    Special Requests   Final    BOTTLES DRAWN AEROBIC AND ANAEROBIC Blood Culture adequate volume Performed at [facility] [address], [place], [place] [address]    Culture   Final    NO GROWTH 4 DAYS Performed at [facility], [place] [address], [place], [place] [address]    Report Status PENDING  Incomplete  Culture, blood (routine x 2)     Status: None (Preliminary result)   Collection Time: 05/11/19  4:10 PM   Specimen: BLOOD LEFT HAND  Result Value Ref Range Status   Specimen Description   Final     BLOOD LEFT HAND Performed at [facility] [address], [place], [place] [address]    Special Requests   Final    BOTTLES DRAWN AEROBIC ONLY Blood Culture adequate volume Performed at [facility] [address] [place] [address], [place], [place] [address]    Culture   Final    NO GROWTH 4 DAYS Performed at [facility], [place] [address], [place], [place] [address]    Report Status PENDING  Incomplete  MRSA PCR Screening     Status: None   Collection Time: 05/13/19  6:23 AM   Specimen: Nasopharyngeal  Result Value Ref Range Status   MRSA by PCR NEGATIVE NEGATIVE Final    Comment:        The GeneXpert MRSA Assay (FDA approved for NASAL specimens only), is one component of a comprehensive MRSA colonization surveillance program. It is not intended to diagnose MRSA infection nor to guide or monitor treatment for MRSA infections. Performed at [facility], [place] [address], [place], [place] [address]       Studies: No results found.  Scheduled Meds: . allopurinol  100 mg Oral Daily  . amLODipine  5 mg Oral Daily  . cephALEXin  500 mg Oral QID  . divalproex  250 mg Oral Daily  . divalproex  500 mg Oral QHS  . donepezil  10 mg Oral QHS  . ferrous sulfate  325 mg Oral BID  . folic acid  1 mg Oral Daily  . heparin  5,000 Units Subcutaneous Q8H  . hydrALAZINE  25 mg Oral Q8H  . oxybutynin  5 mg Oral TID  . PARoxetine  20 mg Oral Daily  . polyethylene glycol  17 g Oral Daily  . polyvinyl alcohol  2 drop Both Eyes BID  . pravastatin  40 mg Oral QHS  . senna-docusate  1 tablet Oral BID    Continuous Infusions:  LOS: 3 days     Alma Friendly, MD [facility]  If 7PM-7AM, please contact night-coverage [email or web address] 05/15/2019, 12:11 PM

## 2019-05-16 DIAGNOSIS — M1990 Unspecified osteoarthritis, unspecified site: Secondary | ICD-10-CM | POA: Diagnosis not present

## 2019-05-16 LAB — CBC WITH DIFFERENTIAL/PLATELET
Abs Immature Granulocytes: 0.27 10*3/uL — ABNORMAL HIGH (ref 0.00–0.07)
Basophils Absolute: 0 10*3/uL (ref 0.0–0.1)
Basophils Relative: 0 %
Eosinophils Absolute: 0.3 10*3/uL (ref 0.0–0.5)
Eosinophils Relative: 3 %
HCT: 27.1 % — ABNORMAL LOW (ref 39.0–52.0)
Hemoglobin: 8.4 g/dL — ABNORMAL LOW (ref 13.0–17.0)
Immature Granulocytes: 2 %
Lymphocytes Relative: 17 %
Lymphs Abs: 2 10*3/uL (ref 0.7–4.0)
MCH: 26 pg (ref 26.0–34.0)
MCHC: 31 g/dL (ref 30.0–36.0)
MCV: 83.9 fL (ref 80.0–100.0)
Monocytes Absolute: 0.8 10*3/uL (ref 0.1–1.0)
Monocytes Relative: 6 %
Neutro Abs: 8.6 10*3/uL — ABNORMAL HIGH (ref 1.7–7.7)
Neutrophils Relative %: 72 %
Platelets: 320 10*3/uL (ref 150–400)
RBC: 3.23 MIL/uL — ABNORMAL LOW (ref 4.22–5.81)
RDW: 18.1 % — ABNORMAL HIGH (ref 11.5–15.5)
WBC: 12.1 10*3/uL — ABNORMAL HIGH (ref 4.0–10.5)
nRBC: 0 % (ref 0.0–0.2)

## 2019-05-16 LAB — COMPREHENSIVE METABOLIC PANEL WITH GFR
ALT: 31 U/L (ref 0–44)
AST: 42 U/L — ABNORMAL HIGH (ref 15–41)
Albumin: 1.7 g/dL — ABNORMAL LOW (ref 3.5–5.0)
Alkaline Phosphatase: 69 U/L (ref 38–126)
Anion gap: 10 (ref 5–15)
BUN: 28 mg/dL — ABNORMAL HIGH (ref 8–23)
CO2: 19 mmol/L — ABNORMAL LOW (ref 22–32)
Calcium: 8.1 mg/dL — ABNORMAL LOW (ref 8.9–10.3)
Chloride: 108 mmol/L (ref 98–111)
Creatinine, Ser: 1.32 mg/dL — ABNORMAL HIGH (ref 0.61–1.24)
GFR calc Af Amer: 56 mL/min — ABNORMAL LOW
GFR calc non Af Amer: 48 mL/min — ABNORMAL LOW
Glucose, Bld: 99 mg/dL (ref 70–99)
Potassium: 4.1 mmol/L (ref 3.5–5.1)
Sodium: 137 mmol/L (ref 135–145)
Total Bilirubin: 0.3 mg/dL (ref 0.3–1.2)
Total Protein: 6.1 g/dL — ABNORMAL LOW (ref 6.5–8.1)

## 2019-05-16 LAB — CULTURE, BLOOD (ROUTINE X 2)
Culture: NO GROWTH
Culture: NO GROWTH
Special Requests: ADEQUATE
Special Requests: ADEQUATE

## 2019-05-16 LAB — SARS CORONAVIRUS 2 (TAT 6-24 HRS): SARS Coronavirus 2: NEGATIVE

## 2019-05-16 NOTE — Progress Notes (Signed)
PROGRESS NOTE  Manuel King [id number] DOB: [birth year] DOA: 05/11/2019 PCP: Merrilee Seashore, MD  HPI/Recap of past 24 hours: HPI from Dr [name] is a 86 y.o. male with history of stroke, dementia, hypertension, prostate cancer who was recently diagnosed with COVID-19 infection on December 15 about 3 weeks ago at the time patient was asymptomatic started having fever and chills and had 1 episode of nausea vomiting.  Patient was brought to the ER.  Patient has history of dementia but is giving appropriate history and states that he threw up once denies any abdominal pain denies any chest pain or shortness of breath.  Has been having some pain in the right knee and has a small wound in the right knee.  Patient states he usually walks with the help of walker. In the ER, afebrile, tachycardic COVID-19 test was negative, chest x-ray unremarkable.  UA shows features consistent with UTI. Labs show creatinine has increased from 1.6-2.6 potassium 2.7 LFTs shows AST 93 bilirubin 1.5 WBC 16.6 lactic acid 1.4 hemoglobin is at baseline.  Patient started on empiric antibiotics for UTI, IV fluids for acute renal failure admitted for further management.     Today, patient denies any new complaints.  Resting comfortably.    Assessment/Plan: Active Problems:   Essential hypertension   PROSTATE CANCER, HX OF   History of CVA (cerebrovascular accident)   Vascular dementia (HCC)   AKI (acute kidney injury) ([place])   HLD (hyperlipidemia)   Hypokalemia   Nausea & vomiting   ARF (acute renal failure) (HCC)   Pressure injury of skin  SIRS likely from MSSA UTI Currently afebrile, with leukocytosis UA with large leukocytes, rare bacteria, greater than 50 WBC UC growing greater than 100,000 MSSA BC x2 NGTD Procalcitonin 1.89-->1.41-->0.84 Continue PO keflex for a total of 7 days Monitor closely  AKI on CKD stage III Improved Last creatinine on 01/2019 was 1.69, on presentation  2.69 Likely 2/2 poor oral intake in addition to vomiting + on ARB + HCTZ S/P IV fluids Continue to hold ARB, HCTZ Daily CMP  Small amount of hemoptysis Witnessed by RN on 05/15/2019, none since then Unknown etiology, ??irritation of the airway Chest x-ray showed minimal atelectasis, otherwise unremarkable Monitor closely, may hold [place] Heparin if persistent  Anemia of CKD/history of iron deficiency Creatinine baseline between 8-9, stable post 1U of PRBC No obvious signs of bleeding FOBT never collected Anemia panel: Iron 18, sats 14, folate 5.5, ferritin 1,212, Vit B 12: 987 Gave 1 dose of feraheme, and transfused 1U of PRBC on 05/13/19 Continue oral iron supplementation Daily CBC  Mild transaminitis Resolved AST 93, T bili 1.5 Daily CMP, will trend  Hypertension Continue amlodipine, hydralazine, IV hydralazine as needed Continue to hold ARB, HCTZ  History of CVA Continue statins  Depression Continue Depakote, Paxil  History of dementia Continue Aricept         Malnutrition Type:      Malnutrition Characteristics:      Nutrition Interventions:       Estimated body mass index is 26.04 kg/m as calculated from the following:   Height as of this encounter: 6' 3.98" (1.93 m).   Weight as of this encounter: 97 kg.     Code Status: Full  Family Communication: None at bedside  Disposition Plan: SNF, patient will require SNF to enable a safe discharge, bed available, awaiting Covid test, if negative, DC on 05/17/2019   Consultants:  None  Procedures:  None  Antimicrobials: Keflex  DVT prophylaxis: Heparin [place]   Objective: Vitals:   05/15/19 2000 05/15/19 2109 05/16/19 0558 05/16/19 1408  BP:  (!) 146/76 (!) 142/68 121/72  Pulse:  88 83 96  Resp:  20 20 16   Temp:  100 F (37.8 C) 98.4 F (36.9 C) 98.1 F (36.7 C)  TempSrc:  Oral  Oral  SpO2:  100% 100% 97%  Weight: 96 kg  97 kg   Height: 6' 3.98" (1.93 m)       Intake/Output  Summary (Last 24 hours) at 05/16/2019 1628 Last data filed at 05/16/2019 1425 Gross per 24 hour  Intake 1363 ml  Output 525 ml  Net 838 ml   Filed Weights   05/15/19 0531 05/15/19 2000 05/16/19 0558  Weight: 96 kg 96 kg 97 kg    Exam:  General: NAD   Cardiovascular: S1, S2 present  Respiratory: CTAB  Abdomen: Soft, nontender, nondistended, bowel sounds present  Musculoskeletal: No bilateral pedal edema noted  Skin: Normal  Psychiatry: Normal mood    Data Reviewed: CBC: Recent Labs  Lab 05/12/19 0510 05/13/19 0451 05/13/19 1635 05/14/19 0549 05/15/19 0552 05/16/19 0603  WBC 12.4* 11.8*  --  11.0* 13.4* 12.1*  NEUTROABS 9.2* 8.6*  --  8.1* 10.0* 8.6*  HGB 7.1* 7.0* 7.8* 9.2* 9.3* 8.4*  HCT 22.6* 22.7* 24.8* 29.2* 29.7* 27.1*  MCV 82.2 82.5  --  81.6 82.5 83.9  PLT 266 269  --  315 335 [id number]   Basic Metabolic Panel: Recent Labs  Lab 05/12/19 0510 05/13/19 0451 05/14/19 0549 05/15/19 0552 05/16/19 0603  NA 131* 131* 136 136 137  K 3.3* 3.9 3.8 3.7 4.1  CL 103 104 106 105 108  CO2 18* 17* 20* 21* 19*  GLUCOSE 103* 101* 97 100* 99  BUN 67* 55* 41* 35* 28*  CREATININE 2.01* 1.80* 1.40* 1.36* 1.32*  CALCIUM 7.7* 7.8* 8.4* 8.3* 8.1*  MG 2.1  --   --   --   --    GFR: Estimated Creatinine Clearance: 48.4 mL/min (A) (by C-G formula based on SCr of 1.32 mg/dL (H)). Liver Function Tests: Recent Labs  Lab 05/12/19 0510 05/13/19 0451 05/14/19 0549 05/15/19 0552 05/16/19 0603  AST 94* 76* 49* 44* 42*  ALT 38 39 33 32 31  ALKPHOS 78 84 76 81 69  BILITOT 0.3 0.3 1.0 0.7 0.3  PROT 6.1* 6.1* 6.7 6.9 6.1*  ALBUMIN 1.6* 1.6* 1.8* 1.9* 1.7*   Recent Labs  Lab 05/11/19 0237  LIPASE 32   No results for input(s): AMMONIA in the last 168 hours. Coagulation Profile: No results for input(s): INR, PROTIME in the last 168 hours. Cardiac Enzymes: No results for input(s): CKTOTAL, CKMB, CKMBINDEX, TROPONINI in the last 168 hours. BNP (last 3 results) No results  for input(s): PROBNP in the last 8760 hours. HbA1C: No results for input(s): HGBA1C in the last 72 hours. CBG: No results for input(s): GLUCAP in the last 168 hours. Lipid Profile: No results for input(s): CHOL, HDL, LDLCALC, TRIG, CHOLHDL, LDLDIRECT in the last 72 hours. Thyroid Function Tests: No results for input(s): TSH, T4TOTAL, FREET4, T3FREE, THYROIDAB in the last 72 hours. Anemia Panel: No results for input(s): VITAMINB12, FOLATE, FERRITIN, TIBC, IRON, RETICCTPCT in the last 72 hours. Urine analysis:    Component Value Date/Time   COLORURINE YELLOW 05/11/2019 0237   APPEARANCEUR HAZY (A) 05/11/2019 0237   LABSPEC 1.013 05/11/2019 0237   PHURINE 5.0 05/11/2019 0237   GLUCOSEU NEGATIVE 05/11/2019  [place] (A) 05/11/2019 0237   BILIRUBINUR NEGATIVE 05/11/2019 [place] 05/11/2019 0237   PROTEINUR 100 (A) 05/11/2019 0237   UROBILINOGEN 0.2 12/21/2014 1409   NITRITE NEGATIVE 05/11/2019 0237   LEUKOCYTESUR LARGE (A) 05/11/2019 0237   Sepsis Labs: @LABRCNTIP (procalcitonin:4,lacticidven:4)  ) Recent Results (from the past 240 hour(s))  Respiratory Panel by RT PCR (Flu A&B, Covid) - Nasopharyngeal Swab     Status: None   Collection Time: 05/11/19  2:37 AM   Specimen: Nasopharyngeal Swab  Result Value Ref Range Status   SARS Coronavirus 2 by RT PCR NEGATIVE NEGATIVE Final    Comment: (NOTE) SARS-CoV-2 target nucleic acids are NOT DETECTED. The SARS-CoV-2 RNA is generally detectable in upper respiratoy specimens during the acute phase of infection. The lowest concentration of SARS-CoV-2 viral copies this assay can detect is 131 copies/mL. A negative result does not preclude SARS-Cov-2 infection and should not be used as the sole basis for treatment or other patient management decisions. A negative result may occur with  improper specimen collection/handling, submission of specimen other than nasopharyngeal swab, presence of viral mutation(s)  within the areas targeted by this assay, and inadequate number of viral copies (<131 copies/mL). A negative result must be combined with clinical observations, patient history, and epidemiological information. The expected result is Negative. Fact Sheet for Patients:  PinkCheek.be Fact Sheet for Healthcare Providers:  GravelBags.it This test is not yet ap proved or cleared by the [place] FDA and  has been authorized for detection and/or diagnosis of SARS-CoV-2 by FDA under an Emergency Use Authorization (EUA). This EUA will remain  in effect (meaning this test can be used) for the duration of the COVID-19 declaration under Section 564(b)(1) of the Act, 21 U.S.C. section 360bbb-3(b)(1), unless the authorization is terminated or revoked sooner.    Influenza A by PCR NEGATIVE NEGATIVE Final   Influenza B by PCR NEGATIVE NEGATIVE Final    Comment: (NOTE) The Xpert Xpress SARS-CoV-2/FLU/RSV assay is intended as an aid in  the diagnosis of influenza from Nasopharyngeal swab specimens and  should not be used as a sole basis for treatment. Nasal washings and  aspirates are unacceptable for Xpert Xpress SARS-CoV-2/FLU/RSV  testing. Fact Sheet for Patients: PinkCheek.be Fact Sheet for Healthcare Providers: GravelBags.it This test is not yet approved or cleared by the [place] FDA and  has been authorized for detection and/or diagnosis of SARS-CoV-2 by  FDA under an Emergency Use Authorization (EUA). This EUA will remain  in effect (meaning this test can be used) for the duration of the  Covid-19 declaration under Section 564(b)(1) of the Act, 21  U.S.C. section 360bbb-3(b)(1), unless the authorization is  terminated or revoked. Performed at [facility], [place] [address], [place], [place] [address]   Urine culture     Status: Abnormal   Collection  Time: 05/11/19  2:37 AM   Specimen: In/Out Cath Urine  Result Value Ref Range Status   Specimen Description   Final    IN/OUT CATH URINE Performed at [facility] [address], [place], [place] [address]    Special Requests   Final    NONE Performed at [facility], [place] [address] [place], [place] [address]    Culture >=100,000 COLONIES/mL STAPHYLOCOCCUS AUREUS (A)  Final   Report Status 05/13/2019 FINAL  Final   Organism ID, Bacteria STAPHYLOCOCCUS AUREUS (A)  Final      Susceptibility   Staphylococcus  aureus - MIC*    CIPROFLOXACIN >=8 RESISTANT Resistant     GENTAMICIN <=0.5 SENSITIVE Sensitive     NITROFURANTOIN <=16 SENSITIVE Sensitive     OXACILLIN <=0.25 SENSITIVE Sensitive     TETRACYCLINE <=1 SENSITIVE Sensitive     VANCOMYCIN <=0.5 SENSITIVE Sensitive     TRIMETH/SULFA <=10 SENSITIVE Sensitive     CLINDAMYCIN <=0.25 SENSITIVE Sensitive     RIFAMPIN <=0.5 SENSITIVE Sensitive     Inducible Clindamycin NEGATIVE Sensitive     * >=100,000 COLONIES/mL STAPHYLOCOCCUS AUREUS  Culture, blood (routine x 2)     Status: None   Collection Time: 05/11/19  4:10 PM   Specimen: BLOOD LEFT ARM  Result Value Ref Range Status   Specimen Description   Final    BLOOD LEFT ARM Performed at [facility] [address] [place], [place] [address]    Special Requests   Final    BOTTLES DRAWN AEROBIC AND ANAEROBIC Blood Culture adequate volume Performed at [facility] [address] [place], [place] [address]    Culture   Final    NO GROWTH 5 DAYS Performed at [facility], [place] [address], [place], [place] [address]    Report Status 05/16/2019 FINAL  Final  Culture, blood (routine x 2)     Status: None   Collection Time: 05/11/19  4:10 PM   Specimen: BLOOD LEFT HAND  Result Value Ref Range Status   Specimen Description   Final    BLOOD LEFT HAND Performed at [facility] [address] [place], [place] [address]    Special Requests   Final    BOTTLES DRAWN AEROBIC ONLY Blood Culture adequate volume Performed at [facility] [address], [place], [place] [address]    Culture   Final    NO GROWTH 5 DAYS Performed at [facility], [place] [address] [place], [place] [address]    Report Status 05/16/2019 FINAL  Final  MRSA PCR Screening     Status: None   Collection Time: 05/13/19  6:23 AM   Specimen: Nasopharyngeal  Result Value Ref Range Status   MRSA by PCR NEGATIVE NEGATIVE Final    Comment:        The GeneXpert MRSA Assay (FDA approved for NASAL specimens only), is one component of a comprehensive MRSA colonization surveillance program. It is not intended to diagnose MRSA infection nor to guide or monitor treatment for MRSA infections. Performed at [facility], [place] [address], [place], [place] [address]       Studies: No results found.  Scheduled Meds: . allopurinol  100 mg Oral Daily  . amLODipine  5 mg Oral Daily  . cephALEXin  500 mg Oral QID  . divalproex  250 mg Oral Daily  . divalproex  500 mg Oral QHS  . donepezil  10 mg Oral QHS  . ferrous sulfate  325 mg Oral BID  . folic acid  1 mg Oral Daily  . heparin  5,000 Units Subcutaneous Q8H  . hydrALAZINE  25 mg Oral Q8H  . oxybutynin  5 mg Oral TID  . PARoxetine  20 mg Oral Daily  . polyethylene glycol  17 g Oral Daily  . polyvinyl alcohol  2 drop Both Eyes BID  . pravastatin  40 mg Oral QHS  . senna-docusate  1 tablet Oral BID    Continuous Infusions:    LOS: 4 days  Alma Friendly, MD [facility]  If 7PM-7AM, please contact night-coverage [email or web address] 05/16/2019, 4:28 PM

## 2019-05-16 NOTE — Progress Notes (Signed)
Physical Therapy Treatment Patient Details Name: Manuel King MRN: [id number] DOB: [birth year] Today's Date: 05/16/2019    History of Present Illness 86 y.o. male with history of stroke, dementia, hypertension, prostate cancer who was recently diagnosed with COVID-19 infection on December 15 about 3 weeks ago at the time patient was asymptomatic. Pt admitted with fever and chills and had 1 episode of nausea vomiting. Dx of SIRS, UTI, acute on chronic kidney failure.    PT Comments    Pt demonstrated improved transfers and took 3-4 steps with mod A of 2 and RW.  Improved EOB balance, initially needing min A but progressed to stand by assist.  Pt with limited WBing on L LE but reports baseline from prior CVA.  Needs cues for safety and transfer technique.  Cont POC.    Follow Up Recommendations  SNF     Equipment Recommendations  Other (comment)(TBD next venue)    Recommendations for Other Services       Precautions / Restrictions Precautions Precautions: Fall    Mobility  Bed Mobility Overal bed mobility: Needs Assistance Bed Mobility: Supine to Sit     Supine to sit: Mod assist;+2 for physical assistance;HOB elevated     General bed mobility comments: able to do legs once initiated; mod A for trunk  Transfers Overall transfer level: Needs assistance Equipment used: Rolling walker (2 wheeled) Transfers: Sit to/from Stand Sit to Stand: Mod assist;+2 physical assistance;From elevated surface         General transfer comment: assist to boost up; cued for posture; decreased weight on L; not leaning L today  Ambulation/Gait Ambulation/Gait assistance: Mod assist;+2 physical assistance Gait Distance (Feet): 2 Feet Assistive device: Rolling walker (2 wheeled) Gait Pattern/deviations: Decreased stride length;Shuffle;Decreased weight shift to left Gait velocity: decreased   General Gait Details: small steps to chair using RW; cued for posutre; limited weight on L LE and  unable to extend knee (baseline per pt from prior CVAs)   Stairs             Wheelchair Mobility    Modified Rankin (Stroke Patients Only)       Balance Overall balance assessment: Needs assistance Sitting-balance support: Feet supported Sitting balance-Leahy Scale: Fair Sitting balance - Comments: initial min A for balance (post lean), but once stabilized and cued to put feet on floor and lean forward: able to maintain with close stand by assist; EOB 5 mins prior to attempting stand     Standing balance-Leahy Scale: Poor Standing balance comment: mod A for standing balance with RW: had assist of 2 for safet                            Cognition Arousal/Alertness: Awake/alert Behavior During Therapy: WFL for tasks assessed/performed Overall Cognitive Status: No family/caregiver present to determine baseline cognitive functioning                                 General Comments: h/o dementia; able to follow single step commands with visual cues and increased time      Exercises General Exercises - Lower Extremity Ankle Circles/Pumps: AROM;Both;10 reps Long Arc Quad: AROM;Right;AAROM;Left;10 reps;Seated Hip Flexion/Marching: AROM;Right;AAROM;Left;10 reps;Seated Other Exercises Other Exercises: In Chair leaning back and forward for trunk/abdominal activation x 5    General Comments        Pertinent Vitals/Pain Pain Assessment: No/denies pain  Home Living                      Prior Function            PT Goals (current goals can now be found in the care plan section) Progress towards PT goals: Progressing toward goals    Frequency    Min 2X/week      PT Plan Current plan remains appropriate    Co-evaluation              AM-PAC PT "6 Clicks" Mobility   Outcome Measure  Help needed turning from your back to your side while in a flat bed without using bedrails?: A Lot Help needed moving from lying on your  back to sitting on the side of a flat bed without using bedrails?: A Lot Help needed moving to and from a bed to a chair (including a wheelchair)?: A Lot Help needed standing up from a chair using your arms (e.g., wheelchair or bedside chair)?: A Lot Help needed to walk in hospital room?: Total Help needed climbing 3-5 steps with a railing? : Total 6 Click Score: 10    End of Session Equipment Utilized During Treatment: Gait belt Activity Tolerance: Patient tolerated treatment well;No increased pain Patient left: in chair;with call bell/phone within reach;with chair alarm set Nurse Communication: Mobility status;Need for lift equipment(hoyer pad in place) PT Visit Diagnosis: Unsteadiness on feet (R26.81);Difficulty in walking, not elsewhere classified (R26.2);Muscle weakness (generalized) (M62.81)     Time: 1255-1316 PT Time Calculation (min) (ACUTE ONLY): 21 min  Charges:  $Therapeutic Activity: 8-22 mins                     Manuel King, PT Acute Rehab Services Pager [phone] [facility] [phone] [facility] [phone]    Manuel King 05/16/2019, 1:58 PM

## 2019-05-16 NOTE — TOC Progression Note (Signed)
Transition of Care [facility]) - Progression Note    Patient Details  Name: Manuel King MRN: [id number] Date of Birth: [birth year]  Transition [facility]) CM/SW [place], LCSW Phone Number: 05/16/2019, 10:16 AM  Clinical Narrative:     [facility] accepted. Patient can transfer to SNF tomorrow pending covid-19 test results.  Patient daughter [name] updated .   Expected Discharge Plan: [place] Barriers to Discharge: Barriers Resolved, Other (comment)(COVID-19 test pending)  Expected Discharge Plan and Services Expected Discharge Plan: [place] In-house Referral: Clinical Social Work Discharge Planning Services: CM Consult Post Acute Care Choice: [place] arrangements for the past 2 months: [place]                                       Social Determinants of Health (SDOH) Interventions    Readmission Risk Interventions No flowsheet data found.

## 2019-05-17 DIAGNOSIS — L89153 Pressure ulcer of sacral region, stage 3: Secondary | ICD-10-CM | POA: Diagnosis not present

## 2019-05-17 DIAGNOSIS — Z743 Need for continuous supervision: Secondary | ICD-10-CM | POA: Diagnosis not present

## 2019-05-17 DIAGNOSIS — Z8673 Personal history of transient ischemic attack (TIA), and cerebral infarction without residual deficits: Secondary | ICD-10-CM | POA: Diagnosis not present

## 2019-05-17 DIAGNOSIS — E876 Hypokalemia: Secondary | ICD-10-CM | POA: Diagnosis not present

## 2019-05-17 DIAGNOSIS — A498 Other bacterial infections of unspecified site: Secondary | ICD-10-CM | POA: Diagnosis not present

## 2019-05-17 DIAGNOSIS — E785 Hyperlipidemia, unspecified: Secondary | ICD-10-CM | POA: Diagnosis not present

## 2019-05-17 DIAGNOSIS — R410 Disorientation, unspecified: Secondary | ICD-10-CM | POA: Diagnosis not present

## 2019-05-17 DIAGNOSIS — R531 Weakness: Secondary | ICD-10-CM | POA: Diagnosis not present

## 2019-05-17 DIAGNOSIS — N178 Other acute kidney failure: Secondary | ICD-10-CM | POA: Diagnosis not present

## 2019-05-17 DIAGNOSIS — N179 Acute kidney failure, unspecified: Secondary | ICD-10-CM | POA: Diagnosis not present

## 2019-05-17 DIAGNOSIS — R2689 Other abnormalities of gait and mobility: Secondary | ICD-10-CM | POA: Diagnosis not present

## 2019-05-17 DIAGNOSIS — D638 Anemia in other chronic diseases classified elsewhere: Secondary | ICD-10-CM | POA: Diagnosis not present

## 2019-05-17 DIAGNOSIS — I10 Essential (primary) hypertension: Secondary | ICD-10-CM | POA: Diagnosis not present

## 2019-05-17 DIAGNOSIS — R6510 Systemic inflammatory response syndrome (SIRS) of non-infectious origin without acute organ dysfunction: Secondary | ICD-10-CM | POA: Diagnosis not present

## 2019-05-17 DIAGNOSIS — J028 Acute pharyngitis due to other specified organisms: Secondary | ICD-10-CM | POA: Diagnosis not present

## 2019-05-17 DIAGNOSIS — I6930 Unspecified sequelae of cerebral infarction: Secondary | ICD-10-CM | POA: Diagnosis not present

## 2019-05-17 DIAGNOSIS — I469 Cardiac arrest, cause unspecified: Secondary | ICD-10-CM | POA: Diagnosis not present

## 2019-05-17 DIAGNOSIS — R279 Unspecified lack of coordination: Secondary | ICD-10-CM | POA: Diagnosis not present

## 2019-05-17 DIAGNOSIS — I129 Hypertensive chronic kidney disease with stage 1 through stage 4 chronic kidney disease, or unspecified chronic kidney disease: Secondary | ICD-10-CM | POA: Diagnosis not present

## 2019-05-17 DIAGNOSIS — A419 Sepsis, unspecified organism: Secondary | ICD-10-CM | POA: Diagnosis not present

## 2019-05-17 DIAGNOSIS — M6281 Muscle weakness (generalized): Secondary | ICD-10-CM | POA: Diagnosis not present

## 2019-05-17 DIAGNOSIS — R2681 Unsteadiness on feet: Secondary | ICD-10-CM | POA: Diagnosis not present

## 2019-05-17 DIAGNOSIS — R7401 Elevation of levels of liver transaminase levels: Secondary | ICD-10-CM | POA: Diagnosis not present

## 2019-05-17 DIAGNOSIS — N390 Urinary tract infection, site not specified: Secondary | ICD-10-CM | POA: Diagnosis not present

## 2019-05-17 LAB — BPAM RBC
Blood Product Expiration Date: 202102092359
Blood Product Expiration Date: 202102092359
Blood Product Expiration Date: 202102092359
ISSUE DATE / TIME: 202101081253
Unit Type and Rh: 5100
Unit Type and Rh: 5100
Unit Type and Rh: 5100

## 2019-05-17 LAB — TYPE AND SCREEN
ABO/RH(D): O POS
Antibody Screen: NEGATIVE
Unit division: 0
Unit division: 0
Unit division: 0

## 2019-05-17 MED ORDER — FOLIC ACID 1 MG PO TABS: 1.0000 mg | ORAL_TABLET | Freq: Every day | ORAL | 0 refills | Status: AC

## 2019-05-17 MED ORDER — CEPHALEXIN 500 MG PO CAPS: 500.0000 mg | ORAL_CAPSULE | Freq: Four times a day (QID) | ORAL | 0 refills | Status: AC

## 2019-05-17 NOTE — Plan of Care (Signed)

## 2019-05-17 NOTE — Consult Note (Signed)
   [facility] CM Inpatient Consult   05/17/2019  JAYVEN BOERSMA 28-Dec-1929 [id number]   Patient screened for potential [facility] Care Management services due to unplanned readmission risk score of 28%, high.  Per chart review, patient to discharge to [facility]. No [facility] CM needs.  Netta Cedars, MSN, [facility] Liaison Nurse Mobile Phone [phone]  Toll free office [phone]

## 2019-05-17 NOTE — TOC Transition Note (Signed)
Transition of Care [facility]) - CM/SW Discharge Note   Patient Details  Name: BONNER JANI MRN: [id number] Date of Birth: 25-Aug-1929  [facility]) CM/SW Contact:  Trish Mage, LCSW Phone Number: 05/17/2019, 11:00 AM   Clinical Narrative:   Patient to d/c today to [facility].  [facility] transportation arranged. Patient informed.  Nursing, please call report to [phone], room 1202P.  TOC sign off.    Final next level of care: Skilled Nursing Facility Barriers to Discharge: Barriers Resolved   Patient Goals and CMS Choice   CMS Medicare.gov Compare Post Acute Care list provided to:: Other (Comment Required)(Sibling) Choice offered to / list presented to : Sibling  Discharge Placement                       Discharge Plan and Services In-house Referral: Clinical Social Work Discharge Planning Services: CM Consult Post Acute Care Choice: Skilled Nursing Facility                               Social Determinants of Health (SDOH) Interventions     Readmission Risk Interventions No flowsheet data found.

## 2019-05-17 NOTE — Progress Notes (Signed)
Marcelo Baldy to be D/C'd Skilled nursing facility per MD order.  Discussed prescriptions and follow up appointments with the patient. Prescriptions given to patient, medication list explained in detail. Pt verbalized understanding.  Allergies as of 05/17/2019   No Known Allergies     Medication List    STOP taking these medications   aspirin 81 MG EC tablet   feeding supplement (ENSURE ENLIVE) Liqd   sodium bicarbonate 650 MG tablet   tamsulosin 0.4 MG Caps capsule Commonly known as: FLOMAX   vitamin B-12 1000 MCG tablet Commonly known as: CYANOCOBALAMIN     TAKE these medications   acetaminophen 500 MG tablet Commonly known as: TYLENOL Take 500 mg by mouth 2 (two) times daily.   acetaminophen 325 MG tablet Commonly known as: TYLENOL Take 2 tablets (650 mg total) by mouth every 6 (six) hours as needed for mild pain (or Fever >/= 101).   allopurinol 100 MG tablet Commonly known as: ZYLOPRIM Take 100 mg by mouth daily.   amLODipine 5 MG tablet Commonly known as: NORVASC Take 5 mg by mouth daily. What changed: Another medication with the same name was removed. Continue taking this medication, and follow the directions you see here.   cephALEXin 500 MG capsule Commonly known as: KEFLEX Take 1 capsule (500 mg total) by mouth 4 (four) times daily for 11 doses.   divalproex 250 MG DR tablet Commonly known as: DEPAKOTE Take 250-500 mg by mouth See admin instructions. Take 250 mg  tablet in the am and 500 mg  tablets in the pm   docusate sodium 100 MG capsule Commonly known as: COLACE Take 100 mg by mouth daily.   donepezil 10 MG tablet Commonly known as: ARICEPT Take 10 mg by mouth at bedtime.   ferrous sulfate 325 (65 FE) MG EC tablet Take 325 mg by mouth 2 (two) times daily.   folic acid 1 MG tablet Commonly known as: FOLVITE Take 1 tablet (1 mg total) by mouth daily. Start taking on: May 18, 2019   hydrALAZINE 25 MG tablet Commonly known as:  APRESOLINE Take 1 tablet (25 mg total) by mouth every 8 (eight) hours.   hydrochlorothiazide 12.5 MG tablet Commonly known as: HYDRODIURIL Take 12.5 mg by mouth daily.   irbesartan 150 MG tablet Commonly known as: AVAPRO Take 150 mg by mouth daily.   oxybutynin 5 MG tablet Commonly known as: DITROPAN Take 5 mg by mouth 3 (three) times daily.   PARoxetine 20 MG tablet Commonly known as: PAXIL Take 20 mg by mouth daily.   pravastatin 40 MG tablet Commonly known as: PRAVACHOL Take 40 mg by mouth at bedtime.   Systane 0.4-0.3 % Soln Generic drug: Polyethyl Glycol-Propyl Glycol Place 2 drops into both eyes 2 (two) times daily.   traMADol 50 MG tablet Commonly known as: ULTRAM Take 1 tablet (50 mg total) by mouth daily.       Vitals:   05/16/19 2032 05/17/19 0535  BP: (!) 146/70 (!) 162/66  Pulse: 87 81  Resp: 16 16  Temp: 100 F (37.8 C) 97.9 F (36.6 C)  SpO2: 99% 100%    Skin clean, dry and intact without evidence of skin break down, no evidence of skin tears noted. IV catheter discontinued intact. Site without signs and symptoms of complications. Dressing and pressure applied. Pt denies pain at this time. No complaints noted.  An After Visit Summary was printed and given to [facility] and receiving facility. Patient to transfer to [facility] via [place]. SW has arranged for transportation.  Lolita Rieger 05/17/2019 11:43 AM

## 2019-05-17 NOTE — Discharge Summary (Signed)
Discharge Summary  Manuel King [id number] DOB: 1930-03-09  PCP: Merrilee Seashore, MD  Admit date: 05/11/2019 Discharge date: 05/17/2019  Time spent: 40 mins  Recommendations for Outpatient Follow-up:  1. Follow-up with PCP in 1 week  Discharge Diagnoses:  Active Hospital Problems   Diagnosis Date Noted  . Hypokalemia 05/11/2019  . Nausea & vomiting 05/11/2019  . ARF (acute renal failure) ([facility]) 05/11/2019  . Pressure injury of skin 05/11/2019  . HLD (hyperlipidemia) 08/18/2017  . AKI (acute kidney injury) ([place]) 09/12/2011  . Vascular dementia ([place]) 05/10/2011  . History of CVA (cerebrovascular accident) 05/10/2011  . PROSTATE CANCER, HX OF 09/28/2008  . Essential hypertension 09/28/2008    Resolved Hospital Problems  No resolved problems to display.    Discharge Condition: Stable  Diet recommendation: As tolerated  Vitals:   05/16/19 2032 05/17/19 0535  BP: (!) 146/70 (!) 162/66  Pulse: 87 81  Resp: 16 16  Temp: 100 F (37.8 C) 97.9 F (36.6 C)  SpO2: 99% 100%    History of present illness:  Manuel King a 86 y.o.malewithhistory of stroke, dementia, hypertension, prostate cancer who was recently diagnosed with COVID-19 infection on December 15 about 3 weeks ago at the time patient was asymptomatic started having fever and chills and had 1 episode of nausea vomiting. Patient was brought to the ER. Patient has history of dementia but is giving appropriate history and states that he threw up once denies any abdominal pain denies any chest pain or shortness of breath. Has been having some pain in the right knee and has a small wound in the right knee. Patient states he usually walks with the help of walker. In the ER, afebrile, tachycardic COVID-19 test was negative, chest x-ray unremarkable. UA shows features consistent with UTI. Labs show creatinine has increased from 1.6-2.6 potassium 2.7 LFTs shows AST 93 bilirubin 1.5 WBC 16.6 lactic acid 1.4  hemoglobin is at baseline. Patient started on empiric antibiotics for UTI, IV fluids for acute renal failure admitted for further management.    Today, patient denies any new complaints.  Patient stable to discharge to SNF.    Hospital Course:  Active Problems:   Essential hypertension   PROSTATE CANCER, HX OF   History of CVA (cerebrovascular accident)   Vascular dementia ([facility])   AKI (acute kidney injury) ([place])   HLD (hyperlipidemia)   Hypokalemia   Nausea & vomiting   ARF (acute renal failure) ([facility])   Pressure injury of skin   SIRS likely from MSSA UTI Currently afebrile, with resolving leukocytosis UA with large leukocytes, rare bacteria, greater than 50 WBC UC growing greater than 100,000 MSSA BC x2 NGTD Procalcitonin 1.89-->1.41-->0.84 Chest x-ray showed minimal atelectasis, otherwise unremarkable Continue PO keflex for a total of 7 days, last day of antibiotics 05/19/2019  AKI on CKD stage III Improved Last creatinine on 01/2019 was 1.69, on presentation 2.69, now 1.32 Likely 2/2 poor oral intake in addition to vomiting + on ARB + HCTZ S/P IV fluids  Anemia of CKD/history of iron deficiency Creatinine baseline between 8-9, stable post 1U of PRBC No obvious signs of bleeding Anemia panel: Iron 18, sats 14, folate 5.5, ferritin 1,212, Vit B 12: 987 Gave 1 dose of feraheme, and transfused 1U of PRBC on 05/13/19 Continue oral iron, folate supplementation  Mild transaminitis Resolved  Hypertension Continue home medications  History of CVA Continue statins  Depression Continue Depakote, Paxil  History of dementia Continue Aricept  Malnutrition Type:      Malnutrition Characteristics:      Nutrition Interventions:      Estimated body mass index is 27.38 kg/m as calculated from the following:   Height as of this encounter: 6' 3.98" (1.93 m).   Weight as of this encounter: 102 kg.     Procedures:  None  Consultations:  None  Discharge Exam: BP (!) 162/66 (BP Location: Right Arm)   Pulse 81   Temp 97.9 F (36.6 C) (Oral)   Resp 16   Ht 6' 3.98" (1.93 m)   Wt 102 kg   SpO2 100%   BMI 27.38 kg/m   General: NAD Cardiovascular: S1, S2 present Respiratory: CTA B  Discharge Instructions You were cared for by a hospitalist during your hospital stay. If you have any questions about your discharge medications or the care you received while you were in the hospital after you are discharged, you can call the unit and asked to speak with the hospitalist on call if the hospitalist that took care of you is not available. Once you are discharged, your primary care physician will handle any further medical issues. Please note that NO REFILLS for any discharge medications will be authorized once you are discharged, as it is imperative that you return to your primary care physician (or establish a relationship with a primary care physician if you do not have one) for your aftercare needs so that they can reassess your need for medications and monitor your lab values.  Discharge Instructions    Diet - low sodium heart healthy   Complete by: As directed    Increase activity slowly   Complete by: As directed      Allergies as of 05/17/2019   No Known Allergies     Medication List    STOP taking these medications   aspirin 81 MG EC tablet   feeding supplement (ENSURE ENLIVE) Liqd   sodium bicarbonate 650 MG tablet   tamsulosin 0.4 MG Caps capsule Commonly known as: FLOMAX   vitamin B-12 1000 MCG tablet Commonly known as: CYANOCOBALAMIN     TAKE these medications   acetaminophen 500 MG tablet Commonly known as: TYLENOL Take 500 mg by mouth 2 (two) times daily.   acetaminophen 325 MG tablet Commonly known as: TYLENOL Take 2 tablets (650 mg total) by mouth every 6 (six) hours as needed for mild pain (or Fever >/= 101).   allopurinol 100 MG  tablet Commonly known as: ZYLOPRIM Take 100 mg by mouth daily.   amLODipine 5 MG tablet Commonly known as: NORVASC Take 5 mg by mouth daily. What changed: Another medication with the same name was removed. Continue taking this medication, and follow the directions you see here.   cephALEXin 500 MG capsule Commonly known as: KEFLEX Take 1 capsule (500 mg total) by mouth 4 (four) times daily for 11 doses.   divalproex 250 MG DR tablet Commonly known as: DEPAKOTE Take 250-500 mg by mouth See admin instructions. Take 250 mg  tablet in the am and 500 mg  tablets in the pm   docusate sodium 100 MG capsule Commonly known as: COLACE Take 100 mg by mouth daily.   donepezil 10 MG tablet Commonly known as: ARICEPT Take 10 mg by mouth at bedtime.   ferrous sulfate 325 (65 FE) MG EC tablet Take 325 mg by mouth 2 (two) times daily.   folic acid 1 MG tablet Commonly known as: FOLVITE Take 1 tablet (1 mg  total) by mouth daily. Start taking on: May 18, 2019   hydrALAZINE 25 MG tablet Commonly known as: APRESOLINE Take 1 tablet (25 mg total) by mouth every 8 (eight) hours.   hydrochlorothiazide 12.5 MG tablet Commonly known as: HYDRODIURIL Take 12.5 mg by mouth daily.   irbesartan 150 MG tablet Commonly known as: AVAPRO Take 150 mg by mouth daily.   oxybutynin 5 MG tablet Commonly known as: DITROPAN Take 5 mg by mouth 3 (three) times daily.   PARoxetine 20 MG tablet Commonly known as: PAXIL Take 20 mg by mouth daily.   pravastatin 40 MG tablet Commonly known as: PRAVACHOL Take 40 mg by mouth at bedtime.   Systane 0.4-0.3 % Soln Generic drug: Polyethyl Glycol-Propyl Glycol Place 2 drops into both eyes 2 (two) times daily.   traMADol 50 MG tablet Commonly known as: ULTRAM Take 1 tablet (50 mg total) by mouth daily.      No Known Allergies  Contact information for follow-up providers    Merrilee Seashore, MD. Schedule an appointment as soon as possible for a  visit in 1 week(s).   Specialty: Internal Medicine Contact information: [address] [place] [address] [phone]            Contact information for after-discharge care    Destination    [facility] .   Service: Skilled Nursing Contact information: [place] [phone]                   The results of significant diagnostics from this hospitalization (including imaging, microbiology, ancillary and laboratory) are listed below for reference.    Significant Diagnostic Studies: DG CHEST PORT 1 VIEW  Result Date: 05/15/2019 CLINICAL DATA:  Cough. COVID-19. EXAM: PORTABLE CHEST 1 VIEW COMPARISON:  05/11/2019 and 01/07/2019 FINDINGS: Heart size and pulmonary vascularity are normal. Aortic atherosclerosis. Left lung is clear. Minimal atelectasis at the right lung base, possibly due to a shallow inspiration. No effusions. No bone abnormality. IMPRESSION: Minimal atelectasis at the right lung base, possibly due to a shallow inspiration. Aortic Atherosclerosis (ICD10-I70.0). Electronically Signed   By: Lorriane Shire M.D.   On: 05/15/2019 12:37   DG Chest Port 1 View  Result Date: 05/11/2019 CLINICAL DATA:  86 year old male with vomiting. Positive COVID-19 EXAM: PORTABLE CHEST 1 VIEW COMPARISON:  Chest radiograph dated 01/07/2019. FINDINGS: Faint right infrahilar density similar to prior radiograph may represent all Alexis or minimal scarring. No focal consolidation, pleural effusion, or pneumothorax. The cardiac silhouette is within normal limits. Atherosclerotic calcification of the aorta. No acute osseous pathology. IMPRESSION: No active disease. No interval change. Electronically Signed   By: Anner Crete M.D.   On: 05/11/2019 02:25    Microbiology: Recent Results (from the past 240 hour(s))  Respiratory Panel by RT PCR (Flu A&B, Covid) - Nasopharyngeal Swab     Status: None   Collection  Time: 05/11/19  2:37 AM   Specimen: Nasopharyngeal Swab  Result Value Ref Range Status   SARS Coronavirus 2 by RT PCR NEGATIVE NEGATIVE Final    Comment: (NOTE) SARS-CoV-2 target nucleic acids are NOT DETECTED. The SARS-CoV-2 RNA is generally detectable in upper respiratoy specimens during the acute phase of infection. The lowest concentration of SARS-CoV-2 viral copies this assay can detect is 131 copies/mL. A negative result does not preclude SARS-Cov-2 infection and should not be used as the sole basis for treatment or other patient management decisions. A negative result may  occur with  improper specimen collection/handling, submission of specimen other than nasopharyngeal swab, presence of viral mutation(s) within the areas targeted by this assay, and inadequate number of viral copies (<131 copies/mL). A negative result must be combined with clinical observations, patient history, and epidemiological information. The expected result is Negative. Fact Sheet for Patients:  PinkCheek.be Fact Sheet for Healthcare Providers:  GravelBags.it This test is not yet ap proved or cleared by the [place] FDA and  has been authorized for detection and/or diagnosis of SARS-CoV-2 by FDA under an Emergency Use Authorization (EUA). This EUA will remain  in effect (meaning this test can be used) for the duration of the COVID-19 declaration under Section 564(b)(1) of the Act, 21 U.S.C. section 360bbb-3(b)(1), unless the authorization is terminated or revoked sooner.    Influenza A by PCR NEGATIVE NEGATIVE Final   Influenza B by PCR NEGATIVE NEGATIVE Final    Comment: (NOTE) The Xpert Xpress SARS-CoV-2/FLU/RSV assay is intended as an aid in  the diagnosis of influenza from Nasopharyngeal swab specimens and  should not be used as a sole basis for treatment. Nasal washings and  aspirates are unacceptable for Xpert Xpress  SARS-CoV-2/FLU/RSV  testing. Fact Sheet for Patients: PinkCheek.be Fact Sheet for Healthcare Providers: GravelBags.it This test is not yet approved or cleared by the [place] FDA and  has been authorized for detection and/or diagnosis of SARS-CoV-2 by  FDA under an Emergency Use Authorization (EUA). This EUA will remain  in effect (meaning this test can be used) for the duration of the  Covid-19 declaration under Section 564(b)(1) of the Act, 21  U.S.C. section 360bbb-3(b)(1), unless the authorization is  terminated or revoked. Performed at [facility], [place] [address], [place], [place] [address]   Urine culture     Status: Abnormal   Collection Time: 05/11/19  2:37 AM   Specimen: In/Out Cath Urine  Result Value Ref Range Status   Specimen Description   Final    IN/OUT CATH URINE Performed at [facility] [address], [place], [place] [address]    Special Requests   Final    NONE Performed at [facility], [place] [address] [place], [place] [address]    Culture >=100,000 COLONIES/mL STAPHYLOCOCCUS AUREUS (A)  Final   Report Status 05/13/2019 FINAL  Final   Organism ID, Bacteria STAPHYLOCOCCUS AUREUS (A)  Final      Susceptibility   Staphylococcus aureus - MIC*    CIPROFLOXACIN >=8 RESISTANT Resistant     GENTAMICIN <=0.5 SENSITIVE Sensitive     NITROFURANTOIN <=16 SENSITIVE Sensitive     OXACILLIN <=0.25 SENSITIVE Sensitive     TETRACYCLINE <=1 SENSITIVE Sensitive     VANCOMYCIN <=0.5 SENSITIVE Sensitive     TRIMETH/SULFA <=10 SENSITIVE Sensitive     CLINDAMYCIN <=0.25 SENSITIVE Sensitive     RIFAMPIN <=0.5 SENSITIVE Sensitive     Inducible Clindamycin NEGATIVE Sensitive     * >=100,000 COLONIES/mL STAPHYLOCOCCUS AUREUS  Culture, blood (routine x 2)     Status: None   Collection Time: 05/11/19  4:10 PM   Specimen: BLOOD LEFT ARM  Result Value Ref  Range Status   Specimen Description   Final    BLOOD LEFT ARM Performed at [facility] [address] [place], [place] [address]    Special Requests   Final    BOTTLES DRAWN AEROBIC AND ANAEROBIC Blood Culture adequate volume Performed at [facility]  [address] [place], [place] [address]    Culture   Final    NO GROWTH 5 DAYS Performed at [facility], [place] [address], [place], [place] [address]    Report Status 05/16/2019 FINAL  Final  Culture, blood (routine x 2)     Status: None   Collection Time: 05/11/19  4:10 PM   Specimen: BLOOD LEFT HAND  Result Value Ref Range Status   Specimen Description   Final    BLOOD LEFT HAND Performed at [facility] [address], [place], [place] [address]    Special Requests   Final    BOTTLES DRAWN AEROBIC ONLY Blood Culture adequate volume Performed at [facility] [address] [place], [place] [address]    Culture   Final    NO GROWTH 5 DAYS Performed at [facility], [place] [address] [place], [place] [address]    Report Status 05/16/2019 FINAL  Final  MRSA PCR Screening     Status: None   Collection Time: 05/13/19  6:23 AM   Specimen: Nasopharyngeal  Result Value Ref Range Status   MRSA by PCR NEGATIVE NEGATIVE Final    Comment:        The GeneXpert MRSA Assay (FDA approved for NASAL specimens only), is one component of a comprehensive MRSA colonization surveillance program. It is not intended to diagnose MRSA infection nor to guide or monitor treatment for MRSA infections. Performed at [facility], [place] [address] [place], [place] [address]   SARS CORONAVIRUS 2 (TAT 6-24 HRS) Nasopharyngeal Nasopharyngeal Swab     Status: None   Collection Time: 05/16/19 11:15 AM   Specimen: Nasopharyngeal Swab  Result Value Ref Range Status   SARS Coronavirus 2 NEGATIVE NEGATIVE Final    Comment: (NOTE) SARS-CoV-2  target nucleic acids are NOT DETECTED. The SARS-CoV-2 RNA is generally detectable in upper and lower respiratory specimens during the acute phase of infection. Negative results do not preclude SARS-CoV-2 infection, do not rule out co-infections with other pathogens, and should not be used as the sole basis for treatment or other patient management decisions. Negative results must be combined with clinical observations, patient history, and epidemiological information. The expected result is Negative. Fact Sheet for Patients: SugarRoll.be Fact Sheet for Healthcare Providers: https://www.woods-mathews.com/ This test is not yet approved or cleared by the [place] FDA and  has been authorized for detection and/or diagnosis of SARS-CoV-2 by FDA under an Emergency Use Authorization (EUA). This EUA will remain  in effect (meaning this test can be used) for the duration of the COVID-19 declaration under Section 56 4(b)(1) of the Act, 21 U.S.C. section 360bbb-3(b)(1), unless the authorization is terminated or revoked sooner. Performed at [facility], [place] [address], [place], [place] [address]      Labs: Basic Metabolic Panel: Recent Labs  Lab 05/12/19 0510 05/13/19 0451 05/14/19 0549 05/15/19 0552 05/16/19 0603  NA 131* 131* 136 136 137  K 3.3* 3.9 3.8 3.7 4.1  CL 103 104 106 105 108  CO2 18* 17* 20* 21* 19*  GLUCOSE 103* 101* 97 100* 99  BUN 67* 55* 41* 35* 28*  CREATININE 2.01* 1.80* 1.40* 1.36* 1.32*  CALCIUM 7.7* 7.8* 8.4* 8.3* 8.1*  MG 2.1  --   --   --   --    Liver Function Tests: Recent Labs  Lab 05/12/19 0510 05/13/19 0451 05/14/19 0549 05/15/19 0552 05/16/19 0603  AST 94* 76* 49* 44*  42*  ALT 38 39 33 32 31  ALKPHOS 78 84 76 81 69  BILITOT 0.3 0.3 1.0 0.7 0.3  PROT 6.1* 6.1* 6.7 6.9 6.1*  ALBUMIN 1.6* 1.6* 1.8* 1.9* 1.7*   Recent Labs  Lab 05/11/19 0237  LIPASE 32   No results for input(s): AMMONIA in  the last 168 hours. CBC: Recent Labs  Lab 05/12/19 0510 05/13/19 0451 05/13/19 1635 05/14/19 0549 05/15/19 0552 05/16/19 0603  WBC 12.4* 11.8*  --  11.0* 13.4* 12.1*  NEUTROABS 9.2* 8.6*  --  8.1* 10.0* 8.6*  HGB 7.1* 7.0* 7.8* 9.2* 9.3* 8.4*  HCT 22.6* 22.7* 24.8* 29.2* 29.7* 27.1*  MCV 82.2 82.5  --  81.6 82.5 83.9  PLT 266 269  --  315 335 320   Cardiac Enzymes: No results for input(s): CKTOTAL, CKMB, CKMBINDEX, TROPONINI in the last 168 hours. BNP: BNP (last 3 results) No results for input(s): BNP in the last 8760 hours.  ProBNP (last 3 results) No results for input(s): PROBNP in the last 8760 hours.  CBG: No results for input(s): GLUCAP in the last 168 hours.     Signed:  Alma Friend[facility], MD [facility] 05/17/2019, 11:25 AM

## 2019-05-18 DIAGNOSIS — N390 Urinary tract infection, site not specified: Secondary | ICD-10-CM | POA: Diagnosis not present

## 2019-05-18 DIAGNOSIS — R6510 Systemic inflammatory response syndrome (SIRS) of non-infectious origin without acute organ dysfunction: Secondary | ICD-10-CM | POA: Diagnosis not present

## 2019-05-18 DIAGNOSIS — R7401 Elevation of levels of liver transaminase levels: Secondary | ICD-10-CM | POA: Diagnosis not present

## 2019-05-18 DIAGNOSIS — D638 Anemia in other chronic diseases classified elsewhere: Secondary | ICD-10-CM | POA: Diagnosis not present

## 2019-05-18 DIAGNOSIS — N178 Other acute kidney failure: Secondary | ICD-10-CM | POA: Diagnosis not present

## 2019-05-19 DIAGNOSIS — R6510 Systemic inflammatory response syndrome (SIRS) of non-infectious origin without acute organ dysfunction: Secondary | ICD-10-CM | POA: Diagnosis not present

## 2019-05-19 DIAGNOSIS — N178 Other acute kidney failure: Secondary | ICD-10-CM | POA: Diagnosis not present

## 2019-05-19 DIAGNOSIS — I129 Hypertensive chronic kidney disease with stage 1 through stage 4 chronic kidney disease, or unspecified chronic kidney disease: Secondary | ICD-10-CM | POA: Diagnosis not present

## 2019-05-19 DIAGNOSIS — N390 Urinary tract infection, site not specified: Secondary | ICD-10-CM | POA: Diagnosis not present

## 2019-05-31 DIAGNOSIS — L89153 Pressure ulcer of sacral region, stage 3: Secondary | ICD-10-CM | POA: Diagnosis not present

## 2019-05-31 DIAGNOSIS — N390 Urinary tract infection, site not specified: Secondary | ICD-10-CM | POA: Diagnosis not present

## 2019-05-31 DIAGNOSIS — I6930 Unspecified sequelae of cerebral infarction: Secondary | ICD-10-CM | POA: Diagnosis not present

## 2019-05-31 DIAGNOSIS — I10 Essential (primary) hypertension: Secondary | ICD-10-CM | POA: Diagnosis not present

## 2019-06-03 DIAGNOSIS — I10 Essential (primary) hypertension: Secondary | ICD-10-CM | POA: Diagnosis not present

## 2019-06-03 DIAGNOSIS — J028 Acute pharyngitis due to other specified organisms: Secondary | ICD-10-CM | POA: Diagnosis not present

## 2019-06-06 DIAGNOSIS — I10 Essential (primary) hypertension: Secondary | ICD-10-CM | POA: Diagnosis not present

## 2019-06-06 DIAGNOSIS — E782 Mixed hyperlipidemia: Secondary | ICD-10-CM | POA: Diagnosis not present

## 2019-06-06 DIAGNOSIS — D509 Iron deficiency anemia, unspecified: Secondary | ICD-10-CM | POA: Diagnosis not present

## 2019-06-07 DIAGNOSIS — I129 Hypertensive chronic kidney disease with stage 1 through stage 4 chronic kidney disease, or unspecified chronic kidney disease: Secondary | ICD-10-CM | POA: Diagnosis not present

## 2019-06-07 DIAGNOSIS — L89153 Pressure ulcer of sacral region, stage 3: Secondary | ICD-10-CM | POA: Diagnosis not present

## 2019-06-07 DIAGNOSIS — G8194 Hemiplegia, unspecified affecting left nondominant side: Secondary | ICD-10-CM | POA: Diagnosis not present

## 2019-06-07 DIAGNOSIS — I6389 Other cerebral infarction: Secondary | ICD-10-CM | POA: Diagnosis not present

## 2019-06-11 DIAGNOSIS — S81011D Laceration without foreign body, right knee, subsequent encounter: Secondary | ICD-10-CM | POA: Diagnosis not present

## 2019-06-11 DIAGNOSIS — D631 Anemia in chronic kidney disease: Secondary | ICD-10-CM | POA: Diagnosis not present

## 2019-06-11 DIAGNOSIS — L89310 Pressure ulcer of right buttock, unstageable: Secondary | ICD-10-CM | POA: Diagnosis not present

## 2019-06-11 DIAGNOSIS — Z8616 Personal history of COVID-19: Secondary | ICD-10-CM | POA: Diagnosis not present

## 2019-06-11 DIAGNOSIS — L89312 Pressure ulcer of right buttock, stage 2: Secondary | ICD-10-CM | POA: Diagnosis not present

## 2019-06-11 DIAGNOSIS — L89610 Pressure ulcer of right heel, unstageable: Secondary | ICD-10-CM | POA: Diagnosis not present

## 2019-06-11 DIAGNOSIS — L89622 Pressure ulcer of left heel, stage 2: Secondary | ICD-10-CM | POA: Diagnosis not present

## 2019-06-11 DIAGNOSIS — E785 Hyperlipidemia, unspecified: Secondary | ICD-10-CM | POA: Diagnosis not present

## 2019-06-11 DIAGNOSIS — Z9181 History of falling: Secondary | ICD-10-CM | POA: Diagnosis not present

## 2019-06-11 DIAGNOSIS — N183 Chronic kidney disease, stage 3 unspecified: Secondary | ICD-10-CM | POA: Diagnosis not present

## 2019-06-11 DIAGNOSIS — S91312D Laceration without foreign body, left foot, subsequent encounter: Secondary | ICD-10-CM | POA: Diagnosis not present

## 2019-06-11 DIAGNOSIS — B9561 Methicillin susceptible Staphylococcus aureus infection as the cause of diseases classified elsewhere: Secondary | ICD-10-CM | POA: Diagnosis not present

## 2019-06-11 DIAGNOSIS — Z8673 Personal history of transient ischemic attack (TIA), and cerebral infarction without residual deficits: Secondary | ICD-10-CM | POA: Diagnosis not present

## 2019-06-11 DIAGNOSIS — I700 Atherosclerosis of aorta: Secondary | ICD-10-CM | POA: Diagnosis not present

## 2019-06-11 DIAGNOSIS — N3000 Acute cystitis without hematuria: Secondary | ICD-10-CM | POA: Diagnosis not present

## 2019-06-11 DIAGNOSIS — I129 Hypertensive chronic kidney disease with stage 1 through stage 4 chronic kidney disease, or unspecified chronic kidney disease: Secondary | ICD-10-CM | POA: Diagnosis not present

## 2019-06-13 DIAGNOSIS — L89310 Pressure ulcer of right buttock, unstageable: Secondary | ICD-10-CM | POA: Diagnosis not present

## 2019-06-13 DIAGNOSIS — Z9181 History of falling: Secondary | ICD-10-CM | POA: Diagnosis not present

## 2019-06-13 DIAGNOSIS — S91312D Laceration without foreign body, left foot, subsequent encounter: Secondary | ICD-10-CM | POA: Diagnosis not present

## 2019-06-13 DIAGNOSIS — I129 Hypertensive chronic kidney disease with stage 1 through stage 4 chronic kidney disease, or unspecified chronic kidney disease: Secondary | ICD-10-CM | POA: Diagnosis not present

## 2019-06-13 DIAGNOSIS — N183 Chronic kidney disease, stage 3 unspecified: Secondary | ICD-10-CM | POA: Diagnosis not present

## 2019-06-13 DIAGNOSIS — L89312 Pressure ulcer of right buttock, stage 2: Secondary | ICD-10-CM | POA: Diagnosis not present

## 2019-06-13 DIAGNOSIS — M1990 Unspecified osteoarthritis, unspecified site: Secondary | ICD-10-CM | POA: Diagnosis not present

## 2019-06-13 DIAGNOSIS — L89622 Pressure ulcer of left heel, stage 2: Secondary | ICD-10-CM | POA: Diagnosis not present

## 2019-06-13 DIAGNOSIS — L89610 Pressure ulcer of right heel, unstageable: Secondary | ICD-10-CM | POA: Diagnosis not present

## 2019-06-13 DIAGNOSIS — E785 Hyperlipidemia, unspecified: Secondary | ICD-10-CM | POA: Diagnosis not present

## 2019-06-13 DIAGNOSIS — N3000 Acute cystitis without hematuria: Secondary | ICD-10-CM | POA: Diagnosis not present

## 2019-06-13 DIAGNOSIS — Z8616 Personal history of COVID-19: Secondary | ICD-10-CM | POA: Diagnosis not present

## 2019-06-13 DIAGNOSIS — D631 Anemia in chronic kidney disease: Secondary | ICD-10-CM | POA: Diagnosis not present

## 2019-06-13 DIAGNOSIS — I700 Atherosclerosis of aorta: Secondary | ICD-10-CM | POA: Diagnosis not present

## 2019-06-13 DIAGNOSIS — B9561 Methicillin susceptible Staphylococcus aureus infection as the cause of diseases classified elsewhere: Secondary | ICD-10-CM | POA: Diagnosis not present

## 2019-06-13 DIAGNOSIS — S81011D Laceration without foreign body, right knee, subsequent encounter: Secondary | ICD-10-CM | POA: Diagnosis not present

## 2019-06-13 DIAGNOSIS — Z8673 Personal history of transient ischemic attack (TIA), and cerebral infarction without residual deficits: Secondary | ICD-10-CM | POA: Diagnosis not present

## 2019-06-14 DIAGNOSIS — Z9181 History of falling: Secondary | ICD-10-CM | POA: Diagnosis not present

## 2019-06-14 DIAGNOSIS — B9561 Methicillin susceptible Staphylococcus aureus infection as the cause of diseases classified elsewhere: Secondary | ICD-10-CM | POA: Diagnosis not present

## 2019-06-14 DIAGNOSIS — Z8673 Personal history of transient ischemic attack (TIA), and cerebral infarction without residual deficits: Secondary | ICD-10-CM | POA: Diagnosis not present

## 2019-06-14 DIAGNOSIS — N3000 Acute cystitis without hematuria: Secondary | ICD-10-CM | POA: Diagnosis not present

## 2019-06-14 DIAGNOSIS — N183 Chronic kidney disease, stage 3 unspecified: Secondary | ICD-10-CM | POA: Diagnosis not present

## 2019-06-14 DIAGNOSIS — E785 Hyperlipidemia, unspecified: Secondary | ICD-10-CM | POA: Diagnosis not present

## 2019-06-14 DIAGNOSIS — L89622 Pressure ulcer of left heel, stage 2: Secondary | ICD-10-CM | POA: Diagnosis not present

## 2019-06-14 DIAGNOSIS — L89312 Pressure ulcer of right buttock, stage 2: Secondary | ICD-10-CM | POA: Diagnosis not present

## 2019-06-14 DIAGNOSIS — I129 Hypertensive chronic kidney disease with stage 1 through stage 4 chronic kidney disease, or unspecified chronic kidney disease: Secondary | ICD-10-CM | POA: Diagnosis not present

## 2019-06-14 DIAGNOSIS — L89310 Pressure ulcer of right buttock, unstageable: Secondary | ICD-10-CM | POA: Diagnosis not present

## 2019-06-14 DIAGNOSIS — I700 Atherosclerosis of aorta: Secondary | ICD-10-CM | POA: Diagnosis not present

## 2019-06-14 DIAGNOSIS — Z8616 Personal history of COVID-19: Secondary | ICD-10-CM | POA: Diagnosis not present

## 2019-06-14 DIAGNOSIS — L89610 Pressure ulcer of right heel, unstageable: Secondary | ICD-10-CM | POA: Diagnosis not present

## 2019-06-14 DIAGNOSIS — D631 Anemia in chronic kidney disease: Secondary | ICD-10-CM | POA: Diagnosis not present

## 2019-06-14 DIAGNOSIS — S91312D Laceration without foreign body, left foot, subsequent encounter: Secondary | ICD-10-CM | POA: Diagnosis not present

## 2019-06-14 DIAGNOSIS — S81011D Laceration without foreign body, right knee, subsequent encounter: Secondary | ICD-10-CM | POA: Diagnosis not present

## 2019-06-15 DIAGNOSIS — N3000 Acute cystitis without hematuria: Secondary | ICD-10-CM | POA: Diagnosis not present

## 2019-06-15 DIAGNOSIS — I129 Hypertensive chronic kidney disease with stage 1 through stage 4 chronic kidney disease, or unspecified chronic kidney disease: Secondary | ICD-10-CM | POA: Diagnosis not present

## 2019-06-15 DIAGNOSIS — Z8616 Personal history of COVID-19: Secondary | ICD-10-CM | POA: Diagnosis not present

## 2019-06-15 DIAGNOSIS — N183 Chronic kidney disease, stage 3 unspecified: Secondary | ICD-10-CM | POA: Diagnosis not present

## 2019-06-15 DIAGNOSIS — D631 Anemia in chronic kidney disease: Secondary | ICD-10-CM | POA: Diagnosis not present

## 2019-06-15 DIAGNOSIS — S81011D Laceration without foreign body, right knee, subsequent encounter: Secondary | ICD-10-CM | POA: Diagnosis not present

## 2019-06-15 DIAGNOSIS — L89310 Pressure ulcer of right buttock, unstageable: Secondary | ICD-10-CM | POA: Diagnosis not present

## 2019-06-15 DIAGNOSIS — L89610 Pressure ulcer of right heel, unstageable: Secondary | ICD-10-CM | POA: Diagnosis not present

## 2019-06-15 DIAGNOSIS — L89312 Pressure ulcer of right buttock, stage 2: Secondary | ICD-10-CM | POA: Diagnosis not present

## 2019-06-15 DIAGNOSIS — Z9181 History of falling: Secondary | ICD-10-CM | POA: Diagnosis not present

## 2019-06-15 DIAGNOSIS — S91312D Laceration without foreign body, left foot, subsequent encounter: Secondary | ICD-10-CM | POA: Diagnosis not present

## 2019-06-15 DIAGNOSIS — Z8673 Personal history of transient ischemic attack (TIA), and cerebral infarction without residual deficits: Secondary | ICD-10-CM | POA: Diagnosis not present

## 2019-06-15 DIAGNOSIS — B9561 Methicillin susceptible Staphylococcus aureus infection as the cause of diseases classified elsewhere: Secondary | ICD-10-CM | POA: Diagnosis not present

## 2019-06-15 DIAGNOSIS — L89622 Pressure ulcer of left heel, stage 2: Secondary | ICD-10-CM | POA: Diagnosis not present

## 2019-06-15 DIAGNOSIS — E785 Hyperlipidemia, unspecified: Secondary | ICD-10-CM | POA: Diagnosis not present

## 2019-06-15 DIAGNOSIS — I700 Atherosclerosis of aorta: Secondary | ICD-10-CM | POA: Diagnosis not present

## 2019-06-17 DIAGNOSIS — Z8673 Personal history of transient ischemic attack (TIA), and cerebral infarction without residual deficits: Secondary | ICD-10-CM | POA: Diagnosis not present

## 2019-06-17 DIAGNOSIS — Z8616 Personal history of COVID-19: Secondary | ICD-10-CM | POA: Diagnosis not present

## 2019-06-17 DIAGNOSIS — I129 Hypertensive chronic kidney disease with stage 1 through stage 4 chronic kidney disease, or unspecified chronic kidney disease: Secondary | ICD-10-CM | POA: Diagnosis not present

## 2019-06-17 DIAGNOSIS — D631 Anemia in chronic kidney disease: Secondary | ICD-10-CM | POA: Diagnosis not present

## 2019-06-17 DIAGNOSIS — L89622 Pressure ulcer of left heel, stage 2: Secondary | ICD-10-CM | POA: Diagnosis not present

## 2019-06-17 DIAGNOSIS — B9561 Methicillin susceptible Staphylococcus aureus infection as the cause of diseases classified elsewhere: Secondary | ICD-10-CM | POA: Diagnosis not present

## 2019-06-17 DIAGNOSIS — L89310 Pressure ulcer of right buttock, unstageable: Secondary | ICD-10-CM | POA: Diagnosis not present

## 2019-06-17 DIAGNOSIS — S81011D Laceration without foreign body, right knee, subsequent encounter: Secondary | ICD-10-CM | POA: Diagnosis not present

## 2019-06-17 DIAGNOSIS — N3000 Acute cystitis without hematuria: Secondary | ICD-10-CM | POA: Diagnosis not present

## 2019-06-17 DIAGNOSIS — I700 Atherosclerosis of aorta: Secondary | ICD-10-CM | POA: Diagnosis not present

## 2019-06-17 DIAGNOSIS — Z9181 History of falling: Secondary | ICD-10-CM | POA: Diagnosis not present

## 2019-06-17 DIAGNOSIS — S91312D Laceration without foreign body, left foot, subsequent encounter: Secondary | ICD-10-CM | POA: Diagnosis not present

## 2019-06-17 DIAGNOSIS — L89610 Pressure ulcer of right heel, unstageable: Secondary | ICD-10-CM | POA: Diagnosis not present

## 2019-06-17 DIAGNOSIS — N183 Chronic kidney disease, stage 3 unspecified: Secondary | ICD-10-CM | POA: Diagnosis not present

## 2019-06-17 DIAGNOSIS — E785 Hyperlipidemia, unspecified: Secondary | ICD-10-CM | POA: Diagnosis not present

## 2019-06-17 DIAGNOSIS — L89312 Pressure ulcer of right buttock, stage 2: Secondary | ICD-10-CM | POA: Diagnosis not present

## 2019-06-20 DIAGNOSIS — Z8616 Personal history of COVID-19: Secondary | ICD-10-CM | POA: Diagnosis not present

## 2019-06-20 DIAGNOSIS — I700 Atherosclerosis of aorta: Secondary | ICD-10-CM | POA: Diagnosis not present

## 2019-06-20 DIAGNOSIS — D631 Anemia in chronic kidney disease: Secondary | ICD-10-CM | POA: Diagnosis not present

## 2019-06-20 DIAGNOSIS — L89310 Pressure ulcer of right buttock, unstageable: Secondary | ICD-10-CM | POA: Diagnosis not present

## 2019-06-20 DIAGNOSIS — N3000 Acute cystitis without hematuria: Secondary | ICD-10-CM | POA: Diagnosis not present

## 2019-06-20 DIAGNOSIS — I129 Hypertensive chronic kidney disease with stage 1 through stage 4 chronic kidney disease, or unspecified chronic kidney disease: Secondary | ICD-10-CM | POA: Diagnosis not present

## 2019-06-20 DIAGNOSIS — Z9181 History of falling: Secondary | ICD-10-CM | POA: Diagnosis not present

## 2019-06-20 DIAGNOSIS — S81011D Laceration without foreign body, right knee, subsequent encounter: Secondary | ICD-10-CM | POA: Diagnosis not present

## 2019-06-20 DIAGNOSIS — Z8673 Personal history of transient ischemic attack (TIA), and cerebral infarction without residual deficits: Secondary | ICD-10-CM | POA: Diagnosis not present

## 2019-06-20 DIAGNOSIS — B9561 Methicillin susceptible Staphylococcus aureus infection as the cause of diseases classified elsewhere: Secondary | ICD-10-CM | POA: Diagnosis not present

## 2019-06-20 DIAGNOSIS — S91312D Laceration without foreign body, left foot, subsequent encounter: Secondary | ICD-10-CM | POA: Diagnosis not present

## 2019-06-20 DIAGNOSIS — L89312 Pressure ulcer of right buttock, stage 2: Secondary | ICD-10-CM | POA: Diagnosis not present

## 2019-06-20 DIAGNOSIS — E785 Hyperlipidemia, unspecified: Secondary | ICD-10-CM | POA: Diagnosis not present

## 2019-06-20 DIAGNOSIS — L89622 Pressure ulcer of left heel, stage 2: Secondary | ICD-10-CM | POA: Diagnosis not present

## 2019-06-20 DIAGNOSIS — L89610 Pressure ulcer of right heel, unstageable: Secondary | ICD-10-CM | POA: Diagnosis not present

## 2019-06-20 DIAGNOSIS — N183 Chronic kidney disease, stage 3 unspecified: Secondary | ICD-10-CM | POA: Diagnosis not present

## 2019-06-21 DIAGNOSIS — N183 Chronic kidney disease, stage 3 unspecified: Secondary | ICD-10-CM | POA: Diagnosis not present

## 2019-06-21 DIAGNOSIS — E785 Hyperlipidemia, unspecified: Secondary | ICD-10-CM | POA: Diagnosis not present

## 2019-06-21 DIAGNOSIS — L89622 Pressure ulcer of left heel, stage 2: Secondary | ICD-10-CM | POA: Diagnosis not present

## 2019-06-21 DIAGNOSIS — D631 Anemia in chronic kidney disease: Secondary | ICD-10-CM | POA: Diagnosis not present

## 2019-06-21 DIAGNOSIS — L89310 Pressure ulcer of right buttock, unstageable: Secondary | ICD-10-CM | POA: Diagnosis not present

## 2019-06-21 DIAGNOSIS — S91312D Laceration without foreign body, left foot, subsequent encounter: Secondary | ICD-10-CM | POA: Diagnosis not present

## 2019-06-21 DIAGNOSIS — Z8616 Personal history of COVID-19: Secondary | ICD-10-CM | POA: Diagnosis not present

## 2019-06-21 DIAGNOSIS — S81011D Laceration without foreign body, right knee, subsequent encounter: Secondary | ICD-10-CM | POA: Diagnosis not present

## 2019-06-21 DIAGNOSIS — I700 Atherosclerosis of aorta: Secondary | ICD-10-CM | POA: Diagnosis not present

## 2019-06-21 DIAGNOSIS — I129 Hypertensive chronic kidney disease with stage 1 through stage 4 chronic kidney disease, or unspecified chronic kidney disease: Secondary | ICD-10-CM | POA: Diagnosis not present

## 2019-06-21 DIAGNOSIS — L89312 Pressure ulcer of right buttock, stage 2: Secondary | ICD-10-CM | POA: Diagnosis not present

## 2019-06-21 DIAGNOSIS — L89610 Pressure ulcer of right heel, unstageable: Secondary | ICD-10-CM | POA: Diagnosis not present

## 2019-06-21 DIAGNOSIS — Z9181 History of falling: Secondary | ICD-10-CM | POA: Diagnosis not present

## 2019-06-21 DIAGNOSIS — N3000 Acute cystitis without hematuria: Secondary | ICD-10-CM | POA: Diagnosis not present

## 2019-06-21 DIAGNOSIS — Z8673 Personal history of transient ischemic attack (TIA), and cerebral infarction without residual deficits: Secondary | ICD-10-CM | POA: Diagnosis not present

## 2019-06-21 DIAGNOSIS — B9561 Methicillin susceptible Staphylococcus aureus infection as the cause of diseases classified elsewhere: Secondary | ICD-10-CM | POA: Diagnosis not present

## 2019-06-22 DIAGNOSIS — Z8673 Personal history of transient ischemic attack (TIA), and cerebral infarction without residual deficits: Secondary | ICD-10-CM | POA: Diagnosis not present

## 2019-06-22 DIAGNOSIS — B9561 Methicillin susceptible Staphylococcus aureus infection as the cause of diseases classified elsewhere: Secondary | ICD-10-CM | POA: Diagnosis not present

## 2019-06-22 DIAGNOSIS — L89312 Pressure ulcer of right buttock, stage 2: Secondary | ICD-10-CM | POA: Diagnosis not present

## 2019-06-22 DIAGNOSIS — Z9181 History of falling: Secondary | ICD-10-CM | POA: Diagnosis not present

## 2019-06-22 DIAGNOSIS — L89622 Pressure ulcer of left heel, stage 2: Secondary | ICD-10-CM | POA: Diagnosis not present

## 2019-06-22 DIAGNOSIS — D631 Anemia in chronic kidney disease: Secondary | ICD-10-CM | POA: Diagnosis not present

## 2019-06-22 DIAGNOSIS — S81011D Laceration without foreign body, right knee, subsequent encounter: Secondary | ICD-10-CM | POA: Diagnosis not present

## 2019-06-22 DIAGNOSIS — L89610 Pressure ulcer of right heel, unstageable: Secondary | ICD-10-CM | POA: Diagnosis not present

## 2019-06-22 DIAGNOSIS — I700 Atherosclerosis of aorta: Secondary | ICD-10-CM | POA: Diagnosis not present

## 2019-06-22 DIAGNOSIS — N3000 Acute cystitis without hematuria: Secondary | ICD-10-CM | POA: Diagnosis not present

## 2019-06-22 DIAGNOSIS — N183 Chronic kidney disease, stage 3 unspecified: Secondary | ICD-10-CM | POA: Diagnosis not present

## 2019-06-22 DIAGNOSIS — E785 Hyperlipidemia, unspecified: Secondary | ICD-10-CM | POA: Diagnosis not present

## 2019-06-22 DIAGNOSIS — L89310 Pressure ulcer of right buttock, unstageable: Secondary | ICD-10-CM | POA: Diagnosis not present

## 2019-06-22 DIAGNOSIS — S91312D Laceration without foreign body, left foot, subsequent encounter: Secondary | ICD-10-CM | POA: Diagnosis not present

## 2019-06-22 DIAGNOSIS — Z8616 Personal history of COVID-19: Secondary | ICD-10-CM | POA: Diagnosis not present

## 2019-06-22 DIAGNOSIS — I129 Hypertensive chronic kidney disease with stage 1 through stage 4 chronic kidney disease, or unspecified chronic kidney disease: Secondary | ICD-10-CM | POA: Diagnosis not present

## 2019-06-23 ENCOUNTER — Other Ambulatory Visit: Payer: Self-pay

## 2019-06-23 NOTE — Patient Outreach (Signed)
[facility]) Care Management  06/23/2019  Manuel King Jul 28, 1929 [id number]   Medication Adherence call to Manuel King Telephone call to Patient regarding Medication Adherence unable to reach patient. Manuel King is showing past due on Irbesartan 150 mg under [place].  [place] Management Direct Dial [phone]  Fax [phone] [email or web address]

## 2019-06-24 DIAGNOSIS — E785 Hyperlipidemia, unspecified: Secondary | ICD-10-CM | POA: Diagnosis not present

## 2019-06-24 DIAGNOSIS — L89622 Pressure ulcer of left heel, stage 2: Secondary | ICD-10-CM | POA: Diagnosis not present

## 2019-06-24 DIAGNOSIS — I700 Atherosclerosis of aorta: Secondary | ICD-10-CM | POA: Diagnosis not present

## 2019-06-24 DIAGNOSIS — Z9181 History of falling: Secondary | ICD-10-CM | POA: Diagnosis not present

## 2019-06-24 DIAGNOSIS — D631 Anemia in chronic kidney disease: Secondary | ICD-10-CM | POA: Diagnosis not present

## 2019-06-24 DIAGNOSIS — L89610 Pressure ulcer of right heel, unstageable: Secondary | ICD-10-CM | POA: Diagnosis not present

## 2019-06-24 DIAGNOSIS — Z8673 Personal history of transient ischemic attack (TIA), and cerebral infarction without residual deficits: Secondary | ICD-10-CM | POA: Diagnosis not present

## 2019-06-24 DIAGNOSIS — S81011D Laceration without foreign body, right knee, subsequent encounter: Secondary | ICD-10-CM | POA: Diagnosis not present

## 2019-06-24 DIAGNOSIS — N3000 Acute cystitis without hematuria: Secondary | ICD-10-CM | POA: Diagnosis not present

## 2019-06-24 DIAGNOSIS — I129 Hypertensive chronic kidney disease with stage 1 through stage 4 chronic kidney disease, or unspecified chronic kidney disease: Secondary | ICD-10-CM | POA: Diagnosis not present

## 2019-06-24 DIAGNOSIS — L89312 Pressure ulcer of right buttock, stage 2: Secondary | ICD-10-CM | POA: Diagnosis not present

## 2019-06-24 DIAGNOSIS — B9561 Methicillin susceptible Staphylococcus aureus infection as the cause of diseases classified elsewhere: Secondary | ICD-10-CM | POA: Diagnosis not present

## 2019-06-24 DIAGNOSIS — S91312D Laceration without foreign body, left foot, subsequent encounter: Secondary | ICD-10-CM | POA: Diagnosis not present

## 2019-06-24 DIAGNOSIS — Z8616 Personal history of COVID-19: Secondary | ICD-10-CM | POA: Diagnosis not present

## 2019-06-24 DIAGNOSIS — L89310 Pressure ulcer of right buttock, unstageable: Secondary | ICD-10-CM | POA: Diagnosis not present

## 2019-06-24 DIAGNOSIS — N183 Chronic kidney disease, stage 3 unspecified: Secondary | ICD-10-CM | POA: Diagnosis not present

## 2019-06-27 DIAGNOSIS — Z8673 Personal history of transient ischemic attack (TIA), and cerebral infarction without residual deficits: Secondary | ICD-10-CM | POA: Diagnosis not present

## 2019-06-27 DIAGNOSIS — Z9181 History of falling: Secondary | ICD-10-CM | POA: Diagnosis not present

## 2019-06-27 DIAGNOSIS — L89610 Pressure ulcer of right heel, unstageable: Secondary | ICD-10-CM | POA: Diagnosis not present

## 2019-06-27 DIAGNOSIS — N3000 Acute cystitis without hematuria: Secondary | ICD-10-CM | POA: Diagnosis not present

## 2019-06-27 DIAGNOSIS — L89310 Pressure ulcer of right buttock, unstageable: Secondary | ICD-10-CM | POA: Diagnosis not present

## 2019-06-27 DIAGNOSIS — I700 Atherosclerosis of aorta: Secondary | ICD-10-CM | POA: Diagnosis not present

## 2019-06-27 DIAGNOSIS — D631 Anemia in chronic kidney disease: Secondary | ICD-10-CM | POA: Diagnosis not present

## 2019-06-27 DIAGNOSIS — I129 Hypertensive chronic kidney disease with stage 1 through stage 4 chronic kidney disease, or unspecified chronic kidney disease: Secondary | ICD-10-CM | POA: Diagnosis not present

## 2019-06-27 DIAGNOSIS — S81011D Laceration without foreign body, right knee, subsequent encounter: Secondary | ICD-10-CM | POA: Diagnosis not present

## 2019-06-27 DIAGNOSIS — E785 Hyperlipidemia, unspecified: Secondary | ICD-10-CM | POA: Diagnosis not present

## 2019-06-27 DIAGNOSIS — B9561 Methicillin susceptible Staphylococcus aureus infection as the cause of diseases classified elsewhere: Secondary | ICD-10-CM | POA: Diagnosis not present

## 2019-06-27 DIAGNOSIS — S91312D Laceration without foreign body, left foot, subsequent encounter: Secondary | ICD-10-CM | POA: Diagnosis not present

## 2019-06-27 DIAGNOSIS — L89622 Pressure ulcer of left heel, stage 2: Secondary | ICD-10-CM | POA: Diagnosis not present

## 2019-06-27 DIAGNOSIS — Z8616 Personal history of COVID-19: Secondary | ICD-10-CM | POA: Diagnosis not present

## 2019-06-27 DIAGNOSIS — L89312 Pressure ulcer of right buttock, stage 2: Secondary | ICD-10-CM | POA: Diagnosis not present

## 2019-06-27 DIAGNOSIS — N183 Chronic kidney disease, stage 3 unspecified: Secondary | ICD-10-CM | POA: Diagnosis not present

## 2019-06-28 DIAGNOSIS — L89312 Pressure ulcer of right buttock, stage 2: Secondary | ICD-10-CM | POA: Diagnosis not present

## 2019-06-28 DIAGNOSIS — I129 Hypertensive chronic kidney disease with stage 1 through stage 4 chronic kidney disease, or unspecified chronic kidney disease: Secondary | ICD-10-CM | POA: Diagnosis not present

## 2019-06-28 DIAGNOSIS — B9561 Methicillin susceptible Staphylococcus aureus infection as the cause of diseases classified elsewhere: Secondary | ICD-10-CM | POA: Diagnosis not present

## 2019-06-28 DIAGNOSIS — S81011D Laceration without foreign body, right knee, subsequent encounter: Secondary | ICD-10-CM | POA: Diagnosis not present

## 2019-06-28 DIAGNOSIS — E785 Hyperlipidemia, unspecified: Secondary | ICD-10-CM | POA: Diagnosis not present

## 2019-06-28 DIAGNOSIS — Z8616 Personal history of COVID-19: Secondary | ICD-10-CM | POA: Diagnosis not present

## 2019-06-28 DIAGNOSIS — N3000 Acute cystitis without hematuria: Secondary | ICD-10-CM | POA: Diagnosis not present

## 2019-06-28 DIAGNOSIS — L89622 Pressure ulcer of left heel, stage 2: Secondary | ICD-10-CM | POA: Diagnosis not present

## 2019-06-28 DIAGNOSIS — L89310 Pressure ulcer of right buttock, unstageable: Secondary | ICD-10-CM | POA: Diagnosis not present

## 2019-06-28 DIAGNOSIS — S91312D Laceration without foreign body, left foot, subsequent encounter: Secondary | ICD-10-CM | POA: Diagnosis not present

## 2019-06-28 DIAGNOSIS — Z9181 History of falling: Secondary | ICD-10-CM | POA: Diagnosis not present

## 2019-06-28 DIAGNOSIS — L89610 Pressure ulcer of right heel, unstageable: Secondary | ICD-10-CM | POA: Diagnosis not present

## 2019-06-28 DIAGNOSIS — Z8673 Personal history of transient ischemic attack (TIA), and cerebral infarction without residual deficits: Secondary | ICD-10-CM | POA: Diagnosis not present

## 2019-06-28 DIAGNOSIS — N183 Chronic kidney disease, stage 3 unspecified: Secondary | ICD-10-CM | POA: Diagnosis not present

## 2019-06-28 DIAGNOSIS — I700 Atherosclerosis of aorta: Secondary | ICD-10-CM | POA: Diagnosis not present

## 2019-06-28 DIAGNOSIS — D631 Anemia in chronic kidney disease: Secondary | ICD-10-CM | POA: Diagnosis not present

## 2019-06-29 DIAGNOSIS — N183 Chronic kidney disease, stage 3 unspecified: Secondary | ICD-10-CM | POA: Diagnosis not present

## 2019-06-29 DIAGNOSIS — I700 Atherosclerosis of aorta: Secondary | ICD-10-CM | POA: Diagnosis not present

## 2019-06-29 DIAGNOSIS — Z8673 Personal history of transient ischemic attack (TIA), and cerebral infarction without residual deficits: Secondary | ICD-10-CM | POA: Diagnosis not present

## 2019-06-29 DIAGNOSIS — S81011D Laceration without foreign body, right knee, subsequent encounter: Secondary | ICD-10-CM | POA: Diagnosis not present

## 2019-06-29 DIAGNOSIS — L89622 Pressure ulcer of left heel, stage 2: Secondary | ICD-10-CM | POA: Diagnosis not present

## 2019-06-29 DIAGNOSIS — N3000 Acute cystitis without hematuria: Secondary | ICD-10-CM | POA: Diagnosis not present

## 2019-06-29 DIAGNOSIS — E785 Hyperlipidemia, unspecified: Secondary | ICD-10-CM | POA: Diagnosis not present

## 2019-06-29 DIAGNOSIS — Z8616 Personal history of COVID-19: Secondary | ICD-10-CM | POA: Diagnosis not present

## 2019-06-29 DIAGNOSIS — L89610 Pressure ulcer of right heel, unstageable: Secondary | ICD-10-CM | POA: Diagnosis not present

## 2019-06-29 DIAGNOSIS — L89310 Pressure ulcer of right buttock, unstageable: Secondary | ICD-10-CM | POA: Diagnosis not present

## 2019-06-29 DIAGNOSIS — L89312 Pressure ulcer of right buttock, stage 2: Secondary | ICD-10-CM | POA: Diagnosis not present

## 2019-06-29 DIAGNOSIS — I129 Hypertensive chronic kidney disease with stage 1 through stage 4 chronic kidney disease, or unspecified chronic kidney disease: Secondary | ICD-10-CM | POA: Diagnosis not present

## 2019-06-29 DIAGNOSIS — S91312D Laceration without foreign body, left foot, subsequent encounter: Secondary | ICD-10-CM | POA: Diagnosis not present

## 2019-06-29 DIAGNOSIS — B9561 Methicillin susceptible Staphylococcus aureus infection as the cause of diseases classified elsewhere: Secondary | ICD-10-CM | POA: Diagnosis not present

## 2019-06-29 DIAGNOSIS — Z9181 History of falling: Secondary | ICD-10-CM | POA: Diagnosis not present

## 2019-06-29 DIAGNOSIS — D631 Anemia in chronic kidney disease: Secondary | ICD-10-CM | POA: Diagnosis not present

## 2019-07-01 DIAGNOSIS — L89622 Pressure ulcer of left heel, stage 2: Secondary | ICD-10-CM | POA: Diagnosis not present

## 2019-07-01 DIAGNOSIS — N183 Chronic kidney disease, stage 3 unspecified: Secondary | ICD-10-CM | POA: Diagnosis not present

## 2019-07-01 DIAGNOSIS — Z8673 Personal history of transient ischemic attack (TIA), and cerebral infarction without residual deficits: Secondary | ICD-10-CM | POA: Diagnosis not present

## 2019-07-01 DIAGNOSIS — I129 Hypertensive chronic kidney disease with stage 1 through stage 4 chronic kidney disease, or unspecified chronic kidney disease: Secondary | ICD-10-CM | POA: Diagnosis not present

## 2019-07-01 DIAGNOSIS — Z9181 History of falling: Secondary | ICD-10-CM | POA: Diagnosis not present

## 2019-07-01 DIAGNOSIS — D631 Anemia in chronic kidney disease: Secondary | ICD-10-CM | POA: Diagnosis not present

## 2019-07-01 DIAGNOSIS — B9561 Methicillin susceptible Staphylococcus aureus infection as the cause of diseases classified elsewhere: Secondary | ICD-10-CM | POA: Diagnosis not present

## 2019-07-01 DIAGNOSIS — L89310 Pressure ulcer of right buttock, unstageable: Secondary | ICD-10-CM | POA: Diagnosis not present

## 2019-07-01 DIAGNOSIS — Z8616 Personal history of COVID-19: Secondary | ICD-10-CM | POA: Diagnosis not present

## 2019-07-01 DIAGNOSIS — I700 Atherosclerosis of aorta: Secondary | ICD-10-CM | POA: Diagnosis not present

## 2019-07-01 DIAGNOSIS — L89312 Pressure ulcer of right buttock, stage 2: Secondary | ICD-10-CM | POA: Diagnosis not present

## 2019-07-01 DIAGNOSIS — N3000 Acute cystitis without hematuria: Secondary | ICD-10-CM | POA: Diagnosis not present

## 2019-07-01 DIAGNOSIS — S81011D Laceration without foreign body, right knee, subsequent encounter: Secondary | ICD-10-CM | POA: Diagnosis not present

## 2019-07-01 DIAGNOSIS — E785 Hyperlipidemia, unspecified: Secondary | ICD-10-CM | POA: Diagnosis not present

## 2019-07-01 DIAGNOSIS — S91312D Laceration without foreign body, left foot, subsequent encounter: Secondary | ICD-10-CM | POA: Diagnosis not present

## 2019-07-01 DIAGNOSIS — L89610 Pressure ulcer of right heel, unstageable: Secondary | ICD-10-CM | POA: Diagnosis not present

## 2019-07-04 DIAGNOSIS — E785 Hyperlipidemia, unspecified: Secondary | ICD-10-CM | POA: Diagnosis not present

## 2019-07-04 DIAGNOSIS — I10 Essential (primary) hypertension: Secondary | ICD-10-CM | POA: Diagnosis not present

## 2019-07-04 DIAGNOSIS — M109 Gout, unspecified: Secondary | ICD-10-CM | POA: Diagnosis not present

## 2019-07-04 DIAGNOSIS — L89312 Pressure ulcer of right buttock, stage 2: Secondary | ICD-10-CM | POA: Diagnosis not present

## 2019-07-04 DIAGNOSIS — B9561 Methicillin susceptible Staphylococcus aureus infection as the cause of diseases classified elsewhere: Secondary | ICD-10-CM | POA: Diagnosis not present

## 2019-07-04 DIAGNOSIS — S81011D Laceration without foreign body, right knee, subsequent encounter: Secondary | ICD-10-CM | POA: Diagnosis not present

## 2019-07-04 DIAGNOSIS — Z9181 History of falling: Secondary | ICD-10-CM | POA: Diagnosis not present

## 2019-07-04 DIAGNOSIS — N183 Chronic kidney disease, stage 3 unspecified: Secondary | ICD-10-CM | POA: Diagnosis not present

## 2019-07-04 DIAGNOSIS — R269 Unspecified abnormalities of gait and mobility: Secondary | ICD-10-CM | POA: Diagnosis not present

## 2019-07-04 DIAGNOSIS — D631 Anemia in chronic kidney disease: Secondary | ICD-10-CM | POA: Diagnosis not present

## 2019-07-04 DIAGNOSIS — Z8673 Personal history of transient ischemic attack (TIA), and cerebral infarction without residual deficits: Secondary | ICD-10-CM | POA: Diagnosis not present

## 2019-07-04 DIAGNOSIS — L89622 Pressure ulcer of left heel, stage 2: Secondary | ICD-10-CM | POA: Diagnosis not present

## 2019-07-04 DIAGNOSIS — S91312D Laceration without foreign body, left foot, subsequent encounter: Secondary | ICD-10-CM | POA: Diagnosis not present

## 2019-07-04 DIAGNOSIS — L89310 Pressure ulcer of right buttock, unstageable: Secondary | ICD-10-CM | POA: Diagnosis not present

## 2019-07-04 DIAGNOSIS — N3000 Acute cystitis without hematuria: Secondary | ICD-10-CM | POA: Diagnosis not present

## 2019-07-04 DIAGNOSIS — I700 Atherosclerosis of aorta: Secondary | ICD-10-CM | POA: Diagnosis not present

## 2019-07-04 DIAGNOSIS — Z8616 Personal history of COVID-19: Secondary | ICD-10-CM | POA: Diagnosis not present

## 2019-07-04 DIAGNOSIS — I129 Hypertensive chronic kidney disease with stage 1 through stage 4 chronic kidney disease, or unspecified chronic kidney disease: Secondary | ICD-10-CM | POA: Diagnosis not present

## 2019-07-04 DIAGNOSIS — L89610 Pressure ulcer of right heel, unstageable: Secondary | ICD-10-CM | POA: Diagnosis not present

## 2019-07-05 DIAGNOSIS — L89312 Pressure ulcer of right buttock, stage 2: Secondary | ICD-10-CM | POA: Diagnosis not present

## 2019-07-05 DIAGNOSIS — L89622 Pressure ulcer of left heel, stage 2: Secondary | ICD-10-CM | POA: Diagnosis not present

## 2019-07-05 DIAGNOSIS — I700 Atherosclerosis of aorta: Secondary | ICD-10-CM | POA: Diagnosis not present

## 2019-07-05 DIAGNOSIS — L89610 Pressure ulcer of right heel, unstageable: Secondary | ICD-10-CM | POA: Diagnosis not present

## 2019-07-05 DIAGNOSIS — Z9181 History of falling: Secondary | ICD-10-CM | POA: Diagnosis not present

## 2019-07-05 DIAGNOSIS — D631 Anemia in chronic kidney disease: Secondary | ICD-10-CM | POA: Diagnosis not present

## 2019-07-05 DIAGNOSIS — L89310 Pressure ulcer of right buttock, unstageable: Secondary | ICD-10-CM | POA: Diagnosis not present

## 2019-07-05 DIAGNOSIS — Z8673 Personal history of transient ischemic attack (TIA), and cerebral infarction without residual deficits: Secondary | ICD-10-CM | POA: Diagnosis not present

## 2019-07-05 DIAGNOSIS — B9561 Methicillin susceptible Staphylococcus aureus infection as the cause of diseases classified elsewhere: Secondary | ICD-10-CM | POA: Diagnosis not present

## 2019-07-05 DIAGNOSIS — Z8616 Personal history of COVID-19: Secondary | ICD-10-CM | POA: Diagnosis not present

## 2019-07-05 DIAGNOSIS — S91312D Laceration without foreign body, left foot, subsequent encounter: Secondary | ICD-10-CM | POA: Diagnosis not present

## 2019-07-05 DIAGNOSIS — N3000 Acute cystitis without hematuria: Secondary | ICD-10-CM | POA: Diagnosis not present

## 2019-07-05 DIAGNOSIS — I129 Hypertensive chronic kidney disease with stage 1 through stage 4 chronic kidney disease, or unspecified chronic kidney disease: Secondary | ICD-10-CM | POA: Diagnosis not present

## 2019-07-05 DIAGNOSIS — S81011D Laceration without foreign body, right knee, subsequent encounter: Secondary | ICD-10-CM | POA: Diagnosis not present

## 2019-07-05 DIAGNOSIS — E785 Hyperlipidemia, unspecified: Secondary | ICD-10-CM | POA: Diagnosis not present

## 2019-07-05 DIAGNOSIS — N183 Chronic kidney disease, stage 3 unspecified: Secondary | ICD-10-CM | POA: Diagnosis not present

## 2019-07-06 DIAGNOSIS — S81011D Laceration without foreign body, right knee, subsequent encounter: Secondary | ICD-10-CM | POA: Diagnosis not present

## 2019-07-06 DIAGNOSIS — D631 Anemia in chronic kidney disease: Secondary | ICD-10-CM | POA: Diagnosis not present

## 2019-07-06 DIAGNOSIS — L89610 Pressure ulcer of right heel, unstageable: Secondary | ICD-10-CM | POA: Diagnosis not present

## 2019-07-06 DIAGNOSIS — I129 Hypertensive chronic kidney disease with stage 1 through stage 4 chronic kidney disease, or unspecified chronic kidney disease: Secondary | ICD-10-CM | POA: Diagnosis not present

## 2019-07-06 DIAGNOSIS — N3000 Acute cystitis without hematuria: Secondary | ICD-10-CM | POA: Diagnosis not present

## 2019-07-06 DIAGNOSIS — I700 Atherosclerosis of aorta: Secondary | ICD-10-CM | POA: Diagnosis not present

## 2019-07-06 DIAGNOSIS — Z8616 Personal history of COVID-19: Secondary | ICD-10-CM | POA: Diagnosis not present

## 2019-07-06 DIAGNOSIS — L89312 Pressure ulcer of right buttock, stage 2: Secondary | ICD-10-CM | POA: Diagnosis not present

## 2019-07-06 DIAGNOSIS — N183 Chronic kidney disease, stage 3 unspecified: Secondary | ICD-10-CM | POA: Diagnosis not present

## 2019-07-06 DIAGNOSIS — Z9181 History of falling: Secondary | ICD-10-CM | POA: Diagnosis not present

## 2019-07-06 DIAGNOSIS — B9561 Methicillin susceptible Staphylococcus aureus infection as the cause of diseases classified elsewhere: Secondary | ICD-10-CM | POA: Diagnosis not present

## 2019-07-06 DIAGNOSIS — L89622 Pressure ulcer of left heel, stage 2: Secondary | ICD-10-CM | POA: Diagnosis not present

## 2019-07-06 DIAGNOSIS — E785 Hyperlipidemia, unspecified: Secondary | ICD-10-CM | POA: Diagnosis not present

## 2019-07-06 DIAGNOSIS — S91312D Laceration without foreign body, left foot, subsequent encounter: Secondary | ICD-10-CM | POA: Diagnosis not present

## 2019-07-06 DIAGNOSIS — L89310 Pressure ulcer of right buttock, unstageable: Secondary | ICD-10-CM | POA: Diagnosis not present

## 2019-07-06 DIAGNOSIS — Z8673 Personal history of transient ischemic attack (TIA), and cerebral infarction without residual deficits: Secondary | ICD-10-CM | POA: Diagnosis not present

## 2019-07-08 DIAGNOSIS — Z9181 History of falling: Secondary | ICD-10-CM | POA: Diagnosis not present

## 2019-07-08 DIAGNOSIS — L89622 Pressure ulcer of left heel, stage 2: Secondary | ICD-10-CM | POA: Diagnosis not present

## 2019-07-08 DIAGNOSIS — E785 Hyperlipidemia, unspecified: Secondary | ICD-10-CM | POA: Diagnosis not present

## 2019-07-08 DIAGNOSIS — I129 Hypertensive chronic kidney disease with stage 1 through stage 4 chronic kidney disease, or unspecified chronic kidney disease: Secondary | ICD-10-CM | POA: Diagnosis not present

## 2019-07-08 DIAGNOSIS — L89310 Pressure ulcer of right buttock, unstageable: Secondary | ICD-10-CM | POA: Diagnosis not present

## 2019-07-08 DIAGNOSIS — D631 Anemia in chronic kidney disease: Secondary | ICD-10-CM | POA: Diagnosis not present

## 2019-07-08 DIAGNOSIS — S91312D Laceration without foreign body, left foot, subsequent encounter: Secondary | ICD-10-CM | POA: Diagnosis not present

## 2019-07-08 DIAGNOSIS — I700 Atherosclerosis of aorta: Secondary | ICD-10-CM | POA: Diagnosis not present

## 2019-07-08 DIAGNOSIS — L89312 Pressure ulcer of right buttock, stage 2: Secondary | ICD-10-CM | POA: Diagnosis not present

## 2019-07-08 DIAGNOSIS — N183 Chronic kidney disease, stage 3 unspecified: Secondary | ICD-10-CM | POA: Diagnosis not present

## 2019-07-08 DIAGNOSIS — Z8673 Personal history of transient ischemic attack (TIA), and cerebral infarction without residual deficits: Secondary | ICD-10-CM | POA: Diagnosis not present

## 2019-07-08 DIAGNOSIS — L89610 Pressure ulcer of right heel, unstageable: Secondary | ICD-10-CM | POA: Diagnosis not present

## 2019-07-08 DIAGNOSIS — Z8616 Personal history of COVID-19: Secondary | ICD-10-CM | POA: Diagnosis not present

## 2019-07-08 DIAGNOSIS — S81011D Laceration without foreign body, right knee, subsequent encounter: Secondary | ICD-10-CM | POA: Diagnosis not present

## 2019-07-08 DIAGNOSIS — B9561 Methicillin susceptible Staphylococcus aureus infection as the cause of diseases classified elsewhere: Secondary | ICD-10-CM | POA: Diagnosis not present

## 2019-07-08 DIAGNOSIS — N3000 Acute cystitis without hematuria: Secondary | ICD-10-CM | POA: Diagnosis not present

## 2019-07-11 DIAGNOSIS — N183 Chronic kidney disease, stage 3 unspecified: Secondary | ICD-10-CM | POA: Diagnosis not present

## 2019-07-11 DIAGNOSIS — Z8616 Personal history of COVID-19: Secondary | ICD-10-CM | POA: Diagnosis not present

## 2019-07-11 DIAGNOSIS — I129 Hypertensive chronic kidney disease with stage 1 through stage 4 chronic kidney disease, or unspecified chronic kidney disease: Secondary | ICD-10-CM | POA: Diagnosis not present

## 2019-07-11 DIAGNOSIS — N3000 Acute cystitis without hematuria: Secondary | ICD-10-CM | POA: Diagnosis not present

## 2019-07-11 DIAGNOSIS — L89622 Pressure ulcer of left heel, stage 2: Secondary | ICD-10-CM | POA: Diagnosis not present

## 2019-07-11 DIAGNOSIS — L89312 Pressure ulcer of right buttock, stage 2: Secondary | ICD-10-CM | POA: Diagnosis not present

## 2019-07-11 DIAGNOSIS — L89610 Pressure ulcer of right heel, unstageable: Secondary | ICD-10-CM | POA: Diagnosis not present

## 2019-07-11 DIAGNOSIS — Z9181 History of falling: Secondary | ICD-10-CM | POA: Diagnosis not present

## 2019-07-11 DIAGNOSIS — I700 Atherosclerosis of aorta: Secondary | ICD-10-CM | POA: Diagnosis not present

## 2019-07-11 DIAGNOSIS — D631 Anemia in chronic kidney disease: Secondary | ICD-10-CM | POA: Diagnosis not present

## 2019-07-11 DIAGNOSIS — L89310 Pressure ulcer of right buttock, unstageable: Secondary | ICD-10-CM | POA: Diagnosis not present

## 2019-07-11 DIAGNOSIS — E785 Hyperlipidemia, unspecified: Secondary | ICD-10-CM | POA: Diagnosis not present

## 2019-07-11 DIAGNOSIS — M1990 Unspecified osteoarthritis, unspecified site: Secondary | ICD-10-CM | POA: Diagnosis not present

## 2019-07-11 DIAGNOSIS — S81011D Laceration without foreign body, right knee, subsequent encounter: Secondary | ICD-10-CM | POA: Diagnosis not present

## 2019-07-11 DIAGNOSIS — Z8673 Personal history of transient ischemic attack (TIA), and cerebral infarction without residual deficits: Secondary | ICD-10-CM | POA: Diagnosis not present

## 2019-07-11 DIAGNOSIS — S91312D Laceration without foreign body, left foot, subsequent encounter: Secondary | ICD-10-CM | POA: Diagnosis not present

## 2019-07-11 DIAGNOSIS — B9561 Methicillin susceptible Staphylococcus aureus infection as the cause of diseases classified elsewhere: Secondary | ICD-10-CM | POA: Diagnosis not present

## 2019-07-12 DIAGNOSIS — S91312D Laceration without foreign body, left foot, subsequent encounter: Secondary | ICD-10-CM | POA: Diagnosis not present

## 2019-07-12 DIAGNOSIS — N183 Chronic kidney disease, stage 3 unspecified: Secondary | ICD-10-CM | POA: Diagnosis not present

## 2019-07-12 DIAGNOSIS — I129 Hypertensive chronic kidney disease with stage 1 through stage 4 chronic kidney disease, or unspecified chronic kidney disease: Secondary | ICD-10-CM | POA: Diagnosis not present

## 2019-07-12 DIAGNOSIS — Z9181 History of falling: Secondary | ICD-10-CM | POA: Diagnosis not present

## 2019-07-12 DIAGNOSIS — B9561 Methicillin susceptible Staphylococcus aureus infection as the cause of diseases classified elsewhere: Secondary | ICD-10-CM | POA: Diagnosis not present

## 2019-07-12 DIAGNOSIS — E785 Hyperlipidemia, unspecified: Secondary | ICD-10-CM | POA: Diagnosis not present

## 2019-07-12 DIAGNOSIS — Z8616 Personal history of COVID-19: Secondary | ICD-10-CM | POA: Diagnosis not present

## 2019-07-12 DIAGNOSIS — N3000 Acute cystitis without hematuria: Secondary | ICD-10-CM | POA: Diagnosis not present

## 2019-07-12 DIAGNOSIS — L89312 Pressure ulcer of right buttock, stage 2: Secondary | ICD-10-CM | POA: Diagnosis not present

## 2019-07-12 DIAGNOSIS — D631 Anemia in chronic kidney disease: Secondary | ICD-10-CM | POA: Diagnosis not present

## 2019-07-12 DIAGNOSIS — L89622 Pressure ulcer of left heel, stage 2: Secondary | ICD-10-CM | POA: Diagnosis not present

## 2019-07-12 DIAGNOSIS — Z8673 Personal history of transient ischemic attack (TIA), and cerebral infarction without residual deficits: Secondary | ICD-10-CM | POA: Diagnosis not present

## 2019-07-12 DIAGNOSIS — L89310 Pressure ulcer of right buttock, unstageable: Secondary | ICD-10-CM | POA: Diagnosis not present

## 2019-07-12 DIAGNOSIS — L89610 Pressure ulcer of right heel, unstageable: Secondary | ICD-10-CM | POA: Diagnosis not present

## 2019-07-12 DIAGNOSIS — S81011D Laceration without foreign body, right knee, subsequent encounter: Secondary | ICD-10-CM | POA: Diagnosis not present

## 2019-07-12 DIAGNOSIS — I700 Atherosclerosis of aorta: Secondary | ICD-10-CM | POA: Diagnosis not present

## 2019-07-13 DIAGNOSIS — L89312 Pressure ulcer of right buttock, stage 2: Secondary | ICD-10-CM | POA: Diagnosis not present

## 2019-07-13 DIAGNOSIS — D631 Anemia in chronic kidney disease: Secondary | ICD-10-CM | POA: Diagnosis not present

## 2019-07-13 DIAGNOSIS — L89622 Pressure ulcer of left heel, stage 2: Secondary | ICD-10-CM | POA: Diagnosis not present

## 2019-07-13 DIAGNOSIS — Z8616 Personal history of COVID-19: Secondary | ICD-10-CM | POA: Diagnosis not present

## 2019-07-13 DIAGNOSIS — E785 Hyperlipidemia, unspecified: Secondary | ICD-10-CM | POA: Diagnosis not present

## 2019-07-13 DIAGNOSIS — B9561 Methicillin susceptible Staphylococcus aureus infection as the cause of diseases classified elsewhere: Secondary | ICD-10-CM | POA: Diagnosis not present

## 2019-07-13 DIAGNOSIS — L89610 Pressure ulcer of right heel, unstageable: Secondary | ICD-10-CM | POA: Diagnosis not present

## 2019-07-13 DIAGNOSIS — N183 Chronic kidney disease, stage 3 unspecified: Secondary | ICD-10-CM | POA: Diagnosis not present

## 2019-07-13 DIAGNOSIS — Z8673 Personal history of transient ischemic attack (TIA), and cerebral infarction without residual deficits: Secondary | ICD-10-CM | POA: Diagnosis not present

## 2019-07-13 DIAGNOSIS — Z9181 History of falling: Secondary | ICD-10-CM | POA: Diagnosis not present

## 2019-07-13 DIAGNOSIS — L89310 Pressure ulcer of right buttock, unstageable: Secondary | ICD-10-CM | POA: Diagnosis not present

## 2019-07-13 DIAGNOSIS — S81011D Laceration without foreign body, right knee, subsequent encounter: Secondary | ICD-10-CM | POA: Diagnosis not present

## 2019-07-13 DIAGNOSIS — I700 Atherosclerosis of aorta: Secondary | ICD-10-CM | POA: Diagnosis not present

## 2019-07-13 DIAGNOSIS — S91312D Laceration without foreign body, left foot, subsequent encounter: Secondary | ICD-10-CM | POA: Diagnosis not present

## 2019-07-13 DIAGNOSIS — I129 Hypertensive chronic kidney disease with stage 1 through stage 4 chronic kidney disease, or unspecified chronic kidney disease: Secondary | ICD-10-CM | POA: Diagnosis not present

## 2019-07-13 DIAGNOSIS — N3000 Acute cystitis without hematuria: Secondary | ICD-10-CM | POA: Diagnosis not present

## 2019-07-15 DIAGNOSIS — S91312D Laceration without foreign body, left foot, subsequent encounter: Secondary | ICD-10-CM | POA: Diagnosis not present

## 2019-07-15 DIAGNOSIS — B9561 Methicillin susceptible Staphylococcus aureus infection as the cause of diseases classified elsewhere: Secondary | ICD-10-CM | POA: Diagnosis not present

## 2019-07-15 DIAGNOSIS — L89310 Pressure ulcer of right buttock, unstageable: Secondary | ICD-10-CM | POA: Diagnosis not present

## 2019-07-15 DIAGNOSIS — D631 Anemia in chronic kidney disease: Secondary | ICD-10-CM | POA: Diagnosis not present

## 2019-07-15 DIAGNOSIS — I700 Atherosclerosis of aorta: Secondary | ICD-10-CM | POA: Diagnosis not present

## 2019-07-15 DIAGNOSIS — S81011D Laceration without foreign body, right knee, subsequent encounter: Secondary | ICD-10-CM | POA: Diagnosis not present

## 2019-07-15 DIAGNOSIS — I129 Hypertensive chronic kidney disease with stage 1 through stage 4 chronic kidney disease, or unspecified chronic kidney disease: Secondary | ICD-10-CM | POA: Diagnosis not present

## 2019-07-15 DIAGNOSIS — Z8673 Personal history of transient ischemic attack (TIA), and cerebral infarction without residual deficits: Secondary | ICD-10-CM | POA: Diagnosis not present

## 2019-07-15 DIAGNOSIS — N3000 Acute cystitis without hematuria: Secondary | ICD-10-CM | POA: Diagnosis not present

## 2019-07-15 DIAGNOSIS — N183 Chronic kidney disease, stage 3 unspecified: Secondary | ICD-10-CM | POA: Diagnosis not present

## 2019-07-15 DIAGNOSIS — Z8616 Personal history of COVID-19: Secondary | ICD-10-CM | POA: Diagnosis not present

## 2019-07-15 DIAGNOSIS — E785 Hyperlipidemia, unspecified: Secondary | ICD-10-CM | POA: Diagnosis not present

## 2019-07-15 DIAGNOSIS — L89622 Pressure ulcer of left heel, stage 2: Secondary | ICD-10-CM | POA: Diagnosis not present

## 2019-07-15 DIAGNOSIS — L89610 Pressure ulcer of right heel, unstageable: Secondary | ICD-10-CM | POA: Diagnosis not present

## 2019-07-15 DIAGNOSIS — Z9181 History of falling: Secondary | ICD-10-CM | POA: Diagnosis not present

## 2019-07-15 DIAGNOSIS — L89312 Pressure ulcer of right buttock, stage 2: Secondary | ICD-10-CM | POA: Diagnosis not present

## 2019-07-18 DIAGNOSIS — L89312 Pressure ulcer of right buttock, stage 2: Secondary | ICD-10-CM | POA: Diagnosis not present

## 2019-07-18 DIAGNOSIS — S81011D Laceration without foreign body, right knee, subsequent encounter: Secondary | ICD-10-CM | POA: Diagnosis not present

## 2019-07-18 DIAGNOSIS — N3000 Acute cystitis without hematuria: Secondary | ICD-10-CM | POA: Diagnosis not present

## 2019-07-18 DIAGNOSIS — B9561 Methicillin susceptible Staphylococcus aureus infection as the cause of diseases classified elsewhere: Secondary | ICD-10-CM | POA: Diagnosis not present

## 2019-07-18 DIAGNOSIS — D631 Anemia in chronic kidney disease: Secondary | ICD-10-CM | POA: Diagnosis not present

## 2019-07-18 DIAGNOSIS — I129 Hypertensive chronic kidney disease with stage 1 through stage 4 chronic kidney disease, or unspecified chronic kidney disease: Secondary | ICD-10-CM | POA: Diagnosis not present

## 2019-07-18 DIAGNOSIS — L89622 Pressure ulcer of left heel, stage 2: Secondary | ICD-10-CM | POA: Diagnosis not present

## 2019-07-18 DIAGNOSIS — Z8673 Personal history of transient ischemic attack (TIA), and cerebral infarction without residual deficits: Secondary | ICD-10-CM | POA: Diagnosis not present

## 2019-07-18 DIAGNOSIS — Z9181 History of falling: Secondary | ICD-10-CM | POA: Diagnosis not present

## 2019-07-18 DIAGNOSIS — I700 Atherosclerosis of aorta: Secondary | ICD-10-CM | POA: Diagnosis not present

## 2019-07-18 DIAGNOSIS — N183 Chronic kidney disease, stage 3 unspecified: Secondary | ICD-10-CM | POA: Diagnosis not present

## 2019-07-18 DIAGNOSIS — L89310 Pressure ulcer of right buttock, unstageable: Secondary | ICD-10-CM | POA: Diagnosis not present

## 2019-07-18 DIAGNOSIS — E785 Hyperlipidemia, unspecified: Secondary | ICD-10-CM | POA: Diagnosis not present

## 2019-07-18 DIAGNOSIS — L89610 Pressure ulcer of right heel, unstageable: Secondary | ICD-10-CM | POA: Diagnosis not present

## 2019-07-18 DIAGNOSIS — Z8616 Personal history of COVID-19: Secondary | ICD-10-CM | POA: Diagnosis not present

## 2019-07-18 DIAGNOSIS — S91312D Laceration without foreign body, left foot, subsequent encounter: Secondary | ICD-10-CM | POA: Diagnosis not present

## 2019-07-20 ENCOUNTER — Inpatient Hospital Stay (HOSPITAL_COMMUNITY)
Admission: EM | Admit: 2019-07-20 | Discharge: 2019-07-24 | DRG: 194 | Disposition: A | Discharge status: Home Health | Payer: Medicare Other | Source: Skilled Nursing Facility | Attending: Family Medicine | Admitting: Family Medicine

## 2019-07-20 ENCOUNTER — Emergency Department (HOSPITAL_COMMUNITY): Payer: Medicare Other

## 2019-07-20 ENCOUNTER — Other Ambulatory Visit: Payer: Self-pay

## 2019-07-20 ENCOUNTER — Encounter (HOSPITAL_COMMUNITY): Payer: Self-pay | Admitting: Emergency Medicine

## 2019-07-20 DIAGNOSIS — E785 Hyperlipidemia, unspecified: Secondary | ICD-10-CM | POA: Diagnosis not present

## 2019-07-20 DIAGNOSIS — N183 Chronic kidney disease, stage 3 unspecified: Secondary | ICD-10-CM | POA: Diagnosis not present

## 2019-07-20 DIAGNOSIS — R5381 Other malaise: Secondary | ICD-10-CM | POA: Diagnosis not present

## 2019-07-20 DIAGNOSIS — R509 Fever, unspecified: Secondary | ICD-10-CM

## 2019-07-20 DIAGNOSIS — R202 Paresthesia of skin: Secondary | ICD-10-CM | POA: Diagnosis not present

## 2019-07-20 DIAGNOSIS — S0990XA Unspecified injury of head, initial encounter: Secondary | ICD-10-CM | POA: Diagnosis not present

## 2019-07-20 DIAGNOSIS — L89519 Pressure ulcer of right ankle, unspecified stage: Secondary | ICD-10-CM | POA: Diagnosis not present

## 2019-07-20 DIAGNOSIS — I10 Essential (primary) hypertension: Secondary | ICD-10-CM | POA: Diagnosis not present

## 2019-07-20 DIAGNOSIS — L89312 Pressure ulcer of right buttock, stage 2: Secondary | ICD-10-CM | POA: Diagnosis not present

## 2019-07-20 DIAGNOSIS — D72829 Elevated white blood cell count, unspecified: Secondary | ICD-10-CM | POA: Diagnosis not present

## 2019-07-20 DIAGNOSIS — S91312D Laceration without foreign body, left foot, subsequent encounter: Secondary | ICD-10-CM | POA: Diagnosis not present

## 2019-07-20 DIAGNOSIS — Z87891 Personal history of nicotine dependence: Secondary | ICD-10-CM

## 2019-07-20 DIAGNOSIS — J189 Pneumonia, unspecified organism: Secondary | ICD-10-CM | POA: Diagnosis not present

## 2019-07-20 DIAGNOSIS — B964 Proteus (mirabilis) (morganii) as the cause of diseases classified elsewhere: Secondary | ICD-10-CM | POA: Diagnosis not present

## 2019-07-20 DIAGNOSIS — I700 Atherosclerosis of aorta: Secondary | ICD-10-CM | POA: Diagnosis not present

## 2019-07-20 DIAGNOSIS — N3000 Acute cystitis without hematuria: Secondary | ICD-10-CM | POA: Diagnosis not present

## 2019-07-20 DIAGNOSIS — W19XXXA Unspecified fall, initial encounter: Secondary | ICD-10-CM

## 2019-07-20 DIAGNOSIS — Z79899 Other long term (current) drug therapy: Secondary | ICD-10-CM | POA: Diagnosis not present

## 2019-07-20 DIAGNOSIS — Z87440 Personal history of urinary (tract) infections: Secondary | ICD-10-CM

## 2019-07-20 DIAGNOSIS — S81011D Laceration without foreign body, right knee, subsequent encounter: Secondary | ICD-10-CM | POA: Diagnosis not present

## 2019-07-20 DIAGNOSIS — L89890 Pressure ulcer of other site, unstageable: Secondary | ICD-10-CM

## 2019-07-20 DIAGNOSIS — D649 Anemia, unspecified: Secondary | ICD-10-CM | POA: Diagnosis not present

## 2019-07-20 DIAGNOSIS — B9561 Methicillin susceptible Staphylococcus aureus infection as the cause of diseases classified elsewhere: Secondary | ICD-10-CM | POA: Diagnosis not present

## 2019-07-20 DIAGNOSIS — S0511XA Contusion of eyeball and orbital tissues, right eye, initial encounter: Secondary | ICD-10-CM | POA: Diagnosis not present

## 2019-07-20 DIAGNOSIS — N390 Urinary tract infection, site not specified: Secondary | ICD-10-CM | POA: Diagnosis not present

## 2019-07-20 DIAGNOSIS — L89610 Pressure ulcer of right heel, unstageable: Secondary | ICD-10-CM | POA: Diagnosis not present

## 2019-07-20 DIAGNOSIS — D62 Acute posthemorrhagic anemia: Secondary | ICD-10-CM | POA: Diagnosis not present

## 2019-07-20 DIAGNOSIS — F0150 Vascular dementia without behavioral disturbance: Secondary | ICD-10-CM | POA: Diagnosis present

## 2019-07-20 DIAGNOSIS — S3993XA Unspecified injury of pelvis, initial encounter: Secondary | ICD-10-CM | POA: Diagnosis not present

## 2019-07-20 DIAGNOSIS — S0083XA Contusion of other part of head, initial encounter: Secondary | ICD-10-CM

## 2019-07-20 DIAGNOSIS — Z8673 Personal history of transient ischemic attack (TIA), and cerebral infarction without residual deficits: Secondary | ICD-10-CM | POA: Diagnosis not present

## 2019-07-20 DIAGNOSIS — Y92122 Bedroom in nursing home as the place of occurrence of the external cause: Secondary | ICD-10-CM

## 2019-07-20 DIAGNOSIS — I129 Hypertensive chronic kidney disease with stage 1 through stage 4 chronic kidney disease, or unspecified chronic kidney disease: Secondary | ICD-10-CM | POA: Diagnosis not present

## 2019-07-20 DIAGNOSIS — W06XXXA Fall from bed, initial encounter: Secondary | ICD-10-CM | POA: Diagnosis present

## 2019-07-20 DIAGNOSIS — Z8616 Personal history of COVID-19: Secondary | ICD-10-CM | POA: Diagnosis not present

## 2019-07-20 DIAGNOSIS — S199XXA Unspecified injury of neck, initial encounter: Secondary | ICD-10-CM | POA: Diagnosis not present

## 2019-07-20 DIAGNOSIS — Z743 Need for continuous supervision: Secondary | ICD-10-CM | POA: Diagnosis not present

## 2019-07-20 DIAGNOSIS — F0151 Vascular dementia with behavioral disturbance: Secondary | ICD-10-CM | POA: Diagnosis not present

## 2019-07-20 DIAGNOSIS — L89310 Pressure ulcer of right buttock, unstageable: Secondary | ICD-10-CM | POA: Diagnosis not present

## 2019-07-20 DIAGNOSIS — I4891 Unspecified atrial fibrillation: Secondary | ICD-10-CM | POA: Diagnosis not present

## 2019-07-20 DIAGNOSIS — Z03818 Encounter for observation for suspected exposure to other biological agents ruled out: Secondary | ICD-10-CM | POA: Diagnosis not present

## 2019-07-20 DIAGNOSIS — Z20822 Contact with and (suspected) exposure to covid-19: Secondary | ICD-10-CM | POA: Diagnosis present

## 2019-07-20 DIAGNOSIS — L8990 Pressure ulcer of unspecified site, unspecified stage: Secondary | ICD-10-CM | POA: Diagnosis present

## 2019-07-20 DIAGNOSIS — Z9181 History of falling: Secondary | ICD-10-CM | POA: Diagnosis not present

## 2019-07-20 DIAGNOSIS — L89622 Pressure ulcer of left heel, stage 2: Secondary | ICD-10-CM | POA: Diagnosis not present

## 2019-07-20 DIAGNOSIS — D631 Anemia in chronic kidney disease: Secondary | ICD-10-CM | POA: Diagnosis not present

## 2019-07-20 DIAGNOSIS — Z8546 Personal history of malignant neoplasm of prostate: Secondary | ICD-10-CM

## 2019-07-20 LAB — CBC WITH DIFFERENTIAL/PLATELET
Abs Immature Granulocytes: 0.13 10*3/uL — ABNORMAL HIGH (ref 0.00–0.07)
Basophils Absolute: 0.1 10*3/uL (ref 0.0–0.1)
Basophils Relative: 0 %
Eosinophils Absolute: 0 10*3/uL (ref 0.0–0.5)
Eosinophils Relative: 0 %
HCT: 21.6 % — ABNORMAL LOW (ref 39.0–52.0)
Hemoglobin: 6.6 g/dL — CL (ref 13.0–17.0)
Immature Granulocytes: 1 %
Lymphocytes Relative: 17 %
Lymphs Abs: 2.5 10*3/uL (ref 0.7–4.0)
MCH: 28 pg (ref 26.0–34.0)
MCHC: 30.6 g/dL (ref 30.0–36.0)
MCV: 91.5 fL (ref 80.0–100.0)
Monocytes Absolute: 1 10*3/uL (ref 0.1–1.0)
Monocytes Relative: 7 %
Neutro Abs: 10.7 10*3/uL — ABNORMAL HIGH (ref 1.7–7.7)
Neutrophils Relative %: 75 %
Platelets: 298 10*3/uL (ref 150–400)
RBC: 2.36 MIL/uL — ABNORMAL LOW (ref 4.22–5.81)
RDW: 18.8 % — ABNORMAL HIGH (ref 11.5–15.5)
WBC: 14.5 10*3/uL — ABNORMAL HIGH (ref 4.0–10.5)
nRBC: 0 % (ref 0.0–0.2)

## 2019-07-20 LAB — COMPREHENSIVE METABOLIC PANEL
ALT: 17 U/L (ref 0–44)
AST: 30 U/L (ref 15–41)
Albumin: 2.3 g/dL — ABNORMAL LOW (ref 3.5–5.0)
Alkaline Phosphatase: 97 U/L (ref 38–126)
Anion gap: 9 (ref 5–15)
BUN: 44 mg/dL — ABNORMAL HIGH (ref 8–23)
CO2: 15 mmol/L — ABNORMAL LOW (ref 22–32)
Calcium: 8.2 mg/dL — ABNORMAL LOW (ref 8.9–10.3)
Chloride: 112 mmol/L — ABNORMAL HIGH (ref 98–111)
Creatinine, Ser: 1.77 mg/dL — ABNORMAL HIGH (ref 0.61–1.24)
GFR calc Af Amer: 39 mL/min — ABNORMAL LOW (ref >60)
GFR calc non Af Amer: 34 mL/min — ABNORMAL LOW (ref >60)
Glucose, Bld: 131 mg/dL — ABNORMAL HIGH (ref 70–99)
Potassium: 4 mmol/L (ref 3.5–5.1)
Sodium: 136 mmol/L (ref 135–145)
Total Bilirubin: 0.5 mg/dL (ref 0.3–1.2)
Total Protein: 6.4 g/dL — ABNORMAL LOW (ref 6.5–8.1)

## 2019-07-20 LAB — LACTIC ACID, PLASMA: Lactic Acid, Venous: 1.3 mmol/L (ref 0.5–1.9)

## 2019-07-20 LAB — PROTIME-INR
INR: 1.5 — ABNORMAL HIGH (ref 0.8–1.2)
Prothrombin Time: 17.9 seconds — ABNORMAL HIGH (ref 11.4–15.2)

## 2019-07-20 LAB — APTT: aPTT: 46 seconds — ABNORMAL HIGH (ref 24–36)

## 2019-07-20 MED ORDER — SODIUM CHLORIDE 0.9 % IV SOLN
1.0000 g | Freq: Once | INTRAVENOUS | Status: AC
  Administered 2019-07-21: 1 g via INTRAVENOUS
  Filled 2019-07-20: qty 10

## 2019-07-20 MED ORDER — SODIUM CHLORIDE 0.9 % IV SOLN
500.0000 mg | Freq: Once | INTRAVENOUS | Status: AC
  Administered 2019-07-21: 500 mg via INTRAVENOUS
  Filled 2019-07-20: qty 500

## 2019-07-20 NOTE — Discharge Instructions (Addendum)
You have been diagnosed today with Fall, head injury, facial contusion.  At this time there does not appear to be the presence of an emergent medical condition, however there is always the potential for conditions to change. Please read and follow the below instructions.  Please return to the Emergency Department immediately for any new or worsening symptoms. Please be sure to follow up with your Primary Care Provider within one week regarding your visit today; please call their office to schedule an appointment even if you are feeling better for a follow-up visit. There were incidental findings on patient's imaging studies today including mild-moderate degenerative changes of both hips.  Stable atrophy and chronic small vessel ischemia of the brain along with old strokes.  Additionally there was postsurgical and degenerative changes of the cervical spine.  Emphysema was seen in the lungs in addition to a 6 mm right apical lung nodule.  Please discuss all the incidental findings with patient's primary care provider at follow-up visit.  You may view patient's imaging results in full on the MyChart account.  Get help right away if: You have: A very bad headache that is not helped by medicine. Trouble walking or weakness in your arms and legs. Clear or bloody fluid coming from your nose or ears. Changes in how you see (vision). Shaking movements that you cannot control. You lose your balance. You vomit. The black centers of your eyes (pupils) change in size. Your speech is slurred. Your dizziness gets worse. You pass out. You are sleepier than normal and have trouble staying awake. Your symptoms get worse. You have any new/concerning or worsening of symptoms  Please read the additional information packets attached to your discharge summary.  Do not take your medicine if  develop an itchy rash, swelling in your mouth or lips, or difficulty breathing; call 911 and seek immediate emergency  medical attention if this occurs.  Note: Portions of this text may have been transcribed using voice recognition software. Every effort was made to ensure accuracy; however, inadvertent computerized transcription errors may still be present.

## 2019-07-20 NOTE — ED Triage Notes (Signed)
Per EMS, patient from [facility], staff and patient reports fall out of bed. Hematoma to right head. Denies taking blood thinners. Denies LOC. Hx dementia.

## 2019-07-20 NOTE — ED Provider Notes (Signed)
[facility] DEPT Provider Note   CSN: [id number] Arrival date & time: 07/20/19  1734     History Chief Complaint  Patient presents with  . Fall    Manuel King is a 86 y.o. male history CVA, hypertension, dementia presents today after a fall.  Patient reports that he fell onto the right side of his face today, he cannot recall the circumstances surrounding his fall.  He reports that he is feeling well and has no pain or complaints at this time.  He is alert to self and place, not to time or event.  Level 5 caveat dementia  Triage note reports patient from [facility] after a fall out of bed hematoma of right head no blood thinner use or loss of consciousness.  HPI     Past Medical History:  Diagnosis Date  . Arthritis   . Cellulitis currently   right foot  . CVA (cerebral vascular accident) ([place])   . Dementia ([place])   . Hypertension   . Prostate cancer [facility])     Patient Active Problem List   Diagnosis Date Noted  . Hypokalemia 05/11/2019  . Nausea & vomiting 05/11/2019  . ARF (acute renal failure) ([place]) 05/11/2019  . Pressure injury of skin 05/11/2019  . Acute cystitis without hematuria   . Klebsiella pneumoniae infection   . Low vitamin B12 level 01/09/2019  . Acute lower UTI 01/09/2019  . HLD (hyperlipidemia) 08/18/2017  . Dementia ([place]) 08/18/2017  . Iron deficiency anemia 08/18/2017  . Acute renal failure superimposed on stage 3 chronic kidney disease ([place]) 08/18/2017  . Sepsis ([place]) 08/18/2017  . Urinary urgency 01/25/2015  . Ambulatory dysfunction 12/21/2014  . Generalized weakness 07/18/2014  . Fall 07/18/2014  . Malnutrition of moderate degree ([place]) 02/03/2014  . Bradycardia 02/01/2014  . Gout 09/16/2011  . Community acquired pneumonia 09/16/2011  . AKI (acute kidney injury) ([place]) 09/12/2011  . Falls 09/12/2011  . Acute parotitis 05/10/2011  . History of CVA (cerebrovascular accident) 05/10/2011  . Vascular  dementia ([place]) 05/10/2011  . SLEEP APNEA 11/16/2008  . BRADYCARDIA 09/29/2008  . FATIGUE, CHRONIC 09/29/2008  . Essential hypertension 09/28/2008  . PROSTATE CANCER, HX OF 09/28/2008    Past Surgical History:  Procedure Laterality Date  . ANTERIOR CERVICAL DECOMP/DISCECTOMY FUSION  05/29/1999   Anterior C3 and C4 diskectomy, decompression of the spinal cord,bone bank graft, Synthes plate and microscope/notes 09/17/2010  . CARPAL TUNNEL RELEASE Left 09/27/1999   Archie Endo 09/17/2010  . CARPAL TUNNEL RELEASE Right 08/12/2000   Archie Endo 09/17/2010  . FOOT SURGERY     Partial excision left medial cuneiform.; Partial excision left first metatarsal./notes 09/17/2010  . INSERTION PROSTATE RADIATION SEED  09/2005   Archie Endo 09/17/2010  . NECK SURGERY     "BOIL ON MY NECK"  . spider bite surgery     on left leg       Family History  Problem Relation Age of Onset  . Hypotension Mother   . Diabetes type II Mother   . CAD Mother   . Hypotension Father   . CAD Father   . CAD Sister     Social History   Tobacco Use  . Smoking status: Former Smoker    Years: 15.00  . Smokeless tobacco: Never Used  Substance Use Topics  . Alcohol use: No    Alcohol/week: 1.0 standard drinks    Types: 1 Glasses of wine per week    Comment: none in 2 years  or more  . Drug use: No    Home Medications Prior to Admission medications   Medication Sig Start Date End Date Taking? Authorizing Provider  acetaminophen (TYLENOL) 325 MG tablet Take 2 tablets (650 mg total) by mouth every 6 (six) hours as needed for mild pain (or Fever >/= 101). 01/11/19   Eugenie Filler, MD  acetaminophen (TYLENOL) 500 MG tablet Take 500 mg by mouth 2 (two) times daily.    [provider]  allopurinol (ZYLOPRIM) 100 MG tablet Take 100 mg by mouth daily.    [provider]  amLODipine (NORVASC) 5 MG tablet Take 5 mg by mouth daily.    [provider]  divalproex (DEPAKOTE) 250 MG DR tablet Take 250-500  mg by mouth See admin instructions. Take 250 mg  tablet in the am and 500 mg  tablets in the pm    [provider]  docusate sodium (COLACE) 100 MG capsule Take 100 mg by mouth daily.    [provider]  donepezil (ARICEPT) 10 MG tablet Take 10 mg by mouth at bedtime.    [provider]  ferrous sulfate 325 (65 FE) MG EC tablet Take 325 mg by mouth 2 (two) times daily.    [provider]  hydrALAZINE (APRESOLINE) 25 MG tablet Take 1 tablet (25 mg total) by mouth every 8 (eight) hours. 12/25/14   Theodis Blaze, MD  hydrochlorothiazide (HYDRODIURIL) 12.5 MG tablet Take 12.5 mg by mouth daily.    [provider]  irbesartan (AVAPRO) 150 MG tablet Take 150 mg by mouth daily.    [provider]  oxybutynin (DITROPAN) 5 MG tablet Take 5 mg by mouth 3 (three) times daily.    [provider]  PARoxetine (PAXIL) 20 MG tablet Take 20 mg by mouth daily.    [provider]  Polyethyl Glycol-Propyl Glycol (SYSTANE) 0.4-0.3 % SOLN Place 2 drops into both eyes 2 (two) times daily.     [provider]  pravastatin (PRAVACHOL) 40 MG tablet Take 40 mg by mouth at bedtime.     [provider]  traMADol (ULTRAM) 50 MG tablet Take 1 tablet (50 mg total) by mouth daily. 01/11/19   Eugenie Filler, MD    Allergies    Patient has no known allergies.  Review of Systems   Review of Systems  Unable to perform ROS: Dementia    Physical Exam Updated Vital Signs BP 111/71   Pulse (!) 110   Temp 99.7 F (37.6 C) (Oral)   Resp 18   SpO2 100%   Physical Exam Constitutional:      General: He is not in acute distress.    Appearance: Normal appearance. He is well-developed. He is not ill-appearing or diaphoretic.  HENT:     Head: Normocephalic. Contusion present. No raccoon eyes, Battle's sign or abrasion.     Jaw: There is normal jaw occlusion. No trismus.      Right Ear: External ear normal.     Left Ear: External ear  normal.     Nose: Nose normal.     Mouth/Throat:     Mouth: Mucous membranes are moist.     Pharynx: Oropharynx is clear.  Eyes:     General: Vision grossly intact. Gaze aligned appropriately.     Extraocular Movements: Extraocular movements intact.     Conjunctiva/sclera: Conjunctivae normal.     Pupils: Pupils are equal, round, and reactive to light.  Neck:  Trachea: Trachea and phonation normal. No tracheal deviation.  Cardiovascular:     Rate and Rhythm: Normal rate and regular rhythm.     Heart sounds: Normal heart sounds.  Pulmonary:     Effort: Pulmonary effort is normal. No respiratory distress.     Breath sounds: Normal air entry.  Chest:     Chest wall: No deformity, tenderness or crepitus.     Comments: No sign of injury to the chest Abdominal:     General: There is no distension.     Palpations: Abdomen is soft.     Tenderness: There is no abdominal tenderness. There is no guarding or rebound.     Comments: No sign of injury to the abdomen  Musculoskeletal:        General: Normal range of motion.     Cervical back: Normal range of motion and neck supple. No spinous process tenderness or muscular tenderness.     Comments: No midline C/T/L spinal tenderness to palpation, no paraspinal muscle tenderness, no deformity, crepitus, or step-off noted. No sign of injury to the neck or back. - Hips stable to compression bilaterally without pain.  Mild-moderate contractures of all 4 extremities, palpation and gentle range of motion of all major joints intact without crepitus/deformity or pain.  Skin:    General: Skin is warm and dry.  Neurological:     Mental Status: He is alert.     GCS: GCS eye subscore is 4. GCS verbal subscore is 5. GCS motor subscore is 6.     Comments: Speech is clear and goal oriented, follows commands Major Cranial nerves without deficit, no facial droop Sensation normal to light and sharp touch  Psychiatric:        Behavior: Behavior normal.      ED Results / Procedures / Treatments   Labs (all labs ordered are listed, but only abnormal results are displayed) Labs Reviewed - No data to display  EKG None  Radiology DG Pelvis 1-2 Views  Result Date: 07/20/2019 CLINICAL DATA:  Pain status post fall EXAM: PELVIS - 1-2 VIEW COMPARISON:  None. FINDINGS: There is no acute displaced fracture or dislocation. There are mild-to-moderate degenerative changes of both hips. There may be a soft tissue contusion overlying the proximal right femur. IMPRESSION: No acute osseous abnormality. Electronically Signed   By: Constance Holster M.D.   On: 07/20/2019 18:44   CT Head Wo Contrast  Result Date: 07/20/2019 CLINICAL DATA:  Dementia patient post fall out of bed. Hematoma to right side of head. EXAM: CT HEAD WITHOUT CONTRAST TECHNIQUE: Contiguous axial images were obtained from the base of the skull through the vertex without intravenous contrast. COMPARISON:  Head CT 08/19/2017 FINDINGS: Brain: No intracranial hemorrhage, mass effect, or midline shift. Stable degree of generalized atrophy. No hydrocephalus. The basilar cisterns are patent. Remote lacunar infarct in the right thalamus and basal ganglia. Moderate chronic small vessel ischemia peers similar. No evidence of territorial infarct or acute ischemia. No extra-axial or intracranial fluid collection. Vascular: Atherosclerosis of skullbase vasculature without hyperdense vessel or abnormal calcification. Skull: No fracture or focal lesion. Sinuses/Orbits: Assessed on concurrent face CT, reported separately. Other: Right supraorbital soft tissue hematoma. IMPRESSION: 1. Right supraorbital soft tissue hematoma. No fracture or acute intracranial abnormality. 2. Stable atrophy and chronic small vessel ischemia. Remote lacunar infarcts in the right thalamus and basal ganglia. Electronically Signed   By: Keith Rake M.D.   On: 07/20/2019 19:12   CT Cervical Spine Wo Contrast  Result Date:  07/20/2019 CLINICAL DATA:  Dementia patient post fall out of bed. Hematoma to right side of head. EXAM: CT MAXILLOFACIAL WITHOUT CONTRAST CT CERVICAL SPINE WITHOUT CONTRAST TECHNIQUE: Multidetector CT imaging of the maxillofacial structures was performed. Multiplanar CT image reconstructions were also generated. A small metallic BB was placed on the right temple in order to reliably differentiate right from left. Multidetector CT imaging of the cervical spine was performed without intravenous contrast. Multiplanar CT image reconstructions were also generated. COMPARISON:  Cervical spine CT 08/19/2017 FINDINGS: CT MAXILLOFACIAL FINDINGS Osseous: Second medic arches, nasal bone, and mandibles are intact. Nasal septum is midline. Temporomandibular joints are congruent. Patient appears edentulous with a possible single central upper incisor. Orbits: Both orbits and globes are intact. No orbital fracture. Right cataract resection. Sinuses: No sinus fracture or fluid level. Mastoid air cells are clear. No mucosal thickening. Soft tissues: Right supraorbital soft tissue hematoma. Small left parotid nodules are likely intraparotid lymph nodes. Limited intracranial: Assessed on concurrent head CT, reported separately. CT CERVICAL FINDINGS Mild motion artifact limitations. Alignment: Reversal of normal cervical lordosis. Mild broad-based dextroscoliotic curvature of the spine. No jumped or perched facets. No traumatic subluxation. Skull base and vertebrae: Anterior C3-C4 fusion. Hardware is grossly intact, motion artifact through this region. No evidence of acute fracture. The dens and skull base are intact. Degenerative change at C1-C2 with bony fragmentation. Soft tissues and spinal canal: No prevertebral fluid or swelling. No visible canal hematoma. Disc levels: Anterior C3-C4 fusion. Diffuse disc space narrowing and endplate spurring. Multilevel facet hypertrophy. Upper chest: Biapical pleuroparenchymal scarring.  Emphysema. 6 mm right apical pulmonary nodule, series 3, image 102 only partially included in field of view. This appears present on 05/10/2011 neck CT and is considered benign. Other: Carotid and subclavian atherosclerosis. IMPRESSION: CT face: Right supraorbital soft tissue hematoma. No orbital or facial bone fracture. CT cervical spine: Postsurgical and degenerative change in the cervical spine without acute fracture or subluxation. Electronically Signed   By: Keith Rake M.D.   On: 07/20/2019 19:22   CT Maxillofacial Wo Contrast  Result Date: 07/20/2019 CLINICAL DATA:  Dementia patient post fall out of bed. Hematoma to right side of head. EXAM: CT MAXILLOFACIAL WITHOUT CONTRAST CT CERVICAL SPINE WITHOUT CONTRAST TECHNIQUE: Multidetector CT imaging of the maxillofacial structures was performed. Multiplanar CT image reconstructions were also generated. A small metallic BB was placed on the right temple in order to reliably differentiate right from left. Multidetector CT imaging of the cervical spine was performed without intravenous contrast. Multiplanar CT image reconstructions were also generated. COMPARISON:  Cervical spine CT 08/19/2017 FINDINGS: CT MAXILLOFACIAL FINDINGS Osseous: Second medic arches, nasal bone, and mandibles are intact. Nasal septum is midline. Temporomandibular joints are congruent. Patient appears edentulous with a possible single central upper incisor. Orbits: Both orbits and globes are intact. No orbital fracture. Right cataract resection. Sinuses: No sinus fracture or fluid level. Mastoid air cells are clear. No mucosal thickening. Soft tissues: Right supraorbital soft tissue hematoma. Small left parotid nodules are likely intraparotid lymph nodes. Limited intracranial: Assessed on concurrent head CT, reported separately. CT CERVICAL FINDINGS Mild motion artifact limitations. Alignment: Reversal of normal cervical lordosis. Mild broad-based dextroscoliotic curvature of the  spine. No jumped or perched facets. No traumatic subluxation. Skull base and vertebrae: Anterior C3-C4 fusion. Hardware is grossly intact, motion artifact through this region. No evidence of acute fracture. The dens and skull base are intact. Degenerative change at C1-C2 with bony fragmentation. Soft tissues and spinal  canal: No prevertebral fluid or swelling. No visible canal hematoma. Disc levels: Anterior C3-C4 fusion. Diffuse disc space narrowing and endplate spurring. Multilevel facet hypertrophy. Upper chest: Biapical pleuroparenchymal scarring. Emphysema. 6 mm right apical pulmonary nodule, series 3, image 102 only partially included in field of view. This appears present on 05/10/2011 neck CT and is considered benign. Other: Carotid and subclavian atherosclerosis. IMPRESSION: CT face: Right supraorbital soft tissue hematoma. No orbital or facial bone fracture. CT cervical spine: Postsurgical and degenerative change in the cervical spine without acute fracture or subluxation. Electronically Signed   By: Keith Rake M.D.   On: 07/20/2019 19:22    Procedures Procedures (including critical care time)  Medications Ordered in ED Medications - No data to display  ED Course  I have reviewed the triage vital signs and the nursing notes.  Pertinent labs & imaging results that were available during my care of the patient were reviewed by me and considered in my medical decision making (see chart for details).  Clinical Course as of Jul 20 2007  Wed Jul 20, 2019  1837 Delia Chimes;   [BM]    Clinical Course User Index [BM] Gari Crown   MDM Rules/Calculators/A&P                     On initial evaluation 86 year old male well-appearing no acute distress small hematoma of the right lateral eyebrow following a fall today out of bed, no blood thinner use.  He denies any pain reports that he is feeling well and has no complaints.  No other injuries aside from the small hematoma seen on  exam, no pain of the neck, back, chest, abdomen or pelvis.  Patient with contractures of all 4 extremities however no deformities or pain of any major joints.  Will obtain CT head/max face/cervical spine and x-ray of the pelvis at this time.  No indication for imaging of the chest or abdomen or extremities. - I spoke with Delia Chimes from patient's facility at [facility], and she reports that patient had a witnessed fall out of bed today by another resident, RN was immediately at bedside no loss of consciousness.  She confirms that patient is now on a blood thinner and also that there are no other injuries seen.  She reports that patient does have contractures at baseline and is full care at their facility.  CT Head:  IMPRESSION:  1. Right supraorbital soft tissue hematoma. No fracture or acute  intracranial abnormality.  2. Stable atrophy and chronic small vessel ischemia. Remote lacunar  infarcts in the right thalamus and basal ganglia.   CT Max Face/Cspine: IMPRESSION:  CT face:    Right supraorbital soft tissue hematoma. No orbital or facial bone  fracture.    CT cervical spine:    Postsurgical and degenerative change in the cervical spine without  acute fracture or subluxation.    DG Pelvis:    IMPRESSION:  No acute osseous abnormality.  I have personally reviewed patient's imaging and agree with radiologist interpretation and then, no obvious intracranial hemorrhage or cervical spine fracture, additionally no obvious fracture of the pelvis.  No evidence of contusion of the soft tissues overlying the right femur on exam. - Patient reassessed sleeping comfortably no acute distress.  Vital signs stable on monitor, he reports that he is feeling well and would like something to drink and eat.  Incidental findings noted on patient's discharge paperwork and sent to facility.  At  this time there does not appear to be any evidence of an acute emergency medical condition and the  patient appears stable for discharge with appropriate outpatient follow up. Diagnosis was discussed with patient who verbalizes understanding of care plan and is agreeable to discharge. I have discussed return precautions with patient who verbalizes understanding of return precautions. Patient encouraged to follow-up with their PCP. All questions answered.  Patient seen and evaluated by Dr. Ralene Bathe during this visit who agrees with discharge back to facility at this time.  Note: Portions of this report may have been transcribed using voice recognition software. Every effort was made to ensure accuracy; however, inadvertent computerized transcription errors may still be present. Final Clinical Impression(s) / ED Diagnoses Final diagnoses:  Fall, initial encounter  Contusion of face, initial encounter    Rx / DC Orders ED Discharge Orders    None       Gari Crown 07/20/19 2012    Quintella Reichert, MD 07/23/19 1037

## 2019-07-21 ENCOUNTER — Encounter (HOSPITAL_COMMUNITY): Payer: Self-pay | Admitting: Family Medicine

## 2019-07-21 ENCOUNTER — Observation Stay (HOSPITAL_COMMUNITY): Payer: Medicare Other

## 2019-07-21 DIAGNOSIS — R410 Disorientation, unspecified: Secondary | ICD-10-CM | POA: Diagnosis not present

## 2019-07-21 DIAGNOSIS — R509 Fever, unspecified: Secondary | ICD-10-CM | POA: Diagnosis not present

## 2019-07-21 DIAGNOSIS — N183 Chronic kidney disease, stage 3 unspecified: Secondary | ICD-10-CM | POA: Diagnosis present

## 2019-07-21 DIAGNOSIS — I10 Essential (primary) hypertension: Secondary | ICD-10-CM | POA: Diagnosis not present

## 2019-07-21 DIAGNOSIS — L89610 Pressure ulcer of right heel, unstageable: Secondary | ICD-10-CM | POA: Diagnosis present

## 2019-07-21 DIAGNOSIS — Z8673 Personal history of transient ischemic attack (TIA), and cerebral infarction without residual deficits: Secondary | ICD-10-CM | POA: Diagnosis not present

## 2019-07-21 DIAGNOSIS — J189 Pneumonia, unspecified organism: Secondary | ICD-10-CM | POA: Diagnosis present

## 2019-07-21 DIAGNOSIS — D649 Anemia, unspecified: Secondary | ICD-10-CM

## 2019-07-21 DIAGNOSIS — Z87440 Personal history of urinary (tract) infections: Secondary | ICD-10-CM | POA: Diagnosis not present

## 2019-07-21 DIAGNOSIS — F0150 Vascular dementia without behavioral disturbance: Secondary | ICD-10-CM | POA: Diagnosis present

## 2019-07-21 DIAGNOSIS — I129 Hypertensive chronic kidney disease with stage 1 through stage 4 chronic kidney disease, or unspecified chronic kidney disease: Secondary | ICD-10-CM | POA: Diagnosis present

## 2019-07-21 DIAGNOSIS — Z7401 Bed confinement status: Secondary | ICD-10-CM | POA: Diagnosis not present

## 2019-07-21 DIAGNOSIS — Y92122 Bedroom in nursing home as the place of occurrence of the external cause: Secondary | ICD-10-CM | POA: Diagnosis not present

## 2019-07-21 DIAGNOSIS — Z20822 Contact with and (suspected) exposure to covid-19: Secondary | ICD-10-CM | POA: Diagnosis present

## 2019-07-21 DIAGNOSIS — W06XXXA Fall from bed, initial encounter: Secondary | ICD-10-CM | POA: Diagnosis present

## 2019-07-21 DIAGNOSIS — Z79899 Other long term (current) drug therapy: Secondary | ICD-10-CM | POA: Diagnosis not present

## 2019-07-21 DIAGNOSIS — R202 Paresthesia of skin: Secondary | ICD-10-CM | POA: Diagnosis present

## 2019-07-21 DIAGNOSIS — N390 Urinary tract infection, site not specified: Secondary | ICD-10-CM | POA: Diagnosis present

## 2019-07-21 DIAGNOSIS — I959 Hypotension, unspecified: Secondary | ICD-10-CM | POA: Diagnosis not present

## 2019-07-21 DIAGNOSIS — D72829 Elevated white blood cell count, unspecified: Secondary | ICD-10-CM | POA: Diagnosis present

## 2019-07-21 DIAGNOSIS — F0151 Vascular dementia with behavioral disturbance: Secondary | ICD-10-CM

## 2019-07-21 DIAGNOSIS — Z8546 Personal history of malignant neoplasm of prostate: Secondary | ICD-10-CM | POA: Diagnosis not present

## 2019-07-21 DIAGNOSIS — Z743 Need for continuous supervision: Secondary | ICD-10-CM | POA: Diagnosis not present

## 2019-07-21 DIAGNOSIS — L89519 Pressure ulcer of right ankle, unspecified stage: Secondary | ICD-10-CM

## 2019-07-21 DIAGNOSIS — S99921A Unspecified injury of right foot, initial encounter: Secondary | ICD-10-CM | POA: Diagnosis not present

## 2019-07-21 DIAGNOSIS — B964 Proteus (mirabilis) (morganii) as the cause of diseases classified elsewhere: Secondary | ICD-10-CM | POA: Diagnosis present

## 2019-07-21 DIAGNOSIS — M2550 Pain in unspecified joint: Secondary | ICD-10-CM | POA: Diagnosis not present

## 2019-07-21 DIAGNOSIS — Z87891 Personal history of nicotine dependence: Secondary | ICD-10-CM | POA: Diagnosis not present

## 2019-07-21 DIAGNOSIS — D62 Acute posthemorrhagic anemia: Secondary | ICD-10-CM | POA: Diagnosis present

## 2019-07-21 DIAGNOSIS — S0511XA Contusion of eyeball and orbital tissues, right eye, initial encounter: Secondary | ICD-10-CM | POA: Diagnosis present

## 2019-07-21 LAB — URINALYSIS, ROUTINE W REFLEX MICROSCOPIC
Bilirubin Urine: NEGATIVE
Glucose, UA: NEGATIVE mg/dL
Ketones, ur: NEGATIVE mg/dL
Nitrite: NEGATIVE
Protein, ur: 100 mg/dL — AB
Specific Gravity, Urine: 1.01 (ref 1.005–1.030)
pH: 8 (ref 5.0–8.0)

## 2019-07-21 LAB — BASIC METABOLIC PANEL
Anion gap: 10 (ref 5–15)
BUN: 39 mg/dL — ABNORMAL HIGH (ref 8–23)
CO2: 14 mmol/L — ABNORMAL LOW (ref 22–32)
Calcium: 8.2 mg/dL — ABNORMAL LOW (ref 8.9–10.3)
Chloride: 110 mmol/L (ref 98–111)
Creatinine, Ser: 1.59 mg/dL — ABNORMAL HIGH (ref 0.61–1.24)
GFR calc Af Amer: 45 mL/min — ABNORMAL LOW (ref >60)
GFR calc non Af Amer: 38 mL/min — ABNORMAL LOW (ref >60)
Glucose, Bld: 99 mg/dL (ref 70–99)
Potassium: 4.2 mmol/L (ref 3.5–5.1)
Sodium: 134 mmol/L — ABNORMAL LOW (ref 135–145)

## 2019-07-21 LAB — CBC WITH DIFFERENTIAL/PLATELET
Abs Immature Granulocytes: 0.15 10*3/uL — ABNORMAL HIGH (ref 0.00–0.07)
Basophils Absolute: 0.1 10*3/uL (ref 0.0–0.1)
Basophils Relative: 0 %
Eosinophils Absolute: 0.1 10*3/uL (ref 0.0–0.5)
Eosinophils Relative: 1 %
HCT: 26.5 % — ABNORMAL LOW (ref 39.0–52.0)
Hemoglobin: 8.6 g/dL — ABNORMAL LOW (ref 13.0–17.0)
Immature Granulocytes: 1 %
Lymphocytes Relative: 14 %
Lymphs Abs: 2.1 10*3/uL (ref 0.7–4.0)
MCH: 28.3 pg (ref 26.0–34.0)
MCHC: 32.5 g/dL (ref 30.0–36.0)
MCV: 87.2 fL (ref 80.0–100.0)
Monocytes Absolute: 1.2 10*3/uL — ABNORMAL HIGH (ref 0.1–1.0)
Monocytes Relative: 8 %
Neutro Abs: 11 10*3/uL — ABNORMAL HIGH (ref 1.7–7.7)
Neutrophils Relative %: 76 %
Platelets: 282 10*3/uL (ref 150–400)
RBC: 3.04 MIL/uL — ABNORMAL LOW (ref 4.22–5.81)
RDW: 17.3 % — ABNORMAL HIGH (ref 11.5–15.5)
WBC: 14.7 10*3/uL — ABNORMAL HIGH (ref 4.0–10.5)
nRBC: 0 % (ref 0.0–0.2)

## 2019-07-21 LAB — FOLATE: Folate: 51.2 ng/mL (ref >5.9)

## 2019-07-21 LAB — COMPREHENSIVE METABOLIC PANEL
ALT: 18 U/L (ref 0–44)
AST: 35 U/L (ref 15–41)
Albumin: 2.2 g/dL — ABNORMAL LOW (ref 3.5–5.0)
Alkaline Phosphatase: 101 U/L (ref 38–126)
Anion gap: 11 (ref 5–15)
BUN: 39 mg/dL — ABNORMAL HIGH (ref 8–23)
CO2: 13 mmol/L — ABNORMAL LOW (ref 22–32)
Calcium: 8.3 mg/dL — ABNORMAL LOW (ref 8.9–10.3)
Chloride: 111 mmol/L (ref 98–111)
Creatinine, Ser: 1.57 mg/dL — ABNORMAL HIGH (ref 0.61–1.24)
GFR calc Af Amer: 45 mL/min — ABNORMAL LOW (ref >60)
GFR calc non Af Amer: 39 mL/min — ABNORMAL LOW (ref >60)
Glucose, Bld: 95 mg/dL (ref 70–99)
Potassium: 4.3 mmol/L (ref 3.5–5.1)
Sodium: 135 mmol/L (ref 135–145)
Total Bilirubin: 0.7 mg/dL (ref 0.3–1.2)
Total Protein: 6.3 g/dL — ABNORMAL LOW (ref 6.5–8.1)

## 2019-07-21 LAB — PREPARE RBC (CROSSMATCH)

## 2019-07-21 LAB — VITAMIN B12: Vitamin B-12: 461 pg/mL (ref 180–914)

## 2019-07-21 LAB — POC SARS CORONAVIRUS 2 AG -  ED: SARS Coronavirus 2 Ag: NEGATIVE

## 2019-07-21 LAB — IRON AND TIBC
Iron: 18 ug/dL — ABNORMAL LOW (ref 45–182)
Saturation Ratios: 12 % — ABNORMAL LOW (ref 17.9–39.5)
TIBC: 147 ug/dL — ABNORMAL LOW (ref 250–450)
UIBC: 129 ug/dL

## 2019-07-21 LAB — LACTIC ACID, PLASMA: Lactic Acid, Venous: 1 mmol/L (ref 0.5–1.9)

## 2019-07-21 LAB — SARS CORONAVIRUS 2 (TAT 6-24 HRS): SARS Coronavirus 2: NEGATIVE

## 2019-07-21 LAB — FERRITIN: Ferritin: 884 ng/mL — ABNORMAL HIGH (ref 24–336)

## 2019-07-21 LAB — POC OCCULT BLOOD, ED: Fecal Occult Bld: NEGATIVE

## 2019-07-21 LAB — HEMOGLOBIN AND HEMATOCRIT, BLOOD
HCT: 21.2 % — ABNORMAL LOW (ref 39.0–52.0)
Hemoglobin: 6.8 g/dL — CL (ref 13.0–17.0)

## 2019-07-21 LAB — MRSA PCR SCREENING: MRSA by PCR: NEGATIVE

## 2019-07-21 MED ORDER — AMLODIPINE BESYLATE 5 MG PO TABS
5.0000 mg | ORAL_TABLET | Freq: Every day | ORAL | Status: DC
  Administered 2019-07-22 – 2019-07-24 (×3): 5 mg via ORAL
  Filled 2019-07-21 (×4): qty 1

## 2019-07-21 MED ORDER — DONEPEZIL HCL 10 MG PO TABS
10.0000 mg | ORAL_TABLET | Freq: Every day | ORAL | Status: DC
  Administered 2019-07-21 – 2019-07-23 (×3): 10 mg via ORAL
  Filled 2019-07-21 (×3): qty 1

## 2019-07-21 MED ORDER — ORAL CARE MOUTH RINSE
15.0000 mL | Freq: Two times a day (BID) | OROMUCOSAL | Status: DC
  Administered 2019-07-22 – 2019-07-23 (×4): 15 mL via OROMUCOSAL

## 2019-07-21 MED ORDER — ACETAMINOPHEN 325 MG PO TABS: 650.0000 mg | ORAL_TABLET | Freq: Four times a day (QID) | ORAL | Status: DC | PRN

## 2019-07-21 MED ORDER — SODIUM CHLORIDE 0.9 % IV SOLN
500.0000 mg | INTRAVENOUS | Status: DC
  Administered 2019-07-21 – 2019-07-22 (×2): 500 mg via INTRAVENOUS
  Filled 2019-07-21 (×3): qty 500

## 2019-07-21 MED ORDER — SODIUM CHLORIDE 0.9% IV SOLUTION: Freq: Once | INTRAVENOUS | Status: DC

## 2019-07-21 MED ORDER — DIVALPROEX SODIUM 250 MG PO DR TAB
500.0000 mg | DELAYED_RELEASE_TABLET | Freq: Every evening | ORAL | Status: DC
  Administered 2019-07-22 – 2019-07-23 (×2): 500 mg via ORAL
  Filled 2019-07-21 (×2): qty 2

## 2019-07-21 MED ORDER — SENNOSIDES-DOCUSATE SODIUM 8.6-50 MG PO TABS: 1.0000 | ORAL_TABLET | Freq: Every evening | ORAL | Status: DC | PRN

## 2019-07-21 MED ORDER — ONDANSETRON HCL 4 MG PO TABS: 4.0000 mg | ORAL_TABLET | Freq: Four times a day (QID) | ORAL | Status: DC | PRN

## 2019-07-21 MED ORDER — DIVALPROEX SODIUM 250 MG PO DR TAB
250.0000 mg | DELAYED_RELEASE_TABLET | Freq: Every day | ORAL | Status: DC
  Administered 2019-07-22 – 2019-07-24 (×3): 250 mg via ORAL
  Filled 2019-07-21 (×4): qty 1

## 2019-07-21 MED ORDER — PAROXETINE HCL 20 MG PO TABS
20.0000 mg | ORAL_TABLET | Freq: Every day | ORAL | Status: DC
  Administered 2019-07-22 – 2019-07-24 (×3): 20 mg via ORAL
  Filled 2019-07-21 (×4): qty 1

## 2019-07-21 MED ORDER — SODIUM CHLORIDE 0.9 % IV SOLN
1.0000 g | INTRAVENOUS | Status: DC
  Administered 2019-07-21 – 2019-07-22 (×2): 1 g via INTRAVENOUS
  Filled 2019-07-21: qty 10
  Filled 2019-07-21 (×2): qty 1

## 2019-07-21 MED ORDER — ONDANSETRON HCL 4 MG/2ML IJ SOLN: 4.0000 mg | Freq: Four times a day (QID) | INTRAMUSCULAR | Status: DC | PRN

## 2019-07-21 MED ORDER — HYDRALAZINE HCL 20 MG/ML IJ SOLN: 10.0000 mg | Freq: Four times a day (QID) | INTRAMUSCULAR | Status: DC | PRN

## 2019-07-21 MED ORDER — DONEPEZIL HCL 5 MG PO TABS: 10.0000 mg | ORAL_TABLET | Freq: Every day | ORAL | Status: DC

## 2019-07-21 MED ORDER — HYDRALAZINE HCL 25 MG PO TABS
25.0000 mg | ORAL_TABLET | Freq: Three times a day (TID) | ORAL | Status: DC
  Administered 2019-07-21 – 2019-07-24 (×6): 25 mg via ORAL
  Filled 2019-07-21 (×3): qty 3
  Filled 2019-07-21: qty 1
  Filled 2019-07-21 (×3): qty 3
  Filled 2019-07-21: qty 1
  Filled 2019-07-21: qty 3

## 2019-07-21 MED ORDER — ACETAMINOPHEN 650 MG RE SUPP: 650.0000 mg | Freq: Four times a day (QID) | RECTAL | Status: DC | PRN

## 2019-07-21 MED ORDER — COLLAGENASE 250 UNIT/GM EX OINT
TOPICAL_OINTMENT | Freq: Every day | CUTANEOUS | Status: DC
  Filled 2019-07-21: qty 30

## 2019-07-21 NOTE — Assessment & Plan Note (Signed)
Chronic. Baseline Scr 1.7-2.0

## 2019-07-21 NOTE — H&P (Addendum)
History and Physical    Manuel King [id number] DOB: 09-05-1929 DOA: 07/20/2019  PCP: Merrilee Seashore, MD   Patient coming from: SNF  I have personally briefly reviewed patient's old medical records in [place]  CC: fall HPI: 86 year old male with a history of vascular dementia, hypertension, chronic anemia presents from the nursing home due to a fall.  While the patient was in the ER, he spiked a temperature.  Patient recently had Covid 19 (moderna) vaccination on July 14, 2019.  Patient is demented and cannot give any history review of systems.  In ER, the patient was noted to have a low hemoglobin of 6.8.  Patient has known anemia but is usually not this low.  Repeat hemoglobin has not yet been performed.  Awaiting COVID-19 testing.  Patient will be observed in the hospital due to fever.  And to decide if the patient requires blood transfusion.    ED Course: CT head showed right supra orbital hematoma. No acute fractures.   Review of Systems:  Review of Systems  Unable to perform ROS: Dementia    Past Medical History:  Diagnosis Date  . Arthritis   . Cellulitis currently   right foot  . CVA (cerebral vascular accident) ([place])   . Dementia ([place])   . Hypertension   . Prostate cancer [facility])     Past Surgical History:  Procedure Laterality Date  . ANTERIOR CERVICAL DECOMP/DISCECTOMY FUSION  05/29/1999   Anterior C3 and C4 diskectomy, decompression of the spinal cord,bone bank graft, Synthes plate and microscope/notes 09/17/2010  . CARPAL TUNNEL RELEASE Left 09/27/1999   Archie Endo 09/17/2010  . CARPAL TUNNEL RELEASE Right 08/12/2000   Archie Endo 09/17/2010  . FOOT SURGERY     Partial excision left medial cuneiform.; Partial excision left first metatarsal./notes 09/17/2010  . INSERTION PROSTATE RADIATION SEED  09/2005   Archie Endo 09/17/2010  . NECK SURGERY     "BOIL ON MY NECK"  . spider bite surgery     on left leg     reports that he has quit smoking. He quit  after 15.00 years of use. He has never used smokeless tobacco. He reports that he does not drink alcohol or use drugs.  No Known Allergies  Family History  Problem Relation Age of Onset  . Hypotension Mother   . Diabetes type II Mother   . CAD Mother   . Hypotension Father   . CAD Father   . CAD Sister     Prior to Admission medications   Medication Sig Start Date End Date Taking? Authorizing Provider  acetaminophen (TYLENOL) 325 MG tablet Take 2 tablets (650 mg total) by mouth every 6 (six) hours as needed for mild pain (or Fever >/= 101). 01/11/19   Eugenie Filler, MD  acetaminophen (TYLENOL) 500 MG tablet Take 500 mg by mouth 2 (two) times daily.    [provider]  allopurinol (ZYLOPRIM) 100 MG tablet Take 100 mg by mouth daily.    [provider]  amLODipine (NORVASC) 5 MG tablet Take 5 mg by mouth daily.    [provider]  divalproex (DEPAKOTE) 250 MG DR tablet Take 250-500 mg by mouth See admin instructions. Take 250 mg  tablet in the am and 500 mg  tablets in the pm    [provider]  docusate sodium (COLACE) 100 MG capsule Take 100 mg by mouth daily.    [provider]  donepezil (ARICEPT) 10 MG tablet Take 10 mg by  mouth at bedtime.    [provider]  ferrous sulfate 325 (65 FE) MG EC tablet Take 325 mg by mouth 2 (two) times daily.    [provider]  hydrALAZINE (APRESOLINE) 25 MG tablet Take 1 tablet (25 mg total) by mouth every 8 (eight) hours. 12/25/14   Theodis Blaze, MD  hydrochlorothiazide (HYDRODIURIL) 12.5 MG tablet Take 12.5 mg by mouth daily.    [provider]  irbesartan (AVAPRO) 150 MG tablet Take 150 mg by mouth daily.    [provider]  oxybutynin (DITROPAN) 5 MG tablet Take 5 mg by mouth 3 (three) times daily.    [provider]  PARoxetine (PAXIL) 20 MG tablet Take 20 mg by mouth daily.    [provider]  Polyethyl Glycol-Propyl Glycol (SYSTANE) 0.4-0.3 %  SOLN Place 2 drops into both eyes 2 (two) times daily.     [provider]  pravastatin (PRAVACHOL) 40 MG tablet Take 40 mg by mouth at bedtime.     [provider]  traMADol (ULTRAM) 50 MG tablet Take 1 tablet (50 mg total) by mouth daily. 01/11/19   Eugenie Filler, MD    Physical Exam: Vitals:   07/21/19 0000 07/21/19 0030 07/21/19 0100 07/21/19 0130  BP: 138/70 137/64 (!) 166/93 (!) 157/102  Pulse: 83 71 87 (!) 104  Resp: (!) 21 (!) 8 16 (!) 24  Temp: 99.1 F (37.3 C)     TempSrc: Oral     SpO2: 100% 100% 100% 100%  Weight:      Height:        Physical Exam  Nursing note and vitals reviewed. Constitutional: No distress.  Chronically ill appearing male. No distress  HENT:  Head: Normocephalic.  Mild bruising over right eye  Eyes: Right eye exhibits no discharge. Left eye exhibits no discharge.  Neck: No JVD present.  Cardiovascular: Normal rate.  Respiratory: Effort normal. No respiratory distress. He has no wheezes.  GI: Soft. He exhibits no distension. There is no abdominal tenderness.  Musculoskeletal:        General: No edema.  Neurological:  Awake. Not oriented to person, place or time.  Skin: Skin is warm. He is not diaphoretic.  Multiple skin wounds. See pictures.           Labs on Admission: I have personally reviewed following labs and imaging studies  CBC: Recent Labs  Lab 07/20/19 2310  WBC 14.5*  NEUTROABS 10.7*  HGB 6.6*  HCT 21.6*  MCV 91.5  PLT [id number]   Basic Metabolic Panel: Recent Labs  Lab 07/20/19 2310  NA 136  K 4.0  CL 112*  CO2 15*  GLUCOSE 131*  BUN 44*  CREATININE 1.77*  CALCIUM 8.2*   GFR: Estimated Creatinine Clearance: 32.3 mL/min (A) (by C-G formula based on SCr of 1.77 mg/dL (H)). Liver Function Tests: Recent Labs  Lab 07/20/19 2310  AST 30  ALT 17  ALKPHOS 97  BILITOT 0.5  PROT 6.4*  ALBUMIN 2.3*   No results for input(s): LIPASE, AMYLASE in the last 168 hours. No results for  input(s): AMMONIA in the last 168 hours. Coagulation Profile: Recent Labs  Lab 07/20/19 2310  INR 1.5*   Cardiac Enzymes: No results for input(s): CKTOTAL, CKMB, CKMBINDEX, TROPONINI in the last 168 hours. BNP (last 3 results) No results for input(s): PROBNP in the last 8760 hours. HbA1C: No results for input(s): HGBA1C in the last 72 hours. CBG: No results for input(s): GLUCAP  in the last 168 hours. Lipid Profile: No results for input(s): CHOL, HDL, LDLCALC, TRIG, CHOLHDL, LDLDIRECT in the last 72 hours. Thyroid Function Tests: No results for input(s): TSH, T4TOTAL, FREET4, T3FREE, THYROIDAB in the last 72 hours. Anemia Panel: No results for input(s): VITAMINB12, FOLATE, FERRITIN, TIBC, IRON, RETICCTPCT in the last 72 hours. Urine analysis:    Component Value Date/Time   COLORURINE YELLOW 05/11/2019 0237   APPEARANCEUR HAZY (A) 05/11/2019 0237   LABSPEC 1.013 05/11/2019 0237   PHURINE 5.0 05/11/2019 0237   GLUCOSEU NEGATIVE 05/11/2019 0237   HGBUR MODERATE (A) 05/11/2019 0237   BILIRUBINUR NEGATIVE 05/11/2019 0237   KETONESUR NEGATIVE 05/11/2019 0237   PROTEINUR 100 (A) 05/11/2019 0237   UROBILINOGEN 0.2 12/21/2014 1409   NITRITE NEGATIVE 05/11/2019 0237   LEUKOCYTESUR LARGE (A) 05/11/2019 0237    Radiological Exams on Admission: I have personally reviewed images DG Pelvis 1-2 Views  Result Date: 07/20/2019 CLINICAL DATA:  Pain status post fall EXAM: PELVIS - 1-2 VIEW COMPARISON:  None. FINDINGS: There is no acute displaced fracture or dislocation. There are mild-to-moderate degenerative changes of both hips. There may be a soft tissue contusion overlying the proximal right femur. IMPRESSION: No acute osseous abnormality. Electronically Signed   By: Constance Holster M.D.   On: 07/20/2019 18:44   CT Head Wo Contrast  Result Date: 07/20/2019 CLINICAL DATA:  Dementia patient post fall out of bed. Hematoma to right side of head. EXAM: CT HEAD WITHOUT CONTRAST TECHNIQUE:  Contiguous axial images were obtained from the base of the skull through the vertex without intravenous contrast. COMPARISON:  Head CT 08/19/2017 FINDINGS: Brain: No intracranial hemorrhage, mass effect, or midline shift. Stable degree of generalized atrophy. No hydrocephalus. The basilar cisterns are patent. Remote lacunar infarct in the right thalamus and basal ganglia. Moderate chronic small vessel ischemia peers similar. No evidence of territorial infarct or acute ischemia. No extra-axial or intracranial fluid collection. Vascular: Atherosclerosis of skullbase vasculature without hyperdense vessel or abnormal calcification. Skull: No fracture or focal lesion. Sinuses/Orbits: Assessed on concurrent face CT, reported separately. Other: Right supraorbital soft tissue hematoma. IMPRESSION: 1. Right supraorbital soft tissue hematoma. No fracture or acute intracranial abnormality. 2. Stable atrophy and chronic small vessel ischemia. Remote lacunar infarcts in the right thalamus and basal ganglia. Electronically Signed   By: Keith Rake M.D.   On: 07/20/2019 19:12   CT Cervical Spine Wo Contrast  Result Date: 07/20/2019 CLINICAL DATA:  Dementia patient post fall out of bed. Hematoma to right side of head. EXAM: CT MAXILLOFACIAL WITHOUT CONTRAST CT CERVICAL SPINE WITHOUT CONTRAST TECHNIQUE: Multidetector CT imaging of the maxillofacial structures was performed. Multiplanar CT image reconstructions were also generated. A small metallic BB was placed on the right temple in order to reliably differentiate right from left. Multidetector CT imaging of the cervical spine was performed without intravenous contrast. Multiplanar CT image reconstructions were also generated. COMPARISON:  Cervical spine CT 08/19/2017 FINDINGS: CT MAXILLOFACIAL FINDINGS Osseous: Second medic arches, nasal bone, and mandibles are intact. Nasal septum is midline. Temporomandibular joints are congruent. Patient appears edentulous with a  possible single central upper incisor. Orbits: Both orbits and globes are intact. No orbital fracture. Right cataract resection. Sinuses: No sinus fracture or fluid level. Mastoid air cells are clear. No mucosal thickening. Soft tissues: Right supraorbital soft tissue hematoma. Small left parotid nodules are likely intraparotid lymph nodes. Limited intracranial: Assessed on concurrent head CT, reported separately. CT CERVICAL FINDINGS Mild motion artifact limitations. Alignment: Reversal of  normal cervical lordosis. Mild broad-based dextroscoliotic curvature of the spine. No jumped or perched facets. No traumatic subluxation. Skull base and vertebrae: Anterior C3-C4 fusion. Hardware is grossly intact, motion artifact through this region. No evidence of acute fracture. The dens and skull base are intact. Degenerative change at C1-C2 with bony fragmentation. Soft tissues and spinal canal: No prevertebral fluid or swelling. No visible canal hematoma. Disc levels: Anterior C3-C4 fusion. Diffuse disc space narrowing and endplate spurring. Multilevel facet hypertrophy. Upper chest: Biapical pleuroparenchymal scarring. Emphysema. 6 mm right apical pulmonary nodule, series 3, image 102 only partially included in field of view. This appears present on 05/10/2011 neck CT and is considered benign. Other: Carotid and subclavian atherosclerosis. IMPRESSION: CT face: Right supraorbital soft tissue hematoma. No orbital or facial bone fracture. CT cervical spine: Postsurgical and degenerative change in the cervical spine without acute fracture or subluxation. Electronically Signed   By: Keith Rake M.D.   On: 07/20/2019 19:22   DG Chest Port 1 View  Result Date: 07/20/2019 CLINICAL DATA:  Fever EXAM: PORTABLE CHEST 1 VIEW COMPARISON:  Radiograph 05/15/2019 FINDINGS: Lung volumes are slightly diminished with streaky basilar atelectatic changes. Question some more focal opacity in the left infrahilar lung. The aorta is  calcified. The remaining cardiomediastinal contours are unremarkable. Stable vascular calcium in the left axilla. No acute osseous or soft tissue abnormality. Degenerative changes are present in the imaged spine and shoulders. Surgical clip projects over the right upper quadrant. IMPRESSION: Slightly diminished lung volumes with streaky basilar atelectatic changes. Question more focal opacity in the left infrahilar lung which could reflect further atelectatic change or early consolidation in the setting of fever. Electronically Signed   By: Lovena Le M.D.   On: 07/20/2019 23:24   CT Maxillofacial Wo Contrast  Result Date: 07/20/2019 CLINICAL DATA:  Dementia patient post fall out of bed. Hematoma to right side of head. EXAM: CT MAXILLOFACIAL WITHOUT CONTRAST CT CERVICAL SPINE WITHOUT CONTRAST TECHNIQUE: Multidetector CT imaging of the maxillofacial structures was performed. Multiplanar CT image reconstructions were also generated. A small metallic BB was placed on the right temple in order to reliably differentiate right from left. Multidetector CT imaging of the cervical spine was performed without intravenous contrast. Multiplanar CT image reconstructions were also generated. COMPARISON:  Cervical spine CT 08/19/2017 FINDINGS: CT MAXILLOFACIAL FINDINGS Osseous: Second medic arches, nasal bone, and mandibles are intact. Nasal septum is midline. Temporomandibular joints are congruent. Patient appears edentulous with a possible single central upper incisor. Orbits: Both orbits and globes are intact. No orbital fracture. Right cataract resection. Sinuses: No sinus fracture or fluid level. Mastoid air cells are clear. No mucosal thickening. Soft tissues: Right supraorbital soft tissue hematoma. Small left parotid nodules are likely intraparotid lymph nodes. Limited intracranial: Assessed on concurrent head CT, reported separately. CT CERVICAL FINDINGS Mild motion artifact limitations. Alignment: Reversal of  normal cervical lordosis. Mild broad-based dextroscoliotic curvature of the spine. No jumped or perched facets. No traumatic subluxation. Skull base and vertebrae: Anterior C3-C4 fusion. Hardware is grossly intact, motion artifact through this region. No evidence of acute fracture. The dens and skull base are intact. Degenerative change at C1-C2 with bony fragmentation. Soft tissues and spinal canal: No prevertebral fluid or swelling. No visible canal hematoma. Disc levels: Anterior C3-C4 fusion. Diffuse disc space narrowing and endplate spurring. Multilevel facet hypertrophy. Upper chest: Biapical pleuroparenchymal scarring. Emphysema. 6 mm right apical pulmonary nodule, series 3, image 102 only partially included in field of view. This appears present  on 05/10/2011 neck CT and is considered benign. Other: Carotid and subclavian atherosclerosis. IMPRESSION: CT face: Right supraorbital soft tissue hematoma. No orbital or facial bone fracture. CT cervical spine: Postsurgical and degenerative change in the cervical spine without acute fracture or subluxation. Electronically Signed   By: Keith Rake M.D.   On: 07/20/2019 19:22    EKG: I have personally reviewed EKG: NSR   Assessment/Plan Principal Problem:   Fever in adult Active Problems:   Anemia   Essential hypertension   Vascular dementia (HCC)   Pressure injury of skin   History of CVA (cerebrovascular accident)   CKD (chronic kidney disease) stage 3, GFR 30-59 ml/min    Fever in adult Observation status. Still awaiting UA. Will have RN straight cath him. ER provider start empiric Rocephin/zithromax. Pt is on RA. Not convinced pt has pna. Hold on further abx.  Anemia Repeat HgB. Pt has type and screen. ER provider verbally stated that pt was hemoccult negative. Pt has known hx of anemia but it is usually this low.  Vascular dementia ([place]) Chronic. On depakote, aricept. paxil.  Essential hypertension Stable. On multiple  meds.  Pressure injury of skin Pt has recently changed dressings to multiple skin wounds. I did not remove dressings. Will have wound care evaluate his skin wounds.         History of CVA (cerebrovascular accident) Chronic.  CKD (chronic kidney disease) stage 3, GFR 30-59 ml/min Chronic. Baseline Scr 1.7-2.0   DVT prophylaxis: SCDs Code Status: Full Code Family Communication: no family bedside  Disposition Plan: DC back to SNF when anemia workup completed and fever has resolved.  Consults called: none  Admission status: Observation, Med-Surg   Kristopher Oppenheim, DO [facility] 07/21/2019, 2:06 AM

## 2019-07-21 NOTE — Subjective & Objective (Signed)
CC: fall HPI: 86 year old male with a history of vascular dementia, hypertension, chronic anemia presents from the nursing home due to a fall.  While the patient was in the ER, he spiked a temperature.  Patient recently had Covid 19 (moderna) vaccination on July 14, 2019.  Patient is demented and cannot give any history review of systems.  In ER, the patient was noted to have a low hemoglobin of 6.8.  Patient has known anemia but is usually not this low.  Repeat hemoglobin has not yet been performed.  Awaiting COVID-19 testing.  Patient will be observed in the hospital due to fever.  And to decide if the patient requires blood transfusion.

## 2019-07-21 NOTE — Consult Note (Signed)
[facility] Nurse Consult Note: Reason for Consult: Consult requested for bilat legs/feet Wound type: Right knee with partial thickness abrasion; 1.5X1.5X.1cm, pink and dry Left outer ankle with full thickness wound; 1.2X1.2X.2cm, yellow and moist, mod amt tan drainage, no odor Left great toe with dry callous; 1X.5cm, no odor or drainage Right heel with unstageable pressure injury; 2X2cm, 100% yellow slough, mod amt tan drainage, some strong odor Pressure Injury POA: Yes Dressing procedure/placement/frequency: Topical treatment orders provided for bedside nurses to perform daily as follows to assist with removal of nonviable skin to right heel and left outer ankle: Apply Santyl to left outer ankle and right heel wounds Q day, then cover with moist gauze and foam dressing.  (Change foam dressing Q 3 days or PRN soiling.) Float heel to reduce pressure.  Foam dressing to protect and promote healing to right knee and left great toe. Please re-consult if further assistance is needed.  Thank-you,  Julien Girt MSN, [place], [place], [place], [place]

## 2019-07-21 NOTE — Assessment & Plan Note (Signed)
Observation status. Still awaiting UA. Will have RN straight cath him. ER provider start empiric Rocephin/zithromax. Pt is on RA. Not convinced pt has pna. Hold on further abx.

## 2019-07-21 NOTE — Assessment & Plan Note (Signed)
Stable. On multiple meds.

## 2019-07-21 NOTE — Assessment & Plan Note (Addendum)
Pt has recently changed dressings to multiple skin wounds. I did not remove dressings. Will have wound care evaluate his skin wounds.

## 2019-07-21 NOTE — Plan of Care (Signed)
  Problem: Clinical Measurements: Goal: Respiratory complications will improve Outcome: Progressing Goal: Cardiovascular complication will be avoided Outcome: Progressing   Problem: Pain Managment: Goal: General experience of comfort will improve Outcome: Progressing   Problem: Education: Goal: Knowledge of General Education information will improve Description: Including pain rating scale, medication(s)/side effects and non-pharmacologic comfort measures Outcome: Not Progressing   Problem: Health Behavior/Discharge Planning: Goal: Ability to manage health-related needs will improve Outcome: Not Progressing   Problem: Clinical Measurements: Goal: Ability to maintain clinical measurements within normal limits will improve Outcome: Not Progressing Goal: Will remain free from infection Outcome: Not Progressing Goal: Diagnostic test results will improve Outcome: Not Progressing   Problem: Activity: Goal: Risk for activity intolerance will decrease Outcome: Not Progressing   Problem: Nutrition: Goal: Adequate nutrition will be maintained Outcome: Not Progressing   Problem: Coping: Goal: Level of anxiety will decrease Outcome: Not Progressing   Problem: Elimination: Goal: Will not experience complications related to bowel motility Outcome: Not Progressing Goal: Will not experience complications related to urinary retention Outcome: Not Progressing   Problem: Safety: Goal: Ability to remain free from injury will improve Outcome: Not Progressing   Problem: Skin Integrity: Goal: Risk for impaired skin integrity will decrease Outcome: Not Progressing

## 2019-07-21 NOTE — Assessment & Plan Note (Signed)
Chronic. 

## 2019-07-21 NOTE — Assessment & Plan Note (Signed)
Chronic. On depakote, aricept. paxil.

## 2019-07-21 NOTE — Assessment & Plan Note (Addendum)
Repeat HgB. Pt has type and screen. ER provider verbally stated that pt was hemoccult negative. Pt has known hx of anemia but it is usually this low.

## 2019-07-21 NOTE — Progress Notes (Signed)
PROGRESS NOTE    Manuel King  [id number] DOB: February 29, 1930 DOA: 07/20/2019 PCP: Merrilee Seashore, MD   Brief Narrative: Manuel King is a 86 y.o. male with a history of vascular dementia, hypertension, chronic anemia. He presented from his ALF secondary to a fall. CT imaging was consistent with a right supraorbital soft tissue hematoma. He was also noted to have a fever of unknown etiology. He was started empirically on ceftriaxone and azithromycin for possible pneumonia. Urine and blood cultures obtained and are pending.   Assessment & Plan:   Principal Problem:   Fever in adult Active Problems:   Essential hypertension   History of CVA (cerebrovascular accident)   Vascular dementia ([place])   Pressure injury of skin   Anemia   CKD (chronic kidney disease) stage 3, GFR 30-59 ml/min   Fever   Fever Unknown etiology. Possibly related to a developing pneumonia. Patient has had UTIs in the past in the setting of prostate cancer hx. He also has multiple pressure injuries, most notably a right heel unstageable ulcer. Empirically started on Ceftriaxone and Azithromycin -Continue Ceftriaxone and Azithromycin -Follow blood and urine cultures -Foot x-ray  Lung infiltrate Possible pneumonia. Patient does not appear to have symptoms consistent with pneumonia but is on a dysphagia diet presumably secondary to aspiration risk. -SLP evaluation  Acute blood loss anemia Chronic anemia Acute blood loss secondary to fall and resultant right supraorbital soft tissue hematoma. Patient received 1 unit of PRBC. Hemoglobin from 6.6 to 8.6 today. -CBC in AM  Essential hypertension Slightly uncontrolled. -Continue amlodipine and hydralazine -Hydralazine 10 mg IV prn  Vascular dementia Possible this could be contributing to patient's lack of intake recently. -Continue Aricept, Paxil, Depakote  Pressure injury Right heel. POA. Wound care consulted   DVT prophylaxis: SCDs Code  Status:   Code Status: Full Code Family Communication: Daughter on telephone (7 minutes) Disposition Plan: Discharge back to ALF pending workup for fever vs transition to oral antibiotics   Consultants:   None  Procedures:   None  Antimicrobials:  Ceftriaxone  Azithromycin    Subjective: No concerns per patient.  Objective: Vitals:   07/21/19 0800 07/21/19 0855 07/21/19 1200 07/21/19 1334  BP: (!) 176/91 (!) 179/86 (!) 146/85 (!) 157/89  Pulse: 98 90 92 87  Resp: 18 18  16   Temp:  98.2 F (36.8 C) 100 F (37.8 C) (!) 100.7 F (38.2 C)  TempSrc:  Oral Oral Oral  SpO2: 99% 100% 99% 100%  Weight:  74.1 kg    Height:        Intake/Output Summary (Last 24 hours) at 07/21/2019 1536 Last data filed at 07/21/2019 1339 Gross per 24 hour  Intake 2252.5 ml  Output 1100 ml  Net 1152.5 ml   Filed Weights   07/20/19 2313 07/21/19 0855  Weight: 92 kg 74.1 kg    Examination:  General exam: Appears calm and comfortable Respiratory system: Clear to auscultation. Respiratory effort normal. Cardiovascular system: S1 & S2 heard, RRR. No murmurs, rubs, gallops or clicks. Gastrointestinal system: Abdomen is nondistended, soft and nontender. No organomegaly or masses felt. Normal bowel sounds heard. Central nervous system: Alert and oriented to self only. Extremities: No edema. No calf tenderness Skin: No cyanosis. Psychiatry: Judgement and insight appear impaired.    Data Reviewed: I have personally reviewed following labs and imaging studies  CBC: Recent Labs  Lab 07/20/19 2310 07/21/19 0319 07/21/19 0946  WBC 14.5*  --  14.7*  NEUTROABS 10.7*  --  11.0*  HGB 6.6* 6.8* 8.6*  HCT 21.6* 21.2* 26.5*  MCV 91.5  --  87.2  PLT 298  --  [id number]   Basic Metabolic Panel: Recent Labs  Lab 07/20/19 2310 07/21/19 0914 07/21/19 0946  NA 136 134* 135  K 4.0 4.2 4.3  CL 112* 110 111  CO2 15* 14* 13*  GLUCOSE 131* 99 95  BUN 44* 39* 39*  CREATININE 1.77* 1.59* 1.57*    CALCIUM 8.2* 8.2* 8.3*   GFR: Estimated Creatinine Clearance: 34.7 mL/min (A) (by C-G formula based on SCr of 1.57 mg/dL (H)). Liver Function Tests: Recent Labs  Lab 07/20/19 2310 07/21/19 0946  AST 30 35  ALT 17 18  ALKPHOS 97 101  BILITOT 0.5 0.7  PROT 6.4* 6.3*  ALBUMIN 2.3* 2.2*   No results for input(s): LIPASE, AMYLASE in the last 168 hours. No results for input(s): AMMONIA in the last 168 hours. Coagulation Profile: Recent Labs  Lab 07/20/19 2310  INR 1.5*   Cardiac Enzymes: No results for input(s): CKTOTAL, CKMB, CKMBINDEX, TROPONINI in the last 168 hours. BNP (last 3 results) No results for input(s): PROBNP in the last 8760 hours. HbA1C: No results for input(s): HGBA1C in the last 72 hours. CBG: No results for input(s): GLUCAP in the last 168 hours. Lipid Profile: No results for input(s): CHOL, HDL, LDLCALC, TRIG, CHOLHDL, LDLDIRECT in the last 72 hours. Thyroid Function Tests: No results for input(s): TSH, T4TOTAL, FREET4, T3FREE, THYROIDAB in the last 72 hours. Anemia Panel: Recent Labs    07/21/19 0945  VITAMINB12 461  FOLATE 51.2  FERRITIN 884*  TIBC 147*  IRON 18*   Sepsis Labs: Recent Labs  Lab 07/20/19 2310 07/21/19 0059  LATICACIDVEN 1.3 1.0    Recent Results (from the past 240 hour(s))  SARS CORONAVIRUS 2 (TAT 6-24 HRS) Nasopharyngeal Nasopharyngeal Swab     Status: None   Collection Time: 07/21/19 12:27 AM   Specimen: Nasopharyngeal Swab  Result Value Ref Range Status   SARS Coronavirus 2 NEGATIVE NEGATIVE Final    Comment: (NOTE) SARS-CoV-2 target nucleic acids are NOT DETECTED. The SARS-CoV-2 RNA is generally detectable in upper and lower respiratory specimens during the acute phase of infection. Negative results do not preclude SARS-CoV-2 infection, do not rule out co-infections with other pathogens, and should not be used as the sole basis for treatment or other patient management decisions. Negative results must be combined  with clinical observations, patient history, and epidemiological information. The expected result is Negative. Fact Sheet for Patients: SugarRoll.be Fact Sheet for Healthcare Providers: https://www.woods-mathews.com/ This test is not yet approved or cleared by the [place] FDA and  has been authorized for detection and/or diagnosis of SARS-CoV-2 by FDA under an Emergency Use Authorization (EUA). This EUA will remain  in effect (meaning this test can be used) for the duration of the COVID-19 declaration under Section 56 4(b)(1) of the Act, 21 U.S.C. section 360bbb-3(b)(1), unless the authorization is terminated or revoked sooner. Performed at [facility], [facility] [address], [place],  [address]          Radiology Studies: DG Pelvis 1-2 Views  Result Date: 07/20/2019 CLINICAL DATA:  Pain status post fall EXAM: PELVIS - 1-2 VIEW COMPARISON:  None. FINDINGS: There is no acute displaced fracture or dislocation. There are mild-to-moderate degenerative changes of both hips. There may be a soft tissue contusion overlying the proximal right femur. IMPRESSION: No acute osseous abnormality. Electronically Signed   By: Constance Holster  M.D.   On: 07/20/2019 18:44   CT Head Wo Contrast  Result Date: 07/20/2019 CLINICAL DATA:  Dementia patient post fall out of bed. Hematoma to right side of head. EXAM: CT HEAD WITHOUT CONTRAST TECHNIQUE: Contiguous axial images were obtained from the base of the skull through the vertex without intravenous contrast. COMPARISON:  Head CT 08/19/2017 FINDINGS: Brain: No intracranial hemorrhage, mass effect, or midline shift. Stable degree of generalized atrophy. No hydrocephalus. The basilar cisterns are patent. Remote lacunar infarct in the right thalamus and basal ganglia. Moderate chronic small vessel ischemia peers similar. No evidence of territorial infarct or acute ischemia. No extra-axial or intracranial  fluid collection. Vascular: Atherosclerosis of skullbase vasculature without hyperdense vessel or abnormal calcification. Skull: No fracture or focal lesion. Sinuses/Orbits: Assessed on concurrent face CT, reported separately. Other: Right supraorbital soft tissue hematoma. IMPRESSION: 1. Right supraorbital soft tissue hematoma. No fracture or acute intracranial abnormality. 2. Stable atrophy and chronic small vessel ischemia. Remote lacunar infarcts in the right thalamus and basal ganglia. Electronically Signed   By: Keith Rake M.D.   On: 07/20/2019 19:12   CT Cervical Spine Wo Contrast  Result Date: 07/20/2019 CLINICAL DATA:  Dementia patient post fall out of bed. Hematoma to right side of head. EXAM: CT MAXILLOFACIAL WITHOUT CONTRAST CT CERVICAL SPINE WITHOUT CONTRAST TECHNIQUE: Multidetector CT imaging of the maxillofacial structures was performed. Multiplanar CT image reconstructions were also generated. A small metallic BB was placed on the right temple in order to reliably differentiate right from left. Multidetector CT imaging of the cervical spine was performed without intravenous contrast. Multiplanar CT image reconstructions were also generated. COMPARISON:  Cervical spine CT 08/19/2017 FINDINGS: CT MAXILLOFACIAL FINDINGS Osseous: Second medic arches, nasal bone, and mandibles are intact. Nasal septum is midline. Temporomandibular joints are congruent. Patient appears edentulous with a possible single central upper incisor. Orbits: Both orbits and globes are intact. No orbital fracture. Right cataract resection. Sinuses: No sinus fracture or fluid level. Mastoid air cells are clear. No mucosal thickening. Soft tissues: Right supraorbital soft tissue hematoma. Small left parotid nodules are likely intraparotid lymph nodes. Limited intracranial: Assessed on concurrent head CT, reported separately. CT CERVICAL FINDINGS Mild motion artifact limitations. Alignment: Reversal of normal cervical  lordosis. Mild broad-based dextroscoliotic curvature of the spine. No jumped or perched facets. No traumatic subluxation. Skull base and vertebrae: Anterior C3-C4 fusion. Hardware is grossly intact, motion artifact through this region. No evidence of acute fracture. The dens and skull base are intact. Degenerative change at C1-C2 with bony fragmentation. Soft tissues and spinal canal: No prevertebral fluid or swelling. No visible canal hematoma. Disc levels: Anterior C3-C4 fusion. Diffuse disc space narrowing and endplate spurring. Multilevel facet hypertrophy. Upper chest: Biapical pleuroparenchymal scarring. Emphysema. 6 mm right apical pulmonary nodule, series 3, image 102 only partially included in field of view. This appears present on 05/10/2011 neck CT and is considered benign. Other: Carotid and subclavian atherosclerosis. IMPRESSION: CT face: Right supraorbital soft tissue hematoma. No orbital or facial bone fracture. CT cervical spine: Postsurgical and degenerative change in the cervical spine without acute fracture or subluxation. Electronically Signed   By: Keith Rake M.D.   On: 07/20/2019 19:22   DG Chest Port 1 View  Result Date: 07/20/2019 CLINICAL DATA:  Fever EXAM: PORTABLE CHEST 1 VIEW COMPARISON:  Radiograph 05/15/2019 FINDINGS: Lung volumes are slightly diminished with streaky basilar atelectatic changes. Question some more focal opacity in the left infrahilar lung. The aorta is calcified. The remaining cardiomediastinal  contours are unremarkable. Stable vascular calcium in the left axilla. No acute osseous or soft tissue abnormality. Degenerative changes are present in the imaged spine and shoulders. Surgical clip projects over the right upper quadrant. IMPRESSION: Slightly diminished lung volumes with streaky basilar atelectatic changes. Question more focal opacity in the left infrahilar lung which could reflect further atelectatic change or early consolidation in the setting of  fever. Electronically Signed   By: Lovena Le M.D.   On: 07/20/2019 23:24   CT Maxillofacial Wo Contrast  Result Date: 07/20/2019 CLINICAL DATA:  Dementia patient post fall out of bed. Hematoma to right side of head. EXAM: CT MAXILLOFACIAL WITHOUT CONTRAST CT CERVICAL SPINE WITHOUT CONTRAST TECHNIQUE: Multidetector CT imaging of the maxillofacial structures was performed. Multiplanar CT image reconstructions were also generated. A small metallic BB was placed on the right temple in order to reliably differentiate right from left. Multidetector CT imaging of the cervical spine was performed without intravenous contrast. Multiplanar CT image reconstructions were also generated. COMPARISON:  Cervical spine CT 08/19/2017 FINDINGS: CT MAXILLOFACIAL FINDINGS Osseous: Second medic arches, nasal bone, and mandibles are intact. Nasal septum is midline. Temporomandibular joints are congruent. Patient appears edentulous with a possible single central upper incisor. Orbits: Both orbits and globes are intact. No orbital fracture. Right cataract resection. Sinuses: No sinus fracture or fluid level. Mastoid air cells are clear. No mucosal thickening. Soft tissues: Right supraorbital soft tissue hematoma. Small left parotid nodules are likely intraparotid lymph nodes. Limited intracranial: Assessed on concurrent head CT, reported separately. CT CERVICAL FINDINGS Mild motion artifact limitations. Alignment: Reversal of normal cervical lordosis. Mild broad-based dextroscoliotic curvature of the spine. No jumped or perched facets. No traumatic subluxation. Skull base and vertebrae: Anterior C3-C4 fusion. Hardware is grossly intact, motion artifact through this region. No evidence of acute fracture. The dens and skull base are intact. Degenerative change at C1-C2 with bony fragmentation. Soft tissues and spinal canal: No prevertebral fluid or swelling. No visible canal hematoma. Disc levels: Anterior C3-C4 fusion. Diffuse disc  space narrowing and endplate spurring. Multilevel facet hypertrophy. Upper chest: Biapical pleuroparenchymal scarring. Emphysema. 6 mm right apical pulmonary nodule, series 3, image 102 only partially included in field of view. This appears present on 05/10/2011 neck CT and is considered benign. Other: Carotid and subclavian atherosclerosis. IMPRESSION: CT face: Right supraorbital soft tissue hematoma. No orbital or facial bone fracture. CT cervical spine: Postsurgical and degenerative change in the cervical spine without acute fracture or subluxation. Electronically Signed   By: Keith Rake M.D.   On: 07/20/2019 19:22        Scheduled Meds: . sodium chloride   Intravenous Once  . amLODipine  5 mg Oral Daily  . collagenase   Topical Daily  . divalproex  250 mg Oral Daily  . divalproex  500 mg Oral QPM  . donepezil  10 mg Oral QHS  . hydrALAZINE  25 mg Oral Q8H  . PARoxetine  20 mg Oral Daily   Continuous Infusions:   LOS: 0 days     Cordelia Poche, MD [facility] 07/21/2019, 3:36 PM  If 7PM-7AM, please contact night-coverage [email or web address]

## 2019-07-22 DIAGNOSIS — Z8673 Personal history of transient ischemic attack (TIA), and cerebral infarction without residual deficits: Secondary | ICD-10-CM

## 2019-07-22 DIAGNOSIS — F0150 Vascular dementia without behavioral disturbance: Secondary | ICD-10-CM

## 2019-07-22 DIAGNOSIS — I10 Essential (primary) hypertension: Secondary | ICD-10-CM

## 2019-07-22 LAB — BASIC METABOLIC PANEL
Anion gap: 10 (ref 5–15)
BUN: 29 mg/dL — ABNORMAL HIGH (ref 8–23)
CO2: 13 mmol/L — ABNORMAL LOW (ref 22–32)
Calcium: 7.9 mg/dL — ABNORMAL LOW (ref 8.9–10.3)
Chloride: 107 mmol/L (ref 98–111)
Creatinine, Ser: 1.5 mg/dL — ABNORMAL HIGH (ref 0.61–1.24)
GFR calc Af Amer: 48 mL/min — ABNORMAL LOW (ref >60)
GFR calc non Af Amer: 41 mL/min — ABNORMAL LOW (ref >60)
Glucose, Bld: 156 mg/dL — ABNORMAL HIGH (ref 70–99)
Potassium: 4 mmol/L (ref 3.5–5.1)
Sodium: 130 mmol/L — ABNORMAL LOW (ref 135–145)

## 2019-07-22 LAB — CBC
HCT: 26.5 % — ABNORMAL LOW (ref 39.0–52.0)
Hemoglobin: 8.7 g/dL — ABNORMAL LOW (ref 13.0–17.0)
MCH: 28.7 pg (ref 26.0–34.0)
MCHC: 32.8 g/dL (ref 30.0–36.0)
MCV: 87.5 fL (ref 80.0–100.0)
Platelets: 309 10*3/uL (ref 150–400)
RBC: 3.03 MIL/uL — ABNORMAL LOW (ref 4.22–5.81)
RDW: 17.4 % — ABNORMAL HIGH (ref 11.5–15.5)
WBC: 13.8 10*3/uL — ABNORMAL HIGH (ref 4.0–10.5)
nRBC: 0 % (ref 0.0–0.2)

## 2019-07-22 LAB — BPAM RBC
Blood Product Expiration Date: 202104172359
ISSUE DATE / TIME: 202103180359
Unit Type and Rh: 5100

## 2019-07-22 LAB — TYPE AND SCREEN
ABO/RH(D): O POS
Antibody Screen: NEGATIVE
Unit division: 0

## 2019-07-22 NOTE — Progress Notes (Signed)
PROGRESS NOTE    [name]  [id number] DOB: 08-08-1929 DOA: 07/20/2019 PCP: Merrilee Seashore, MD   Brief Narrative: Manuel King is a 86 y.o. male with a history of vascular dementia, hypertension, chronic anemia. He presented from his ALF secondary to a fall. CT imaging was consistent with a right supraorbital soft tissue hematoma. He was also noted to have a fever of unknown etiology. He was started empirically on ceftriaxone and azithromycin for possible pneumonia. Urine and blood cultures obtained and are pending.   Assessment & Plan:   Principal Problem:   Fever in adult Active Problems:   Essential hypertension   History of CVA (cerebrovascular accident)   Vascular dementia ([place])   Pressure injury of skin   Anemia   CKD (chronic kidney disease) stage 3, GFR 30-59 ml/min   Fever   Fever Unknown etiology. Possibly related to a developing pneumonia. Patient has had UTIs in the past in the setting of prostate cancer hx. He also has multiple pressure injuries, most notably a right heel unstageable ulcer. Empirically started on Ceftriaxone and Azithromycin -Continue Ceftriaxone and Azithromycin -Follow blood and urine cultures  Lung infiltrate Possible pneumonia. Patient does not appear to have symptoms consistent with pneumonia but is on a dysphagia diet presumably secondary to aspiration risk. -SLP evaluation pending  Acute blood loss anemia Chronic anemia Acute blood loss secondary to fall and resultant right supraorbital soft tissue hematoma. Patient received 1 unit of PRBC. Hemoglobin from 6.6 to 8.6 and is stable. -CBC in AM  Essential hypertension Slightly uncontrolled. -Continue amlodipine and hydralazine -Hydralazine 10 mg IV prn  Vascular dementia Possible this could be contributing to patient's lack of intake recently. -Continue Aricept, Paxil, Depakote  Pressure injury Right heel. POA. Wound care consulted   DVT prophylaxis: SCDs Code  Status:   Code Status: Full Code Family Communication: Daughter on telephone Disposition Plan: Discharge back to ALF pending workup for fever vs transition to oral antibiotics   Consultants:   None  Procedures:   None  Antimicrobials:  Ceftriaxone  Azithromycin    Subjective: No concerns.   Objective: Vitals:   07/21/19 1836 07/21/19 2030 07/22/19 0643 07/22/19 1340  BP: (!) 145/77 (!) 149/66 (!) 152/70 (!) 162/51  Pulse: 88 85 100 (!) 53  Resp: 16 20 19 16   Temp: 99.7 F (37.6 C) 98.7 F (37.1 C) 100.2 F (37.9 C) (!) 100.5 F (38.1 C)  TempSrc: Oral Oral Oral Oral  SpO2: 98% 96% 99% 100%  Weight:      Height:        Intake/Output Summary (Last 24 hours) at 07/22/2019 1417 Last data filed at 07/22/2019 1130 Gross per 24 hour  Intake 1061 ml  Output 201 ml  Net 860 ml   Filed Weights   07/20/19 2313 07/21/19 0855  Weight: 92 kg 74.1 kg    Examination:  General exam: Appears calm and comfortable Respiratory system: Clear to auscultation. Respiratory effort normal. Cardiovascular system: S1 & S2 heard, RRR.  Gastrointestinal system: Abdomen is nondistended, soft and nontender. No organomegaly or masses felt. Normal bowel sounds heard. Central nervous system: Alert and oriented to self. Not oriented to place or time. No focal neurological deficits. Extremities: No edema. No calf tenderness Skin: No cyanosis. No rashes Psychiatry: Judgement and insight appear impaired.    Data Reviewed: I have personally reviewed following labs and imaging studies  CBC: Recent Labs  Lab 07/20/19 2310 07/21/19 0319 07/21/19 0946 07/22/19 1147  WBC 14.5*  --  14.7* 13.8*  NEUTROABS 10.7*  --  11.0*  --   HGB 6.6* 6.8* 8.6* 8.7*  HCT 21.6* 21.2* 26.5* 26.5*  MCV 91.5  --  87.2 87.5  PLT 298  --  282 [id number]   Basic Metabolic Panel: Recent Labs  Lab 07/20/19 2310 07/21/19 0914 07/21/19 0946 07/22/19 1147  NA 136 134* 135 130*  K 4.0 4.2 4.3 4.0  CL 112* 110  111 107  CO2 15* 14* 13* 13*  GLUCOSE 131* 99 95 156*  BUN 44* 39* 39* 29*  CREATININE 1.77* 1.59* 1.57* 1.50*  CALCIUM 8.2* 8.2* 8.3* 7.9*   GFR: Estimated Creatinine Clearance: 36.4 mL/min (A) (by C-G formula based on SCr of 1.5 mg/dL (H)). Liver Function Tests: Recent Labs  Lab 07/20/19 2310 07/21/19 0946  AST 30 35  ALT 17 18  ALKPHOS 97 101  BILITOT 0.5 0.7  PROT 6.4* 6.3*  ALBUMIN 2.3* 2.2*   No results for input(s): LIPASE, AMYLASE in the last 168 hours. No results for input(s): AMMONIA in the last 168 hours. Coagulation Profile: Recent Labs  Lab 07/20/19 2310  INR 1.5*   Cardiac Enzymes: No results for input(s): CKTOTAL, CKMB, CKMBINDEX, TROPONINI in the last 168 hours. BNP (last 3 results) No results for input(s): PROBNP in the last 8760 hours. HbA1C: No results for input(s): HGBA1C in the last 72 hours. CBG: No results for input(s): GLUCAP in the last 168 hours. Lipid Profile: No results for input(s): CHOL, HDL, LDLCALC, TRIG, CHOLHDL, LDLDIRECT in the last 72 hours. Thyroid Function Tests: No results for input(s): TSH, T4TOTAL, FREET4, T3FREE, THYROIDAB in the last 72 hours. Anemia Panel: Recent Labs    07/21/19 0945  VITAMINB12 461  FOLATE 51.2  FERRITIN 884*  TIBC 147*  IRON 18*   Sepsis Labs: Recent Labs  Lab 07/20/19 2310 07/21/19 0059  LATICACIDVEN 1.3 1.0    Recent Results (from the past 240 hour(s))  Blood Culture (routine x 2)     Status: None (Preliminary result)   Collection Time: 07/20/19 10:59 PM   Specimen: BLOOD  Result Value Ref Range Status   Specimen Description   Final    BLOOD BLOOD LEFT FOREARM Performed at [facility] [address], [place], [place] [address]    Special Requests   Final    BOTTLES DRAWN AEROBIC AND ANAEROBIC Blood Culture adequate volume Performed at [facility] [address], [place], [place] [address]    Culture   Final    NO GROWTH 1 DAY Performed  at [facility], [place] [address] [place], [place] [address]    Report Status PENDING  Incomplete  Blood Culture (routine x 2)     Status: None (Preliminary result)   Collection Time: 07/20/19 11:04 PM   Specimen: BLOOD  Result Value Ref Range Status   Specimen Description   Final    BLOOD RIGHT ANTECUBITAL Performed at [facility] [address], [place], [place] [address]    Special Requests   Final    BOTTLES DRAWN AEROBIC AND ANAEROBIC Blood Culture results may not be optimal due to an excessive volume of blood received in culture bottles Performed at [facility] [address], [place], [place] [address]    Culture   Final    NO GROWTH 1 DAY Performed at [facility], [place] [address] [place], [place] [address]    Report Status PENDING  Incomplete  SARS CORONAVIRUS  2 (TAT 6-24 HRS) Nasopharyngeal Nasopharyngeal Swab     Status: None   Collection Time: 07/21/19 12:27 AM   Specimen: Nasopharyngeal Swab  Result Value Ref Range Status   SARS Coronavirus 2 NEGATIVE NEGATIVE Final    Comment: (NOTE) SARS-CoV-2 target nucleic acids are NOT DETECTED. The SARS-CoV-2 RNA is generally detectable in upper and lower respiratory specimens during the acute phase of infection. Negative results do not preclude SARS-CoV-2 infection, do not rule out co-infections with other pathogens, and should not be used as the sole basis for treatment or other patient management decisions. Negative results must be combined with clinical observations, patient history, and epidemiological information. The expected result is Negative. Fact Sheet for Patients: SugarRoll.be Fact Sheet for Healthcare Providers: https://www.woods-mathews.com/ This test is not yet approved or cleared by the [place] FDA and  has been authorized for detection and/or diagnosis of SARS-CoV-2 by FDA under an Emergency Use Authorization  (EUA). This EUA will remain  in effect (meaning this test can be used) for the duration of the COVID-19 declaration under Section 56 4(b)(1) of the Act, 21 U.S.C. section 360bbb-3(b)(1), unless the authorization is terminated or revoked sooner. Performed at [facility], [place] [address], [place], [place] [address]   Urine culture     Status: Abnormal (Preliminary result)   Collection Time: 07/21/19  2:08 AM   Specimen: In/Out Cath Urine  Result Value Ref Range Status   Specimen Description   Final    IN/OUT CATH URINE Performed at [facility] [address], [place], [place] [address]    Special Requests   Final    NONE Performed at [facility], [place] [address], [place], [place] [address]    Culture (A)  Final    60,000 COLONIES/mL PROTEUS MIRABILIS SUSCEPTIBILITIES TO FOLLOW Performed at [facility], [place] [address], [place], [place] [address]    Report Status PENDING  Incomplete  MRSA PCR Screening     Status: None   Collection Time: 07/21/19  4:38 PM   Specimen: Nasopharyngeal  Result Value Ref Range Status   MRSA by PCR NEGATIVE NEGATIVE Final    Comment:        The GeneXpert MRSA Assay (FDA approved for NASAL specimens only), is one component of a comprehensive MRSA colonization surveillance program. It is not intended to diagnose MRSA infection nor to guide or monitor treatment for MRSA infections. Performed at [facility], [place] [address] [place], [place] [address]          Radiology Studies: DG Pelvis 1-2 Views  Result Date: 07/20/2019 CLINICAL DATA:  Pain status post fall EXAM: PELVIS - 1-2 VIEW COMPARISON:  None. FINDINGS: There is no acute displaced fracture or dislocation. There are mild-to-moderate degenerative changes of both hips. There may be a soft tissue contusion overlying the proximal right femur. IMPRESSION: No acute osseous abnormality. Electronically Signed   By:  Constance Holster M.D.   On: 07/20/2019 18:44   CT Head Wo Contrast  Result Date: 07/20/2019 CLINICAL DATA:  Dementia patient post fall out of bed. Hematoma to right side of head. EXAM: CT HEAD WITHOUT CONTRAST TECHNIQUE: Contiguous axial images were obtained from the base of the skull through the vertex without intravenous contrast. COMPARISON:  Head CT 08/19/2017 FINDINGS: Brain: No intracranial hemorrhage, mass effect, or midline shift. Stable degree of generalized atrophy. No hydrocephalus. The basilar cisterns are patent. Remote lacunar infarct in the right thalamus and basal  ganglia. Moderate chronic small vessel ischemia peers similar. No evidence of territorial infarct or acute ischemia. No extra-axial or intracranial fluid collection. Vascular: Atherosclerosis of skullbase vasculature without hyperdense vessel or abnormal calcification. Skull: No fracture or focal lesion. Sinuses/Orbits: Assessed on concurrent face CT, reported separately. Other: Right supraorbital soft tissue hematoma. IMPRESSION: 1. Right supraorbital soft tissue hematoma. No fracture or acute intracranial abnormality. 2. Stable atrophy and chronic small vessel ischemia. Remote lacunar infarcts in the right thalamus and basal ganglia. Electronically Signed   By: Keith Rake M.D.   On: 07/20/2019 19:12   CT Cervical Spine Wo Contrast  Result Date: 07/20/2019 CLINICAL DATA:  Dementia patient post fall out of bed. Hematoma to right side of head. EXAM: CT MAXILLOFACIAL WITHOUT CONTRAST CT CERVICAL SPINE WITHOUT CONTRAST TECHNIQUE: Multidetector CT imaging of the maxillofacial structures was performed. Multiplanar CT image reconstructions were also generated. A small metallic BB was placed on the right temple in order to reliably differentiate right from left. Multidetector CT imaging of the cervical spine was performed without intravenous contrast. Multiplanar CT image reconstructions were also generated. COMPARISON:  Cervical  spine CT 08/19/2017 FINDINGS: CT MAXILLOFACIAL FINDINGS Osseous: Second medic arches, nasal bone, and mandibles are intact. Nasal septum is midline. Temporomandibular joints are congruent. Patient appears edentulous with a possible single central upper incisor. Orbits: Both orbits and globes are intact. No orbital fracture. Right cataract resection. Sinuses: No sinus fracture or fluid level. Mastoid air cells are clear. No mucosal thickening. Soft tissues: Right supraorbital soft tissue hematoma. Small left parotid nodules are likely intraparotid lymph nodes. Limited intracranial: Assessed on concurrent head CT, reported separately. CT CERVICAL FINDINGS Mild motion artifact limitations. Alignment: Reversal of normal cervical lordosis. Mild broad-based dextroscoliotic curvature of the spine. No jumped or perched facets. No traumatic subluxation. Skull base and vertebrae: Anterior C3-C4 fusion. Hardware is grossly intact, motion artifact through this region. No evidence of acute fracture. The dens and skull base are intact. Degenerative change at C1-C2 with bony fragmentation. Soft tissues and spinal canal: No prevertebral fluid or swelling. No visible canal hematoma. Disc levels: Anterior C3-C4 fusion. Diffuse disc space narrowing and endplate spurring. Multilevel facet hypertrophy. Upper chest: Biapical pleuroparenchymal scarring. Emphysema. 6 mm right apical pulmonary nodule, series 3, image 102 only partially included in field of view. This appears present on 05/10/2011 neck CT and is considered benign. Other: Carotid and subclavian atherosclerosis. IMPRESSION: CT face: Right supraorbital soft tissue hematoma. No orbital or facial bone fracture. CT cervical spine: Postsurgical and degenerative change in the cervical spine without acute fracture or subluxation. Electronically Signed   By: Keith Rake M.D.   On: 07/20/2019 19:22   DG Chest Port 1 View  Result Date: 07/20/2019 CLINICAL DATA:  Fever EXAM:  PORTABLE CHEST 1 VIEW COMPARISON:  Radiograph 05/15/2019 FINDINGS: Lung volumes are slightly diminished with streaky basilar atelectatic changes. Question some more focal opacity in the left infrahilar lung. The aorta is calcified. The remaining cardiomediastinal contours are unremarkable. Stable vascular calcium in the left axilla. No acute osseous or soft tissue abnormality. Degenerative changes are present in the imaged spine and shoulders. Surgical clip projects over the right upper quadrant. IMPRESSION: Slightly diminished lung volumes with streaky basilar atelectatic changes. Question more focal opacity in the left infrahilar lung which could reflect further atelectatic change or early consolidation in the setting of fever. Electronically Signed   By: Lovena Le M.D.   On: 07/20/2019 23:24   DG Foot Complete Right  Result Date:  07/21/2019 CLINICAL DATA:  Pressure injury of right foot. EXAM: RIGHT FOOT COMPLETE - 3+ VIEW COMPARISON:  Radiograph 09/09/2011 FINDINGS: Skin irregularity about the heel. No tracking soft tissue air. No radiopaque foreign body. No abnormal density, erosion or periosteal reaction the subjacent calcaneus to suggest osteomyelitis. Hammertoe deformity of the digits limits assessment. No acute fracture. Alignment is maintained. IMPRESSION: No radiographic findings of osteomyelitis. Mild skin irregularity about the heel may be related to ulcer. No radiopaque foreign body. Electronically Signed   By: Keith Rake M.D.   On: 07/21/2019 18:03   CT Maxillofacial Wo Contrast  Result Date: 07/20/2019 CLINICAL DATA:  Dementia patient post fall out of bed. Hematoma to right side of head. EXAM: CT MAXILLOFACIAL WITHOUT CONTRAST CT CERVICAL SPINE WITHOUT CONTRAST TECHNIQUE: Multidetector CT imaging of the maxillofacial structures was performed. Multiplanar CT image reconstructions were also generated. A small metallic BB was placed on the right temple in order to reliably differentiate  right from left. Multidetector CT imaging of the cervical spine was performed without intravenous contrast. Multiplanar CT image reconstructions were also generated. COMPARISON:  Cervical spine CT 08/19/2017 FINDINGS: CT MAXILLOFACIAL FINDINGS Osseous: Second medic arches, nasal bone, and mandibles are intact. Nasal septum is midline. Temporomandibular joints are congruent. Patient appears edentulous with a possible single central upper incisor. Orbits: Both orbits and globes are intact. No orbital fracture. Right cataract resection. Sinuses: No sinus fracture or fluid level. Mastoid air cells are clear. No mucosal thickening. Soft tissues: Right supraorbital soft tissue hematoma. Small left parotid nodules are likely intraparotid lymph nodes. Limited intracranial: Assessed on concurrent head CT, reported separately. CT CERVICAL FINDINGS Mild motion artifact limitations. Alignment: Reversal of normal cervical lordosis. Mild broad-based dextroscoliotic curvature of the spine. No jumped or perched facets. No traumatic subluxation. Skull base and vertebrae: Anterior C3-C4 fusion. Hardware is grossly intact, motion artifact through this region. No evidence of acute fracture. The dens and skull base are intact. Degenerative change at C1-C2 with bony fragmentation. Soft tissues and spinal canal: No prevertebral fluid or swelling. No visible canal hematoma. Disc levels: Anterior C3-C4 fusion. Diffuse disc space narrowing and endplate spurring. Multilevel facet hypertrophy. Upper chest: Biapical pleuroparenchymal scarring. Emphysema. 6 mm right apical pulmonary nodule, series 3, image 102 only partially included in field of view. This appears present on 05/10/2011 neck CT and is considered benign. Other: Carotid and subclavian atherosclerosis. IMPRESSION: CT face: Right supraorbital soft tissue hematoma. No orbital or facial bone fracture. CT cervical spine: Postsurgical and degenerative change in the cervical spine without  acute fracture or subluxation. Electronically Signed   By: Keith Rake M.D.   On: 07/20/2019 19:22        Scheduled Meds: . sodium chloride   Intravenous Once  . amLODipine  5 mg Oral Daily  . collagenase   Topical Daily  . divalproex  250 mg Oral Daily  . divalproex  500 mg Oral QPM  . donepezil  10 mg Oral QHS  . hydrALAZINE  25 mg Oral Q8H  . mouth rinse  15 mL Mouth Rinse BID  . PARoxetine  20 mg Oral Daily   Continuous Infusions: . azithromycin Stopped (07/21/19 2356)  . cefTRIAXone (ROCEPHIN)  IV Stopped (07/21/19 2212)     LOS: 1 day     Cordelia Poche, MD [facility] 07/22/2019, 2:17 PM  If 7PM-7AM, please contact night-coverage [email or web address]

## 2019-07-22 NOTE — Evaluation (Signed)
Clinical/Bedside Swallow Evaluation Patient Details  Name: Manuel King MRN: [id number] Date of Birth: 1930-04-27  Today's Date: 07/22/2019 Time: SLP Start Time (ACUTE ONLY): [id number] SLP Stop Time (ACUTE ONLY): [id number] SLP Time Calculation (min) (ACUTE ONLY): 40 min  Past Medical History:  Past Medical History:  Diagnosis Date  . Arthritis   . Cellulitis currently   right foot  . CVA (cerebral vascular accident) ([place])   . Dementia ([place])   . Hypertension   . Prostate cancer [facility])    Past Surgical History:  Past Surgical History:  Procedure Laterality Date  . ANTERIOR CERVICAL DECOMP/DISCECTOMY FUSION  05/29/1999   Anterior C3 and C4 diskectomy, decompression of the spinal cord,bone bank graft, Synthes plate and microscope/notes 09/17/2010  . CARPAL TUNNEL RELEASE Left 09/27/1999   Archie Endo 09/17/2010  . CARPAL TUNNEL RELEASE Right 08/12/2000   Archie Endo 09/17/2010  . FOOT SURGERY     Partial excision left medial cuneiform.; Partial excision left first metatarsal./notes 09/17/2010  . INSERTION PROSTATE RADIATION SEED  09/2005   Archie Endo 09/17/2010  . NECK SURGERY     "BOIL ON MY NECK"  . spider bite surgery     on left leg   HPI:  86 yo male adm to [facility] from [place].  Pt with h/o dementia,.o. male with a history of vascular dementia, hypertension, chronic anemia. He presented from his ALF secondary to a fall. CT imaging was consistent with a right supraorbital soft tissue hematoma. He was also noted to have a fever of unknown etiology. He was started empirically on ceftriaxone and azithromycin for possible pneumonia, wounds, family wants full code. H/o ACDF C3-C4, Degenerative change at C1-C2 with bony fragmentation. + fever, possible pna on CXR.   Assessment / Plan / Recommendation Clinical Impression  Pt presents with clinical indications of mild oral dysphagia wtih resultant expectoration of secretions t/o intake and dense cheese that he could not masticate well.  No indication of  aspiration/penetration with any consistency consumed.  Concern for aspiration of secretion retention is present thus advise pt be able to expectorate throughout the day as able.     Recommend continue dys3/thin with strict precautions. Skilled intervention included determining compensation strategies and providing pt with materials to facilitate this process. SLP Visit Diagnosis: Dysphagia, oropharyngeal phase (R13.12)    Aspiration Risk  Mild aspiration risk    Diet Recommendation Dysphagia 3 (Mech soft);Thin liquid   Liquid Administration via: Straw Medication Administration: Whole meds with puree Supervision: Patient able to self feed Compensations: Slow rate;Small sips/bites;Follow solids with liquid Postural Changes: Seated upright at 90 degrees;Remain upright for at least 30 minutes after po intake    Other  Recommendations Oral Care Recommendations: Oral care BID   Follow up Recommendations None      Frequency and Duration   n/a         Prognosis   n/a     Swallow Study   General Date of Onset: 07/22/19 HPI: 86 yo male adm to [facility] from [place].  Pt with h/o dementia,.o. male with a history of vascular dementia, hypertension, chronic anemia. He presented from his ALF secondary to a fall. CT imaging was consistent with a right supraorbital soft tissue hematoma. He was also noted to have a fever of unknown etiology. He was started empirically on ceftriaxone and azithromycin for possible pneumonia, wounds, family wants full code. H/o ACDF C3-C4, Degenerative change at C1-C2 with bony fragmentation. + fever, possible pna on CXR. Diet Prior to this Study: Dysphagia 3 (  soft);Thin liquids Temperature Spikes Noted: No Respiratory Status: Room air History of Recent Intubation: No Behavior/Cognition: Alert;Cooperative Oral Cavity Assessment: Excessive secretions(secretion retention- provided cup and bag for his use to spit) Oral Care Completed by SLP: Yes Oral Cavity - Dentition: (now  lower dentrues, pt states he lost them when he was tryign to get use to them) Vision: Functional for self-feeding Self-Feeding Abilities: Needs assist Patient Positioning: Partially reclined(side lying due to pt's discomfort) Baseline Vocal Quality: Normal Volitional Cough: Cognitively unable to elicit Volitional Swallow: Unable to elicit    Oral/Motor/Sensory Function Overall Oral Motor/Sensory Function: (no focal CN deficits from tasks pt would complete)   Ice Chips Ice chips: Not tested   Thin Liquid Thin Liquid: Impaired Presentation: Straw;Self Fed Pharyngeal  Phase Impairments: Multiple swallows    Nectar Thick Nectar Thick Liquid: Not tested   Honey Thick Honey Thick Liquid: Not tested   Puree Puree: Within functional limits Presentation: Self Fed;Spoon   Solid     Solid: Impaired Oral Phase Impairments: Impaired mastication;Reduced lingual movement/coordination Oral Phase Functional Implications: Prolonged oral transit Other Comments: pt expectorated cheese that he masticated but did not clear, used puree to help clear solids, improved clearance of graham crackers compared to dense solids      Macario Golds 07/22/2019,5:30 PM  Kathleen Lime, MS [place] Office [phone]

## 2019-07-23 LAB — BASIC METABOLIC PANEL
Anion gap: 9 (ref 5–15)
BUN: 25 mg/dL — ABNORMAL HIGH (ref 8–23)
CO2: 15 mmol/L — ABNORMAL LOW (ref 22–32)
Calcium: 7.9 mg/dL — ABNORMAL LOW (ref 8.9–10.3)
Chloride: 108 mmol/L (ref 98–111)
Creatinine, Ser: 1.36 mg/dL — ABNORMAL HIGH (ref 0.61–1.24)
GFR calc Af Amer: 54 mL/min — ABNORMAL LOW (ref >60)
GFR calc non Af Amer: 46 mL/min — ABNORMAL LOW (ref >60)
Glucose, Bld: 98 mg/dL (ref 70–99)
Potassium: 3.8 mmol/L (ref 3.5–5.1)
Sodium: 132 mmol/L — ABNORMAL LOW (ref 135–145)

## 2019-07-23 LAB — CBC
HCT: 27.9 % — ABNORMAL LOW (ref 39.0–52.0)
Hemoglobin: 8.9 g/dL — ABNORMAL LOW (ref 13.0–17.0)
MCH: 28.8 pg (ref 26.0–34.0)
MCHC: 31.9 g/dL (ref 30.0–36.0)
MCV: 90.3 fL (ref 80.0–100.0)
Platelets: 297 10*3/uL (ref 150–400)
RBC: 3.09 MIL/uL — ABNORMAL LOW (ref 4.22–5.81)
RDW: 16.9 % — ABNORMAL HIGH (ref 11.5–15.5)
WBC: 10.9 10*3/uL — ABNORMAL HIGH (ref 4.0–10.5)
nRBC: 0 % (ref 0.0–0.2)

## 2019-07-23 LAB — URINE CULTURE: Culture: 60000 — AB

## 2019-07-23 MED ORDER — ENOXAPARIN SODIUM 40 MG/0.4ML ~~LOC~~ SOLN
40.0000 mg | SUBCUTANEOUS | Status: DC
  Administered 2019-07-23 – 2019-07-24 (×2): 40 mg via SUBCUTANEOUS
  Filled 2019-07-23 (×2): qty 0.4

## 2019-07-23 MED ORDER — CEFDINIR 300 MG PO CAPS
300.0000 mg | ORAL_CAPSULE | Freq: Two times a day (BID) | ORAL | Status: DC
  Administered 2019-07-23 – 2019-07-24 (×2): 300 mg via ORAL
  Filled 2019-07-23 (×3): qty 1

## 2019-07-23 MED ORDER — AZITHROMYCIN 250 MG PO TABS
500.0000 mg | ORAL_TABLET | Freq: Every day | ORAL | Status: DC
  Administered 2019-07-23: 500 mg via ORAL
  Filled 2019-07-23: qty 2

## 2019-07-23 NOTE — Progress Notes (Signed)
PROGRESS NOTE    [name]  [id number] DOB: 1929-11-20 DOA: 07/20/2019 PCP: Merrilee Seashore, MD   Brief Narrative: Manuel King is a 86 y.o. male with a history of vascular dementia, hypertension, chronic anemia. He presented from his ALF secondary to a fall. CT imaging was consistent with a right supraorbital soft tissue hematoma. He was also noted to have a fever of unknown etiology. He was started empirically on ceftriaxone and azithromycin for possible pneumonia. Urine and blood cultures obtained and are pending.   Assessment & Plan:   Principal Problem:   Fever in adult Active Problems:   Essential hypertension   History of CVA (cerebrovascular accident)   Vascular dementia ([place])   Pressure injury of skin   Anemia   CKD (chronic kidney disease) stage 3, GFR 30-59 ml/min   Fever   Fever Unknown etiology. Possibly related to a developing pneumonia. Patient has had UTIs in the past in the setting of prostate cancer hx. He also has multiple pressure injuries, most notably a right heel unstageable ulcer. Empirically started on Ceftriaxone and Azithromycin. Leukocytosis improved. Urine culture significant for Proteus Mirabilis -Discontinue ceftriaxone and azithromycin IV (no IV access) and will start Cefdinir and azithromycin PO. Watch fever curve today/overnight -Follow blood and urine cultures  Lung infiltrate Possible pneumonia. Patient does not appear to have symptoms consistent with pneumonia but is on a dysphagia diet presumably secondary to aspiration risk. -SLP recommendations: Dysphagia 3 diet, thin liquis  Acute blood loss anemia Chronic anemia Acute blood loss secondary to fall and resultant right supraorbital soft tissue hematoma. Patient received 1 unit of PRBC. Hemoglobin from 6.6 to 8.6 and is stable.  Essential hypertension Slightly uncontrolled. -Continue amlodipine and hydralazine -Hydralazine 10 mg IV prn  Leg tingling Likely related to  positioning. Possibly neuropathy but less likely. Will hold off on introducing more medication at this time unless symptoms are significant. Will focus on repositioning.  Vascular dementia Possible this could be contributing to patient's lack of intake recently. Per family, patient was ambulatory prior to recent admission to SNF but is now mostly in the bed and transported via wheelchair -Continue Aricept, Paxil, Depakote -PT/OT eval  Pressure injury Right heel. POA. Wound care consulted   DVT prophylaxis: SCDs Code Status:   Code Status: Full Code Family Communication: Called daughter, no reply Disposition Plan: Discharge back to ALF pending workup for fever vs transition to oral antibiotics   Consultants:   None  Procedures:   None  Antimicrobials:  Ceftriaxone  Azithromycin    Subjective: Leg tingling. Wants some long sleeves. Want's the TV changed to channel 12-2  Objective: Vitals:   07/22/19 1340 07/22/19 2105 07/23/19 0540 07/23/19 1320  BP: (!) 162/51 128/60 124/65 (!) 119/56  Pulse: (!) 53 100 (!) 107 (!) 101  Resp: 16 18 18 16   Temp: (!) 100.5 F (38.1 C) 99.3 F (37.4 C) 100.2 F (37.9 C) 99.9 F (37.7 C)  TempSrc: Oral Oral Oral Oral  SpO2: 100% 100% 100% 99%  Weight:      Height:        Intake/Output Summary (Last 24 hours) at 07/23/2019 1522 Last data filed at 07/23/2019 1230 Gross per 24 hour  Intake 837 ml  Output 1050 ml  Net -213 ml   Filed Weights   07/20/19 2313 07/21/19 0855  Weight: 92 kg 74.1 kg    Examination:  General exam: Appears calm and comfortable. Thin. Respiratory system: Clear to auscultation. Respiratory effort normal. Cardiovascular  system: S1 & S2 heard, RRR. No murmurs, rubs, gallops or clicks. Gastrointestinal system: Abdomen is nondistended, soft and nontender. No organomegaly or masses felt. Normal bowel sounds heard. Central nervous system: Alert and oriented to person.  Extremities: No edema. No calf  tenderness Skin: No cyanosis. Psychiatry: Judgement and insight appear impaired. Mood & affect appropriate.     Data Reviewed: I have personally reviewed following labs and imaging studies  CBC: Recent Labs  Lab 07/20/19 2310 07/21/19 0319 07/21/19 0946 07/22/19 1147 07/23/19 0512  WBC 14.5*  --  14.7* 13.8* 10.9*  NEUTROABS 10.7*  --  11.0*  --   --   HGB 6.6* 6.8* 8.6* 8.7* 8.9*  HCT 21.6* 21.2* 26.5* 26.5* 27.9*  MCV 91.5  --  87.2 87.5 90.3  PLT 298  --  282 309 [id number]   Basic Metabolic Panel: Recent Labs  Lab 07/20/19 2310 07/21/19 0914 07/21/19 0946 07/22/19 1147 07/23/19 0512  NA 136 134* 135 130* 132*  K 4.0 4.2 4.3 4.0 3.8  CL 112* 110 111 107 108  CO2 15* 14* 13* 13* 15*  GLUCOSE 131* 99 95 156* 98  BUN 44* 39* 39* 29* 25*  CREATININE 1.77* 1.59* 1.57* 1.50* 1.36*  CALCIUM 8.2* 8.2* 8.3* 7.9* 7.9*   GFR: Estimated Creatinine Clearance: 40.1 mL/min (A) (by C-G formula based on SCr of 1.36 mg/dL (H)). Liver Function Tests: Recent Labs  Lab 07/20/19 2310 07/21/19 0946  AST 30 35  ALT 17 18  ALKPHOS 97 101  BILITOT 0.5 0.7  PROT 6.4* 6.3*  ALBUMIN 2.3* 2.2*   No results for input(s): LIPASE, AMYLASE in the last 168 hours. No results for input(s): AMMONIA in the last 168 hours. Coagulation Profile: Recent Labs  Lab 07/20/19 2310  INR 1.5*   Cardiac Enzymes: No results for input(s): CKTOTAL, CKMB, CKMBINDEX, TROPONINI in the last 168 hours. BNP (last 3 results) No results for input(s): PROBNP in the last 8760 hours. HbA1C: No results for input(s): HGBA1C in the last 72 hours. CBG: No results for input(s): GLUCAP in the last 168 hours. Lipid Profile: No results for input(s): CHOL, HDL, LDLCALC, TRIG, CHOLHDL, LDLDIRECT in the last 72 hours. Thyroid Function Tests: No results for input(s): TSH, T4TOTAL, FREET4, T3FREE, THYROIDAB in the last 72 hours. Anemia Panel: Recent Labs    07/21/19 0945  VITAMINB12 461  FOLATE 51.2  FERRITIN 884*    TIBC 147*  IRON 18*   Sepsis Labs: Recent Labs  Lab 07/20/19 2310 07/21/19 0059  LATICACIDVEN 1.3 1.0    Recent Results (from the past 240 hour(s))  Blood Culture (routine x 2)     Status: None (Preliminary result)   Collection Time: 07/20/19 10:59 PM   Specimen: BLOOD  Result Value Ref Range Status   Specimen Description   Final    BLOOD BLOOD LEFT FOREARM Performed at [facility] [address] [place], [place] [address]    Special Requests   Final    BOTTLES DRAWN AEROBIC AND ANAEROBIC Blood Culture adequate volume Performed at [facility] [address] [place], [place] [address]    Culture   Final    NO GROWTH 2 DAYS Performed at [facility] [address] [place], [place] [address]    Report Status PENDING  Incomplete  Blood Culture (routine x 2)     Status: None (Preliminary result)   Collection Time: 07/20/19 11:04 PM   Specimen: BLOOD  Result Value Ref  Range Status   Specimen Description   Final    BLOOD RIGHT ANTECUBITAL Performed at [facility] [address], [place], [place] [address]    Special Requests   Final    BOTTLES DRAWN AEROBIC AND ANAEROBIC Blood Culture results may not be optimal due to an excessive volume of blood received in culture bottles Performed at [facility] [address] [place], [place] [address]    Culture   Final    NO GROWTH 2 DAYS Performed at [facility] [address], [place], [place] [address]    Report Status PENDING  Incomplete  SARS CORONAVIRUS 2 (TAT 6-24 HRS) Nasopharyngeal Nasopharyngeal Swab     Status: None   Collection Time: 07/21/19 12:27 AM   Specimen: Nasopharyngeal Swab  Result Value Ref Range Status   SARS Coronavirus 2 NEGATIVE NEGATIVE Final    Comment: (NOTE) SARS-CoV-2 target nucleic acids are NOT DETECTED. The SARS-CoV-2 RNA is generally detectable in upper and lower respiratory specimens during  the acute phase of infection. Negative results do not preclude SARS-CoV-2 infection, do not rule out co-infections with other pathogens, and should not be used as the sole basis for treatment or other patient management decisions. Negative results must be combined with clinical observations, patient history, and epidemiological information. The expected result is Negative. Fact Sheet for Patients: SugarRoll.be Fact Sheet for Healthcare Providers: https://www.woods-mathews.com/ This test is not yet approved or cleared by the [place] FDA and  has been authorized for detection and/or diagnosis of SARS-CoV-2 by FDA under an Emergency Use Authorization (EUA). This EUA will remain  in effect (meaning this test can be used) for the duration of the COVID-19 declaration under Section 56 4(b)(1) of the Act, 21 U.S.C. section 360bbb-3(b)(1), unless the authorization is terminated or revoked sooner. Performed at [facility], [place] [address], [place], [place] [address]   Urine culture     Status: Abnormal   Collection Time: 07/21/19  2:08 AM   Specimen: In/Out Cath Urine  Result Value Ref Range Status   Specimen Description   Final    IN/OUT CATH URINE Performed at [facility] [address], [place], [place] [address]    Special Requests   Final    NONE Performed at [facility], [place] [address] [place], [place] [address]    Culture 60,000 COLONIES/mL PROTEUS MIRABILIS (A)  Final   Report Status 07/23/2019 FINAL  Final   Organism ID, Bacteria PROTEUS MIRABILIS (A)  Final      Susceptibility   Proteus mirabilis - MIC*    AMPICILLIN <=2 SENSITIVE Sensitive     CEFAZOLIN <=4 SENSITIVE Sensitive     CEFTRIAXONE <=0.25 SENSITIVE Sensitive     CIPROFLOXACIN 2 INTERMEDIATE Intermediate     GENTAMICIN <=1 SENSITIVE Sensitive     IMIPENEM 4 SENSITIVE Sensitive     NITROFURANTOIN 128 RESISTANT Resistant      TRIMETH/SULFA <=20 SENSITIVE Sensitive     AMPICILLIN/SULBACTAM <=2 SENSITIVE Sensitive     PIP/TAZO <=4 SENSITIVE Sensitive     * 60,000 COLONIES/mL PROTEUS MIRABILIS  MRSA PCR Screening     Status: None   Collection Time: 07/21/19  4:38 PM   Specimen: Nasopharyngeal  Result Value Ref Range Status   MRSA by PCR NEGATIVE NEGATIVE Final    Comment:        The GeneXpert MRSA Assay (FDA approved for NASAL specimens only), is one component of a  comprehensive MRSA colonization surveillance program. It is not intended to diagnose MRSA infection nor to guide or monitor treatment for MRSA infections. Performed at [facility], [place] [address], [place], [place] [address]          Radiology Studies: DG Foot Complete Right  Result Date: 07/21/2019 CLINICAL DATA:  Pressure injury of right foot. EXAM: RIGHT FOOT COMPLETE - 3+ VIEW COMPARISON:  Radiograph 09/09/2011 FINDINGS: Skin irregularity about the heel. No tracking soft tissue air. No radiopaque foreign body. No abnormal density, erosion or periosteal reaction the subjacent calcaneus to suggest osteomyelitis. Hammertoe deformity of the digits limits assessment. No acute fracture. Alignment is maintained. IMPRESSION: No radiographic findings of osteomyelitis. Mild skin irregularity about the heel may be related to ulcer. No radiopaque foreign body. Electronically Signed   By: Keith Rake M.D.   On: 07/21/2019 18:03        Scheduled Meds: . sodium chloride   Intravenous Once  . amLODipine  5 mg Oral Daily  . azithromycin  500 mg Oral Daily  . cefdinir  300 mg Oral Q12H  . collagenase   Topical Daily  . divalproex  250 mg Oral Daily  . divalproex  500 mg Oral QPM  . donepezil  10 mg Oral QHS  . enoxaparin (LOVENOX) injection  40 mg Subcutaneous Q24H  . hydrALAZINE  25 mg Oral Q8H  . mouth rinse  15 mL Mouth Rinse BID  . PARoxetine  20 mg Oral Daily   Continuous Infusions:    LOS: 2 days      Cordelia Poche, MD [facility] 07/23/2019, 3:22 PM  If 7PM-7AM, please contact night-coverage [email or web address]

## 2019-07-24 DIAGNOSIS — D62 Acute posthemorrhagic anemia: Secondary | ICD-10-CM

## 2019-07-24 DIAGNOSIS — D649 Anemia, unspecified: Secondary | ICD-10-CM

## 2019-07-24 MED ORDER — CEFDINIR 300 MG PO CAPS: 300.0000 mg | ORAL_CAPSULE | Freq: Two times a day (BID) | ORAL | 0 refills | Status: AC

## 2019-07-24 MED ORDER — AZITHROMYCIN 250 MG PO TABS: 500.0000 mg | ORAL_TABLET | Freq: Once | ORAL | 0 refills | Status: AC

## 2019-07-24 NOTE — Progress Notes (Addendum)
CSW received a call from RN CM that per provider pt is ready for D/C and confirmation needs to be obtained from [facility] that pt can D/C back without an updated COVID test as last test was on 3/18.  CSW called and was told pt will be going to Room M2 and to please fax covid test results to fax: [phone].  CSW spoke to Ronnell Guadalajara at [facility] and was told pt will be going to room M-2 and has been accepted for return and that the pt can arrive on Sun 3/21.  The pt's accepting doctor is the SNF MD.  The number for report is [phone] and to please ask for the [facility] for report.  CSW will fax COVID results from 3/18 to [phone] at [facility] request.  CSW will update RN and EDP.  Alphonse Guild. Breanna Shorkey, Latanya Presser, LCAS Clinical Social Worker Ph: [phone]

## 2019-07-24 NOTE — Progress Notes (Signed)
CSW spoke to Ronnell Guadalajara at [facility] and was told pt will be going to room M-2 and has been accepted for return and that the pt can arrive on Sun 3/21.  The pt's accepting doctor is the SNF MD.  The number for report is [phone] and to please ask for the Med [facility]ke[facility] for report.  CSW will fax COVID results from 3/18 to [phone] at [facility] request.  CSW will continue to follow for D/C needs.  Alphonse Guild. Lia Vigilante  MSW, LCSW, LCAS, CSI Transitions of Care Clinical Social Worker Care Coordination Department Ph: [phone]

## 2019-07-24 NOTE — Progress Notes (Signed)
[facility] called. RN updated and D/C packet left at nursing station with 6th floor admin.  Please reconsult if future social work needs arise.  CSW signing off, as social work intervention is no longer needed.  Alphonse Guild. Miko Sirico  MSW, LCSW, LCAS, CSI Transitions of Care Clinical Social Worker Care Coordination Department Ph: [phone]

## 2019-07-24 NOTE — Evaluation (Signed)
Physical Therapy Evaluation Patient Details Name: Manuel King MRN: [id number] DOB: 26-Mar-1930 Today's Date: 07/24/2019   History of Present Illness  Manuel King is a 86 y.o. male with a history of vascular dementia, hypertension, CVA, chronic anemia. He presented from his ALF secondary to a fall. CT imaging was consistent with a right supraorbital soft tissue hematoma. He was also noted to have a fever of unknown etiology  Clinical Impression  PTA pt living at [facility] with assumed assist for BADLs and mobility (pt is poor historian). At time of eval, pt able to complete bed mobility at max A +2 to sit EOB. Once seated EOB, pt needing intermittent mod-max A to maintain sitting balance. Attempted sit <> stand with max A +2 and RW with difficulty achieving full stand and significant posterior lean. Pt is able to orient to place, but has cognitive impairments consistent with dementia diagnosis at baseline. Recommend pt return to ALF with PT services to increase independence with mobility.        Follow Up Recommendations Home health PT    Equipment Recommendations  None recommended by PT    Recommendations for Other Services       Precautions / Restrictions Precautions Precautions: Fall Precaution Comments: baseline LUE hemi Restrictions Weight Bearing Restrictions: No      Mobility  Bed Mobility Overal bed mobility: Needs Assistance Bed Mobility: Supine to Sit;Sit to Supine     Supine to sit: Max assist;+2 for physical assistance;+2 for safety/equipment Sit to supine: Max assist;+2 for physical assistance;+2 for safety/equipment   General bed mobility comments: max A+2 to sit EOB helicopter style with bed pad. Pt with little initiation  Transfers Overall transfer level: Needs assistance Equipment used: Rolling walker (2 wheeled) Transfers: Sit to/from Stand Sit to Stand: Max assist;+2 physical assistance;+2 safety/equipment         General transfer  comment: max A +2 to reach partial stand. Pt with significant posterior lean and difficulty weight shifting to maintain static stand  Ambulation/Gait                Stairs            Wheelchair Mobility    Modified Rankin (Stroke Patients Only)       Balance Overall balance assessment: Needs assistance Sitting-balance support: Bilateral upper extremity supported;Feet supported Sitting balance-Leahy Scale: Poor Sitting balance - Comments: intermittent min-mod support to maintain sitting balance EOB.   Standing balance support: Bilateral upper extremity supported;During functional activity Standing balance-Leahy Scale: Zero Standing balance comment: max A +2 from therapist and RW needed to achieve and maintain partial stand                             Pertinent Vitals/Pain Pain Assessment: No/denies pain    Home Living Family/patient expects to be discharged to:: Assisted living               Home Equipment: Walker - 2 wheels;Wheelchair - Education officer, community - power Additional Comments: from [employer]    Prior Function Level of Independence: Needs assistance   Gait / Transfers Assistance Needed: pt providing conflicting information on ability to ambulate; per chart, pt largely using wc for transport prior to admit  ADL's / Homemaking Assistance Needed: reports staff assisted with ADLs  Comments: unsure of PLOF due because pt is poor historian     Hand Dominance   Dominant Hand: Right  Extremity/Trunk Assessment   Upper Extremity Assessment Upper Extremity Assessment: Defer to OT evaluation LUE Deficits / Details: baseline hemiplegia. Is able to grasp with L hand but not volitionally extend UE    Lower Extremity Assessment Lower Extremity Assessment: Generalized weakness;Difficult to assess due to impaired cognition(Pt inconsistent with following commands)    Cervical / Trunk Assessment Cervical / Trunk Assessment: Kyphotic   Communication   Communication: HOH  Cognition Arousal/Alertness: Awake/alert Behavior During Therapy: Flat affect Overall Cognitive Status: History of cognitive impairments - at baseline                                 General Comments: history of vascular dementia at baseline. Follows simple commands with increased time and repition      General Comments      Exercises     Assessment/Plan    PT Assessment Patient needs continued PT services  PT Problem List Decreased strength;Decreased range of motion;Decreased activity tolerance;Decreased balance;Decreased mobility;Decreased cognition;Decreased knowledge of use of DME;Decreased safety awareness;Decreased knowledge of precautions       PT Treatment Interventions DME instruction;Gait training;Functional mobility training;Therapeutic activities;Therapeutic exercise;Patient/family education;Balance training    PT Goals (Current goals can be found in the Care Plan section)  Acute Rehab PT Goals Patient Stated Goal: get some blankets on me PT Goal Formulation: All assessment and education complete, DC therapy(pt to dc today)    Frequency Min 2X/week   Barriers to discharge        Co-evaluation PT/OT/SLP Co-Evaluation/Treatment: Yes Reason for Co-Treatment: For patient/therapist safety PT goals addressed during session: Mobility/safety with mobility OT goals addressed during session: ADL's and self-care       AM-PAC PT "6 Clicks" Mobility  Outcome Measure Help needed turning from your back to your side while in a flat bed without using bedrails?: A Lot Help needed moving from lying on your back to sitting on the side of a flat bed without using bedrails?: A Lot Help needed moving to and from a bed to a chair (including a wheelchair)?: A Lot Help needed standing up from a chair using your arms (e.g., wheelchair or bedside chair)?: A Lot Help needed to walk in hospital room?: Total Help needed climbing 3-5  steps with a railing? : Total 6 Click Score: 10    End of Session Equipment Utilized During Treatment: Gait belt Activity Tolerance: Patient limited by fatigue Patient left: in bed;with call bell/phone within reach Nurse Communication: Mobility status PT Visit Diagnosis: Difficulty in walking, not elsewhere classified (R26.2);Muscle weakness (generalized) (M62.81)    Time: [id number] PT Time Calculation (min) (ACUTE ONLY): 18 min   Charges:   PT Evaluation $PT Eval Moderate Complexity: 1 Mod          [place] Pager [phone] Office [phone]   Kasiyah Platter 07/24/2019, 3:41 PM

## 2019-07-24 NOTE — Evaluation (Signed)
Occupational Therapy Evaluation Patient Details Name: Manuel King MRN: [id number] DOB: 1929-12-22 Today's Date: 07/24/2019    History of Present Illness Manuel King is a 86 y.o. male with a history of vascular dementia, hypertension, CVA, chronic anemia. He presented from his ALF secondary to a fall. CT imaging was consistent with a right supraorbital soft tissue hematoma. He was also noted to have a fever of unknown etiology   Clinical Impression   PTA pt living at [facility] ALF with assumed assist for BADLs and mobility (pt is poor historian). At time of eval, pt able to complete bed mobility at max A +2 to sit EOB. Once seated EOB, pt needing intermittent mod-max A to maintain sitting balance. Attempted sit <> stand with max A +2 and RW with difficulty achieving full stand and significant posterior lean. Pt is able to orient to place, but has cognitive impairments consistent with dementia diagnosis at baseline. Recommend pt return to ALF with OT services to increase independence in table top BADL tasks. Will continue to follow per POC listed below.     Follow Up Recommendations  Other (comment)(Return to ALF with OT services)    Equipment Recommendations  None recommended by OT    Recommendations for Other Services       Precautions / Restrictions Precautions Precautions: Fall Precaution Comments: baseline LUE hemi Restrictions Weight Bearing Restrictions: No      Mobility Bed Mobility Overal bed mobility: Needs Assistance Bed Mobility: Supine to Sit;Sit to Supine     Supine to sit: Max assist;+2 for physical assistance;+2 for safety/equipment Sit to supine: Max assist;+2 for physical assistance;+2 for safety/equipment   General bed mobility comments: max A+2 to sit EOB helicopter style with bed pad. Pt with little initiation  Transfers Overall transfer level: Needs assistance Equipment used: Rolling walker (2 wheeled) Transfers: Sit to/from Stand Sit to  Stand: Max assist;+2 physical assistance;+2 safety/equipment         General transfer comment: max A +2 to reach partial stand. Pt with significant posterior lean and difficulty weight shifting to maintain static stand    Balance Overall balance assessment: Needs assistance Sitting-balance support: Bilateral upper extremity supported;Feet supported Sitting balance-Leahy Scale: Poor Sitting balance - Comments: intermittent min-mod support to maintain sitting balance EOB.   Standing balance support: Bilateral upper extremity supported;During functional activity Standing balance-Leahy Scale: Zero Standing balance comment: max A +2 from therapist and RW needed to achieve partial stand                           ADL either performed or assessed with clinical judgement   ADL Overall ADL's : Needs assistance/impaired Eating/Feeding: Moderate assistance;Sitting   Grooming: Moderate assistance;Sitting                                 General ADL Comments: Pt is otherwise total A for BADL that is not at table top level. He requires assist for sitting balance an is at least max A +2 to attempt to stand     Vision Patient Visual Report: No change from baseline       Perception     Praxis      Pertinent Vitals/Pain Pain Assessment: No/denies pain     Hand Dominance Right   Extremity/Trunk Assessment Upper Extremity Assessment Upper Extremity Assessment: Generalized weakness;LUE deficits/detail LUE Deficits / Details: baseline hemiplegia.  Is able to grasp with L hand but not volitionally extend UE   Lower Extremity Assessment Lower Extremity Assessment: Defer to PT evaluation       Communication Communication Communication: No difficulties   Cognition Arousal/Alertness: Awake/alert Behavior During Therapy: Flat affect Overall Cognitive Status: History of cognitive impairments - at baseline                                 General  Comments: history of vascular dementia at baseline. Follows simple commands with increased time and repition   General Comments       Exercises     Shoulder Instructions      Home Living Family/patient expects to be discharged to:: Assisted living                                 Additional Comments: from [facility]      Prior Functioning/Environment Level of Independence: Needs assistance        Comments: unsure of PLOF due because pt is poor historian        OT Problem List: Decreased strength;Decreased knowledge of use of DME or AE;Impaired tone;Decreased range of motion;Decreased coordination;Decreased activity tolerance;Decreased cognition;Impaired UE functional use;Impaired balance (sitting and/or standing);Decreased safety awareness      OT Treatment/Interventions: Self-care/ADL training;Therapeutic exercise;Patient/family education;Balance training;Neuromuscular education;Energy conservation;Therapeutic activities;DME and/or AE instruction;Cognitive remediation/compensation    OT Goals(Current goals can be found in the care plan section) Acute Rehab OT Goals OT Goal Formulation: Patient unable to participate in goal setting Time For Goal Achievement: 08/07/19 Potential to Achieve Goals: Good  OT Frequency: Min 2X/week   Barriers to D/C:            Co-evaluation PT/OT/SLP Co-Evaluation/Treatment: Yes Reason for Co-Treatment: For patient/therapist safety;To address functional/ADL transfers   OT goals addressed during session: ADL's and self-care;Strengthening/ROM      AM-PAC OT "6 Clicks" Daily Activity     Outcome Measure Help from another person eating meals?: A Lot Help from another person taking care of personal grooming?: A Lot Help from another person toileting, which includes using toliet, bedpan, or urinal?: Total Help from another person bathing (including washing, rinsing, drying)?: Total Help from another person to put on and  taking off regular upper body clothing?: Total Help from another person to put on and taking off regular lower body clothing?: Total 6 Click Score: 8   End of Session Equipment Utilized During Treatment: Gait belt;Rolling walker Nurse Communication: Mobility status  Activity Tolerance: Patient limited by fatigue Patient left: in bed;with call bell/phone within reach;with bed alarm set  OT Visit Diagnosis: Other abnormalities of gait and mobility (R26.89);Other symptoms and signs involving cognitive function;Muscle weakness (generalized) (M62.81)                Time: [id number] OT Time Calculation (min): 17 min Charges:  OT General Charges $OT Visit: 1 Visit OT Evaluation $OT Eval Moderate Complexity: [place], MSOT, OTR/L [facility] Office Number: [phone] Pager: [phone]  Zenovia Jarred 07/24/2019, 1:07 PM

## 2019-07-24 NOTE — Discharge Summary (Signed)
Physician Discharge Summary  Manuel King [id number] DOB: [birth year] DOA: 07/20/2019  PCP: Merrilee Seashore, MD  Admit date: 07/20/2019 Discharge date: 07/24/2019  Admitted From: ALF Disposition: ALF  Recommendations for Outpatient Follow-up:  1. Follow up with PCP in 1 week 2. Please follow up on the following pending results: None   Discharge Condition: Stable CODE STATUS: Full code Diet recommendation: Dysphagia 3 diet, thin liquid   Brief/Interim Summary:  Admission HPI written by Kristopher Oppenheim, DO   CC: fall HPI: 86 year old male with a history of vascular dementia, hypertension, chronic anemia presents from the nursing home due to a fall.  While the patient was in the ER, he spiked a temperature.  Patient recently had Covid 19 (moderna) vaccination on July 14, 2019.  Patient is demented and cannot give any history review of systems.  In ER, the patient was noted to have a low hemoglobin of 6.8.  Patient has known anemia but is usually not this low.  Repeat hemoglobin has not yet been performed.  Awaiting COVID-19 testing.  Patient will be observed in the hospital due to fever.  And to decide if the patient requires blood transfusion.    Hospital course:  Fever Unknown etiology. Possibly related to a developing pneumonia. Patient has had UTIs in the past in the setting of prostate cancer hx. He also has multiple pressure injuries, most notably a right heel unstageable ulcer. Empirically started on Ceftriaxone and Azithromycin. Leukocytosis improved. Urine culture significant for Proteus Mirabilis. Fever resolved. Will continue to treat for possible UTI. Discharge on Cefdinir and Azithromycin (for possible pneumonia)  Lung infiltrate Possible pneumonia. Patient does not appear to have symptoms consistent with pneumonia but is on a dysphagia diet presumably secondary to aspiration risk. Ceftriaxone and azithromycin as mentioned above.  Acute blood loss  anemia Chronic anemia Acute blood loss secondary to fall and resultant right supraorbital soft tissue hematoma. Patient received 1 unit of PRBC. Hemoglobin from 6.6 to 8.6 and is stable.  Orbital hematoma Secondary to fall. No fracture on CT scan. Hemoglobin stabilized after patient given 1 units of PRBC.  Acute blood loss anemia Secondary to hematoma. Received 1 unit of PRBC. Resolved.  Essential hypertension Slightly uncontrolled. Continue amlodipine and hydralazine.  Leg tingling Likely related to positioning. Possibly neuropathy but less likely. Will hold off on introducing more medication at this time unless symptoms are significant. Will focus on repositioning.  Vascular dementia Possible this could be contributing to patient's lack of intake recently. Per family, patient was ambulatory prior to recent admission to SNF but is now mostly in the bed and transported via wheelchair. Continue Aricept, Paxil, Depakote  Pressure injury Right heel. POA. Wound care recommendations: Topical treatment orders provided for bedside nurses to perform daily as follows to assist with removal of nonviable skin to right heel and left outer ankle: Apply Santyl to left outer ankle and right heel wounds Q day, then cover with moist gauze and foam dressing.  (Change foam dressing Q 3 days or PRN soiling.) Float heel to reduce pressure.  Foam dressing to protect and promote healing to right knee and left great toe.  Discharge Diagnoses:  Principal Problem:   Fever in adult Active Problems:   Essential hypertension   History of CVA (cerebrovascular accident)   Vascular dementia ([place])   Pressure injury of skin   Anemia   CKD (chronic kidney disease) stage 3, GFR 30-59 ml/min   Fever    Discharge Instructions  Allergies as of 07/24/2019   No Known Allergies     Medication List    STOP taking these medications   irbesartan 150 MG tablet Commonly known as: AVAPRO   oxybutynin 5 MG  tablet Commonly known as: DITROPAN   traMADol-acetaminophen 37.5-325 MG tablet Commonly known as: ULTRACET     TAKE these medications   acetaminophen 325 MG tablet Commonly known as: TYLENOL Take 2 tablets (650 mg total) by mouth every 6 (six) hours as needed for mild pain (or Fever >/= 101).   allopurinol 100 MG tablet Commonly known as: ZYLOPRIM Take 100 mg by mouth daily.   amLODipine 5 MG tablet Commonly known as: NORVASC Take 5 mg by mouth daily.   azithromycin 250 MG tablet Commonly known as: ZITHROMAX Take 2 tablets (500 mg total) by mouth once for 1 dose.   cefdinir 300 MG capsule Commonly known as: OMNICEF Take 1 capsule (300 mg total) by mouth 2 (two) times daily for 3 days.   divalproex 250 MG DR tablet Commonly known as: DEPAKOTE Take 250-500 mg by mouth See admin instructions. Take 250 mg  tablet in the am and 500 mg  tablets in the pm   docusate sodium 100 MG capsule Commonly known as: COLACE Take 100 mg by mouth daily.   donepezil 10 MG tablet Commonly known as: ARICEPT Take 10 mg by mouth at bedtime.   ferrous sulfate 325 (65 FE) MG EC tablet Take 325 mg by mouth 2 (two) times daily.   folic acid 1 MG tablet Commonly known as: FOLVITE Take 1 mg by mouth daily.   hydrALAZINE 25 MG tablet Commonly known as: APRESOLINE Take 1 tablet (25 mg total) by mouth every 8 (eight) hours.   NUTRA SHAKE PO Take 1 Container by mouth in the morning, at noon, and at bedtime. Mighty Shake   PARoxetine 20 MG tablet Commonly known as: PAXIL Take 20 mg by mouth daily.   pravastatin 40 MG tablet Commonly known as: PRAVACHOL Take 40 mg by mouth at bedtime.   Santyl ointment Generic drug: collagenase Apply 1 application topically daily.   Systane 0.4-0.3 % Soln Generic drug: Polyethyl Glycol-Propyl Glycol Place 2 drops into both eyes 2 (two) times daily.      Follow-up Information    [facility]   Specialty: Emergency  Medicine Why: Return as needed for new or worsening symptoms Contact information: [place] [id number] [place]       Merrilee Seashore, MD.   Specialty: Internal Medicine Contact information: [place] [address] [phone]          No Known Allergies  Consultations:  None   Procedures/Studies: DG Pelvis 1-2 Views  Result Date: 07/20/2019 CLINICAL DATA:  Pain status post fall EXAM: PELVIS - 1-2 VIEW COMPARISON:  None. FINDINGS: There is no acute displaced fracture or dislocation. There are mild-to-moderate degenerative changes of both hips. There may be a soft tissue contusion overlying the proximal right femur. IMPRESSION: No acute osseous abnormality. Electronically Signed   By: Constance Holster M.D.   On: 07/20/2019 18:44   CT Head Wo Contrast  Result Date: 07/20/2019 CLINICAL DATA:  Dementia patient post fall out of bed. Hematoma to right side of head. EXAM: CT HEAD WITHOUT CONTRAST TECHNIQUE: Contiguous axial images were obtained from the base of the skull through the vertex without intravenous contrast. COMPARISON:  Head CT 08/19/2017 FINDINGS: Brain: No intracranial hemorrhage, mass  effect, or midline shift. Stable degree of generalized atrophy. No hydrocephalus. The basilar cisterns are patent. Remote lacunar infarct in the right thalamus and basal ganglia. Moderate chronic small vessel ischemia peers similar. No evidence of territorial infarct or acute ischemia. No extra-axial or intracranial fluid collection. Vascular: Atherosclerosis of skullbase vasculature without hyperdense vessel or abnormal calcification. Skull: No fracture or focal lesion. Sinuses/Orbits: Assessed on concurrent face CT, reported separately. Other: Right supraorbital soft tissue hematoma. IMPRESSION: 1. Right supraorbital soft tissue hematoma. No fracture or acute intracranial abnormality. 2. Stable atrophy  and chronic small vessel ischemia. Remote lacunar infarcts in the right thalamus and basal ganglia. Electronically Signed   By: Keith Rake M.D.   On: 07/20/2019 19:12   CT Cervical Spine Wo Contrast  Result Date: 07/20/2019 CLINICAL DATA:  Dementia patient post fall out of bed. Hematoma to right side of head. EXAM: CT MAXILLOFACIAL WITHOUT CONTRAST CT CERVICAL SPINE WITHOUT CONTRAST TECHNIQUE: Multidetector CT imaging of the maxillofacial structures was performed. Multiplanar CT image reconstructions were also generated. A small metallic BB was placed on the right temple in order to reliably differentiate right from left. Multidetector CT imaging of the cervical spine was performed without intravenous contrast. Multiplanar CT image reconstructions were also generated. COMPARISON:  Cervical spine CT 08/19/2017 FINDINGS: CT MAXILLOFACIAL FINDINGS Osseous: Second medic arches, nasal bone, and mandibles are intact. Nasal septum is midline. Temporomandibular joints are congruent. Patient appears edentulous with a possible single central upper incisor. Orbits: Both orbits and globes are intact. No orbital fracture. Right cataract resection. Sinuses: No sinus fracture or fluid level. Mastoid air cells are clear. No mucosal thickening. Soft tissues: Right supraorbital soft tissue hematoma. Small left parotid nodules are likely intraparotid lymph nodes. Limited intracranial: Assessed on concurrent head CT, reported separately. CT CERVICAL FINDINGS Mild motion artifact limitations. Alignment: Reversal of normal cervical lordosis. Mild broad-based dextroscoliotic curvature of the spine. No jumped or perched facets. No traumatic subluxation. Skull base and vertebrae: Anterior C3-C4 fusion. Hardware is grossly intact, motion artifact through this region. No evidence of acute fracture. The dens and skull base are intact. Degenerative change at C1-C2 with bony fragmentation. Soft tissues and spinal canal: No prevertebral  fluid or swelling. No visible canal hematoma. Disc levels: Anterior C3-C4 fusion. Diffuse disc space narrowing and endplate spurring. Multilevel facet hypertrophy. Upper chest: Biapical pleuroparenchymal scarring. Emphysema. 6 mm right apical pulmonary nodule, series 3, image 102 only partially included in field of view. This appears present on 05/10/2011 neck CT and is considered benign. Other: Carotid and subclavian atherosclerosis. IMPRESSION: CT face: Right supraorbital soft tissue hematoma. No orbital or facial bone fracture. CT cervical spine: Postsurgical and degenerative change in the cervical spine without acute fracture or subluxation. Electronically Signed   By: Keith Rake M.D.   On: 07/20/2019 19:22   DG Chest Port 1 View  Result Date: 07/20/2019 CLINICAL DATA:  Fever EXAM: PORTABLE CHEST 1 VIEW COMPARISON:  Radiograph 05/15/2019 FINDINGS: Lung volumes are slightly diminished with streaky basilar atelectatic changes. Question some more focal opacity in the left infrahilar lung. The aorta is calcified. The remaining cardiomediastinal contours are unremarkable. Stable vascular calcium in the left axilla. No acute osseous or soft tissue abnormality. Degenerative changes are present in the imaged spine and shoulders. Surgical clip projects over the right upper quadrant. IMPRESSION: Slightly diminished lung volumes with streaky basilar atelectatic changes. Question more focal opacity in the left infrahilar lung which could reflect further atelectatic change or early consolidation in the setting  of fever. Electronically Signed   By: Lovena Le M.D.   On: 07/20/2019 23:24   DG Foot Complete Right  Result Date: 07/21/2019 CLINICAL DATA:  Pressure injury of right foot. EXAM: RIGHT FOOT COMPLETE - 3+ VIEW COMPARISON:  Radiograph 09/09/2011 FINDINGS: Skin irregularity about the heel. No tracking soft tissue air. No radiopaque foreign body. No abnormal density, erosion or periosteal reaction the  subjacent calcaneus to suggest osteomyelitis. Hammertoe deformity of the digits limits assessment. No acute fracture. Alignment is maintained. IMPRESSION: No radiographic findings of osteomyelitis. Mild skin irregularity about the heel may be related to ulcer. No radiopaque foreign body. Electronically Signed   By: Keith Rake M.D.   On: 07/21/2019 18:03   CT Maxillofacial Wo Contrast  Result Date: 07/20/2019 CLINICAL DATA:  Dementia patient post fall out of bed. Hematoma to right side of head. EXAM: CT MAXILLOFACIAL WITHOUT CONTRAST CT CERVICAL SPINE WITHOUT CONTRAST TECHNIQUE: Multidetector CT imaging of the maxillofacial structures was performed. Multiplanar CT image reconstructions were also generated. A small metallic BB was placed on the right temple in order to reliably differentiate right from left. Multidetector CT imaging of the cervical spine was performed without intravenous contrast. Multiplanar CT image reconstructions were also generated. COMPARISON:  Cervical spine CT 08/19/2017 FINDINGS: CT MAXILLOFACIAL FINDINGS Osseous: Second medic arches, nasal bone, and mandibles are intact. Nasal septum is midline. Temporomandibular joints are congruent. Patient appears edentulous with a possible single central upper incisor. Orbits: Both orbits and globes are intact. No orbital fracture. Right cataract resection. Sinuses: No sinus fracture or fluid level. Mastoid air cells are clear. No mucosal thickening. Soft tissues: Right supraorbital soft tissue hematoma. Small left parotid nodules are likely intraparotid lymph nodes. Limited intracranial: Assessed on concurrent head CT, reported separately. CT CERVICAL FINDINGS Mild motion artifact limitations. Alignment: Reversal of normal cervical lordosis. Mild broad-based dextroscoliotic curvature of the spine. No jumped or perched facets. No traumatic subluxation. Skull base and vertebrae: Anterior C3-C4 fusion. Hardware is grossly intact, motion artifact  through this region. No evidence of acute fracture. The dens and skull base are intact. Degenerative change at C1-C2 with bony fragmentation. Soft tissues and spinal canal: No prevertebral fluid or swelling. No visible canal hematoma. Disc levels: Anterior C3-C4 fusion. Diffuse disc space narrowing and endplate spurring. Multilevel facet hypertrophy. Upper chest: Biapical pleuroparenchymal scarring. Emphysema. 6 mm right apical pulmonary nodule, series 3, image 102 only partially included in field of view. This appears present on 05/10/2011 neck CT and is considered benign. Other: Carotid and subclavian atherosclerosis. IMPRESSION: CT face: Right supraorbital soft tissue hematoma. No orbital or facial bone fracture. CT cervical spine: Postsurgical and degenerative change in the cervical spine without acute fracture or subluxation. Electronically Signed   By: Keith Rake M.D.   On: 07/20/2019 19:22     Subjective: No concerns today.  Discharge Exam: Vitals:   07/24/19 0630 07/24/19 0842  BP: 112/74 (!) 118/43  Pulse: 91 85  Resp: 15   Temp: 97.9 F (36.6 C)   SpO2: 100% 98%   Vitals:   07/23/19 1320 07/23/19 2127 07/24/19 0630 07/24/19 0842  BP: (!) 119/56 120/64 112/74 (!) 118/43  Pulse: (!) 101 90 91 85  Resp: 16 16 15    Temp: 99.9 F (37.7 C) 98.8 F (37.1 C) 97.9 F (36.6 C)   TempSrc: Oral Oral Oral   SpO2: 99% 99% 100% 98%  Weight:      Height:        General: Pt is  alert, awake, not in acute distress Cardiovascular: RRR, S1/S2 +, no rubs, no gallops Respiratory: CTA bilaterally, no wheezing, no rhonchi Abdominal: Soft, NT, ND, bowel sounds + Extremities: no edema, no cyanosis    The results of significant diagnostics from this hospitalization (including imaging, microbiology, ancillary and laboratory) are listed below for reference.     Microbiology: Recent Results (from the past 240 hour(s))  Blood Culture (routine x 2)     Status: None (Preliminary result)    Collection Time: 07/20/19 10:59 PM   Specimen: BLOOD  Result Value Ref Range Status   Specimen Description   Final    BLOOD BLOOD LEFT FOREARM Performed at [facility] [address], [place], [place] [address]    Special Requests   Final    BOTTLES DRAWN AEROBIC AND ANAEROBIC Blood Culture adequate volume Performed at [facility] [address], [place], [place] [address]    Culture   Final    NO GROWTH 3 DAYS Performed at [facility], [place] [address], [place], [place] [address]    Report Status PENDING  Incomplete  Blood Culture (routine x 2)     Status: None (Preliminary result)   Collection Time: 07/20/19 11:04 PM   Specimen: BLOOD  Result Value Ref Range Status   Specimen Description   Final    BLOOD RIGHT ANTECUBITAL Performed at [facility] [address] [place], [place] [address]    Special Requests   Final    BOTTLES DRAWN AEROBIC AND ANAEROBIC Blood Culture results may not be optimal due to an excessive volume of blood received in culture bottles Performed at [facility] [address] [place], [place] [address]    Culture   Final    NO GROWTH 3 DAYS Performed at [facility], [place] [address] [place], [place] [address]    Report Status PENDING  Incomplete  SARS CORONAVIRUS 2 (TAT 6-24 HRS) Nasopharyngeal Nasopharyngeal Swab     Status: None   Collection Time: 07/21/19 12:27 AM   Specimen: Nasopharyngeal Swab  Result Value Ref Range Status   SARS Coronavirus 2 NEGATIVE NEGATIVE Final    Comment: (NOTE) SARS-CoV-2 target nucleic acids are NOT DETECTED. The SARS-CoV-2 RNA is generally detectable in upper and lower respiratory specimens during the acute phase of infection. Negative results do not preclude SARS-CoV-2 infection, do not rule out co-infections with other pathogens, and should not be used as the sole basis for treatment or other patient management  decisions. Negative results must be combined with clinical observations, patient history, and epidemiological information. The expected result is Negative. Fact Sheet for Patients: SugarRoll.be Fact Sheet for Healthcare Providers: https://www.woods-mathews.com/ This test is not yet approved or cleared by the [place] FDA and  has been authorized for detection and/or diagnosis of SARS-CoV-2 by FDA under an Emergency Use Authorization (EUA). This EUA will remain  in effect (meaning this test can be used) for the duration of the COVID-19 declaration under Section 56 4(b)(1) of the Act, 21 U.S.C. section 360bbb-3(b)(1), unless the authorization is terminated or revoked sooner. Performed at [facility], [place] [address], [place],  [address]   Urine culture     Status: Abnormal   Collection Time: 07/21/19  2:08 AM   Specimen: In/Out Cath Urine  Result Value Ref Range Status   Specimen Description   Final    IN/OUT CATH URINE Performed at [facility], [address] [place], [place] [address]    Special Requests   Final    NONE Performed at [facility], [place] [address] [place], [place] [address]    Culture 60,000 COLONIES/mL PROTEUS MIRABILIS (A)  Final   Report Status 07/23/2019 FINAL  Final   Organism ID, Bacteria PROTEUS MIRABILIS (A)  Final      Susceptibility   Proteus mirabilis - MIC*    AMPICILLIN <=2 SENSITIVE Sensitive     CEFAZOLIN <=4 SENSITIVE Sensitive     CEFTRIAXONE <=0.25 SENSITIVE Sensitive     CIPROFLOXACIN 2 INTERMEDIATE Intermediate     GENTAMICIN <=1 SENSITIVE Sensitive     IMIPENEM 4 SENSITIVE Sensitive     NITROFURANTOIN 128 RESISTANT Resistant     TRIMETH/SULFA <=20 SENSITIVE Sensitive     AMPICILLIN/SULBACTAM <=2 SENSITIVE Sensitive     PIP/TAZO <=4 SENSITIVE Sensitive     * 60,000 COLONIES/mL PROTEUS MIRABILIS  MRSA PCR Screening     Status: None    Collection Time: 07/21/19  4:38 PM   Specimen: Nasopharyngeal  Result Value Ref Range Status   MRSA by PCR NEGATIVE NEGATIVE Final    Comment:        The GeneXpert MRSA Assay (FDA approved for NASAL specimens only), is one component of a comprehensive MRSA colonization surveillance program. It is not intended to diagnose MRSA infection nor to guide or monitor treatment for MRSA infections. Performed at [facility], [place] [address] [place], [place] [address]      Labs: BNP (last 3 results) No results for input(s): BNP in the last 8760 hours. Basic Metabolic Panel: Recent Labs  Lab 07/20/19 2310 07/21/19 0914 07/21/19 0946 07/22/19 1147 07/23/19 0512  NA 136 134* 135 130* 132*  K 4.0 4.2 4.3 4.0 3.8  CL 112* 110 111 107 108  CO2 15* 14* 13* 13* 15*  GLUCOSE 131* 99 95 156* 98  BUN 44* 39* 39* 29* 25*  CREATININE 1.77* 1.59* 1.57* 1.50* 1.36*  CALCIUM 8.2* 8.2* 8.3* 7.9* 7.9*   Liver Function Tests: Recent Labs  Lab 07/20/19 2310 07/21/19 0946  AST 30 35  ALT 17 18  ALKPHOS 97 101  BILITOT 0.5 0.7  PROT 6.4* 6.3*  ALBUMIN 2.3* 2.2*   No results for input(s): LIPASE, AMYLASE in the last 168 hours. No results for input(s): AMMONIA in the last 168 hours. CBC: Recent Labs  Lab 07/20/19 2310 07/21/19 0319 07/21/19 0946 07/22/19 1147 07/23/19 0512  WBC 14.5*  --  14.7* 13.8* 10.9*  NEUTROABS 10.7*  --  11.0*  --   --   HGB 6.6* 6.8* 8.6* 8.7* 8.9*  HCT 21.6* 21.2* 26.5* 26.5* 27.9*  MCV 91.5  --  87.2 87.5 90.3  PLT 298  --  282 309 297   Cardiac Enzymes: No results for input(s): CKTOTAL, CKMB, CKMBINDEX, TROPONINI in the last 168 hours. BNP: Invalid input(s): POCBNP CBG: No results for input(s): GLUCAP in the last 168 hours. D-Dimer No results for input(s): DDIMER in the last 72 hours. Hgb A1c No results for input(s): HGBA1C in the last 72 hours. Lipid Profile No results for input(s): CHOL, HDL, LDLCALC, TRIG, CHOLHDL,  LDLDIRECT in the last 72 hours. Thyroid function studies No results for input(s): TSH, T4TOTAL, T3FREE, THYROIDAB in the last 72 hours.  Invalid input(s): FREET3 Anemia work up No results for input(s): VITAMINB12, FOLATE, FERRITIN, TIBC, IRON, RETICCTPCT in the last 72 hours. Urinalysis    Component Value Date/Time  COLORURINE YELLOW 07/21/2019 0150   APPEARANCEUR CLOUDY (A) 07/21/2019 0150   LABSPEC 1.010 07/21/2019 0150   PHURINE 8.0 07/21/2019 0150   GLUCOSEU NEGATIVE 07/21/2019 0150   HGBUR SMALL (A) 07/21/2019 0150   BILIRUBINUR NEGATIVE 07/21/2019 0150   KETONESUR NEGATIVE 07/21/2019 0150   PROTEINUR 100 (A) 07/21/2019 0150   UROBILINOGEN 0.2 12/21/2014 1409   NITRITE NEGATIVE 07/21/2019 0150   LEUKOCYTESUR LARGE (A) 07/21/2019 0150   Sepsis Labs Invalid input(s): PROCALCITONIN,  WBC,  LACTICIDVEN Microbiology Recent Results (from the past 240 hour(s))  Blood Culture (routine x 2)     Status: None (Preliminary result)   Collection Time: 07/20/19 10:59 PM   Specimen: BLOOD  Result Value Ref Range Status   Specimen Description   Final    BLOOD BLOOD LEFT FOREARM Performed at [facility], [place] [address], [place], [place] [address]    Special Requests   Final    BOTTLES DRAWN AEROBIC AND ANAEROBIC Blood Culture adequate volume Performed at [facility] [address] [place], [place] [address]    Culture   Final    NO GROWTH 3 DAYS Performed at [facility], [place] [address] [place], [place] [address]    Report Status PENDING  Incomplete  Blood Culture (routine x 2)     Status: None (Preliminary result)   Collection Time: 07/20/19 11:04 PM   Specimen: BLOOD  Result Value Ref Range Status   Specimen Description   Final    BLOOD RIGHT ANTECUBITAL Performed at [facility] [address], [place], [place] [address]    Special Requests   Final    BOTTLES DRAWN AEROBIC AND ANAEROBIC Blood Culture  results may not be optimal due to an excessive volume of blood received in culture bottles Performed at [facility] [address], [place], [place] [address]    Culture   Final    NO GROWTH 3 DAYS Performed at [facility], [place] [address] [place], [place] [address]    Report Status PENDING  Incomplete  SARS CORONAVIRUS 2 (TAT 6-24 HRS) Nasopharyngeal Nasopharyngeal Swab     Status: None   Collection Time: 07/21/19 12:27 AM   Specimen: Nasopharyngeal Swab  Result Value Ref Range Status   SARS Coronavirus 2 NEGATIVE NEGATIVE Final    Comment: (NOTE) SARS-CoV-2 target nucleic acids are NOT DETECTED. The SARS-CoV-2 RNA is generally detectable in upper and lower respiratory specimens during the acute phase of infection. Negative results do not preclude SARS-CoV-2 infection, do not rule out co-infections with other pathogens, and should not be used as the sole basis for treatment or other patient management decisions. Negative results must be combined with clinical observations, patient history, and epidemiological information. The expected result is Negative. Fact Sheet for Patients: SugarRoll.be Fact Sheet for Healthcare Providers: [email or web address] This test is not yet approved or cleared by the [place] FDA and  has been authorized for detection and/or diagnosis of SARS-CoV-2 by FDA under an Emergency Use Authorization (EUA). This EUA will remain  in effect (meaning this test can be used) for the duration of the COVID-19 declaration under Section 56 4(b)(1) of the Act, 21 U.S.C. section 360bbb-3(b)(1), unless the authorization is terminated or revoked sooner. Performed at [facility], [place] [address], [place], [place] [address]   Urine culture     Status: Abnormal   Collection Time: 07/21/19  2:08 AM   Specimen: In/Out Cath Urine  Result Value Ref Range Status   Specimen Description    Final    IN/OUT CATH URINE Performed at [facility] [address], [place], [place] [address]    Special Requests   Final    NONE Performed at [facility], [place] [address] [place], [place] [address]    Culture 60,000 COLONIES/mL PROTEUS MIRABILIS (A)  Final   Report Status 07/23/2019 FINAL  Final   Organism ID, Bacteria PROTEUS MIRABILIS (A)  Final      Susceptibility   Proteus mirabilis - MIC*    AMPICILLIN <=2 SENSITIVE Sensitive     CEFAZOLIN <=4 SENSITIVE Sensitive     CEFTRIAXONE <=0.25 SENSITIVE Sensitive     CIPROFLOXACIN 2 INTERMEDIATE Intermediate     GENTAMICIN <=1 SENSITIVE Sensitive     IMIPENEM 4 SENSITIVE Sensitive     NITROFURANTOIN 128 RESISTANT Resistant     TRIMETH/SULFA <=20 SENSITIVE Sensitive     AMPICILLIN/SULBACTAM <=2 SENSITIVE Sensitive     PIP/TAZO <=4 SENSITIVE Sensitive     * 60,000 COLONIES/mL PROTEUS MIRABILIS  MRSA PCR Screening     Status: None   Collection Time: 07/21/19  4:38 PM   Specimen: Nasopharyngeal  Result Value Ref Range Status   MRSA by PCR NEGATIVE NEGATIVE Final    Comment:        The GeneXpert MRSA Assay (FDA approved for NASAL specimens only), is one component of a comprehensive MRSA colonization surveillance program. It is not intended to diagnose MRSA infection nor to guide or monitor treatment for MRSA infections. Performed at [facility], [place] [address] [place], [place] [address]      Time coordinating discharge: 35 minutes  SIGNED:   Cordelia Poche, MD [facility] 07/24/2019, 12:41 PM

## 2019-07-26 LAB — CULTURE, BLOOD (ROUTINE X 2)
Culture: NO GROWTH
Culture: NO GROWTH
Special Requests: ADEQUATE

## 2019-08-08 DIAGNOSIS — H04123 Dry eye syndrome of bilateral lacrimal glands: Secondary | ICD-10-CM | POA: Diagnosis not present

## 2019-08-08 DIAGNOSIS — E782 Mixed hyperlipidemia: Secondary | ICD-10-CM | POA: Diagnosis not present

## 2019-08-08 DIAGNOSIS — K5900 Constipation, unspecified: Secondary | ICD-10-CM | POA: Diagnosis not present

## 2019-08-15 DIAGNOSIS — M1990 Unspecified osteoarthritis, unspecified site: Secondary | ICD-10-CM | POA: Diagnosis not present

## 2019-09-05 DIAGNOSIS — H04123 Dry eye syndrome of bilateral lacrimal glands: Secondary | ICD-10-CM | POA: Diagnosis not present

## 2019-09-05 DIAGNOSIS — K5900 Constipation, unspecified: Secondary | ICD-10-CM | POA: Diagnosis not present

## 2019-09-05 DIAGNOSIS — I10 Essential (primary) hypertension: Secondary | ICD-10-CM | POA: Diagnosis not present

## 2019-11-08 DIAGNOSIS — L603 Nail dystrophy: Secondary | ICD-10-CM | POA: Diagnosis not present

## 2019-11-08 DIAGNOSIS — B351 Tinea unguium: Secondary | ICD-10-CM | POA: Diagnosis not present

## 2019-11-08 DIAGNOSIS — I739 Peripheral vascular disease, unspecified: Secondary | ICD-10-CM | POA: Diagnosis not present

## 2019-11-08 DIAGNOSIS — Q845 Enlarged and hypertrophic nails: Secondary | ICD-10-CM | POA: Diagnosis not present

## 2019-12-10 DIAGNOSIS — R58 Hemorrhage, not elsewhere classified: Secondary | ICD-10-CM | POA: Diagnosis not present

## 2019-12-10 DIAGNOSIS — R6889 Other general symptoms and signs: Secondary | ICD-10-CM | POA: Diagnosis not present

## 2019-12-10 DIAGNOSIS — Z743 Need for continuous supervision: Secondary | ICD-10-CM | POA: Diagnosis not present

## 2019-12-10 DIAGNOSIS — W19XXXA Unspecified fall, initial encounter: Secondary | ICD-10-CM | POA: Diagnosis not present

## 2019-12-11 ENCOUNTER — Emergency Department (HOSPITAL_COMMUNITY): Payer: Medicare Other

## 2019-12-11 ENCOUNTER — Emergency Department (HOSPITAL_COMMUNITY)
Admission: EM | Admit: 2019-12-11 | Discharge: 2019-12-11 | Disposition: A | Discharge status: Home / Self Care | Payer: Medicare Other | Attending: Emergency Medicine | Admitting: Emergency Medicine

## 2019-12-11 DIAGNOSIS — M2550 Pain in unspecified joint: Secondary | ICD-10-CM | POA: Diagnosis not present

## 2019-12-11 DIAGNOSIS — N183 Chronic kidney disease, stage 3 unspecified: Secondary | ICD-10-CM | POA: Insufficient documentation

## 2019-12-11 DIAGNOSIS — C61 Malignant neoplasm of prostate: Secondary | ICD-10-CM | POA: Diagnosis not present

## 2019-12-11 DIAGNOSIS — Y929 Unspecified place or not applicable: Secondary | ICD-10-CM | POA: Insufficient documentation

## 2019-12-11 DIAGNOSIS — S0101XA Laceration without foreign body of scalp, initial encounter: Secondary | ICD-10-CM | POA: Diagnosis not present

## 2019-12-11 DIAGNOSIS — S0990XA Unspecified injury of head, initial encounter: Secondary | ICD-10-CM | POA: Insufficient documentation

## 2019-12-11 DIAGNOSIS — Y939 Activity, unspecified: Secondary | ICD-10-CM | POA: Diagnosis not present

## 2019-12-11 DIAGNOSIS — W010XXA Fall on same level from slipping, tripping and stumbling without subsequent striking against object, initial encounter: Secondary | ICD-10-CM | POA: Diagnosis not present

## 2019-12-11 DIAGNOSIS — Y999 Unspecified external cause status: Secondary | ICD-10-CM | POA: Insufficient documentation

## 2019-12-11 DIAGNOSIS — W19XXXA Unspecified fall, initial encounter: Secondary | ICD-10-CM

## 2019-12-11 DIAGNOSIS — I129 Hypertensive chronic kidney disease with stage 1 through stage 4 chronic kidney disease, or unspecified chronic kidney disease: Secondary | ICD-10-CM | POA: Diagnosis not present

## 2019-12-11 DIAGNOSIS — R6889 Other general symptoms and signs: Secondary | ICD-10-CM | POA: Diagnosis not present

## 2019-12-11 DIAGNOSIS — R220 Localized swelling, mass and lump, head: Secondary | ICD-10-CM | POA: Diagnosis not present

## 2019-12-11 DIAGNOSIS — R410 Disorientation, unspecified: Secondary | ICD-10-CM | POA: Diagnosis not present

## 2019-12-11 DIAGNOSIS — Z7401 Bed confinement status: Secondary | ICD-10-CM | POA: Diagnosis not present

## 2019-12-11 DIAGNOSIS — Z87891 Personal history of nicotine dependence: Secondary | ICD-10-CM | POA: Insufficient documentation

## 2019-12-11 DIAGNOSIS — Z743 Need for continuous supervision: Secondary | ICD-10-CM | POA: Diagnosis not present

## 2019-12-11 DIAGNOSIS — F0390 Unspecified dementia without behavioral disturbance: Secondary | ICD-10-CM | POA: Insufficient documentation

## 2019-12-11 DIAGNOSIS — I6381 Other cerebral infarction due to occlusion or stenosis of small artery: Secondary | ICD-10-CM | POA: Diagnosis not present

## 2019-12-11 DIAGNOSIS — R011 Cardiac murmur, unspecified: Secondary | ICD-10-CM | POA: Diagnosis not present

## 2019-12-11 LAB — CBC WITH DIFFERENTIAL/PLATELET
Abs Immature Granulocytes: 0.02 10*3/uL (ref 0.00–0.07)
Basophils Absolute: 0 10*3/uL (ref 0.0–0.1)
Basophils Relative: 0 %
Eosinophils Absolute: 0.1 10*3/uL (ref 0.0–0.5)
Eosinophils Relative: 1 %
HCT: 26 % — ABNORMAL LOW (ref 39.0–52.0)
Hemoglobin: 8 g/dL — ABNORMAL LOW (ref 13.0–17.0)
Immature Granulocytes: 0 %
Lymphocytes Relative: 22 %
Lymphs Abs: 1.8 10*3/uL (ref 0.7–4.0)
MCH: 26.5 pg (ref 26.0–34.0)
MCHC: 30.8 g/dL (ref 30.0–36.0)
MCV: 86.1 fL (ref 80.0–100.0)
Monocytes Absolute: 0.5 10*3/uL (ref 0.1–1.0)
Monocytes Relative: 6 %
Neutro Abs: 5.6 10*3/uL (ref 1.7–7.7)
Neutrophils Relative %: 71 %
Platelets: 364 10*3/uL (ref 150–400)
RBC: 3.02 MIL/uL — ABNORMAL LOW (ref 4.22–5.81)
RDW: 16 % — ABNORMAL HIGH (ref 11.5–15.5)
WBC: 8 10*3/uL (ref 4.0–10.5)
nRBC: 0 % (ref 0.0–0.2)

## 2019-12-11 LAB — BASIC METABOLIC PANEL
Anion gap: 10 (ref 5–15)
BUN: 49 mg/dL — ABNORMAL HIGH (ref 8–23)
CO2: 15 mmol/L — ABNORMAL LOW (ref 22–32)
Calcium: 8.8 mg/dL — ABNORMAL LOW (ref 8.9–10.3)
Chloride: 111 mmol/L (ref 98–111)
Creatinine, Ser: 1.67 mg/dL — ABNORMAL HIGH (ref 0.61–1.24)
GFR calc Af Amer: 42 mL/min — ABNORMAL LOW (ref >60)
GFR calc non Af Amer: 36 mL/min — ABNORMAL LOW (ref >60)
Glucose, Bld: 103 mg/dL — ABNORMAL HIGH (ref 70–99)
Potassium: 4 mmol/L (ref 3.5–5.1)
Sodium: 136 mmol/L (ref 135–145)

## 2019-12-11 NOTE — ED Provider Notes (Signed)
[facility] EMERGENCY DEPARTMENT Provider Note   CSN: [id number] Arrival date & time: 12/11/19  0049     History Chief Complaint  Patient presents with  . Head Injury    Manuel King is a 86 y.o. male who presents from [facility] after falling in his sustaining a head injury.  Patient reports that he was transferring from his bed to his wheelchair and slipped.  He denies any dizziness prior to the fall.  He reports that he hit his head but otherwise has no pain anywhere else. He denies any fevers, chills, nausea, vomiting, chest pain, loss of consciousness.     Past Medical History:  Diagnosis Date  . Arthritis   . Cellulitis currently   right foot  . CVA (cerebral vascular accident) ([place])   . Dementia ([place])   . Hypertension   . Prostate cancer [facility])     Patient Active Problem List   Diagnosis Date Noted  . Fever in adult 07/21/2019  . Anemia 07/21/2019  . CKD (chronic kidney disease) stage 3, GFR 30-59 ml/min 07/21/2019  . Fever 07/21/2019  . Pressure injury of skin 05/11/2019  . Low vitamin B12 level 01/09/2019  . HLD (hyperlipidemia) 08/18/2017  . Dementia ([place]) 08/18/2017  . Iron deficiency anemia 08/18/2017  . Acute renal failure superimposed on stage 3 chronic kidney disease ([place]) 08/18/2017  . Urinary urgency 01/25/2015  . Ambulatory dysfunction 12/21/2014  . Generalized weakness 07/18/2014  . Fall 07/18/2014  . Malnutrition of moderate degree ([place]) 02/03/2014  . Bradycardia 02/01/2014  . Gout 09/16/2011  . Falls 09/12/2011  . History of CVA (cerebrovascular accident) 05/10/2011  . Vascular dementia ([place]) 05/10/2011  . SLEEP APNEA 11/16/2008  . BRADYCARDIA 09/29/2008  . FATIGUE, CHRONIC 09/29/2008  . Essential hypertension 09/28/2008  . PROSTATE CANCER, HX OF 09/28/2008    Past Surgical History:  Procedure Laterality Date  . ANTERIOR CERVICAL DECOMP/DISCECTOMY FUSION  05/29/1999   Anterior C3 and C4 diskectomy,  decompression of the spinal cord,bone bank graft, Synthes plate and microscope/notes 09/17/2010  . CARPAL TUNNEL RELEASE Left 09/27/1999   Archie Endo 09/17/2010  . CARPAL TUNNEL RELEASE Right 08/12/2000   Archie Endo 09/17/2010  . FOOT SURGERY     Partial excision left medial cuneiform.; Partial excision left first metatarsal./notes 09/17/2010  . INSERTION PROSTATE RADIATION SEED  09/2005   Archie Endo 09/17/2010  . NECK SURGERY     "BOIL ON MY NECK"  . spider bite surgery     on left leg       Family History  Problem Relation Age of Onset  . Hypotension Mother   . Diabetes type II Mother   . CAD Mother   . Hypotension Father   . CAD Father   . CAD Sister     Social History   Tobacco Use  . Smoking status: Former Smoker    Years: 15.00  . Smokeless tobacco: Never Used  Substance Use Topics  . Alcohol use: No    Alcohol/week: 1.0 standard drink    Types: 1 Glasses of wine per week    Comment: none in 2 years or more  . Drug use: No    Home Medications Prior to Admission medications   Medication Sig Start Date End Date Taking? Authorizing Provider  allopurinol (ZYLOPRIM) 100 MG tablet Take 100 mg by mouth daily.   Yes [provider]  amLODipine (NORVASC) 10 MG tablet Take 10 mg by mouth daily.   Yes [provider]  benazepril (LOTENSIN) 20 MG tablet Take 20 mg by mouth at bedtime.   Yes [provider]  divalproex (DEPAKOTE) 250 MG DR tablet Take 250 mg by mouth daily.    Yes [provider]  docusate sodium (COLACE) 100 MG capsule Take 100 mg by mouth daily.   Yes [provider]  donepezil (ARICEPT) 10 MG tablet Take 10 mg by mouth at bedtime.   Yes [provider]  ferrous sulfate 325 (65 FE) MG EC tablet Take 325 mg by mouth 2 (two) times daily.   Yes [provider]  folic acid (FOLVITE) 1 MG tablet Take 1 mg by mouth daily.   Yes [provider]  hydrALAZINE (APRESOLINE) 25 MG tablet Take 1 tablet (25 mg  total) by mouth every 8 (eight) hours. 12/25/14  Yes Theodis Blaze, MD  Melatonin 10 MG SUBL Place 10 mg under the tongue at bedtime.   Yes [provider]  Nutritional Supplements (NUTRA SHAKE PO) Take 1 Container by mouth in the morning and at bedtime. Mighty Shake    Yes [provider]  polyvinyl alcohol (LIQUIFILM TEARS) 1.4 % ophthalmic solution Place 1 drop into both eyes in the morning and at bedtime.   Yes [provider]  acetaminophen (TYLENOL) 325 MG tablet Take 2 tablets (650 mg total) by mouth every 6 (six) hours as needed for mild pain (or Fever >/= 101). 01/11/19   Eugenie Filler, MD  collagenase (SANTYL) ointment Apply 1 application topically daily.    [provider]  PARoxetine (PAXIL) 20 MG tablet Take 20 mg by mouth daily.    [provider]  Polyethyl Glycol-Propyl Glycol (SYSTANE) 0.4-0.3 % SOLN Place 2 drops into both eyes 2 (two) times daily.     [provider]  pravastatin (PRAVACHOL) 40 MG tablet Take 40 mg by mouth at bedtime.     [provider]    Allergies    Patient has no known allergies.  Review of Systems   Review of Systems  Constitutional: Negative for appetite change and fever.  HENT: Negative for congestion, rhinorrhea and sore throat.   Eyes: Negative for pain.  Respiratory: Negative for cough and shortness of breath.   Cardiovascular: Negative for chest pain and leg swelling.  Endocrine: Negative.   Genitourinary: Negative.   Musculoskeletal: Negative for myalgias and neck pain.  Skin: Positive for wound.  Allergic/Immunologic: Negative.   Neurological: Negative for dizziness, seizures, speech difficulty, weakness, light-headedness and headaches.  Hematological: Negative.   Psychiatric/Behavioral: Negative.     Physical Exam Updated Vital Signs BP 138/76   Pulse 80   Temp 98.5 F (36.9 C) (Oral)   Resp (!) 24   SpO2 100%   Physical Exam Constitutional:      Comments:  Chronically ill appering, cachectic   HENT:     Head: Normocephalic.     Comments: 2 cm laceration for left forehead. Hemostatic.     Nose: Nose normal.     Mouth/Throat:     Mouth: Mucous membranes are moist.     Pharynx: Oropharynx is clear.  Eyes:     General:        Left eye: Discharge present.    Pupils: Pupils are equal, round, and reactive to light.     Comments: Left eye does not spontaneously open, no hematoma or scleral injection appreciated. Right eye spontaneosly opens without erythema or jaundice.   Neck:     Comments: In c collar  Cardiovascular:     Rate and Rhythm: Normal rate and regular rhythm.     Heart sounds: Murmur heard.   Pulmonary:     Effort: Pulmonary effort is normal.     Breath sounds: Normal breath sounds.  Abdominal:     General: Abdomen is flat. Bowel sounds are normal.     Palpations: Abdomen is soft.  Musculoskeletal:     Right lower leg: No edema.     Left lower leg: No edema.     Comments: Left arm and leg contracture  Skin:    General: Skin is warm and dry.     Capillary Refill: Capillary refill takes less than 2 seconds.  Neurological:     General: No focal deficit present.     Mental Status: He is alert.     Comments: Oriented to self and month.   Psychiatric:        Mood and Affect: Mood normal.        Behavior: Behavior normal.     ED Results / Procedures / Treatments   Labs (all labs ordered are listed, but only abnormal results are displayed) Labs Reviewed  CBC WITH DIFFERENTIAL/PLATELET  BASIC METABOLIC PANEL  URINALYSIS, ROUTINE W REFLEX MICROSCOPIC    EKG None  Radiology CT Head Wo Contrast  Result Date: 12/11/2019 CLINICAL DATA:  Fall EXAM: CT HEAD WITHOUT CONTRAST TECHNIQUE: Contiguous axial images were obtained from the base of the skull through the vertex without intravenous contrast. COMPARISON:  July 20, 2019 FINDINGS: Brain: No evidence of acute territorial infarction, hemorrhage, hydrocephalus,extra-axial  collection or mass lesion/mass effect. There is dilatation the ventricles and sulci consistent with age-related atrophy. Low-attenuation changes in the deep white matter consistent with small vessel ischemia. Prior lacunar infarct seen within the right basal ganglia. Vascular: No hyperdense vessel or unexpected calcification. Skull: The skull is intact. No fracture or focal lesion identified. Sinuses/Orbits: The visualized paranasal sinuses and mastoid air cells are clear. The orbits and globes intact. Other: Mild soft tissue swelling seen over the left frontal skull. For IMPRESSION: No acute intracranial abnormality. Findings consistent with age related atrophy and chronic small vessel ischemia Prior right basal ganglia lacunar infarct. Small area of soft tissue swelling over the left frontal skull. Electronically Signed   By: Prudencio Pair M.D.   On: 12/11/2019 01:55    Procedures .Marland KitchenLaceration Repair  Date/Time: 12/11/2019 3:10 AM Performed by: Wilber Oliphant, MD Authorized by: Merrily Pew, MD   Consent:    Consent obtained:  Verbal   Consent given by:  Patient   Risks discussed:  Infection and poor cosmetic result   Alternatives discussed:  No treatment Anesthesia (see MAR for exact dosages):    Anesthesia method:  None Laceration details:    Location:  Scalp   Scalp location:  Frontal   Length (cm):  2   Depth (mm):  1 Repair type:    Repair type:  Simple Exploration:    Hemostasis achieved with:  Direct pressure   Contaminated: no   Treatment:    Area cleansed with:  Saline   Amount of cleaning:  Standard   Irrigation solution:  Sterile saline   Irrigation method:  Syringe   Visualized foreign bodies/material removed: no   Skin repair:    Repair method:  Tissue adhesive and Steri-Strips Approximation:    Approximation:  Close Post-procedure details:    Dressing:  Open (no dressing)   Patient tolerance of procedure:  Tolerated well, no immediate complications   (  including  critical care time)  Medications Ordered in ED Medications - No data to display  ED Course  I have reviewed the triage vital signs and the nursing notes.  Pertinent labs & imaging results that were available during my care of the patient were reviewed by me and considered in my medical decision making (see chart for details).    MDM Rules/Calculators/A&P                          86 year old male with history of vascular dementia, hypertension, chronic anemia presents from ALF due to fall.  Patient denies any dizziness during fall but reports that he slipped when transferring from his bed to wheelchair.  On physical exam, there is a 2 cm held laceration on his left forehead but otherwise nontraumatic.  He denies any other pain.  He is not on any blood thinners.  Will obtain CT head without to rule out trauma. Basic metabolic panel given polypharmacy and CBC given history of anemia to rule out other possible causes of fall.    Patient tolerated laceration debribement and repair well. No changes in mental status or neuro exam since last exam. CT negative for acute findings. Awaiting lab results.   Patient will continue care with Dr. Dayna Barker.    Final Clinical Impression(s) / ED Diagnoses Final diagnoses:  Injury of head, initial encounter  Minor head injury, initial encounter  Laceration of scalp, initial encounter  Fall, initial encounter    Rx / DC Orders ED Discharge Orders    None       Wilber Oliphant, MD 12/11/19 1017    Merrily Pew, MD 12/11/19 [phone]

## 2019-12-11 NOTE — ED Triage Notes (Signed)
BIB [facility] after nursing staff at [facility] reported the patient had fallen after transferring from wheelchair to bed. EMS reports L sided hematoma. Pt denies LOC. PT has hx of stroke, HTN, dementia and is contracted on left side. Upon arrival, pt A X O x 3 ( person, situation, place). B/P 155/65, 02 sat 100% RA, HR 79. Pt arrived in C-collar.

## 2019-12-11 NOTE — ED Notes (Signed)
Called [facility] for transport to [facility]

## 2019-12-11 NOTE — ED Notes (Signed)
[facility] [place] AN UPDATE [phone]

## 2019-12-28 DIAGNOSIS — Z9229 Personal history of other drug therapy: Secondary | ICD-10-CM | POA: Diagnosis not present

## 2019-12-28 DIAGNOSIS — Z79891 Long term (current) use of opiate analgesic: Secondary | ICD-10-CM | POA: Diagnosis not present

## 2019-12-28 DIAGNOSIS — Z79899 Other long term (current) drug therapy: Secondary | ICD-10-CM | POA: Diagnosis not present

## 2020-03-05 DEATH — deceased

## 2020-09-26 IMAGING — CT CT CERVICAL SPINE W/O CM
4 of 9 series · 10 of 34 positions shown, 11 images · non-contrast
Comparison: Cervical spine CT 08/19/2017

CLINICAL DATA: Dementia patient post fall out of bed. Hematoma to
right side of head.

EXAM:
CT MAXILLOFACIAL WITHOUT CONTRAST
CT CERVICAL SPINE WITHOUT CONTRAST
TECHNIQUE: Multidetector CT imaging of the maxillofacial structures was
performed. Multiplanar CT image reconstructions were also generated.
A small metallic BB was placed on the right temple in order to
reliably differentiate right from left.
Multidetector CT imaging of the cervical spine was performed without
intravenous contrast. Multiplanar CT image reconstructions were also
generated.

[Series 4: c spine soft · axial · 0.35mm/px · z∈[+1532,+1600]mm · 2 of 102 slices shown]
[im 34/102  soft-tissue]
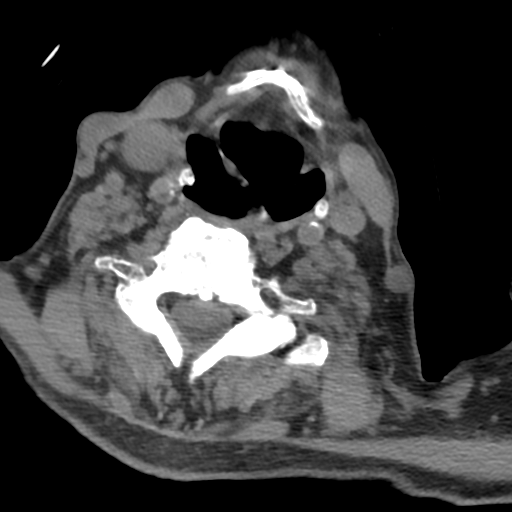
[im 68/102  soft-tissue]
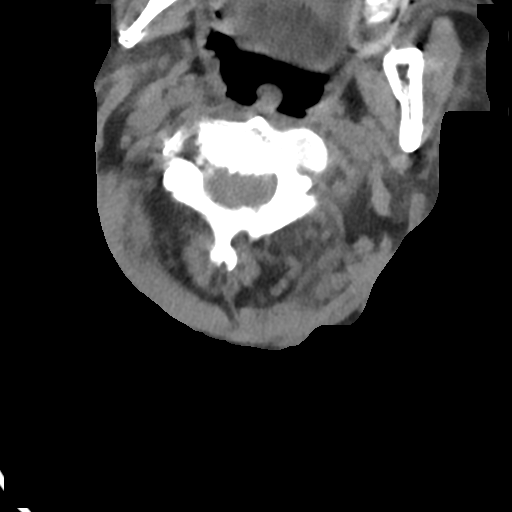

[Series 6: sagittal bone · sagittal · 0.30mm/px · 2 of 56 slices shown]
[im 19/56  bone]
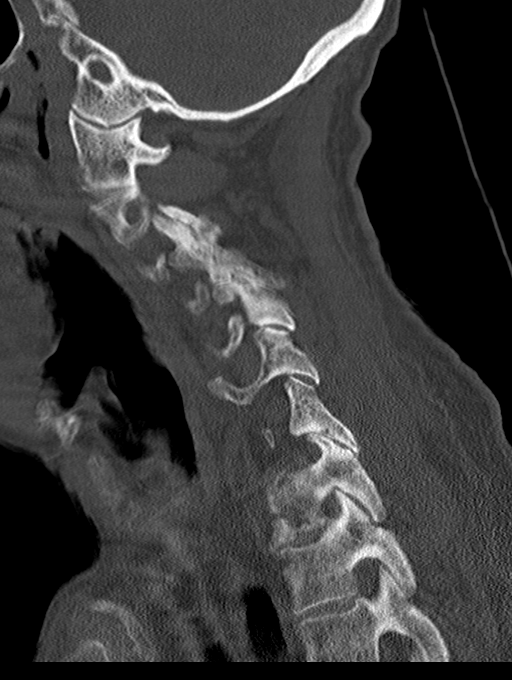
[im 37/56  bone]
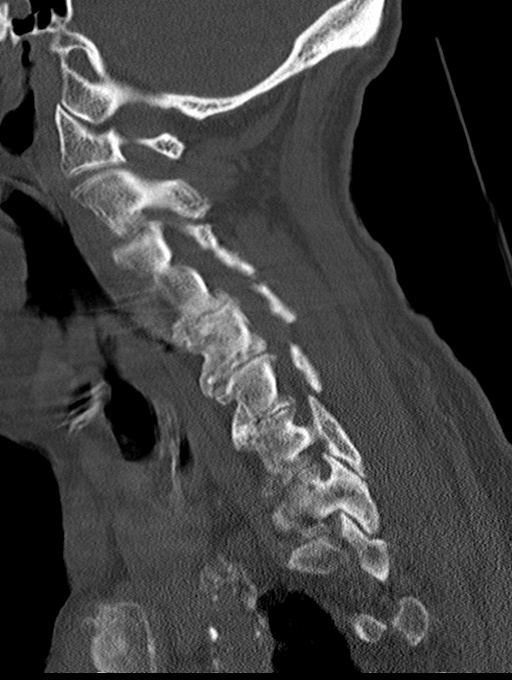

[Series 8: orthogonal axials · axial · 0.23mm/px · z∈[+1452,+1633]mm · 3 of 105 slices shown, 4 images]
[im 1/105  soft-tissue]
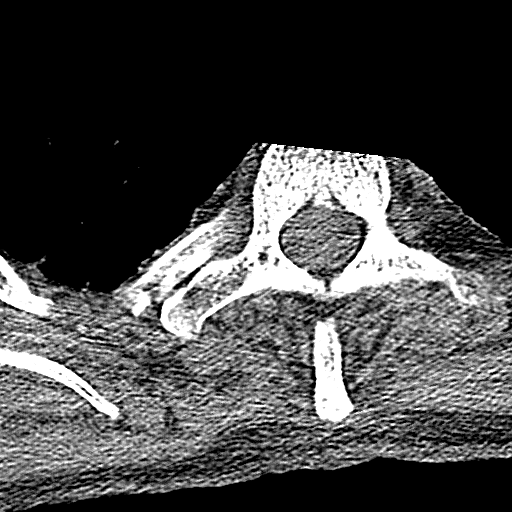
[im 1/105  bone]
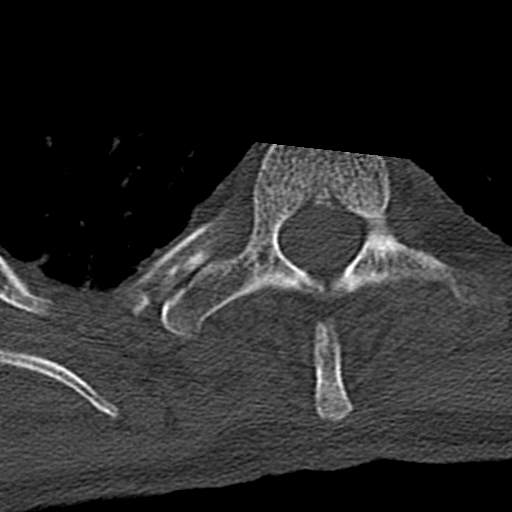
[im 53/105  bone]
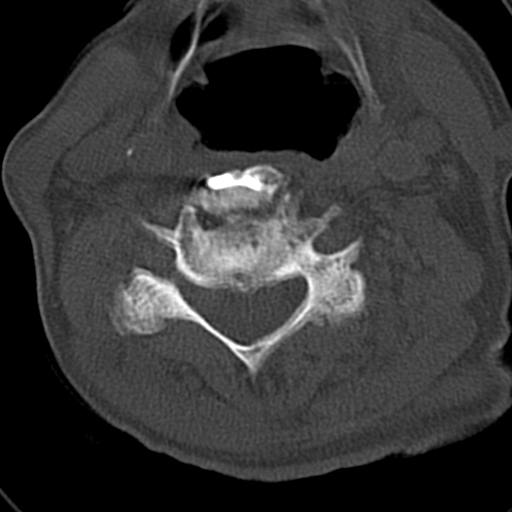
[im 105/105  bone]
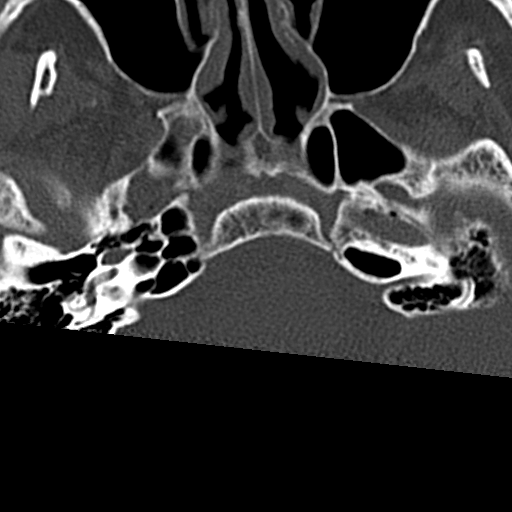

[Series 13: coronal soft · coronal · 0.34mm/px · 3 of 70 slices shown]
[im 14/70  bone]
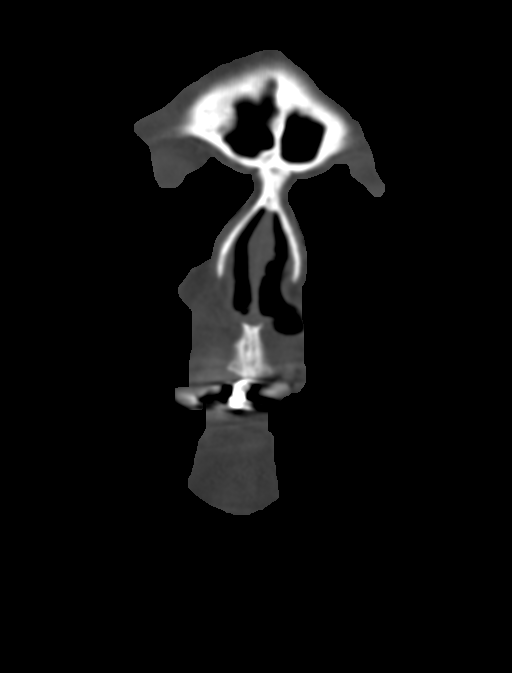
[im 28/70  bone]
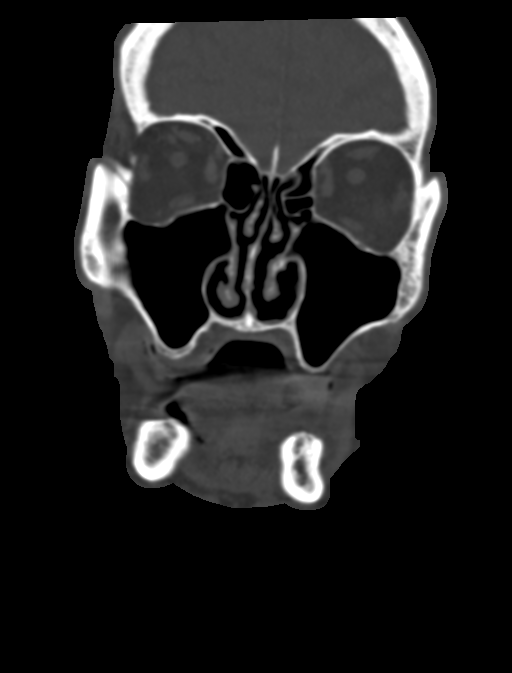
[im 42/70  bone]
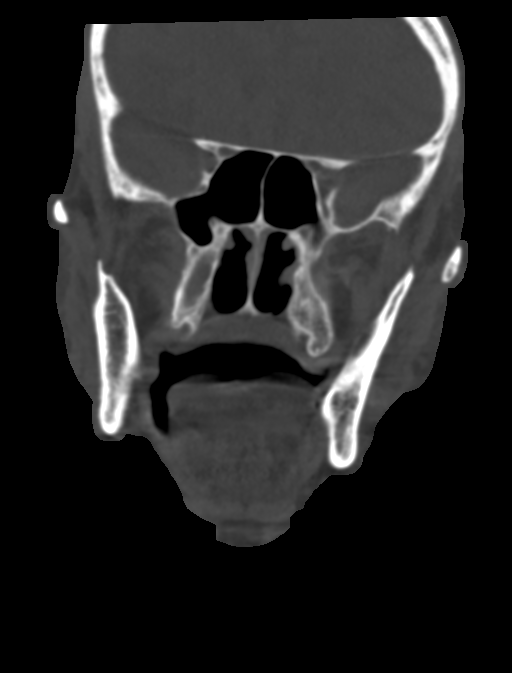

[10 of 34 positions shown; findings below may reference images not displayed]

FINDINGS: CT MAXILLOFACIAL FINDINGS

Osseous: Second medic arches, nasal bone, and mandibles are intact.
Nasal septum is midline. Temporomandibular joints are congruent.
Patient appears edentulous with a possible single central upper
incisor.

Orbits: Both orbits and globes are intact. No orbital fracture.
Right cataract resection.

Sinuses: No sinus fracture or fluid level. Mastoid air cells are
clear. No mucosal thickening.

Soft tissues: Right supraorbital soft tissue hematoma. Small left
parotid nodules are likely intraparotid lymph nodes.

Limited intracranial: Assessed on concurrent head CT, reported
separately.

CT CERVICAL FINDINGS

Mild motion artifact limitations.

Alignment: Reversal of normal cervical lordosis. Mild broad-based
dextroscoliotic curvature of the spine. No jumped or perched facets.
No traumatic subluxation.

Skull base and vertebrae: Anterior C3-C4 fusion. Hardware is grossly
intact, motion artifact through this region. No evidence of acute
fracture. The dens and skull base are intact. Degenerative change at
C1-C2 with bony fragmentation.

Soft tissues and spinal canal: No prevertebral fluid or swelling. No
visible canal hematoma.

Disc levels: Anterior C3-C4 fusion. Diffuse disc space narrowing and
endplate spurring. Multilevel facet hypertrophy.

Upper chest: Biapical pleuroparenchymal scarring. Emphysema. 6 mm
right apical pulmonary nodule, series 3, image 102 only partially
included in field of view. This appears present on 05/10/2011 neck
CT and is considered benign.

Other: Carotid and subclavian atherosclerosis.
IMPRESSION: CT face:

Right supraorbital soft tissue hematoma. No orbital or facial bone
fracture.

CT cervical spine:

Postsurgical and degenerative change in the cervical spine without
acute fracture or subluxation.
